# Patient Record
Sex: Male | Born: 1935
Health system: Southern US, Community
[De-identification: ages and names within clinical notes are randomized; demographics above are authoritative.]

## PROBLEM LIST (undated history)

## (undated) DIAGNOSIS — Z9289 Personal history of other medical treatment: Secondary | ICD-10-CM

## (undated) DIAGNOSIS — H3563 Retinal hemorrhage, bilateral: Secondary | ICD-10-CM

## (undated) DIAGNOSIS — I5032 Chronic diastolic (congestive) heart failure: Secondary | ICD-10-CM

## (undated) DIAGNOSIS — C61 Malignant neoplasm of prostate: Secondary | ICD-10-CM

## (undated) DIAGNOSIS — H269 Unspecified cataract: Secondary | ICD-10-CM

## (undated) DIAGNOSIS — N183 Chronic kidney disease, stage 3 unspecified: Secondary | ICD-10-CM

## (undated) DIAGNOSIS — N3040 Irradiation cystitis without hematuria: Secondary | ICD-10-CM

## (undated) DIAGNOSIS — I4821 Permanent atrial fibrillation: Secondary | ICD-10-CM

## (undated) DIAGNOSIS — M1990 Unspecified osteoarthritis, unspecified site: Secondary | ICD-10-CM

## (undated) DIAGNOSIS — I10 Essential (primary) hypertension: Secondary | ICD-10-CM

## (undated) DIAGNOSIS — I82409 Acute embolism and thrombosis of unspecified deep veins of unspecified lower extremity: Secondary | ICD-10-CM

## (undated) DIAGNOSIS — R58 Hemorrhage, not elsewhere classified: Secondary | ICD-10-CM

## (undated) DIAGNOSIS — J189 Pneumonia, unspecified organism: Secondary | ICD-10-CM

## (undated) DIAGNOSIS — D649 Anemia, unspecified: Secondary | ICD-10-CM

## (undated) HISTORY — DX: Chronic diastolic (congestive) heart failure: I50.32

## (undated) HISTORY — PX: TONSILLECTOMY: SUR1361

## (undated) HISTORY — PX: INGUINAL HERNIA REPAIR: SUR1180

## (undated) HISTORY — DX: Permanent atrial fibrillation: I48.21

## (undated) HISTORY — DX: Retinal hemorrhage, bilateral: H35.63

## (undated) HISTORY — PX: PROSTATECTOMY: SHX69

## (undated) HISTORY — DX: Unspecified osteoarthritis, unspecified site: M19.90

## (undated) HISTORY — PX: FRACTURE SURGERY: SHX138

## (undated) HISTORY — DX: Unspecified cataract: H26.9

## (undated) HISTORY — DX: Essential (primary) hypertension: I10

## (undated) HISTORY — DX: Irradiation cystitis without hematuria: N30.40

---

## 2012-06-18 HISTORY — PX: TIBIA FRACTURE SURGERY: SHX806

## 2013-09-29 DIAGNOSIS — D61818 Other pancytopenia: Secondary | ICD-10-CM | POA: Diagnosis not present

## 2013-09-29 DIAGNOSIS — Z79899 Other long term (current) drug therapy: Secondary | ICD-10-CM | POA: Diagnosis not present

## 2013-09-29 DIAGNOSIS — Z5181 Encounter for therapeutic drug level monitoring: Secondary | ICD-10-CM | POA: Diagnosis not present

## 2013-10-06 DIAGNOSIS — E559 Vitamin D deficiency, unspecified: Secondary | ICD-10-CM | POA: Diagnosis not present

## 2013-10-06 DIAGNOSIS — I10 Essential (primary) hypertension: Secondary | ICD-10-CM | POA: Diagnosis not present

## 2013-10-06 DIAGNOSIS — E7800 Pure hypercholesterolemia, unspecified: Secondary | ICD-10-CM | POA: Diagnosis not present

## 2013-10-06 DIAGNOSIS — R5383 Other fatigue: Secondary | ICD-10-CM | POA: Diagnosis not present

## 2013-10-06 DIAGNOSIS — R110 Nausea: Secondary | ICD-10-CM | POA: Diagnosis not present

## 2013-10-06 DIAGNOSIS — E538 Deficiency of other specified B group vitamins: Secondary | ICD-10-CM | POA: Diagnosis not present

## 2013-10-06 DIAGNOSIS — R5381 Other malaise: Secondary | ICD-10-CM | POA: Diagnosis not present

## 2013-10-10 DIAGNOSIS — N179 Acute kidney failure, unspecified: Secondary | ICD-10-CM | POA: Diagnosis not present

## 2013-10-10 DIAGNOSIS — R7309 Other abnormal glucose: Secondary | ICD-10-CM | POA: Diagnosis not present

## 2013-10-18 DIAGNOSIS — N179 Acute kidney failure, unspecified: Secondary | ICD-10-CM | POA: Diagnosis not present

## 2013-10-28 DIAGNOSIS — E749 Disorder of carbohydrate metabolism, unspecified: Secondary | ICD-10-CM | POA: Diagnosis not present

## 2013-10-28 DIAGNOSIS — R7309 Other abnormal glucose: Secondary | ICD-10-CM | POA: Diagnosis not present

## 2013-10-28 DIAGNOSIS — I10 Essential (primary) hypertension: Secondary | ICD-10-CM | POA: Diagnosis not present

## 2013-10-28 DIAGNOSIS — N179 Acute kidney failure, unspecified: Secondary | ICD-10-CM | POA: Diagnosis not present

## 2013-11-15 DIAGNOSIS — C61 Malignant neoplasm of prostate: Secondary | ICD-10-CM | POA: Diagnosis not present

## 2013-11-30 DIAGNOSIS — Z23 Encounter for immunization: Secondary | ICD-10-CM | POA: Diagnosis not present

## 2013-11-30 DIAGNOSIS — N179 Acute kidney failure, unspecified: Secondary | ICD-10-CM | POA: Diagnosis not present

## 2013-11-30 DIAGNOSIS — IMO0001 Reserved for inherently not codable concepts without codable children: Secondary | ICD-10-CM | POA: Diagnosis not present

## 2014-02-03 DIAGNOSIS — I10 Essential (primary) hypertension: Secondary | ICD-10-CM | POA: Diagnosis not present

## 2014-02-03 DIAGNOSIS — E7800 Pure hypercholesterolemia, unspecified: Secondary | ICD-10-CM | POA: Diagnosis not present

## 2014-02-03 DIAGNOSIS — E749 Disorder of carbohydrate metabolism, unspecified: Secondary | ICD-10-CM | POA: Diagnosis not present

## 2014-02-03 DIAGNOSIS — R7309 Other abnormal glucose: Secondary | ICD-10-CM | POA: Diagnosis not present

## 2014-02-03 DIAGNOSIS — R7989 Other specified abnormal findings of blood chemistry: Secondary | ICD-10-CM | POA: Diagnosis not present

## 2014-03-15 DIAGNOSIS — C61 Malignant neoplasm of prostate: Secondary | ICD-10-CM | POA: Diagnosis not present

## 2014-03-20 DIAGNOSIS — I10 Essential (primary) hypertension: Secondary | ICD-10-CM | POA: Diagnosis not present

## 2014-04-07 DIAGNOSIS — I10 Essential (primary) hypertension: Secondary | ICD-10-CM | POA: Diagnosis not present

## 2014-04-10 DIAGNOSIS — N1330 Unspecified hydronephrosis: Secondary | ICD-10-CM | POA: Diagnosis not present

## 2014-04-28 DIAGNOSIS — I10 Essential (primary) hypertension: Secondary | ICD-10-CM | POA: Diagnosis not present

## 2014-05-31 DIAGNOSIS — R799 Abnormal finding of blood chemistry, unspecified: Secondary | ICD-10-CM | POA: Diagnosis not present

## 2014-05-31 DIAGNOSIS — E749 Disorder of carbohydrate metabolism, unspecified: Secondary | ICD-10-CM | POA: Diagnosis not present

## 2014-06-05 DIAGNOSIS — E780 Pure hypercholesterolemia: Secondary | ICD-10-CM | POA: Diagnosis not present

## 2014-06-06 DIAGNOSIS — Z23 Encounter for immunization: Secondary | ICD-10-CM | POA: Diagnosis not present

## 2014-06-12 DIAGNOSIS — I359 Nonrheumatic aortic valve disorder, unspecified: Secondary | ICD-10-CM | POA: Diagnosis not present

## 2014-07-06 DIAGNOSIS — H2513 Age-related nuclear cataract, bilateral: Secondary | ICD-10-CM | POA: Diagnosis not present

## 2014-07-20 DIAGNOSIS — C61 Malignant neoplasm of prostate: Secondary | ICD-10-CM | POA: Diagnosis not present

## 2014-10-09 DIAGNOSIS — D563 Thalassemia minor: Secondary | ICD-10-CM | POA: Diagnosis not present

## 2014-10-09 DIAGNOSIS — D61818 Other pancytopenia: Secondary | ICD-10-CM | POA: Diagnosis not present

## 2014-10-09 DIAGNOSIS — Z79899 Other long term (current) drug therapy: Secondary | ICD-10-CM | POA: Diagnosis not present

## 2014-12-04 DIAGNOSIS — E559 Vitamin D deficiency, unspecified: Secondary | ICD-10-CM | POA: Diagnosis not present

## 2014-12-04 DIAGNOSIS — E780 Pure hypercholesterolemia: Secondary | ICD-10-CM | POA: Diagnosis not present

## 2014-12-04 DIAGNOSIS — R7309 Other abnormal glucose: Secondary | ICD-10-CM | POA: Diagnosis not present

## 2014-12-04 DIAGNOSIS — N1330 Unspecified hydronephrosis: Secondary | ICD-10-CM | POA: Diagnosis not present

## 2014-12-05 DIAGNOSIS — N189 Chronic kidney disease, unspecified: Secondary | ICD-10-CM | POA: Diagnosis not present

## 2014-12-05 DIAGNOSIS — E780 Pure hypercholesterolemia: Secondary | ICD-10-CM | POA: Diagnosis not present

## 2014-12-05 DIAGNOSIS — C61 Malignant neoplasm of prostate: Secondary | ICD-10-CM | POA: Diagnosis not present

## 2014-12-22 DIAGNOSIS — C61 Malignant neoplasm of prostate: Secondary | ICD-10-CM | POA: Diagnosis not present

## 2015-01-01 DIAGNOSIS — H2513 Age-related nuclear cataract, bilateral: Secondary | ICD-10-CM | POA: Diagnosis not present

## 2015-02-12 DIAGNOSIS — I10 Essential (primary) hypertension: Secondary | ICD-10-CM | POA: Diagnosis not present

## 2015-05-06 DIAGNOSIS — Z23 Encounter for immunization: Secondary | ICD-10-CM | POA: Diagnosis not present

## 2015-07-19 DIAGNOSIS — C61 Malignant neoplasm of prostate: Secondary | ICD-10-CM | POA: Diagnosis not present

## 2015-09-25 ENCOUNTER — Encounter: Payer: Self-pay | Admitting: Internal Medicine

## 2015-09-25 ENCOUNTER — Ambulatory Visit (INDEPENDENT_AMBULATORY_CARE_PROVIDER_SITE_OTHER): Payer: Medicare Other | Admitting: Internal Medicine

## 2015-09-25 VITALS — BP 174/82 | HR 55 | Temp 97.7°F | Resp 16 | Ht 63.0 in | Wt 129.0 lb

## 2015-09-25 DIAGNOSIS — I10 Essential (primary) hypertension: Secondary | ICD-10-CM

## 2015-09-25 DIAGNOSIS — Z86718 Personal history of other venous thrombosis and embolism: Secondary | ICD-10-CM | POA: Diagnosis not present

## 2015-09-25 DIAGNOSIS — M4120 Other idiopathic scoliosis, site unspecified: Secondary | ICD-10-CM

## 2015-09-25 DIAGNOSIS — Z8546 Personal history of malignant neoplasm of prostate: Secondary | ICD-10-CM | POA: Diagnosis not present

## 2015-09-25 DIAGNOSIS — R011 Cardiac murmur, unspecified: Secondary | ICD-10-CM | POA: Insufficient documentation

## 2015-09-25 MED ORDER — LISINOPRIL 5 MG PO TABS: 5.0000 mg | ORAL_TABLET | Freq: Every day | ORAL | Status: DC

## 2015-09-25 MED ORDER — LUMBAR BACK BRACE/SUPPORT PAD MISC: Status: DC

## 2015-09-25 MED ORDER — AMLODIPINE BESYLATE 10 MG PO TABS: 10.0000 mg | ORAL_TABLET | Freq: Every day | ORAL | Status: DC

## 2015-09-25 NOTE — Patient Instructions (Addendum)
  It was nice to meet you.   A prescription was given for a back brace - you can take it to a medical supply store.  If you need a different type of prescription please let me know.   Medications reviewed and updated.  No changes recommended at this time.  Your prescription(s) have been submitted to your pharmacy. Please take as directed and contact our office if you believe you are having problem(s) with the medication(s).   Please schedule followup in 6 months

## 2015-09-25 NOTE — Progress Notes (Signed)
Subjective:    Patient ID: Johnny Morrow, male    DOB: [birth year], 82 y.o.   MRN: [id number]  HPI He is here to establish with a new pcp.    Scoliosis:  He has scoliosis in the mid-lower back.  It bothers him on occasion.  He wonders about if a back brace would help.  He only has occasional pain.  He typically works out regularly at [employer] and this does help, but still has occasional back pain and is looking for some relief. He understands he may not be able to use this regularly.  Hypertension: He is taking his medication daily. He is compliant with a low sodium diet.  He denies chest pain, palpitations, edema, shortness of breath and regular headaches. He is exercising regularly.  He does monitor his blood pressure at home and his blood pressure is typically well controlled. He had a long drive this morning and he believes that's why his blood pressure is elevated..     Medications and allergies reviewed with patient and updated if appropriate.  Patient Active Problem List   Diagnosis Date Noted  . Essential hypertension, benign 09/25/2015  . History of prostate cancer 09/25/2015  . Idiopathic scoliosis, thoracic 09/25/2015  . History of DVT of lower extremity, left 09/25/2015  . Undiagnosed cardiac murmurs 09/25/2015    No current outpatient prescriptions on file prior to visit.   No current facility-administered medications on file prior to visit.    Past Medical History  Diagnosis Date  . Cancer ([place])   . Hypertension     Past Surgical History  Procedure Laterality Date  . Hernia repair      Social History   Social History  . Marital Status: Married    Spouse Name: N/A  . Number of Children: N/A  . Years of Education: N/A   Social History Main Topics  . Smoking status: Former Research scientist (life sciences)  . Smokeless tobacco: Never Used  . Alcohol Use: Yes  . Drug Use: No  . Sexual Activity: Not Asked   Other Topics Concern  . None   Social History Narrative  . None     Family History  Problem Relation Age of Onset  . Cancer Father     Review of Systems  Constitutional: Negative for fever and chills.  HENT: Negative for hearing loss.   Eyes: Negative for visual disturbance.  Respiratory: Negative for cough, shortness of breath and wheezing.   Cardiovascular: Negative for chest pain, palpitations and leg swelling.  Gastrointestinal: Negative for nausea, abdominal pain, diarrhea, constipation and blood in stool.  Musculoskeletal: Positive for back pain (occasional).  Neurological: Negative for dizziness, light-headedness and headaches.  Psychiatric/Behavioral: Negative for dysphoric mood. The patient is not nervous/anxious.        Objective:   Filed Vitals:   09/25/15 0844  BP: 174/82  Pulse: 55  Temp: 97.7 F (36.5 C)  Resp: 16   Filed Weights   09/25/15 0844  Weight: 129 lb (58.514 kg)   Body mass index is 22.86 kg/(m^2).   Physical Exam Constitutional: He appears well-developed and well-nourished. No distress.  HENT:  Head: Normocephalic and atraumatic.  Right Ear: External ear normal.  Left Ear: External ear normal.  Mouth/Throat: Oropharynx is clear and moist.  Excessive wax left ear canal, TM unable to visualized, right ear canal and TM normal Eyes: Conjunctivae and EOM are normal.  Neck: Neck supple. No tracheal deviation present. No thyromegaly present.  No carotid bruit  Cardiovascular: Normal rate, regular rhythm, normal heart sounds and intact distal pulses.   3/6 systolic murmur and 2/6 diastolic murmur. Pulmonary/Chest: Effort normal and breath sounds normal. No respiratory distress. He has no wheezes. He has no rales.  Abdominal: Soft. Bowel sounds are normal. He exhibits no distension. There is no tenderness.  Genitourinary:  deferred  Musculoskeletal: He exhibits no edema. thoracic spine scoliosis Lymphadenopathy:    He has no cervical adenopathy.  Skin: Skin is warm and dry. He is not diaphoretic.   Psychiatric: He has a normal mood and affect. His behavior is normal.      Assessment & Plan:   See Problem List for Assessment and Plan of chronic medical problems.  Follow-up in 6 months, sooner if needed

## 2015-09-25 NOTE — Assessment & Plan Note (Signed)
We'll request his previous medical records-he states he has had an echocardiogram in the past

## 2015-09-25 NOTE — Assessment & Plan Note (Signed)
Thoracic scoliosis on exam He states his lower back pain is in the lumbar region Given his age and a back brace would be okay as long as he uses it periodically-he willnot use this on a regular basis because it may weaken his back muscles

## 2015-09-25 NOTE — Assessment & Plan Note (Signed)
Blood pressure typically controlled per patient We'll continue current medication-prescription sent to pharmacy Stressed that he needs to monitor his blood pressure and confirmed that it is well controlled

## 2015-09-25 NOTE — Progress Notes (Signed)
Pre visit review using our clinic review tool, if applicable. No additional management support is needed unless otherwise documented below in the visit note. 

## 2015-09-25 NOTE — Assessment & Plan Note (Signed)
Following with urology

## 2015-09-25 NOTE — Assessment & Plan Note (Signed)
Wears compression stockings daily

## 2015-10-11 DIAGNOSIS — I10 Essential (primary) hypertension: Secondary | ICD-10-CM | POA: Diagnosis not present

## 2015-10-11 DIAGNOSIS — Z7901 Long term (current) use of anticoagulants: Secondary | ICD-10-CM | POA: Diagnosis not present

## 2015-10-11 DIAGNOSIS — R799 Abnormal finding of blood chemistry, unspecified: Secondary | ICD-10-CM | POA: Diagnosis not present

## 2015-10-11 DIAGNOSIS — D72819 Decreased white blood cell count, unspecified: Secondary | ICD-10-CM | POA: Diagnosis not present

## 2015-10-11 DIAGNOSIS — N1330 Unspecified hydronephrosis: Secondary | ICD-10-CM | POA: Diagnosis not present

## 2015-10-11 DIAGNOSIS — Z8546 Personal history of malignant neoplasm of prostate: Secondary | ICD-10-CM | POA: Diagnosis not present

## 2015-10-11 DIAGNOSIS — D61818 Other pancytopenia: Secondary | ICD-10-CM | POA: Diagnosis not present

## 2015-10-22 ENCOUNTER — Telehealth: Payer: Self-pay | Admitting: Internal Medicine

## 2015-10-22 NOTE — Telephone Encounter (Signed)
Rec'd from [facility] forward 63 pages to Dr. Quay Burow

## 2015-12-05 DIAGNOSIS — C61 Malignant neoplasm of prostate: Secondary | ICD-10-CM | POA: Diagnosis not present

## 2015-12-12 ENCOUNTER — Ambulatory Visit (INDEPENDENT_AMBULATORY_CARE_PROVIDER_SITE_OTHER): Payer: Medicare Other | Admitting: Family Medicine

## 2015-12-12 ENCOUNTER — Encounter: Payer: Self-pay | Admitting: Family Medicine

## 2015-12-12 ENCOUNTER — Ambulatory Visit (INDEPENDENT_AMBULATORY_CARE_PROVIDER_SITE_OTHER)
Admission: RE | Admit: 2015-12-12 | Discharge: 2015-12-12 | Disposition: A | Discharge status: Home / Self Care | Payer: Medicare Other | Source: Ambulatory Visit | Attending: Family Medicine | Admitting: Family Medicine

## 2015-12-12 VITALS — BP 164/90 | HR 87 | Temp 97.8°F | Resp 12 | Wt 127.4 lb

## 2015-12-12 DIAGNOSIS — I10 Essential (primary) hypertension: Secondary | ICD-10-CM | POA: Diagnosis not present

## 2015-12-12 DIAGNOSIS — J309 Allergic rhinitis, unspecified: Secondary | ICD-10-CM

## 2015-12-12 DIAGNOSIS — R05 Cough: Secondary | ICD-10-CM | POA: Diagnosis not present

## 2015-12-12 DIAGNOSIS — I499 Cardiac arrhythmia, unspecified: Secondary | ICD-10-CM

## 2015-12-12 DIAGNOSIS — R059 Cough, unspecified: Secondary | ICD-10-CM

## 2015-12-12 LAB — BASIC METABOLIC PANEL
BUN: 37 mg/dL — ABNORMAL HIGH (ref 6–23)
CHLORIDE: 101 meq/L (ref 96–112)
CO2: 26 mEq/L (ref 19–32)
CREATININE: 2.18 mg/dL — AB (ref 0.40–1.50)
Calcium: 9.5 mg/dL (ref 8.4–10.5)
GFR: 36.45 mL/min — ABNORMAL LOW (ref >60.00)
Glucose, Bld: 132 mg/dL — ABNORMAL HIGH (ref 70–99)
POTASSIUM: 4 meq/L (ref 3.5–5.1)
SODIUM: 136 meq/L (ref 135–145)

## 2015-12-12 LAB — TSH: TSH: 2.53 u[IU]/mL (ref 0.35–4.50)

## 2015-12-12 MED ORDER — FLUTICASONE PROPIONATE 50 MCG/ACT NA SUSP
1.0000 | Freq: Two times a day (BID) | NASAL | Status: DC
Start: 2015-12-12 — End: 2016-03-25

## 2015-12-12 NOTE — Progress Notes (Addendum)
Subjective:    Patient ID: Johnny Morrow, male    DOB: Nov 01, 1919, 82 y.o.   MRN: [id number]  HPI  Mr. Johnny Morrow is a 82 y.o.male here today with his wife complaining of 3 weeks of cough, denies any other respiratory symptom. He has not had recent URI, fever, chills, or myalgias. + Former smoker, has not noted wheezing or dyspnea. Cough is described as non productive, intermittent, he has not identified exacerbating factors.  No Hx of recent travel. No sick contact. No known insect bite. + Hx of allergies,tried OTC antihistaminic for a few days and helped some. No Hx of GERD and has not noted heartburn. Has been on ACEI for about 2 years, Lisinopril.   He has tried honey and lemon tea. Symptom getting worse.   Hypertension: His BP is elevated today. Currently he is on Amlodipine 10 mg and Lisinopril 5 mg. He has not taken his medications today.   He has not noted unusual headache, visual changes, exertional chest pain, dyspnea,  focal weakness, or edema. Denies orthopnea, sleeps on a pillow; or PND.  Hx of "heart murmur", reporting cardiology evaluation 1-2 years ago. No Hx of irregular HR, which I noted today on exam.   Review of Systems  Constitutional: Negative for fever, chills, diaphoresis, activity change, appetite change and fatigue.  HENT: Positive for congestion and rhinorrhea. Negative for ear pain, mouth sores, nosebleeds, postnasal drip, sneezing, sore throat, trouble swallowing and voice change.   Eyes: Negative for discharge, redness, itching and visual disturbance.  Respiratory: Positive for cough. Negative for chest tightness, shortness of breath and wheezing.   Cardiovascular: Negative for chest pain, palpitations and leg swelling.  Gastrointestinal: Negative for nausea, vomiting and abdominal pain.       No changes in bowel habits.  Endocrine: Negative for cold intolerance and heat intolerance.  Genitourinary: Negative for hematuria and decreased urine  volume.       Ocassional nocturia, attributed to increased fluid intake lately.  Musculoskeletal: Negative for back pain, gait problem and neck pain.  Skin: Negative for rash.  Allergic/Immunologic: Positive for environmental allergies.  Neurological: Negative for syncope, speech difficulty, weakness, numbness and headaches.  Psychiatric/Behavioral: Negative for confusion. The patient is not nervous/anxious.      Current Outpatient Prescriptions on File Prior to Visit  Medication Sig Dispense Refill  . amLODipine (NORVASC) 10 MG tablet Take 1 tablet (10 mg total) by mouth daily. 90 tablet 3  . B Complex Vitamins (VITAMIN-B COMPLEX PO) Take by mouth.    . Bacillus Coagulans-Inulin (PROBIOTIC FORMULA) 1-250 BILLION-MG CAPS Take 250 mg by mouth daily.    . Cholecalciferol (VITAMIN D3) 5000 units TABS Take by mouth daily.    . Coenzyme Q10 (CO Q 10) 100 MG CAPS Take 100 mg by mouth daily.    Water engineer Bandages & Supports (LUMBAR BACK BRACE/SUPPORT PAD) MISC Use as directed for lumbar scoliosis 1 each 0  . lisinopril (PRINIVIL,ZESTRIL) 5 MG tablet Take 1 tablet (5 mg total) by mouth daily. 90 tablet 3  . Multiple Vitamin (MULTI VITAMIN PO) Take by mouth daily.    . Omega 3 1000 MG CAPS Take 2,000 mg by mouth daily.     No current facility-administered medications on file prior to visit.     Past Medical History  Diagnosis Date  . Cancer ([place])   . Hypertension     Social History   Social History  . Marital Status: Married    Spouse Name: N/A  .  Number of Children: N/A  . Years of Education: N/A   Social History Main Topics  . Smoking status: Former Research scientist (life sciences)  . Smokeless tobacco: Never Used  . Alcohol Use: Yes  . Drug Use: No  . Sexual Activity: Not Asked   Other Topics Concern  . None   Social History Narrative    Filed Vitals:   12/12/15 1108  BP: 164/90  Pulse: 87  Temp: 97.8 F (36.6 C)  Resp: 12   Body mass index is 22.57 kg/(m^2).  SpO2 Readings from Last 3  Encounters:  12/12/15 96%  09/25/15 97%        Objective:   Physical Exam  Constitutional: He is oriented to person, place, and time. He appears well-developed and well-nourished. He does not appear ill. No distress.  HENT:  Head: Normocephalic and atraumatic.  Left Ear: Tympanic membrane and ear canal normal.  Nose: Rhinorrhea and septal deviation present. No sinus tenderness.  Right ear: excess cerumen, cannot visualized TM. Hypertrophic turbinates, nasal voice.  Eyes: Conjunctivae and EOM are normal. Pupils are equal, round, and reactive to light.  Neck: Hepatojugular reflux present. No JVD present.  Cardiovascular: Normal rate.  An irregular rhythm present.  Occasional extrasystoles are present.  Murmur heard. SEM II-III/VI RUSB and apex. PT pulses present bilateral. Peri ankle pitting edema, trace, bilateral.  Pulmonary/Chest: Effort normal. No respiratory distress. He has no decreased breath sounds. He has no wheezes. He has no rales.  Abdominal: Soft. He exhibits no mass. There is no tenderness.  Musculoskeletal: He exhibits edema (peri ankle, trace). He exhibits no tenderness.  Lymphadenopathy:    He has no cervical adenopathy.  Neurological: He is alert and oriented to person, place, and time. He has normal strength. No cranial nerve deficit. Coordination normal.  Slow otherwise stable gait but some unbalance noted when he first gets up; no assistance.  Skin: Skin is warm. No rash noted. No erythema.  Psychiatric: He has a normal mood and affect. His speech is normal.  Well groomed and good eye contact.  Nursing note and vitals reviewed.     Assessment & Plan:    Favor was seen today for cough.  Diagnoses and all orders for this visit:  Cough  Possible causes discussed. For now no changes in Lisinopril but may need to be discontinued if cough persists. I will treat as allergic etiology initially. Because 3 weeks of cough CXR was ordered.  -     DG Chest 2  View; Future  Essential hypertension, benign  Re-checked 158/92. Reporting home readings 150/80. BP was higher during his last OV. Since he did not take his BP meds today no changes. Possible complications of elevated BP discussed. Clearly instructed about warning signs. F/U in 2 weeks.  -     Basic Metabolic Panel -     Electrocardiogram report -     EKG 12-Lead  Allergic rhinitis, unspecified allergic rhinitis type  Plain Allegra 180 mg daily and Flonase nasal spray recommended. This could be causing and/or aggravating problem.  -     fluticasone (FLONASE) 50 MCG/ACT nasal spray; Place 1 spray into both nostrils 2 (two) times daily.  Irregular heart rate  Reporting no prior Hx. EKG done today, abnormal.Reviewed with Ms and Mr Pascall. Sine he is asymptomatic otherwise I do not think he needs further test today, except for lab work. Reporting Echo done about a year ago. EKG: SR, occasional PAC (1), voltage criteria for LVH with straining pattern, ?  LAE.  RBBB with LAD (possible LAHB). No other EKG available for comparison. Instructed about warning signs, Ms and Mr Steelman voice understanding. F/U in 2 weeks with PCP.  -     Electrocardiogram report -     TSH -     EKG 12-Lead   Lab Results  Component Value Date   CREATININE 2.18* 12/12/2015   BUN 37* 12/12/2015   NA 136 12/12/2015   K 4.0 12/12/2015   CL 101 12/12/2015   CO2 26 12/12/2015   Lab Results  Component Value Date   TSH 2.53 12/12/2015     -Patient advised to return or notify a doctor immediately if symptoms worsen or persist or new concerns arise.    Addendum: I reviewed some records after OV: EKG in 01/2003 NSR, RBBB,LVH,and ? LAE. 01/2003 Echo: normal LVEF, no LVH,mild AR and TR, trivial MR and PR. 09/2012 CMP: GLU 172,Cr 1.2, and AST 50; rest otherwise normal.    Abbye Lao G. Martinique, MD  [facility]. [place] office.

## 2015-12-12 NOTE — Patient Instructions (Addendum)
A few things to remember from today's visit:   1. Essential hypertension, benign  Poorly controlled.  2. Cough Possible causes: cold, allergies, acid reflux, COPD, heart and lung disease, and medications like Lisinopril.   3. Allergic rhinitis, unspecified allergic rhinitis type I will treat as allergies. Allegra 180 mg daily (plain) and Flonase.   Irregular heart Rate: EKG done today is abnormal but base on history today I do not think it is something acute going on, still you need to go to ER if any chest pain, shortness or breath, dizziness, confusion, or palpitations.  Follow with you pcp in 2 weeks.      If you sign-up for My chart, you can communicate easier with [place] in case you have any question or concern.

## 2015-12-18 ENCOUNTER — Other Ambulatory Visit (INDEPENDENT_AMBULATORY_CARE_PROVIDER_SITE_OTHER): Payer: Medicare Other

## 2015-12-18 DIAGNOSIS — E119 Type 2 diabetes mellitus without complications: Secondary | ICD-10-CM

## 2015-12-18 LAB — HEMOGLOBIN A1C: HEMOGLOBIN A1C: 6.2 % (ref 4.6–6.5)

## 2015-12-19 ENCOUNTER — Encounter: Payer: Self-pay | Admitting: Internal Medicine

## 2015-12-19 DIAGNOSIS — R7303 Prediabetes: Secondary | ICD-10-CM | POA: Insufficient documentation

## 2015-12-20 ENCOUNTER — Encounter: Payer: Self-pay | Admitting: Emergency Medicine

## 2015-12-24 ENCOUNTER — Other Ambulatory Visit: Payer: Self-pay | Admitting: Family Medicine

## 2015-12-24 ENCOUNTER — Encounter: Payer: Self-pay | Admitting: Family Medicine

## 2015-12-24 ENCOUNTER — Ambulatory Visit (INDEPENDENT_AMBULATORY_CARE_PROVIDER_SITE_OTHER): Payer: Medicare Other | Admitting: Family Medicine

## 2015-12-24 ENCOUNTER — Telehealth: Payer: Self-pay | Admitting: Internal Medicine

## 2015-12-24 VITALS — BP 130/80 | HR 86 | Temp 98.1°F | Resp 12 | Ht 63.0 in | Wt 125.5 lb

## 2015-12-24 DIAGNOSIS — N289 Disorder of kidney and ureter, unspecified: Secondary | ICD-10-CM | POA: Diagnosis not present

## 2015-12-24 DIAGNOSIS — J309 Allergic rhinitis, unspecified: Secondary | ICD-10-CM | POA: Diagnosis not present

## 2015-12-24 DIAGNOSIS — I351 Nonrheumatic aortic (valve) insufficiency: Secondary | ICD-10-CM

## 2015-12-24 LAB — BASIC METABOLIC PANEL
BUN: 46 mg/dL — ABNORMAL HIGH (ref 6–23)
CHLORIDE: 105 meq/L (ref 96–112)
CO2: 25 meq/L (ref 19–32)
Calcium: 9.4 mg/dL (ref 8.4–10.5)
Creatinine, Ser: 2.29 mg/dL — ABNORMAL HIGH (ref 0.40–1.50)
GFR: 34.43 mL/min — ABNORMAL LOW (ref >60.00)
GLUCOSE: 122 mg/dL — AB (ref 70–99)
Potassium: 3.9 mEq/L (ref 3.5–5.1)
SODIUM: 138 meq/L (ref 135–145)

## 2015-12-24 LAB — MICROALBUMIN / CREATININE URINE RATIO
CREATININE, U: 155.6 mg/dL
MICROALB UR: 167.3 mg/dL — AB (ref 0.0–1.9)
MICROALB/CREAT RATIO: 107.5 mg/g — AB (ref 0.0–30.0)

## 2015-12-24 NOTE — Patient Instructions (Signed)
A few things to remember from today's visit:   1. Abnormal renal function Lab today. Low salt diet. To preserve kidney function and/or slow progression of disease:  Low salt diet and adequate hydration. Avoiding medicines known as "nonsteroidal anti-inflammatory drugs," or NSAIDs. These medicines include ibuprofen (sample brand names: Advil, Motrin) and naproxen (sample brand name: Aleve).  Adequate blood pressure control.    - Basic Metabolic Panel - Microalbumin/Creatinine Ratio, Urine  2. Allergic rhinitis, unspecified allergic rhinitis type Continue Allegra and Flonase   3. Aortic valve regurgitation Echo to be arrange. No changes in blood pressure medication.  - Echocardiogram; Future      If you sign-up for My chart, you can communicate easier with [place] in case you have any question or concern.

## 2015-12-24 NOTE — Telephone Encounter (Signed)
Pt was holding and got disconnected not sure if he was to make an appointment for Dr. Martinique or lab appt.   Pt would like a call back.

## 2015-12-24 NOTE — Telephone Encounter (Signed)
Patient should expect call back momentarily from scheduling to schedule appointment

## 2015-12-24 NOTE — Progress Notes (Signed)
Pre visit review using our clinic review tool, if applicable. No additional management support is needed unless otherwise documented below in the visit note. 

## 2015-12-24 NOTE — Progress Notes (Signed)
Subjective:    Patient ID: Johnny Morrow, male    DOB: Nov 27, 1919, 82 y.o.   MRN: [id number]  HPI   Mr. Johnny Morrow is a 82 y.o.male here today with his wife to follow on his last office visit, he would like to go through the lab results and follow on cough. He was seen on April 26/2017 concerned about persistent cough, we discussed possible etiologies and treated as allergy related. Currently he is on Allegra 180 mg daily and Flonase nasal spray, he is reporting improvement of cough, about 60%.  Also during his last office visit I found irregular heart rate, not aware of prior Hx ,EKG and labs done. RBBB,voltage criteria for LVH with straining pattern, and PAC's. BMP abnormal.  His blood pressure is better today, currently On amlodipine 10 mg daily. He is not aware of hx of chronic kidney disease. He denies any foam in the urine, gross hematuria, or decreased urine output.  Denies severe/frequent headache, visual changes, chest pain, dyspnea, palpitation, claudication, focal weakness, or edema.  Today he is reporting that he has followed with cardiologists, last in 2014.  CXR done 12/12/15  1. Severe dextroscoliosis of the thoracolumbar spine. 2. Mild bibasilar curvilinear lung opacities, favor scarring or Atelectasis.   Lab Results  Component Value Date   CREATININE 2.18* 12/12/2015   BUN 37* 12/12/2015   NA 136 12/12/2015   K 4.0 12/12/2015   CL 101 12/12/2015   CO2 26 12/12/2015     Review of Systems  Constitutional: Negative for fever, chills, activity change, appetite change and fatigue.  HENT: Negative for congestion, ear pain, mouth sores, postnasal drip, sneezing, sore throat, trouble swallowing and voice change.   Eyes: Positive for discharge (epiphora). Negative for redness, itching and visual disturbance.  Respiratory: Positive for cough (improved). Negative for chest tightness, shortness of breath and wheezing.   Cardiovascular: Negative for chest pain,  palpitations and leg swelling.  Gastrointestinal: Negative for nausea, vomiting and abdominal pain.       No changes in bowel habits.   Genitourinary: Negative for dysuria, hematuria and decreased urine volume.  Allergic/Immunologic: Positive for environmental allergies.  Neurological: Negative for dizziness, weakness, numbness and headaches.  Psychiatric/Behavioral: Negative for confusion and sleep disturbance. The patient is not nervous/anxious.      Current Outpatient Prescriptions on File Prior to Visit  Medication Sig Dispense Refill  . amLODipine (NORVASC) 10 MG tablet Take 1 tablet (10 mg total) by mouth daily. 90 tablet 3  . B Complex Vitamins (VITAMIN-B COMPLEX PO) Take by mouth.    . Bacillus Coagulans-Inulin (PROBIOTIC FORMULA) 1-250 BILLION-MG CAPS Take 250 mg by mouth daily.    . Cholecalciferol (VITAMIN D3) 5000 units TABS Take by mouth daily.    . Coenzyme Q10 (CO Q 10) 100 MG CAPS Take 100 mg by mouth daily.    Water engineer Bandages & Supports (LUMBAR BACK BRACE/SUPPORT PAD) MISC Use as directed for lumbar scoliosis 1 each 0  . fluticasone (FLONASE) 50 MCG/ACT nasal spray Place 1 spray into both nostrils 2 (two) times daily. 16 g 3  . lisinopril (PRINIVIL,ZESTRIL) 5 MG tablet Take 1 tablet (5 mg total) by mouth daily. 90 tablet 3  . Multiple Vitamin (MULTI VITAMIN PO) Take by mouth daily.    . Omega 3 1000 MG CAPS Take 2,000 mg by mouth daily.     No current facility-administered medications on file prior to visit.     Past Medical History  Diagnosis Date  .  Cancer ([place])   . Hypertension     Social History   Social History  . Marital Status: Married    Spouse Name: N/A  . Number of Children: N/A  . Years of Education: N/A   Social History Main Topics  . Smoking status: Former Research scientist (life sciences)  . Smokeless tobacco: Never Used  . Alcohol Use: Yes  . Drug Use: No  . Sexual Activity: Not Asked   Other Topics Concern  . None   Social History Narrative    Filed  Vitals:   12/24/15 0817  BP: 130/80  Pulse: 86  Temp: 98.1 F (36.7 C)  Resp: 12   Body mass index is 22.24 kg/(m^2).      Objective:   Physical Exam  Constitutional: He is oriented to person, place, and time. He appears well-developed and well-nourished. No distress.  HENT:  Head: Atraumatic.  Nose: Rhinorrhea and septal deviation present. Right sinus exhibits no maxillary sinus tenderness and no frontal sinus tenderness. Left sinus exhibits no maxillary sinus tenderness and no frontal sinus tenderness.  Mouth/Throat: Oropharynx is clear and moist and mucous membranes are normal.  Eyes: Conjunctivae are normal.  Cardiovascular: Normal rate.  An irregularly irregular rhythm present.  Extrasystoles (X 1) are present.  Murmur heard. SEM II-VI louder on RUSB but also heard on LUSB and apex.  Pulmonary/Chest: Effort normal and breath sounds normal. He has no wheezes. He has no rales.  Abdominal: Soft. He exhibits no mass. There is no tenderness.  Musculoskeletal: He exhibits no edema.  Lymphadenopathy:    He has no cervical adenopathy.  Neurological: He is alert and oriented to person, place, and time. He has normal strength. Coordination normal.  Stable gait with no assistance.  Skin: Skin is warm. No erythema.  Psychiatric: He has a normal mood and affect. His speech is normal.  Well groomed and good eye contact.  Nursing note and vitals reviewed.      Assessment & Plan:   Johnny Morrow was seen today for follow-up.  Diagnoses and all orders for this visit:  Abnormal renal function  ? CKD III. Continue lisinopril. Good blood pressure control, avoid diabetes (history of prediabetes). Low salt diet and avoid nonsteroidal anti-inflammatory medications. Further recommendations would be given depending on results of today's labs. Follow-up in 3 months.  -     Basic Metabolic Panel -     Microalbumin/Creatinine Ratio, Urine   Allergic rhinitis, unspecified allergic rhinitis  type  Cough has improved, most likely related with allergies. Continue Allegra and Flonase. Nasal saline drops at night might also help. Follow-up as needed.   Aortic valve regurgitation Asymptomatic. Echo will be arranged to follow on valvular abnormalities (AR,MR). Further recommendations will be given according to results.  -     Echocardiogram; Future      -Patient advised to return or notify a doctor immediately if symptoms worsen or persist or new concerns arise.     Latalia Etzler G. Martinique, MD  [facility]. [place] office.

## 2015-12-26 ENCOUNTER — Other Ambulatory Visit: Payer: Self-pay | Admitting: Family Medicine

## 2015-12-26 DIAGNOSIS — N289 Disorder of kidney and ureter, unspecified: Secondary | ICD-10-CM

## 2016-01-11 ENCOUNTER — Encounter: Payer: Self-pay | Admitting: Internal Medicine

## 2016-01-11 ENCOUNTER — Other Ambulatory Visit: Payer: Self-pay

## 2016-01-11 ENCOUNTER — Ambulatory Visit (HOSPITAL_COMMUNITY): Payer: Medicare Other | Attending: Family Medicine

## 2016-01-11 DIAGNOSIS — I371 Nonrheumatic pulmonary valve insufficiency: Secondary | ICD-10-CM | POA: Diagnosis not present

## 2016-01-11 DIAGNOSIS — I071 Rheumatic tricuspid insufficiency: Secondary | ICD-10-CM | POA: Diagnosis not present

## 2016-01-11 DIAGNOSIS — I119 Hypertensive heart disease without heart failure: Secondary | ICD-10-CM | POA: Diagnosis not present

## 2016-01-11 DIAGNOSIS — I352 Nonrheumatic aortic (valve) stenosis with insufficiency: Secondary | ICD-10-CM | POA: Diagnosis not present

## 2016-01-11 DIAGNOSIS — I340 Nonrheumatic mitral (valve) insufficiency: Secondary | ICD-10-CM | POA: Diagnosis not present

## 2016-01-11 DIAGNOSIS — I351 Nonrheumatic aortic (valve) insufficiency: Secondary | ICD-10-CM | POA: Insufficient documentation

## 2016-01-11 DIAGNOSIS — R012 Other cardiac sounds: Secondary | ICD-10-CM | POA: Diagnosis present

## 2016-01-11 DIAGNOSIS — Z87891 Personal history of nicotine dependence: Secondary | ICD-10-CM | POA: Insufficient documentation

## 2016-01-11 DIAGNOSIS — I350 Nonrheumatic aortic (valve) stenosis: Secondary | ICD-10-CM | POA: Insufficient documentation

## 2016-01-11 DIAGNOSIS — M810 Age-related osteoporosis without current pathological fracture: Secondary | ICD-10-CM | POA: Insufficient documentation

## 2016-01-21 ENCOUNTER — Other Ambulatory Visit (INDEPENDENT_AMBULATORY_CARE_PROVIDER_SITE_OTHER): Payer: Medicare Other

## 2016-01-21 ENCOUNTER — Telehealth: Payer: Self-pay

## 2016-01-21 DIAGNOSIS — N289 Disorder of kidney and ureter, unspecified: Secondary | ICD-10-CM | POA: Diagnosis not present

## 2016-01-21 LAB — CBC WITH DIFFERENTIAL/PLATELET
BASOS ABS: 0 10*3/uL (ref 0.0–0.1)
Basophils Relative: 0 % (ref 0.0–3.0)
Eosinophils Absolute: 0.2 10*3/uL (ref 0.0–0.7)
Eosinophils Relative: 5.1 % — ABNORMAL HIGH (ref 0.0–5.0)
HCT: 36.4 % — ABNORMAL LOW (ref 39.0–52.0)
Hemoglobin: 12 g/dL — ABNORMAL LOW (ref 13.0–17.0)
LYMPHS ABS: 1.6 10*3/uL (ref 0.7–4.0)
Lymphocytes Relative: 35.6 % (ref 12.0–46.0)
MCHC: 33 g/dL (ref 30.0–36.0)
MCV: 85.8 fl (ref 78.0–100.0)
MONO ABS: 0.6 10*3/uL (ref 0.1–1.0)
Monocytes Relative: 14 % — ABNORMAL HIGH (ref 3.0–12.0)
NEUTROS PCT: 45.3 % (ref 43.0–77.0)
Neutro Abs: 2.1 10*3/uL (ref 1.4–7.7)
Platelets: 151 10*3/uL (ref 150.0–400.0)
RBC: 4.24 Mil/uL (ref 4.22–5.81)
RDW: 15.6 % — ABNORMAL HIGH (ref 11.5–15.5)
WBC: 4.6 10*3/uL (ref 4.0–10.5)

## 2016-01-21 NOTE — Telephone Encounter (Signed)
Spoke to patient. Gave results. Patient verbalized understanding. 

## 2016-01-21 NOTE — Telephone Encounter (Signed)
-----   Message from Betty G Martinique, MD sent at 01/21/2016  1:25 PM EDT ----- CBC back, no significant abnormalities. Slight anemia, no medication is recommended at this time.  Renal function still pending. Keep f/u appt as planned, 3 months from last OV, before if needed. Thanks!

## 2016-03-25 ENCOUNTER — Encounter: Payer: Self-pay | Admitting: Family Medicine

## 2016-03-25 ENCOUNTER — Ambulatory Visit (INDEPENDENT_AMBULATORY_CARE_PROVIDER_SITE_OTHER): Payer: Medicare Other | Admitting: Family Medicine

## 2016-03-25 VITALS — BP 160/80 | HR 77 | Resp 12 | Ht 63.0 in | Wt 125.4 lb

## 2016-03-25 DIAGNOSIS — N183 Chronic kidney disease, stage 3 unspecified: Secondary | ICD-10-CM

## 2016-03-25 DIAGNOSIS — R05 Cough: Secondary | ICD-10-CM

## 2016-03-25 DIAGNOSIS — I10 Essential (primary) hypertension: Secondary | ICD-10-CM | POA: Diagnosis not present

## 2016-03-25 DIAGNOSIS — R059 Cough, unspecified: Secondary | ICD-10-CM

## 2016-03-25 LAB — BASIC METABOLIC PANEL
BUN: 33 mg/dL — AB (ref 6–23)
CHLORIDE: 107 meq/L (ref 96–112)
CO2: 24 mEq/L (ref 19–32)
Calcium: 9 mg/dL (ref 8.4–10.5)
Creatinine, Ser: 2.25 mg/dL — ABNORMAL HIGH (ref 0.40–1.50)
GFR: 35.12 mL/min — AB (ref >60.00)
Glucose, Bld: 124 mg/dL — ABNORMAL HIGH (ref 70–99)
POTASSIUM: 3.6 meq/L (ref 3.5–5.1)
SODIUM: 139 meq/L (ref 135–145)

## 2016-03-25 LAB — MICROALBUMIN / CREATININE URINE RATIO
CREATININE, U: 98.2 mg/dL
MICROALB/CREAT RATIO: 209.9 mg/g — AB (ref 0.0–30.0)
Microalb, Ur: 206.1 mg/dL — ABNORMAL HIGH (ref 0.0–1.9)

## 2016-03-25 LAB — VITAMIN D 25 HYDROXY (VIT D DEFICIENCY, FRACTURES): VITD: 61.78 ng/mL (ref 30.00–100.00)

## 2016-03-25 NOTE — Patient Instructions (Addendum)
A few things to remember from today's visit:   CKD (chronic kidney disease), stage III - Plan: Basic Metabolic Panel, VITAMIN D 25 Hydroxy (Vit-D Deficiency, Fractures), Microalbumin/Creatinine Ratio, Urine  Essential hypertension, benign  Because reporting normal blood pressure at home, no changes today, goal is less than 150/90, ideally < 140/90. Low salt diet.  If you change your mind about repeating chest X ray or starting inhalers for lung please let me know.  Also remember asking your daughter to sign for my chart as you told me you will do.  To preserve kidney function and/or slow progression of disease:  Low salt diet and adequate hydration. Avoiding medicines known as "nonsteroidal anti-inflammatory drugs," or NSAIDs. These medicines include ibuprofen (sample brand names: Advil, Motrin) and naproxen (sample brand name: Aleve).  Adequate blood pressure control. Low phosphorus diet.   No changes today.     Medicare covers a annual preventive visit, which is strongly recommended , it is once per year and involves a series of questions to identify risk factors; so we can try to prevent possible complications. This does not need to be done by a doctor.  We have a nurse Investment banker, corporate) here that is highly qualified to do it, it can be arrange same date you have a follow up appointment with me or labs scheduled, and it 100% covered by Medicare. So before you leave today I would like for you to arrange visit with Ms Wynetta Fines for Medicare wellness visit.  Please be sure medication list is accurate. If a new problem present, please set up appointment sooner than planned today.

## 2016-03-25 NOTE — Progress Notes (Signed)
HPI:   Johnny Morrow is a 82 y.o. male, who is here today to follow on HTN and some other chronic problems.    I first met Johnny Morrow on 12/12/15, when he established care with me. At that time he was c/o non productive cough and post nasla drainage. CXR 12/12/15:1. Severe dextroscoliosis of the thoracolumbar spine.  2. Mild bibasilar curvilinear lung opacities, favor scarring or atelectasis.  Former smoker. Still coughing some. He did not cough "at all" while in [place] x 3 days. "Little" wheeze yesterday, no Hx of asthma. No dyspnea or chest pain.  He used Flonase nasal spray and helped some. A few days ago he noted some mild wheezing. Denies dyspnea or chest pain.     Hypertension:  Currently he is on Amlodipine 10 mg daily. . Exercising and following a healthy/low salt diet. Home BP's: 130-140/80   He has not noted unusual headache, visual changes, exertional chest pain,dyspnea, focal weakness, or edema. Tolerating medications well with no major side effects reported.   Lab Results  Component Value Date   CREATININE 2.29 (H) 12/24/2015   BUN 46 (H) 12/24/2015   NA 138 12/24/2015   K 3.9 12/24/2015   CL 105 12/24/2015   CO2 25 12/24/2015   CKD III. He has not noted gross hematuria or foam in urine. No changes in urinary frequency. + Nocturia, stable for years, he used to be on Flomax, not interested in resuming.   Occasionally he takes Aleve. Lisinopril discontinued in 12/2015 because cough.    Review of Systems  Constitutional: Negative for activity change, appetite change, chills, fatigue and fever.  HENT: Positive for postnasal drip and rhinorrhea. Negative for ear pain, mouth sores, sneezing, sore throat, trouble swallowing and voice change.   Eyes: Negative for discharge, redness, itching and visual disturbance.  Respiratory: Positive for cough (improved). Negative for chest tightness, shortness of breath and wheezing.   Cardiovascular:  Negative for chest pain, palpitations and leg swelling.  Gastrointestinal: Negative for abdominal pain, nausea and vomiting.       No changes in bowel habits.   Genitourinary: Negative for decreased urine volume, difficulty urinating and hematuria.       Nocturia up to 3 times, stable.  Allergic/Immunologic: Positive for environmental allergies.  Neurological: Negative for dizziness, weakness, numbness and headaches.  Psychiatric/Behavioral: Negative for confusion and sleep disturbance. The patient is not nervous/anxious.       Current Outpatient Prescriptions on File Prior to Visit  Medication Sig Dispense Refill  . amLODipine (NORVASC) 10 MG tablet Take 1 tablet (10 mg total) by mouth daily. 90 tablet 3  . B Complex Vitamins (VITAMIN-B COMPLEX PO) Take by mouth.    . Bacillus Coagulans-Inulin (PROBIOTIC FORMULA) 1-250 BILLION-MG CAPS Take 250 mg by mouth daily.    . Cholecalciferol (VITAMIN D3) 5000 units TABS Take by mouth daily.    . Coenzyme Q10 (CO Q 10) 100 MG CAPS Take 100 mg by mouth daily.    Water engineer Bandages & Supports (LUMBAR BACK BRACE/SUPPORT PAD) MISC Use as directed for lumbar scoliosis 1 each 0  . Multiple Vitamin (MULTI VITAMIN PO) Take by mouth daily.    . Omega 3 1000 MG CAPS Take 2,000 mg by mouth daily.     No current facility-administered medications on file prior to visit.      Past Medical History:  Diagnosis Date  . Cancer ([place])   . Hypertension    Allergies  Allergen Reactions  .  Penicillins Rash    Social History   Social History  . Marital status: Married    Spouse name: N/A  . Number of children: N/A  . Years of education: N/A   Social History Main Topics  . Smoking status: Former Research scientist (life sciences)  . Smokeless tobacco: Never Used  . Alcohol use Yes  . Drug use: No  . Sexual activity: Not Asked   Other Topics Concern  . None   Social History Narrative  . None    Vitals:   03/25/16 0759  BP: (!) 160/80  Pulse: 77  Resp: 12   Body  mass index is 22.21 kg/m.    O2 sat at RA 96%   Physical Exam  Constitutional: He is oriented to person, place, and time. He appears well-developed. No distress.  HENT:  Head: Atraumatic.  Nose: Rhinorrhea present.  Mouth/Throat: Oropharynx is clear and moist and mucous membranes are normal.  Eyes: Conjunctivae and EOM are normal. Pupils are equal, round, and reactive to light.  Neck: No thyromegaly present.  Cardiovascular: Normal rate and regular rhythm.   Murmur (SEM II/VI RUSB) heard. Pulses:      Dorsalis pedis pulses are 2+ on the right side, and 2+ on the left side.  Respiratory: Effort normal and breath sounds normal. No respiratory distress.  GI: Soft. He exhibits no mass. There is no hepatomegaly. There is no tenderness.  Musculoskeletal: He exhibits no edema.  Lymphadenopathy:    He has no cervical adenopathy.  Neurological: He is alert and oriented to person, place, and time. He has normal strength.  Stable gait, no assistance.  Skin: Skin is warm. No erythema.  Psychiatric: He has a normal mood and affect.  Well groomed, good eye contact.      ASSESSMENT AND PLAN:     Massey was seen today for follow-up.  Diagnoses and all orders for this visit:  CKD (chronic kidney disease), stage III  Low salt diet. Avoid NSAID's. Good BP controlled, goal < 140/90.   -     Basic Metabolic Panel -     VITAMIN D 25 Hydroxy (Vit-D Deficiency, Fractures) -     Microalbumin/Creatinine Ratio, Urine  Essential hypertension, benign  Elevated today. Because reporting home BP's < 140/90, no changes in current management. Continue monitoring BP. DASH diet recommended. Eye exam recommended annually. F/U in 6 months, before if needed.  Cough I thinks it is related with allergies.  RAD,? Asthma or COPD, refused trying inhaler treatment with Albuterol or Advair. He is not interested in a repeating CXR.      -Johnny. Vester Morrow was advised to return sooner than planned  today if new concerns arise.       Kristan Brummitt G. Martinique, MD  [facility]. [place] office.

## 2016-03-25 NOTE — Progress Notes (Signed)
Pre visit review using our clinic review tool, if applicable. No additional management support is needed unless otherwise documented below in the visit note. 

## 2016-03-26 ENCOUNTER — Ambulatory Visit: Payer: Medicare Other | Admitting: Internal Medicine

## 2016-03-30 ENCOUNTER — Encounter: Payer: Self-pay | Admitting: Family Medicine

## 2016-03-31 ENCOUNTER — Ambulatory Visit: Payer: Medicare Other | Admitting: Internal Medicine

## 2016-04-14 DIAGNOSIS — H2513 Age-related nuclear cataract, bilateral: Secondary | ICD-10-CM | POA: Diagnosis not present

## 2016-06-10 DIAGNOSIS — Z23 Encounter for immunization: Secondary | ICD-10-CM | POA: Diagnosis not present

## 2016-07-29 LAB — HEPATIC FUNCTION PANEL
ALK PHOS: 68 U/L (ref 25–125)
ALT: 8 U/L — AB (ref 10–40)
AST: 18 U/L (ref 14–40)
Bilirubin, Total: 0.4 mg/dL

## 2016-07-29 LAB — LIPID PANEL
CHOLESTEROL: 211 mg/dL — AB (ref 0–200)
HDL: 99 mg/dL — AB (ref 35–70)
LDL Cholesterol: 97 mg/dL
TRIGLYCERIDES: 76 mg/dL (ref 40–160)

## 2016-07-29 LAB — BASIC METABOLIC PANEL
BUN: 37 mg/dL — AB (ref 4–21)
Creatinine: 2.6 mg/dL — AB (ref 0.6–1.3)
Glucose: 106 mg/dL
POTASSIUM: 4.2 mmol/L (ref 3.4–5.3)
Sodium: 141 mmol/L (ref 137–147)

## 2016-07-29 LAB — CBC AND DIFFERENTIAL
HCT: 33 % — AB (ref 41–53)
HEMOGLOBIN: 11.6 g/dL — AB (ref 13.5–17.5)
PLATELETS: 194 10*3/uL (ref 150–399)
WBC: 4.7 10*3/mL

## 2016-08-20 ENCOUNTER — Ambulatory Visit (INDEPENDENT_AMBULATORY_CARE_PROVIDER_SITE_OTHER): Payer: Medicare Other | Admitting: Family Medicine

## 2016-08-20 ENCOUNTER — Telehealth: Payer: Self-pay | Admitting: Internal Medicine

## 2016-08-20 ENCOUNTER — Encounter: Payer: Self-pay | Admitting: Family Medicine

## 2016-08-20 VITALS — BP 140/82 | HR 73 | Resp 12 | Ht 63.0 in | Wt 134.5 lb

## 2016-08-20 DIAGNOSIS — N183 Chronic kidney disease, stage 3 unspecified: Secondary | ICD-10-CM

## 2016-08-20 DIAGNOSIS — E7800 Pure hypercholesterolemia, unspecified: Secondary | ICD-10-CM | POA: Diagnosis not present

## 2016-08-20 DIAGNOSIS — E785 Hyperlipidemia, unspecified: Secondary | ICD-10-CM | POA: Insufficient documentation

## 2016-08-20 DIAGNOSIS — R0609 Other forms of dyspnea: Secondary | ICD-10-CM | POA: Diagnosis not present

## 2016-08-20 DIAGNOSIS — N184 Chronic kidney disease, stage 4 (severe): Secondary | ICD-10-CM | POA: Insufficient documentation

## 2016-08-20 DIAGNOSIS — I10 Essential (primary) hypertension: Secondary | ICD-10-CM | POA: Diagnosis not present

## 2016-08-20 LAB — COMPREHENSIVE METABOLIC PANEL
ALT: 9 U/L (ref 0–53)
AST: 19 U/L (ref 0–37)
Albumin: 3.7 g/dL (ref 3.5–5.2)
Alkaline Phosphatase: 61 U/L (ref 39–117)
BUN: 42 mg/dL — AB (ref 6–23)
CHLORIDE: 108 meq/L (ref 96–112)
CO2: 23 mEq/L (ref 19–32)
Calcium: 8.8 mg/dL (ref 8.4–10.5)
Creatinine, Ser: 2.45 mg/dL — ABNORMAL HIGH (ref 0.40–1.50)
GFR: 31.81 mL/min — ABNORMAL LOW (ref >60.00)
GLUCOSE: 111 mg/dL — AB (ref 70–99)
POTASSIUM: 3.9 meq/L (ref 3.5–5.1)
SODIUM: 139 meq/L (ref 135–145)
Total Bilirubin: 0.5 mg/dL (ref 0.2–1.2)
Total Protein: 6.3 g/dL (ref 6.0–8.3)

## 2016-08-20 LAB — VITAMIN D 25 HYDROXY (VIT D DEFICIENCY, FRACTURES): VITD: 38.08 ng/mL (ref 30.00–100.00)

## 2016-08-20 LAB — BRAIN NATRIURETIC PEPTIDE: PRO B NATRI PEPTIDE: 373 pg/mL — AB (ref 0.0–100.0)

## 2016-08-20 LAB — MICROALBUMIN / CREATININE URINE RATIO
CREATININE, U: 122.8 mg/dL
MICROALB/CREAT RATIO: 262.8 mg/g — AB (ref 0.0–30.0)
Microalb, Ur: 322.7 mg/dL — ABNORMAL HIGH (ref 0.0–1.9)

## 2016-08-20 MED ORDER — ALBUTEROL SULFATE HFA 108 (90 BASE) MCG/ACT IN AERS: 2.0000 | INHALATION_SPRAY | Freq: Four times a day (QID) | RESPIRATORY_TRACT | 0 refills | Status: DC | PRN

## 2016-08-20 NOTE — Telephone Encounter (Signed)
Per Dr. Doug Sou office note from 8/17, patient has already established care with her.

## 2016-08-20 NOTE — Telephone Encounter (Signed)
Pt has been sch

## 2016-08-20 NOTE — Telephone Encounter (Signed)
Pt has been seeing dr Martinique for last 3 visit. Pt is dr burns pt. Can I change banner to dr Martinique

## 2016-08-20 NOTE — Progress Notes (Signed)
Pre visit review using our clinic review tool, if applicable. No additional management support is needed unless otherwise documented below in the visit note. 

## 2016-08-20 NOTE — Progress Notes (Signed)
HPI:   JohnnyJohnny Morrow is a 83 y.o. male, who is here today to follow on some of his chronic medical problems.   Hypertension: Currently he is on Amlodipine 10 mg daily.  Reporting no side effects from medication. He denies any frequent/severe headache, visual changes, chest pain,palpitation, abdominal pain, nausea, vomiting, or edema.  Lab Results  Component Value Date   CREATININE 2.25 (H) 03/25/2016   BUN 33 (H) 03/25/2016   NA 139 03/25/2016   K 3.6 03/25/2016   CL 107 03/25/2016   CO2 24 03/25/2016   + Exertional dyspnea, which he tells me, happens around winter when he walks outdoors but resolves once weather is warmer. He denies orthopnea or PND. He has not noted wheezing. + Mild productive cough with clear sputum. She denies chills, fever, fatigue, recent travel, or sick contact. Hx of allergies and asthma (Dx in the 1990's).   CKD III, He denies foam in the urine or gross hematuria. States that he used to follow with nephrologists years ago. He is on Vit D3 5000 U daily. Labs done through a private lab as part of insurance done 07/28/16, brought results and e GFR 23, Cr 2.6. CBC mild anemia.  Lab Results  Component Value Date   CHOL 211 (A) 07/29/2016   HDL 99 (A) 07/29/2016   LDLCALC 97 07/29/2016   TRIG 76 07/29/2016    Lab Results  Component Value Date   WBC 4.7 07/29/2016   HGB 11.6 (A) 07/29/2016   HCT 33 (A) 07/29/2016   MCV 85.8 01/21/2016   PLT 194 07/29/2016    Status post prostatectomy and hormonal suppression in 1993 and 1995 respectively. He follows with urologist twice per year. Nocturia once or twice per night, stable for years.    Review of Systems  Constitutional: Negative for activity change, appetite change, fatigue, fever and unexpected weight change.  HENT: Positive for postnasal drip. Negative for nosebleeds, sore throat and trouble swallowing.   Eyes: Negative for pain, redness and visual disturbance.  Respiratory:  Positive for cough and shortness of breath. Negative for chest tightness, wheezing and stridor.   Cardiovascular: Negative for chest pain and palpitations.  Gastrointestinal: Negative for abdominal pain, nausea and vomiting.       No changes in bowel habits.  Genitourinary: Negative for decreased urine volume, dysuria and hematuria.  Musculoskeletal: Negative for arthralgias and myalgias.  Skin: Negative for rash.  Allergic/Immunologic: Positive for environmental allergies.  Neurological: Negative for dizziness, weakness, numbness and headaches.  Psychiatric/Behavioral: Negative for confusion. The patient is not nervous/anxious.       Current Outpatient Prescriptions on File Prior to Visit  Medication Sig Dispense Refill  . amLODipine (NORVASC) 10 MG tablet Take 1 tablet (10 mg total) by mouth daily. 90 tablet 3  . B Complex Vitamins (VITAMIN-B COMPLEX PO) Take by mouth.    . Bacillus Coagulans-Inulin (PROBIOTIC FORMULA) 1-250 BILLION-MG CAPS Take 250 mg by mouth daily.    . Cholecalciferol (VITAMIN D3) 5000 units TABS Take by mouth daily.    . Coenzyme Q10 (CO Q 10) 100 MG CAPS Take 100 mg by mouth daily.    Water engineer Bandages & Supports (LUMBAR BACK BRACE/SUPPORT PAD) MISC Use as directed for lumbar scoliosis 1 each 0  . Multiple Vitamin (MULTI VITAMIN PO) Take by mouth daily.    . Omega 3 1000 MG CAPS Take 2,000 mg by mouth daily.     No current facility-administered medications on file prior  to visit.      Past Medical History:  Diagnosis Date  . Cancer ([place])   . Hypertension    Allergies  Allergen Reactions  . Penicillins Rash    Social History   Social History  . Marital status: Married    Spouse name: N/A  . Number of children: N/A  . Years of education: N/A   Social History Main Topics  . Smoking status: Former Research scientist (life sciences)  . Smokeless tobacco: Never Used  . Alcohol use Yes  . Drug use: No  . Sexual activity: Not Asked   Other Topics Concern  . None    Social History Narrative  . None    Vitals:   08/20/16 1113  BP: 140/82  Pulse: 73  Resp: 12   O2 sat 95% at RA.  Body mass index is 23.83 kg/m.    Physical Exam  Nursing note and vitals reviewed. Constitutional: He is oriented to person, place, and time. He appears well-developed and well-nourished. No distress.  HENT:  Head: Atraumatic.  Mouth/Throat: Oropharynx is clear and moist and mucous membranes are normal.  Eyes: Conjunctivae and EOM are normal.  Neck: No JVD present. No thyroid mass and no thyromegaly present.  Cardiovascular: Normal rate and regular rhythm.   Murmur (SEM II/VI RUSB) heard. Pulses:      Dorsalis pedis pulses are 2+ on the right side, and 2+ on the left side.  Respiratory: Effort normal and breath sounds normal. No respiratory distress.  GI: Soft. He exhibits no mass. There is no hepatomegaly. There is no tenderness.  Musculoskeletal: He exhibits edema (trace pitting edema LE, R>L.). He exhibits no tenderness.  Lymphadenopathy:    He has no cervical adenopathy.  Neurological: He is alert and oriented to person, place, and time. He has normal strength. Coordination normal.  Slow but stable gait with no assistance  Skin: Skin is warm. No erythema.  Psychiatric: He has a normal mood and affect. Cognition and memory are normal.  Well groomed, good eye contact.      ASSESSMENT AND PLAN:     Blandon was seen today for follow-up.  Diagnoses and all orders for this visit:  Exertional dyspnea  It does not seem to be a new symptom, recurrent and associated to cold weather. We dicussed some possible etiologies: cardiac,pulmonary, allergies,diconditioning among some. Echo 12/2015 LVEF [id number], grade I diastolic dysfunction. CXR 11/2015 mild bibasilar lung opacities that favor scarring or atelectasis.  He agrees with trying Albuterol inh as needed. Further recommendations will be given according to lab results. Clearly instructed about warning  signs.   -     B Nat Peptide -     albuterol (PROVENTIL HFA;VENTOLIN HFA) 108 (90 Base) MCG/ACT inhaler; Inhale 2 puffs into the lungs every 6 (six) hours as needed for wheezing or shortness of breath.  CKD (chronic kidney disease), stage III  E GFR on recent labs worse, his baseline has been 34-35.  He does not want to go to nephrologists. We will repeat lab but if e GFR < 30 I will strongly recommend nephrology referral.  -     B Nat Peptide -     Comprehensive metabolic panel -     VITAMIN D 25 Hydroxy (Vit-D Deficiency, Fractures) -     Microalbumin / creatinine urine ratio  Essential hypertension, benign  Otherwise adequately controlled. No changes in current management. DASH-low salt diet recommended. Eye exam periodically, last one 03/2016. F/U in 5-6 months, before if needed.  -  Comprehensive metabolic panel   Pure hypercholesterolemia  Low fat diet to continue for now. F/U in 12 months.     -Mr. [facility]son [facility] was advised to return sooner than planned today if new concerns arise.       Cassy Sprowl G. Martinique, MD  [facility]. [place] office.

## 2016-08-20 NOTE — Telephone Encounter (Signed)
Yes, please change.  Thank you.

## 2016-08-20 NOTE — Patient Instructions (Signed)
A few things to remember from today's visit:   Essential hypertension, benign - Plan: Comprehensive metabolic panel  CKD (chronic kidney disease), stage III - Plan: B Nat Peptide, Comprehensive metabolic panel, VITAMIN D 25 Hydroxy (Vit-D Deficiency, Fractures), Microalbumin / creatinine urine ratio  Exertional dyspnea - Plan: B Nat Peptide  To preserve kidney function and/or slow progression of disease:  Low salt diet and adequate hydration. Avoiding medicines known as "nonsteroidal anti-inflammatory drugs," or NSAIDs. These medicines include ibuprofen (sample brand names: Advil, Motrin) and naproxen (sample brand name: Aleve).  Adequate blood pressure control. Low phosphorus diet.   Please be sure medication list is accurate. If a new problem present, please set up appointment sooner than planned today.

## 2016-08-25 ENCOUNTER — Other Ambulatory Visit: Payer: Self-pay

## 2016-08-25 ENCOUNTER — Ambulatory Visit: Payer: Medicare Other | Admitting: Family Medicine

## 2016-08-25 DIAGNOSIS — N289 Disorder of kidney and ureter, unspecified: Secondary | ICD-10-CM

## 2016-09-08 ENCOUNTER — Telehealth: Payer: Self-pay | Admitting: Family Medicine

## 2016-09-08 DIAGNOSIS — R739 Hyperglycemia, unspecified: Secondary | ICD-10-CM

## 2016-09-08 NOTE — Telephone Encounter (Signed)
° ° ° °  Pt call to say he would like to have a A1C test. Would like a call back

## 2016-09-08 NOTE — Telephone Encounter (Signed)
Okay to order A1C?

## 2016-09-08 NOTE — Telephone Encounter (Signed)
It is Ok to do HgA1C. It was done in 12/2015 and was 6.2. Hyperglycemia can be used as Dx.  Thanks, BJ

## 2016-09-09 NOTE — Telephone Encounter (Signed)
Lab appt is scheduled 

## 2016-09-09 NOTE — Telephone Encounter (Signed)
Lab order placed. Left voicemail letting patient know lab order has been placed, and that he just needs to call back and schedule a lab appt.

## 2016-09-10 DIAGNOSIS — C61 Malignant neoplasm of prostate: Secondary | ICD-10-CM | POA: Diagnosis not present

## 2016-09-11 DIAGNOSIS — H2513 Age-related nuclear cataract, bilateral: Secondary | ICD-10-CM | POA: Diagnosis not present

## 2016-09-11 DIAGNOSIS — H3563 Retinal hemorrhage, bilateral: Secondary | ICD-10-CM | POA: Diagnosis not present

## 2016-09-11 DIAGNOSIS — H40011 Open angle with borderline findings, low risk, right eye: Secondary | ICD-10-CM | POA: Diagnosis not present

## 2016-09-11 DIAGNOSIS — H40012 Open angle with borderline findings, low risk, left eye: Secondary | ICD-10-CM | POA: Diagnosis not present

## 2016-09-12 ENCOUNTER — Encounter: Payer: Self-pay | Admitting: Family Medicine

## 2016-09-12 ENCOUNTER — Other Ambulatory Visit (INDEPENDENT_AMBULATORY_CARE_PROVIDER_SITE_OTHER): Payer: Medicare Other

## 2016-09-12 DIAGNOSIS — R739 Hyperglycemia, unspecified: Secondary | ICD-10-CM

## 2016-09-12 LAB — HEMOGLOBIN A1C: Hgb A1c MFr Bld: 5.8 % (ref 4.6–6.5)

## 2016-09-17 DIAGNOSIS — H3563 Retinal hemorrhage, bilateral: Secondary | ICD-10-CM | POA: Diagnosis not present

## 2016-09-17 DIAGNOSIS — H2513 Age-related nuclear cataract, bilateral: Secondary | ICD-10-CM | POA: Diagnosis not present

## 2016-09-19 ENCOUNTER — Telehealth: Payer: Self-pay | Admitting: Family Medicine

## 2016-09-19 NOTE — Telephone Encounter (Signed)
Called and spoke with patient. He wanted to know the results of his A1C. Advised patient that A1C was normal, he said that his eye doctor was concerned because there was blood in his retina. He will let them know about the results. Nothing further needed at the moment, did advise patient to call back if he needed anything else.

## 2016-09-19 NOTE — Telephone Encounter (Signed)
Pt would like a call back about lab results ..  °

## 2016-09-22 ENCOUNTER — Other Ambulatory Visit: Payer: Self-pay | Admitting: Internal Medicine

## 2016-09-22 DIAGNOSIS — N184 Chronic kidney disease, stage 4 (severe): Secondary | ICD-10-CM | POA: Diagnosis not present

## 2016-09-22 DIAGNOSIS — I129 Hypertensive chronic kidney disease with stage 1 through stage 4 chronic kidney disease, or unspecified chronic kidney disease: Secondary | ICD-10-CM | POA: Diagnosis not present

## 2016-09-23 ENCOUNTER — Telehealth: Payer: Self-pay | Admitting: Family Medicine

## 2016-09-23 MED ORDER — AMLODIPINE BESYLATE 10 MG PO TABS: 10.0000 mg | ORAL_TABLET | Freq: Every day | ORAL | 1 refills | Status: DC

## 2016-09-23 NOTE — Telephone Encounter (Signed)
Pt need new Rx for amlodipine  Pharm:  [facility]  Pt is aware of 3 business days for refills state that he is out.

## 2016-09-23 NOTE — Telephone Encounter (Signed)
Rx sent 

## 2016-09-29 ENCOUNTER — Encounter: Payer: Self-pay | Admitting: Family Medicine

## 2016-09-29 ENCOUNTER — Ambulatory Visit (INDEPENDENT_AMBULATORY_CARE_PROVIDER_SITE_OTHER): Payer: Medicare Other | Admitting: Family Medicine

## 2016-09-29 VITALS — BP 150/80 | HR 76 | Resp 12 | Ht 63.0 in | Wt 132.2 lb

## 2016-09-29 DIAGNOSIS — I519 Heart disease, unspecified: Secondary | ICD-10-CM

## 2016-09-29 DIAGNOSIS — R0989 Other specified symptoms and signs involving the circulatory and respiratory systems: Secondary | ICD-10-CM | POA: Diagnosis not present

## 2016-09-29 DIAGNOSIS — I10 Essential (primary) hypertension: Secondary | ICD-10-CM | POA: Diagnosis not present

## 2016-09-29 MED ORDER — HYDRALAZINE HCL 10 MG PO TABS: 10.0000 mg | ORAL_TABLET | Freq: Three times a day (TID) | ORAL | 1 refills | Status: DC

## 2016-09-29 NOTE — Patient Instructions (Signed)
A few things to remember from today's visit:   Essential hypertension, benign - Plan: Ambulatory referral to Cardiology, DG Chest 2 View  Mild diastolic dysfunction - Plan: Ambulatory referral to Cardiology, DG Chest 2 View  Today Hydralazine added to Amlodipine.  Monitor blood pressure, keep appointment with nephrologists.So I will see you in 2-3 months.    Please be sure medication list is accurate. If a new problem present, please set up appointment sooner than planned today.

## 2016-09-29 NOTE — Progress Notes (Signed)
Mr. Johnny Morrow is a 83 y.o.male, who is here today to follow on HTN.  ACUTE VISIT: Chief Complaint  Patient presents with  . blood pressure running high   Since 09/23/16 BP 160-180/80-90's. Currently he is on Amlodipine 10 mg daily. He is taking medications as instructed, no side effects reported.  He has not noted unusual headache, visual changes, exertional chest pain, dyspnea,or focal weakness. Mild LE edema R>L, stable otherwise.   Hx of CKD III, she saw nephrologists a few weeks ago, he states that his BP was elevated (170/?) and according to pt, he was instructed to continue monitoring BP bid. Next follow-up appointment 10/21/2016.  Lab Results  Component Value Date   CREATININE 2.45 (H) 08/20/2016   BUN 42 (H) 08/20/2016   NA 139 08/20/2016   K 3.9 08/20/2016   CL 108 08/20/2016   CO2 23 08/20/2016   Lisinopril was discontinued a few months ago because Cr 2.5. He was on BB in the past, discontinue years ago, he is not sure about side effect. Hx of bradycardia and RBBB.  Echo 01/11/16 moderate concentric hypertrophy. Systolic function was vigorous. The estimated ejection fraction was in the range of 65% to 70%.Doppler parameters are consistent with abnormal left ventricular relaxation (grade 1 diastolic dysfunction). Doppler parameters are consistent with elevated ventricular end-diastolic filling pressure.  Sleeping on a pillow, denies orthopnea or PND.  According to patient, recently he had his eye exam, bilateral retinal hemorrhage was seen bilateral (09/17/16) , attributed to HTN.  Lab Results  Component Value Date   HGBA1C 5.8 09/12/2016     Review of Systems  Constitutional: Negative for appetite change, fatigue, fever and unexpected weight change.  HENT: Negative for nosebleeds, sore throat and trouble swallowing.   Eyes: Negative for redness and visual disturbance.  Respiratory: Negative for apnea, cough, shortness of breath and wheezing.     Cardiovascular: Positive for leg swelling. Negative for chest pain and palpitations.  Gastrointestinal: Negative for abdominal pain, nausea and vomiting.  Genitourinary: Negative for decreased urine volume and hematuria.  Musculoskeletal: Positive for back pain (chronic). Negative for myalgias.  Skin: Negative for rash.  Neurological: Negative for dizziness, seizures, weakness, numbness and headaches.  Psychiatric/Behavioral: Negative for confusion. The patient is nervous/anxious.      Current Outpatient Prescriptions on File Prior to Visit  Medication Sig Dispense Refill  . albuterol (PROVENTIL HFA;VENTOLIN HFA) 108 (90 Base) MCG/ACT inhaler Inhale 2 puffs into the lungs every 6 (six) hours as needed for wheezing or shortness of breath. 1 Inhaler 0  . amLODipine (NORVASC) 10 MG tablet Take 1 tablet (10 mg total) by mouth daily. 90 tablet 1  . B Complex Vitamins (VITAMIN-B COMPLEX PO) Take by mouth.    . Bacillus Coagulans-Inulin (PROBIOTIC FORMULA) 1-250 BILLION-MG CAPS Take 250 mg by mouth daily.    . Cholecalciferol (VITAMIN D3) 5000 units TABS Take by mouth daily.    . Coenzyme Q10 (CO Q 10) 100 MG CAPS Take 100 mg by mouth daily.    Water engineer Bandages & Supports (LUMBAR BACK BRACE/SUPPORT PAD) MISC Use as directed for lumbar scoliosis 1 each 0  . Multiple Vitamin (MULTI VITAMIN PO) Take by mouth daily.    . Omega 3 1000 MG CAPS Take 2,000 mg by mouth daily.     No current facility-administered medications on file prior to visit.      Past Medical History:  Diagnosis Date  . Arthritis   . Cancer ([place])  Prostate cancer, follows with hematologists q 6 months. S/p radical prostatectomy and proton therapy in 2001.  . Cataract   . CKD (chronic kidney disease)    Follows with nephrologists.  . Hypertension   . Radiation cystitis   . Retinal hemorrhage of both eyes    follows with ophthalmologists.    Allergies  Allergen Reactions  . Penicillins Rash    Social History    Social History  . Marital status: Married    Spouse name: N/A  . Number of children: N/A  . Years of education: N/A   Social History Main Topics  . Smoking status: Former Research scientist (life sciences)  . Smokeless tobacco: Never Used  . Alcohol use Yes  . Drug use: No  . Sexual activity: Not on file   Other Topics Concern  . Not on file   Social History Narrative  . No narrative on file    Vitals:   09/29/16 1600  BP: (!) 150/80  Pulse: 76  Resp: 12  O2 sat 94% at RA. Body mass index is 23.43 kg/m.  Wt Readings from Last 3 Encounters:  09/29/16 132 lb 4 oz (60 kg)  08/20/16 134 lb 8 oz (61 kg)  03/25/16 125 lb 6 oz (56.9 kg)     Physical Exam  Nursing note and vitals reviewed. Constitutional: He is oriented to person, place, and time. He appears well-developed and well-nourished. No distress.  HENT:  Head: Atraumatic.  Mouth/Throat: Oropharynx is clear and moist and mucous membranes are normal. He has dentures.  Eyes: Conjunctivae and EOM are normal.  Neck: JVD (mild at 30 degrees) present.  Cardiovascular: Normal rate and regular rhythm.   Murmur (SEM II/VI RUSB) heard. DP pulses present bilateral.  Respiratory: Effort normal. No respiratory distress. He has rales (? right base rales.).  GI: Soft. He exhibits no mass. There is no hepatomegaly. There is no tenderness.  Musculoskeletal: He exhibits edema (1+ pitting edema RLE, trtace pitting edema LLE.). He exhibits no tenderness.  Neurological: He is alert and oriented to person, place, and time. He has normal strength. Coordination normal.  Stable gait with no assistance  Skin: Skin is warm. No erythema.  Psychiatric: His mood appears anxious. Cognition and memory are normal.  Well groomed, good eye contact.    ASSESSMENT AND PLAN:   Jett was seen today for blood pressure running high.  Diagnoses and all orders for this visit:  Essential hypertension, benign  Not well controlled. Hydralazine 10 mg tid added today. No  changes in Amlodipine. Possible complications of elevated BP discussed. Low salt diet. Instructed about warning signs.  He is following with nephrologists on 10/21/16, so I will see him back in 2-3 months.   -     hydrALAZINE (APRESOLINE) 10 MG tablet; Take 1 tablet (10 mg total) by mouth 3 (three) times daily. -     Ambulatory referral to Cardiology  Mild diastolic dysfunction  He is not having symptoms. BNP 08/20/16 373. Today on examination mild JVD. Recommend weighing daily. He agrees with cardiology consultation.  -     Ambulatory referral to Cardiology -     DG Chest 2 View; Future  Chest rales  Fine rales left base. In the past he has c/o cough and mild dyspnea, both he is reporting as resolved today. CXR ordered today, further recommendations will be given accordingly.   -     DG Chest 2 View; Future     -Mr. Manases Chesky was advised to return  sooner than planned today if new concerns arise.     Betty G. Martinique, MD  [facility]. [place] office.

## 2016-09-29 NOTE — Progress Notes (Signed)
Pre visit review using our clinic review tool, if applicable. No additional management support is needed unless otherwise documented below in the visit note. 

## 2016-09-30 ENCOUNTER — Ambulatory Visit (INDEPENDENT_AMBULATORY_CARE_PROVIDER_SITE_OTHER)
Admission: RE | Admit: 2016-09-30 | Discharge: 2016-09-30 | Disposition: A | Discharge status: Home / Self Care | Payer: Medicare Other | Source: Ambulatory Visit | Attending: Family Medicine | Admitting: Family Medicine

## 2016-09-30 DIAGNOSIS — J449 Chronic obstructive pulmonary disease, unspecified: Secondary | ICD-10-CM | POA: Diagnosis not present

## 2016-09-30 DIAGNOSIS — R0989 Other specified symptoms and signs involving the circulatory and respiratory systems: Secondary | ICD-10-CM | POA: Diagnosis not present

## 2016-09-30 DIAGNOSIS — I519 Heart disease, unspecified: Secondary | ICD-10-CM | POA: Diagnosis not present

## 2016-10-01 ENCOUNTER — Other Ambulatory Visit: Payer: Self-pay | Admitting: Nephrology

## 2016-10-01 ENCOUNTER — Telehealth: Payer: Self-pay | Admitting: Family Medicine

## 2016-10-01 DIAGNOSIS — I129 Hypertensive chronic kidney disease with stage 1 through stage 4 chronic kidney disease, or unspecified chronic kidney disease: Secondary | ICD-10-CM

## 2016-10-01 DIAGNOSIS — N184 Chronic kidney disease, stage 4 (severe): Principal | ICD-10-CM

## 2016-10-01 NOTE — Telephone Encounter (Signed)
Pt called to get the results from his xray and state that someone called and asked him to return the call.

## 2016-10-02 NOTE — Telephone Encounter (Signed)
Informed patient of results and patient verbalized understanding.  

## 2016-10-03 ENCOUNTER — Ambulatory Visit
Admission: RE | Admit: 2016-10-03 | Discharge: 2016-10-03 | Disposition: A | Discharge status: Home / Self Care | Payer: Medicare Other | Source: Ambulatory Visit | Attending: Nephrology | Admitting: Nephrology

## 2016-10-03 DIAGNOSIS — N184 Chronic kidney disease, stage 4 (severe): Principal | ICD-10-CM

## 2016-10-03 DIAGNOSIS — I129 Hypertensive chronic kidney disease with stage 1 through stage 4 chronic kidney disease, or unspecified chronic kidney disease: Secondary | ICD-10-CM

## 2016-10-08 ENCOUNTER — Encounter: Payer: Self-pay | Admitting: Internal Medicine

## 2016-10-17 ENCOUNTER — Encounter: Payer: Self-pay | Admitting: Internal Medicine

## 2016-10-17 ENCOUNTER — Ambulatory Visit (INDEPENDENT_AMBULATORY_CARE_PROVIDER_SITE_OTHER): Payer: Medicare Other | Admitting: Internal Medicine

## 2016-10-17 VITALS — BP 170/80 | HR 58 | Ht 63.0 in | Wt 130.1 lb

## 2016-10-17 DIAGNOSIS — I10 Essential (primary) hypertension: Secondary | ICD-10-CM | POA: Diagnosis not present

## 2016-10-17 NOTE — Progress Notes (Addendum)
Cardiology Office Note   Date:  10/17/2016   ID:  Johnny Morrow, DOB 04/20/1920, MRN [id number]  PCP:  Johnny Martinique, MD  Cardiologist:   Johnny Carnes, MD   Pt referred for eval of HTN      History of Present Illness: Johnny Morrow is a 83 y.o. male with a history of HTN, mild diastolic dysfuncton   Moved to [place] not that long ago   Seen in IM clinic a couple wks ago  Rales heard on exam   CXR showed COPD  Thorracic aortic atherosclerois.  Curvature of the spine    Pt denies SOB  No Cp  NO dizziness  Says his BP is very labile  High up t o200 Being seen by Johnny Morrow  Told to keep BP log  Has f/U appt in March   Current Meds  Medication Sig  . albuterol (PROVENTIL HFA;VENTOLIN HFA) 108 (90 Base) MCG/ACT inhaler Inhale 2 puffs into the lungs every 6 (six) hours as needed for wheezing or shortness of breath.  Marland Kitchen amLODipine (NORVASC) 10 MG tablet Take 1 tablet (10 mg total) by mouth daily.  . B Complex Vitamins (VITAMIN-B COMPLEX PO) Take by mouth.  . Bacillus Coagulans-Inulin (PROBIOTIC FORMULA) 1-250 BILLION-MG CAPS Take 250 mg by mouth daily.  . Cholecalciferol (VITAMIN D3) 5000 units TABS Take by mouth daily.  . Coenzyme Q10 (CO Q 10) 100 MG CAPS Take 100 mg by mouth daily.  Water engineer Bandages & Supports (LUMBAR BACK BRACE/SUPPORT PAD) MISC Use as directed for lumbar scoliosis  . hydrALAZINE (APRESOLINE) 10 MG tablet Take 1 tablet (10 mg total) by mouth 3 (three) times daily.  . Multiple Vitamin (MULTI VITAMIN PO) Take by mouth daily.  . Omega 3 1000 MG CAPS Take 2,000 mg by mouth daily.     Allergies:   Iodinated diagnostic agents and Penicillins   Past Medical History:  Diagnosis Date  . Arthritis   . Cancer [facility])    Prostate cancer, follows with hematologists q 6 months. S/p radical prostatectomy and proton therapy in 2001.  . Cataract   . CKD (chronic kidney disease)    Follows with nephrologists.  . Hypertension   . Radiation cystitis   . Retinal hemorrhage of  both eyes    follows with ophthalmologists.    Past Surgical History:  Procedure Laterality Date  . HERNIA REPAIR       Social History:  The patient  reports that he has quit smoking. His smoking use included Cigarettes. He has never used smokeless tobacco. He reports that he drinks alcohol. He reports that he does not use drugs.   Family History:  The patient's family history includes Cancer in his father.    ROS:  Please see the history of present illness. All other systems are reviewed and  Negative to the above problem except as noted.    PHYSICAL EXAM: VS:  BP (!) 170/80 Comment: LEFT ARM  Pulse (!) 58   Ht 5\' 3"  (1.6 m)   Wt 130 lb 1.9 oz (59 kg)   BMI 23.05 kg/m   GEN: Well nourished, well developed, in no acute distress  HEENT: normal  Neck: no JVD,Soft murmur neck  NO masses Cardiac: RRR; Gr II/VI systolic murmur LSB  Gr I/VI diastolic murmur  No rubs, or gallops  Triv edema  Respiratory:  clear to auscultation bilaterally, normal work of breathing GI: soft, nontender, nondistended, + BS  No hepatomegaly  MS: no deformity Moving all  extremities   Skin: warm and dry, no rash Neuro:  Strength and sensation are intact Psych: euthymic mood, full affect   EKG:  EKG is ordered today.  SB 58   RBBB  LVH     Lipid Panel    Component Value Date/Time   CHOL 211 (A) 07/29/2016   TRIG 76 07/29/2016   HDL 99 (A) 07/29/2016   LDLCALC 97 07/29/2016      Wt Readings from Last 3 Encounters:  10/17/16 130 lb 1.9 oz (59 kg)  09/29/16 132 lb 4 oz (60 kg)  08/20/16 134 lb 8 oz (61 kg)      ASSESSMENT AND PLAN:  1 HTN PT with signif hypertension  BP labile  I do not plan on changing meds today  He has f/u in renal clinc soon with Johnny Morrow  2  AV dz  Mild AS/AI  I do not think this will become a signif problem  3  Diastolic dysfunction  Pt asymptomatic  Volume status OK  Will be available as needed but not set an appt now.  Again, he has f/u soon in renal clinic  for BP  Current medicines are reviewed at length with the patient today.  The patient does not have concerns regarding medicines.  Signed, Johnny Carnes, MD  10/17/2016 12:08 PM    [facility] [address], [place], [place], [place]  [address] Phone: [phone]; Fax: [phone]

## 2016-10-17 NOTE — Patient Instructions (Signed)
Your physician recommends that you continue on your current medications as directed. Please refer to the Current Medication list given to you today. Your physician recommends that you schedule a follow-up appointment as needed with Dr. Harrington Challenger.

## 2016-10-27 DIAGNOSIS — N184 Chronic kidney disease, stage 4 (severe): Secondary | ICD-10-CM | POA: Diagnosis not present

## 2016-10-27 DIAGNOSIS — I129 Hypertensive chronic kidney disease with stage 1 through stage 4 chronic kidney disease, or unspecified chronic kidney disease: Secondary | ICD-10-CM | POA: Diagnosis not present

## 2016-11-15 DIAGNOSIS — N2889 Other specified disorders of kidney and ureter: Secondary | ICD-10-CM | POA: Diagnosis not present

## 2016-11-15 DIAGNOSIS — R609 Edema, unspecified: Secondary | ICD-10-CM | POA: Diagnosis not present

## 2016-11-15 DIAGNOSIS — I129 Hypertensive chronic kidney disease with stage 1 through stage 4 chronic kidney disease, or unspecified chronic kidney disease: Secondary | ICD-10-CM | POA: Diagnosis not present

## 2016-11-15 DIAGNOSIS — N179 Acute kidney failure, unspecified: Secondary | ICD-10-CM | POA: Diagnosis not present

## 2016-11-15 DIAGNOSIS — R61 Generalized hyperhidrosis: Secondary | ICD-10-CM | POA: Diagnosis not present

## 2016-11-15 DIAGNOSIS — R7989 Other specified abnormal findings of blood chemistry: Secondary | ICD-10-CM | POA: Diagnosis not present

## 2016-11-15 DIAGNOSIS — I471 Supraventricular tachycardia: Secondary | ICD-10-CM | POA: Diagnosis not present

## 2016-11-15 DIAGNOSIS — I429 Cardiomyopathy, unspecified: Secondary | ICD-10-CM | POA: Diagnosis not present

## 2016-11-15 DIAGNOSIS — R000 Tachycardia, unspecified: Secondary | ICD-10-CM | POA: Diagnosis not present

## 2016-11-15 DIAGNOSIS — R918 Other nonspecific abnormal finding of lung field: Secondary | ICD-10-CM | POA: Diagnosis not present

## 2016-11-15 DIAGNOSIS — N183 Chronic kidney disease, stage 3 (moderate): Secondary | ICD-10-CM | POA: Diagnosis not present

## 2016-11-15 DIAGNOSIS — R809 Proteinuria, unspecified: Secondary | ICD-10-CM | POA: Diagnosis not present

## 2016-11-15 DIAGNOSIS — I4510 Unspecified right bundle-branch block: Secondary | ICD-10-CM | POA: Diagnosis not present

## 2016-11-15 DIAGNOSIS — N139 Obstructive and reflux uropathy, unspecified: Secondary | ICD-10-CM | POA: Diagnosis not present

## 2016-11-15 DIAGNOSIS — J189 Pneumonia, unspecified organism: Secondary | ICD-10-CM | POA: Diagnosis not present

## 2016-11-15 DIAGNOSIS — I5020 Unspecified systolic (congestive) heart failure: Secondary | ICD-10-CM | POA: Diagnosis present

## 2016-11-15 DIAGNOSIS — E872 Acidosis: Secondary | ICD-10-CM | POA: Diagnosis not present

## 2016-11-15 DIAGNOSIS — Z86718 Personal history of other venous thrombosis and embolism: Secondary | ICD-10-CM | POA: Diagnosis not present

## 2016-11-15 DIAGNOSIS — R110 Nausea: Secondary | ICD-10-CM | POA: Diagnosis not present

## 2016-11-15 DIAGNOSIS — Z8546 Personal history of malignant neoplasm of prostate: Secondary | ICD-10-CM | POA: Diagnosis not present

## 2016-11-15 DIAGNOSIS — R0600 Dyspnea, unspecified: Secondary | ICD-10-CM | POA: Diagnosis not present

## 2016-11-15 DIAGNOSIS — I248 Other forms of acute ischemic heart disease: Secondary | ICD-10-CM | POA: Diagnosis not present

## 2016-11-15 DIAGNOSIS — I351 Nonrheumatic aortic (valve) insufficiency: Secondary | ICD-10-CM | POA: Diagnosis not present

## 2016-11-15 DIAGNOSIS — I5189 Other ill-defined heart diseases: Secondary | ICD-10-CM | POA: Diagnosis not present

## 2016-11-15 DIAGNOSIS — R0609 Other forms of dyspnea: Secondary | ICD-10-CM | POA: Diagnosis not present

## 2016-11-15 DIAGNOSIS — I130 Hypertensive heart and chronic kidney disease with heart failure and stage 1 through stage 4 chronic kidney disease, or unspecified chronic kidney disease: Secondary | ICD-10-CM | POA: Diagnosis not present

## 2016-11-15 DIAGNOSIS — J159 Unspecified bacterial pneumonia: Secondary | ICD-10-CM | POA: Diagnosis not present

## 2016-11-15 DIAGNOSIS — N1330 Unspecified hydronephrosis: Secondary | ICD-10-CM | POA: Diagnosis not present

## 2016-11-15 DIAGNOSIS — N1339 Other hydronephrosis: Secondary | ICD-10-CM | POA: Diagnosis not present

## 2016-11-15 DIAGNOSIS — I480 Paroxysmal atrial fibrillation: Secondary | ICD-10-CM | POA: Diagnosis not present

## 2016-11-15 DIAGNOSIS — R911 Solitary pulmonary nodule: Secondary | ICD-10-CM | POA: Diagnosis not present

## 2016-11-15 DIAGNOSIS — E871 Hypo-osmolality and hyponatremia: Secondary | ICD-10-CM | POA: Diagnosis not present

## 2016-11-15 DIAGNOSIS — I352 Nonrheumatic aortic (valve) stenosis with insufficiency: Secondary | ICD-10-CM | POA: Diagnosis present

## 2016-11-15 DIAGNOSIS — D61818 Other pancytopenia: Secondary | ICD-10-CM | POA: Diagnosis not present

## 2016-11-15 DIAGNOSIS — I493 Ventricular premature depolarization: Secondary | ICD-10-CM | POA: Diagnosis not present

## 2016-11-15 DIAGNOSIS — Z880 Allergy status to penicillin: Secondary | ICD-10-CM | POA: Diagnosis not present

## 2016-11-15 DIAGNOSIS — N189 Chronic kidney disease, unspecified: Secondary | ICD-10-CM | POA: Diagnosis not present

## 2016-11-15 DIAGNOSIS — R808 Other proteinuria: Secondary | ICD-10-CM | POA: Diagnosis not present

## 2016-11-15 DIAGNOSIS — R0602 Shortness of breath: Secondary | ICD-10-CM | POA: Diagnosis not present

## 2016-11-15 DIAGNOSIS — D649 Anemia, unspecified: Secondary | ICD-10-CM | POA: Diagnosis not present

## 2016-11-15 DIAGNOSIS — M4185 Other forms of scoliosis, thoracolumbar region: Secondary | ICD-10-CM | POA: Diagnosis not present

## 2016-11-15 DIAGNOSIS — I10 Essential (primary) hypertension: Secondary | ICD-10-CM | POA: Diagnosis not present

## 2016-11-15 DIAGNOSIS — I517 Cardiomegaly: Secondary | ICD-10-CM | POA: Diagnosis not present

## 2016-11-15 DIAGNOSIS — D631 Anemia in chronic kidney disease: Secondary | ICD-10-CM | POA: Diagnosis present

## 2016-11-15 DIAGNOSIS — I350 Nonrheumatic aortic (valve) stenosis: Secondary | ICD-10-CM | POA: Diagnosis present

## 2016-11-15 DIAGNOSIS — I4891 Unspecified atrial fibrillation: Secondary | ICD-10-CM | POA: Diagnosis present

## 2016-11-15 DIAGNOSIS — I4892 Unspecified atrial flutter: Secondary | ICD-10-CM | POA: Diagnosis not present

## 2016-11-24 ENCOUNTER — Ambulatory Visit: Payer: Medicare Other | Admitting: Family Medicine

## 2016-11-26 DIAGNOSIS — N189 Chronic kidney disease, unspecified: Secondary | ICD-10-CM | POA: Diagnosis not present

## 2016-11-26 DIAGNOSIS — J189 Pneumonia, unspecified organism: Secondary | ICD-10-CM | POA: Diagnosis not present

## 2016-11-26 DIAGNOSIS — Z8546 Personal history of malignant neoplasm of prostate: Secondary | ICD-10-CM | POA: Diagnosis not present

## 2016-11-26 DIAGNOSIS — I350 Nonrheumatic aortic (valve) stenosis: Secondary | ICD-10-CM | POA: Diagnosis not present

## 2016-11-26 DIAGNOSIS — I480 Paroxysmal atrial fibrillation: Secondary | ICD-10-CM | POA: Diagnosis not present

## 2016-11-26 DIAGNOSIS — Z923 Personal history of irradiation: Secondary | ICD-10-CM | POA: Diagnosis not present

## 2016-11-26 DIAGNOSIS — I1310 Hypertensive heart and chronic kidney disease without heart failure, with stage 1 through stage 4 chronic kidney disease, or unspecified chronic kidney disease: Secondary | ICD-10-CM | POA: Diagnosis not present

## 2016-11-26 DIAGNOSIS — Z86718 Personal history of other venous thrombosis and embolism: Secondary | ICD-10-CM | POA: Diagnosis not present

## 2016-11-26 DIAGNOSIS — Z87891 Personal history of nicotine dependence: Secondary | ICD-10-CM | POA: Diagnosis not present

## 2016-11-26 NOTE — Progress Notes (Signed)
HPI:   [name] is a 83 y.o. male, who is here today with his wife to follow on recent hospitalization.   He was admitted to [facility] on 11/15/16 and discharged on 11/25/16. Discharge Dx: CAP,AKF on CKD,one episode of transient arrhythmia during hospitalization (flutter vs SVT).   He presented to the ER c/o tachycardia,diaphoresis,and lightheadedness.   CXR 11/15/16:  1.There is no radiographic evidence of acute cardiopulmonary abnormality. 2.Severe S-shaped scoliotic curvature of the thoracolumbar spine with osteopenia. Chest CT showed focal area of consolidation right medial lower lobe.He was treated with Avelox x 5 days.  Abdominal CT: Scattered pelvis opacities in the bilateral lower lobes likely reflect additional areas of infection/inflammation. There was also mild left hydroureteronephrosis without any evidence of obstructing stone   Echo:LVEF 50-55% with moderate aortic stenosis, mild to moderate aortic regurgitation, and mild to moderate mitral regurgitation. BNP mildly elevated, 379; it was 373 in 08/2016.  Cardiology evaluation during hospitalization. He is wearing event monitor,he has not had palpitations,chest pain,or dyspnea. He is not sure about next appt with cardiologists.  He is weighing daily and wt has been stable. He denies orthopnea,sleeping on a pillow. Denies PND.  AKF on CKD III was considered to be related to urinary obstruction.  H/H 11/25/16 10.0/28.5,stable during hospitalization. Cr at the time of discharged 3.35, e GFR 21, BUN 61.   Renal U/S 11/16/16: 1.The left kidney is largely obscured by bowel gas anterior and lateral to the kidney. The visualized portions of the left kidney suggest hydronephrosis with proximal hydroureter, better demonstrated on the contemporaneous CT. 2.Possible small nonobstructing calculus in the lower pole of the right kidney. No right-sided hydronephrosis. 3.Increased  echogenicity of the right kidney, suggestive of medical renal disease.  Abdominal CT w/o contrast on 11/15/16: Lack of IV contrast limits evaluation of the vasculature.  Tortuous appearance of the aorta with ectasia of the ascending thoracic aorta measuring up to 4.0 cm in diameter. Cannot exclude pseudo-aneurysm or dissection. 2.Focal area of consolidation in the right medial lower lobe concerning for pneumonia. Scattered ground glass opacities in the bilateral lower lobes likely reflect additional areas of infection/inflammation. 3. 5 mm LLL pulmonary nodule. 4.Mild left hydroureteronephrosis without definite evidence of obstructing stone, although evaluation of the distal ureters is limited due to streak artifact from numerous surgical clips in the pelvis.    He was discharged on Flomax, Furosemide 40 mg daily as needed (he has not taken), and Hydralazine was increased to 75 mg tid.Norvasc 10 mg unchanged.   He has tolerated new medications well,denies significant side effects except for increased urine frequency and nocturia (from 2-3 to 5 times).  He had urine cath during hospitalization, he refused to go home with cath in placed despite urologists recommendations. He is still has contract burning sensation.He denies gross hematuria or abdominal pain, discomfort is progressively getting better. He has appt with urologists on 12/10/16.   Overall he is feeling better, he is still fatigue.He is using a cane,denies falls at home. He denies worsening LE edema.  He denies fever,cough,dyspnea,wheezing,chest pain,or changes in bowel habits. He has good appetite,following low salt diet.   He also mentions left knee pain,which he attributes to proonged rest. + Stiffness, deneis calf/leg pain,erythema. Hx of OA involving knee L>R,s/p left knee surgery. Alleviated with rest and worse with prolonged walking.   No other concerns today.   Review of Systems  Constitutional: Positive for  fatigue. Negative for appetite change and  fever.  HENT: Negative for mouth sores, nosebleeds, sore throat and trouble swallowing.   Eyes: Negative for redness and visual disturbance.  Respiratory: Negative for cough, shortness of breath and wheezing.   Cardiovascular: Positive for leg swelling. Negative for chest pain and palpitations.  Gastrointestinal: Negative for abdominal pain, nausea and vomiting.       No changes in bowel habits.  Genitourinary: Positive for frequency. Negative for decreased urine volume, flank pain and hematuria.  Musculoskeletal: Positive for arthralgias (Hx of knee OA,L>R) and gait problem. Negative for myalgias.  Skin: Negative for rash.  Neurological: Negative for dizziness, syncope, weakness and headaches.  Psychiatric/Behavioral: Negative for confusion. The patient is not nervous/anxious.        Current Outpatient Prescriptions on File Prior to Visit  Medication Sig Dispense Refill  . albuterol (PROVENTIL HFA;VENTOLIN HFA) 108 (90 Base) MCG/ACT inhaler Inhale 2 puffs into the lungs every 6 (six) hours as needed for wheezing or shortness of breath. 1 Inhaler 0  . amLODipine (NORVASC) 10 MG tablet Take 1 tablet (10 mg total) by mouth daily. 90 tablet 1  . B Complex Vitamins (VITAMIN-B COMPLEX PO) Take by mouth.    . Bacillus Coagulans-Inulin (PROBIOTIC FORMULA) 1-250 BILLION-MG CAPS Take 250 mg by mouth daily.    . Cholecalciferol (VITAMIN D3) 5000 units TABS Take by mouth daily.    . Coenzyme Q10 (CO Q 10) 100 MG CAPS Take 100 mg by mouth daily.    Water engineer Bandages & Supports (LUMBAR BACK BRACE/SUPPORT PAD) MISC Use as directed for lumbar scoliosis 1 each 0  . hydrALAZINE (APRESOLINE) 10 MG tablet Take 1 tablet (10 mg total) by mouth 3 (three) times daily. 90 tablet 1  . Multiple Vitamin (MULTI VITAMIN PO) Take by mouth daily.    . Omega 3 1000 MG CAPS Take 2,000 mg by mouth daily.     No current facility-administered medications on file prior to visit.        Past Medical History:  Diagnosis Date  . Arthritis   . Cancer [facility])    Prostate cancer, follows with hematologists q 6 months. S/p radical prostatectomy and proton therapy in 2001.  . Cataract   . CKD (chronic kidney disease)    Follows with nephrologists.  . Hypertension   . Radiation cystitis   . Retinal hemorrhage of both eyes    follows with ophthalmologists.   Allergies  Allergen Reactions  . Iodinated Diagnostic Agents Rash    Other Reaction: Allergy  . Penicillins Rash    Social History   Social History  . Marital status: Married    Spouse name: N/A  . Number of children: N/A  . Years of education: N/A   Social History Main Topics  . Smoking status: Former Smoker    Types: Cigarettes  . Smokeless tobacco: Never Used  . Alcohol use Yes  . Drug use: No  . Sexual activity: Not Asked   Other Topics Concern  . None   Social History Narrative  . None    Vitals:   11/27/16 1419  BP: 122/70  Pulse: 62  Resp: 12  O2 sat at RA 94% Body mass index is 22.01 kg/m.   Physical Exam  Nursing note and vitals reviewed. Constitutional: He is oriented to person, place, and time. He appears well-developed and well-nourished. No distress.  HENT:  Head: Atraumatic.  Mouth/Throat: Oropharynx is clear and moist and mucous membranes are normal. He has dentures.  Eyes: Conjunctivae and EOM are  normal.  Neck: No JVD present.  Cardiovascular: Normal rate and regular rhythm.   Murmur (SEM II-/VI LUSB) heard. DP pulses present bilateral.  Respiratory: Effort normal and breath sounds normal. No respiratory distress.  GI: Soft. He exhibits no mass. There is no hepatomegaly. There is no tenderness.  Musculoskeletal: He exhibits edema (trace pitting edema LLE, 1+ RLE). He exhibits no tenderness.  Lymphadenopathy:    He has no cervical adenopathy.  Neurological: He is alert and oriented to person, place, and time. He has normal strength. Coordination normal.  Slow but  stable gait assisted with a cane.  Skin: Skin is warm. No erythema.  Psychiatric: He has a normal mood and affect. Cognition and memory are normal.  Well groomed, good eye contact.      ASSESSMENT AND PLAN:   Tupac was seen today for hospitalization follow-up.  Diagnoses and all orders for this visit:  Lab Results  Component Value Date   CREATININE 3.92 (H) 11/27/2016   BUN 76 (H) 11/27/2016   NA 134 (L) 11/27/2016   K 4.9 11/27/2016   CL 102 11/27/2016   CO2 23 11/27/2016    Acute renal failure superimposed on stage 3 chronic kidney disease, unspecified acute renal failure type [facility])  Further recommendations will be given according to lab results. Low salt diet. He knows to avoid NSAID's. Adequate BP controlled.  Keep appt with nephrologists and urologists.  -     Basic metabolic panel  Essential hypertension, benign  Adequately controlled. No changes in current management. DASH-low diet recommended. F/U in 2 months, before if needed.  -     Basic metabolic panel  Lobar pneumonia ([place])  Completed abx treatment, resolved.   Pulmonary nodule less than 6 cm determined by computed tomography of lung  We will discussed plan for f/u once his other medical problems are more stable. Former smoker.  Anemia, unspecified type  Chronic disease anemia. I am not re-checking CBC today given the fact it was just done 11/25/16 and stable during hospitalization.   Unstable gait  Fall prevention discussed,recommend using cane all the time.  Cardiac arrhythmia, unspecified cardiac arrhythmia type  Reporting no symptoms. Currently he is wearing cardiac monitor. Instructed about warning signs. Waiting for cardiology appt.      Tanganika Barradas G. Martinique, MD  [facility]. [place] office.

## 2016-11-27 ENCOUNTER — Ambulatory Visit (INDEPENDENT_AMBULATORY_CARE_PROVIDER_SITE_OTHER): Payer: Medicare Other | Admitting: Family Medicine

## 2016-11-27 ENCOUNTER — Encounter: Payer: Self-pay | Admitting: Family Medicine

## 2016-11-27 VITALS — BP 122/70 | HR 62 | Resp 12 | Ht 63.0 in | Wt 124.2 lb

## 2016-11-27 DIAGNOSIS — D649 Anemia, unspecified: Secondary | ICD-10-CM

## 2016-11-27 DIAGNOSIS — N183 Chronic kidney disease, stage 3 (moderate): Secondary | ICD-10-CM

## 2016-11-27 DIAGNOSIS — IMO0001 Reserved for inherently not codable concepts without codable children: Secondary | ICD-10-CM | POA: Insufficient documentation

## 2016-11-27 DIAGNOSIS — I10 Essential (primary) hypertension: Secondary | ICD-10-CM

## 2016-11-27 DIAGNOSIS — R911 Solitary pulmonary nodule: Secondary | ICD-10-CM

## 2016-11-27 DIAGNOSIS — J181 Lobar pneumonia, unspecified organism: Secondary | ICD-10-CM | POA: Diagnosis not present

## 2016-11-27 DIAGNOSIS — R2681 Unsteadiness on feet: Secondary | ICD-10-CM | POA: Diagnosis not present

## 2016-11-27 DIAGNOSIS — N179 Acute kidney failure, unspecified: Secondary | ICD-10-CM | POA: Diagnosis not present

## 2016-11-27 DIAGNOSIS — I499 Cardiac arrhythmia, unspecified: Secondary | ICD-10-CM | POA: Diagnosis not present

## 2016-11-27 LAB — BASIC METABOLIC PANEL
BUN: 76 mg/dL — AB (ref 6–23)
CHLORIDE: 102 meq/L (ref 96–112)
CO2: 23 meq/L (ref 19–32)
Calcium: 8.9 mg/dL (ref 8.4–10.5)
Creatinine, Ser: 3.92 mg/dL — ABNORMAL HIGH (ref 0.40–1.50)
GFR: 18.48 mL/min — ABNORMAL LOW (ref >60.00)
GLUCOSE: 120 mg/dL — AB (ref 70–99)
POTASSIUM: 4.9 meq/L (ref 3.5–5.1)
Sodium: 134 mEq/L — ABNORMAL LOW (ref 135–145)

## 2016-11-27 NOTE — Patient Instructions (Addendum)
  A few things to remember from today's visit:   Essential hypertension, benign - Plan: Basic metabolic panel  Acute renal failure superimposed on stage 3 chronic kidney disease, unspecified acute renal failure type ([place])   Please be sure medication list is accurate. If a new problem present, please set up appointment sooner than planned today.           WE NOW OFFER   [facility] FAST TRACK!!!  SAME DAY Appointments for ACUTE CARE  Such as: Sprains, Injuries, cuts, abrasions, rashes, muscle pain, joint pain, back pain Colds, flu, sore throats, headache, allergies, cough, fever  Ear pain, sinus and eye infections Abdominal pain, nausea, vomiting, diarrhea, upset stomach Animal/insect bites  3 Easy Ways to Schedule: Walk-In Scheduling Call in scheduling Mychart Sign-up: [email or web address]

## 2016-11-27 NOTE — Progress Notes (Signed)
Pre visit review using our clinic review tool, if applicable. No additional management support is needed unless otherwise documented below in the visit note. 

## 2016-11-28 ENCOUNTER — Other Ambulatory Visit: Payer: Self-pay

## 2016-11-28 DIAGNOSIS — N289 Disorder of kidney and ureter, unspecified: Secondary | ICD-10-CM

## 2016-12-02 ENCOUNTER — Telehealth: Payer: Self-pay | Admitting: Family Medicine

## 2016-12-02 DIAGNOSIS — N183 Chronic kidney disease, stage 3 unspecified: Secondary | ICD-10-CM

## 2016-12-02 NOTE — Telephone Encounter (Signed)
He is currently following with nephrologists, does he need another referral to be able to change provider? If he does referral to renal clinic can be placed, Dx CKD III-IV and please specify "upon pt request" Thanks, BJ

## 2016-12-02 NOTE — Telephone Encounter (Signed)
°  Pt daughter call to say pt would like to go to   [facility]  Phone number [phone] Option [facility]  Fax number [phone]

## 2016-12-02 NOTE — Telephone Encounter (Signed)
Referral placed.

## 2016-12-02 NOTE — Telephone Encounter (Signed)
Okay for referral?

## 2016-12-04 ENCOUNTER — Other Ambulatory Visit: Payer: Medicare Other

## 2016-12-04 DIAGNOSIS — D61818 Other pancytopenia: Secondary | ICD-10-CM | POA: Diagnosis not present

## 2016-12-04 DIAGNOSIS — C61 Malignant neoplasm of prostate: Secondary | ICD-10-CM | POA: Diagnosis not present

## 2016-12-05 ENCOUNTER — Other Ambulatory Visit: Payer: Medicare Other

## 2016-12-05 DIAGNOSIS — N1339 Other hydronephrosis: Secondary | ICD-10-CM | POA: Diagnosis not present

## 2016-12-05 DIAGNOSIS — J9601 Acute respiratory failure with hypoxia: Secondary | ICD-10-CM | POA: Diagnosis not present

## 2016-12-05 DIAGNOSIS — Z923 Personal history of irradiation: Secondary | ICD-10-CM | POA: Diagnosis not present

## 2016-12-05 DIAGNOSIS — N184 Chronic kidney disease, stage 4 (severe): Secondary | ICD-10-CM | POA: Diagnosis not present

## 2016-12-05 DIAGNOSIS — I351 Nonrheumatic aortic (valve) insufficiency: Secondary | ICD-10-CM | POA: Diagnosis not present

## 2016-12-05 DIAGNOSIS — D649 Anemia, unspecified: Secondary | ICD-10-CM | POA: Diagnosis present

## 2016-12-05 DIAGNOSIS — E871 Hypo-osmolality and hyponatremia: Secondary | ICD-10-CM | POA: Diagnosis not present

## 2016-12-05 DIAGNOSIS — R633 Feeding difficulties: Secondary | ICD-10-CM | POA: Diagnosis not present

## 2016-12-05 DIAGNOSIS — Z430 Encounter for attention to tracheostomy: Secondary | ICD-10-CM | POA: Diagnosis not present

## 2016-12-05 DIAGNOSIS — Z9079 Acquired absence of other genital organ(s): Secondary | ICD-10-CM | POA: Diagnosis not present

## 2016-12-05 DIAGNOSIS — I469 Cardiac arrest, cause unspecified: Secondary | ICD-10-CM | POA: Diagnosis not present

## 2016-12-05 DIAGNOSIS — T17908A Unspecified foreign body in respiratory tract, part unspecified causing other injury, initial encounter: Secondary | ICD-10-CM | POA: Diagnosis not present

## 2016-12-05 DIAGNOSIS — I5020 Unspecified systolic (congestive) heart failure: Secondary | ICD-10-CM | POA: Diagnosis present

## 2016-12-05 DIAGNOSIS — I160 Hypertensive urgency: Secondary | ICD-10-CM | POA: Diagnosis not present

## 2016-12-05 DIAGNOSIS — I272 Pulmonary hypertension, unspecified: Secondary | ICD-10-CM | POA: Diagnosis present

## 2016-12-05 DIAGNOSIS — R000 Tachycardia, unspecified: Secondary | ICD-10-CM | POA: Diagnosis not present

## 2016-12-05 DIAGNOSIS — I2129 ST elevation (STEMI) myocardial infarction involving other sites: Secondary | ICD-10-CM | POA: Diagnosis not present

## 2016-12-05 DIAGNOSIS — N320 Bladder-neck obstruction: Secondary | ICD-10-CM | POA: Diagnosis present

## 2016-12-05 DIAGNOSIS — N2889 Other specified disorders of kidney and ureter: Secondary | ICD-10-CM | POA: Diagnosis not present

## 2016-12-05 DIAGNOSIS — K661 Hemoperitoneum: Secondary | ICD-10-CM | POA: Diagnosis not present

## 2016-12-05 DIAGNOSIS — R918 Other nonspecific abnormal finding of lung field: Secondary | ICD-10-CM | POA: Diagnosis not present

## 2016-12-05 DIAGNOSIS — Z960 Presence of urogenital implants: Secondary | ICD-10-CM | POA: Diagnosis not present

## 2016-12-05 DIAGNOSIS — I129 Hypertensive chronic kidney disease with stage 1 through stage 4 chronic kidney disease, or unspecified chronic kidney disease: Secondary | ICD-10-CM | POA: Diagnosis not present

## 2016-12-05 DIAGNOSIS — R1084 Generalized abdominal pain: Secondary | ICD-10-CM | POA: Diagnosis not present

## 2016-12-05 DIAGNOSIS — I491 Atrial premature depolarization: Secondary | ICD-10-CM | POA: Diagnosis not present

## 2016-12-05 DIAGNOSIS — Z7901 Long term (current) use of anticoagulants: Secondary | ICD-10-CM | POA: Diagnosis not present

## 2016-12-05 DIAGNOSIS — N1330 Unspecified hydronephrosis: Secondary | ICD-10-CM | POA: Diagnosis not present

## 2016-12-05 DIAGNOSIS — J690 Pneumonitis due to inhalation of food and vomit: Secondary | ICD-10-CM | POA: Diagnosis not present

## 2016-12-05 DIAGNOSIS — Z86718 Personal history of other venous thrombosis and embolism: Secondary | ICD-10-CM | POA: Diagnosis not present

## 2016-12-05 DIAGNOSIS — R9431 Abnormal electrocardiogram [ECG] [EKG]: Secondary | ICD-10-CM | POA: Diagnosis not present

## 2016-12-05 DIAGNOSIS — C61 Malignant neoplasm of prostate: Secondary | ICD-10-CM | POA: Diagnosis not present

## 2016-12-05 DIAGNOSIS — R609 Edema, unspecified: Secondary | ICD-10-CM | POA: Diagnosis not present

## 2016-12-05 DIAGNOSIS — I313 Pericardial effusion (noninflammatory): Secondary | ICD-10-CM | POA: Diagnosis not present

## 2016-12-05 DIAGNOSIS — R05 Cough: Secondary | ICD-10-CM | POA: Diagnosis not present

## 2016-12-05 DIAGNOSIS — J90 Pleural effusion, not elsewhere classified: Secondary | ICD-10-CM | POA: Diagnosis not present

## 2016-12-05 DIAGNOSIS — N138 Other obstructive and reflux uropathy: Secondary | ICD-10-CM | POA: Diagnosis not present

## 2016-12-05 DIAGNOSIS — Z9981 Dependence on supplemental oxygen: Secondary | ICD-10-CM | POA: Diagnosis not present

## 2016-12-05 DIAGNOSIS — Z8546 Personal history of malignant neoplasm of prostate: Secondary | ICD-10-CM | POA: Diagnosis not present

## 2016-12-05 DIAGNOSIS — N183 Chronic kidney disease, stage 3 (moderate): Secondary | ICD-10-CM | POA: Diagnosis not present

## 2016-12-05 DIAGNOSIS — I132 Hypertensive heart and chronic kidney disease with heart failure and with stage 5 chronic kidney disease, or end stage renal disease: Secondary | ICD-10-CM | POA: Diagnosis not present

## 2016-12-05 DIAGNOSIS — I493 Ventricular premature depolarization: Secondary | ICD-10-CM | POA: Diagnosis not present

## 2016-12-05 DIAGNOSIS — R1310 Dysphagia, unspecified: Secondary | ICD-10-CM | POA: Diagnosis not present

## 2016-12-05 DIAGNOSIS — I484 Atypical atrial flutter: Secondary | ICD-10-CM | POA: Diagnosis not present

## 2016-12-05 DIAGNOSIS — T17200A Unspecified foreign body in pharynx causing asphyxiation, initial encounter: Secondary | ICD-10-CM | POA: Diagnosis not present

## 2016-12-05 DIAGNOSIS — N179 Acute kidney failure, unspecified: Secondary | ICD-10-CM | POA: Diagnosis present

## 2016-12-05 DIAGNOSIS — I4510 Unspecified right bundle-branch block: Secondary | ICD-10-CM | POA: Diagnosis not present

## 2016-12-05 DIAGNOSIS — T45515A Adverse effect of anticoagulants, initial encounter: Secondary | ICD-10-CM | POA: Diagnosis not present

## 2016-12-05 DIAGNOSIS — I4891 Unspecified atrial fibrillation: Secondary | ICD-10-CM | POA: Diagnosis not present

## 2016-12-05 DIAGNOSIS — N189 Chronic kidney disease, unspecified: Secondary | ICD-10-CM | POA: Diagnosis not present

## 2016-12-05 DIAGNOSIS — M898X1 Other specified disorders of bone, shoulder: Secondary | ICD-10-CM | POA: Diagnosis not present

## 2016-12-05 DIAGNOSIS — I130 Hypertensive heart and chronic kidney disease with heart failure and stage 1 through stage 4 chronic kidney disease, or unspecified chronic kidney disease: Secondary | ICD-10-CM | POA: Diagnosis present

## 2016-12-05 DIAGNOSIS — E8770 Fluid overload, unspecified: Secondary | ICD-10-CM | POA: Diagnosis not present

## 2016-12-05 DIAGNOSIS — N261 Atrophy of kidney (terminal): Secondary | ICD-10-CM | POA: Diagnosis present

## 2016-12-05 DIAGNOSIS — I495 Sick sinus syndrome: Secondary | ICD-10-CM | POA: Diagnosis present

## 2016-12-05 DIAGNOSIS — D62 Acute posthemorrhagic anemia: Secondary | ICD-10-CM | POA: Diagnosis not present

## 2016-12-05 DIAGNOSIS — R809 Proteinuria, unspecified: Secondary | ICD-10-CM | POA: Diagnosis not present

## 2016-12-05 DIAGNOSIS — I480 Paroxysmal atrial fibrillation: Secondary | ICD-10-CM | POA: Diagnosis not present

## 2016-12-05 DIAGNOSIS — E8809 Other disorders of plasma-protein metabolism, not elsewhere classified: Secondary | ICD-10-CM | POA: Diagnosis not present

## 2016-12-05 DIAGNOSIS — I998 Other disorder of circulatory system: Secondary | ICD-10-CM | POA: Diagnosis not present

## 2016-12-05 DIAGNOSIS — N139 Obstructive and reflux uropathy, unspecified: Secondary | ICD-10-CM | POA: Diagnosis not present

## 2016-12-05 DIAGNOSIS — I4892 Unspecified atrial flutter: Secondary | ICD-10-CM | POA: Diagnosis not present

## 2016-12-05 DIAGNOSIS — T17900A Unspecified foreign body in respiratory tract, part unspecified causing asphyxiation, initial encounter: Secondary | ICD-10-CM | POA: Diagnosis not present

## 2016-12-05 DIAGNOSIS — E872 Acidosis: Secondary | ICD-10-CM | POA: Diagnosis not present

## 2016-12-05 DIAGNOSIS — N185 Chronic kidney disease, stage 5: Secondary | ICD-10-CM | POA: Diagnosis not present

## 2016-12-05 DIAGNOSIS — I5030 Unspecified diastolic (congestive) heart failure: Secondary | ICD-10-CM | POA: Diagnosis not present

## 2016-12-05 DIAGNOSIS — I10 Essential (primary) hypertension: Secondary | ICD-10-CM | POA: Diagnosis not present

## 2016-12-05 DIAGNOSIS — J810 Acute pulmonary edema: Secondary | ICD-10-CM | POA: Diagnosis not present

## 2016-12-05 DIAGNOSIS — T17420A Food in trachea causing asphyxiation, initial encounter: Secondary | ICD-10-CM | POA: Diagnosis not present

## 2016-12-05 DIAGNOSIS — Z8674 Personal history of sudden cardiac arrest: Secondary | ICD-10-CM | POA: Diagnosis not present

## 2016-12-05 DIAGNOSIS — I517 Cardiomegaly: Secondary | ICD-10-CM | POA: Diagnosis not present

## 2016-12-05 DIAGNOSIS — I350 Nonrheumatic aortic (valve) stenosis: Secondary | ICD-10-CM | POA: Diagnosis not present

## 2016-12-05 DIAGNOSIS — R001 Bradycardia, unspecified: Secondary | ICD-10-CM | POA: Diagnosis not present

## 2016-12-05 DIAGNOSIS — N399 Disorder of urinary system, unspecified: Secondary | ICD-10-CM | POA: Diagnosis not present

## 2016-12-05 DIAGNOSIS — G4730 Sleep apnea, unspecified: Secondary | ICD-10-CM | POA: Diagnosis present

## 2016-12-06 DIAGNOSIS — I4892 Unspecified atrial flutter: Secondary | ICD-10-CM | POA: Diagnosis not present

## 2016-12-08 DIAGNOSIS — R000 Tachycardia, unspecified: Secondary | ICD-10-CM | POA: Diagnosis not present

## 2016-12-15 ENCOUNTER — Telehealth: Payer: Self-pay | Admitting: Family Medicine

## 2016-12-15 NOTE — Telephone Encounter (Signed)
°  Johnny Morrow from [facility] call to say   Pt being discharged from hospital today and they request to add nursing to what he already has   [phone]

## 2016-12-15 NOTE — Telephone Encounter (Signed)
Called and spoke with [name]. Patient will be going to a different agency because he needs speech therapy and they do not offer it. Nothing further needed at this time.

## 2016-12-15 NOTE — Telephone Encounter (Signed)
It is Ok to add nursing service as requested by [facility]. Thanks, BJ

## 2016-12-16 DIAGNOSIS — J189 Pneumonia, unspecified organism: Secondary | ICD-10-CM

## 2016-12-16 HISTORY — DX: Pneumonia, unspecified organism: J18.9

## 2016-12-19 NOTE — Progress Notes (Signed)
HPI:   [name] is a 83 y.o. male, who is here today to follow on recent hospitalization.His wife is not here today, she stayed at home, he used the retire community transportation today. Hospitalized at [facility] from 12/05/16 to 12/19/16.  He was following with his nephrologists noted irregular tachycardia, he was sent to the ER. He was found to have postrenal acute kidney injury and EKG showed atrial flutter.  Records review: He was admitted on 12/05/2016 with diagnosis of paroxysmal atrial flutter, discharged on 12/19/2016 with the following diagnosis: Paroxysmal atrial flutter PEA arrest  Acute blood loss anemia with hemoperitoneum Volume overload Non-oliguric acute kidney injury, CKD V.  He was placed on heparin drip, complicated with hemoperitoneum, and acute anemia (12/10/16). KUB showed dilated loops of bowel. Abdominal CT showed large volume of hemoperitoneum within the abdomen and pelvis. Abdominal CTA didn't show obvious bleeding vessel. H/H 4.6, 3 units of RBC's transfused. H/H 10.0/29.3 on 12/19/16. Labetalol and Coreg both caused significant bradycardia.He has not tolerated BB in the past.   Hospitalization was also complicated by episode of PEA on 12/06/16, he required intubation and transferred to the MICU unit.   Episode of hypoxemia, O2 sats 86% with ambulation., CXR showed evidence of volume overload. He was given oral Lasix  80 mg, which helped with dyspnea Bronchoscopy showed erythematosus, friable airway of the right side with no food particles.  He was extubated on 12/07/2016. He was discharged on Lasix is 40 mg daily, he has not taken it as instructed.  During hospitalization rapid progression of kidney disease, attributed to contrast nephropathy. During hospitalization nephrology consultation was done, he was instructed to continue following with his nephrologist as outpatient.  Cr 3.96 at the time of his discharged (12/19/16),  e GFR 18.  Hypertension: He is currently on hydralazine 50 mg 3 times a day and amlodipine 10 mg daily.  He has home health arrangements: PT, speech therapy, and skilled nurse services are pending. He had PT evaluation on 12/20/16. He is using a walker as needed.  Overall he is feeling better, no new symptoms reported. Appetite is "good", eating is "excellent."   Reporting 9 Lb loss since hospital discharge, which he attributes to Lasix treatment for fluid retention. He is following low salt diet as instructed. He denies orthopnea or PND. + Exertional dyspnea, also reported as improving. Still fatigue.  He denies chest pain, palpitations,diaphoresis, or dizziness.  Echo 12/06/16: LVEF 55-60%, mild LVH,moderate pulmonary HT. Denies fever,chills, abdominal pain, nausea, vomiting, changes in bowel habits, blood in stool or melena.Last bowel movement today.   He decided that he will not take more Furosemide (skipped it yesterday and today),he has urinary frequency,improving some but still urinating every 2 hours. He is also having urinary leakage,noted after urine cath was removed,it is progressively getting better. He had same problem after first hospitalization after urine cath. He has appt with urologists in a few weeks.  He denies abdominal pain, dysuria,gross hematuria,foam in urine,or decreased urine ouout.  He has appt with cardiologists 01/01/17, nephrologists 12/24/16.   Review of Systems  Constitutional: Positive for activity change and fatigue. Negative for appetite change, chills, diaphoresis and fever.  HENT: Negative for mouth sores, nosebleeds, sore throat and trouble swallowing.   Eyes: Negative for redness and visual disturbance.  Respiratory: Positive for shortness of breath. Negative for cough and wheezing.   Cardiovascular: Negative for chest pain, palpitations and leg swelling.  Gastrointestinal: Negative for abdominal  pain, blood in stool, nausea and vomiting.    Genitourinary: Positive for frequency. Negative for decreased urine volume, dysuria and hematuria.  Musculoskeletal: Positive for gait problem. Negative for back pain and myalgias.  Skin: Negative for pallor and rash.  Neurological: Negative for dizziness, syncope, weakness and headaches.  Hematological: Does not bruise/bleed easily.  Psychiatric/Behavioral: Negative for confusion and sleep disturbance. The patient is not nervous/anxious.       Current Outpatient Prescriptions on File Prior to Visit  Medication Sig Dispense Refill  . albuterol (PROVENTIL HFA;VENTOLIN HFA) 108 (90 Base) MCG/ACT inhaler Inhale 2 puffs into the lungs every 6 (six) hours as needed for wheezing or shortness of breath. 1 Inhaler 0  . amLODipine (NORVASC) 10 MG tablet Take 1 tablet (10 mg total) by mouth daily. 90 tablet 1  . B Complex Vitamins (VITAMIN-B COMPLEX PO) Take by mouth.    . Bacillus Coagulans-Inulin (PROBIOTIC FORMULA) 1-250 BILLION-MG CAPS Take 250 mg by mouth daily.    . Cholecalciferol (VITAMIN D3) 5000 units TABS Take by mouth daily.    . Coenzyme Q10 (CO Q 10) 100 MG CAPS Take 100 mg by mouth daily.    Water engineer Bandages & Supports (LUMBAR BACK BRACE/SUPPORT PAD) MISC Use as directed for lumbar scoliosis 1 each 0  . Multiple Vitamin (MULTI VITAMIN PO) Take by mouth daily.    . Omega 3 1000 MG CAPS Take 2,000 mg by mouth daily.     No current facility-administered medications on file prior to visit.      Past Medical History:  Diagnosis Date  . Arthritis   . Cancer [facility])    Prostate cancer, follows with hematologists q 6 months. S/p radical prostatectomy and proton therapy in 2001.  . Cataract   . CKD (chronic kidney disease)    Follows with nephrologists.  . Hypertension   . Radiation cystitis   . Retinal hemorrhage of both eyes    follows with ophthalmologists.   Allergies  Allergen Reactions  . Iodinated Diagnostic Agents Rash    Other Reaction: Allergy  . Penicillins Rash     Social History   Social History  . Marital status: Married    Spouse name: N/A  . Number of children: N/A  . Years of education: N/A   Social History Main Topics  . Smoking status: Former Smoker    Types: Cigarettes  . Smokeless tobacco: Never Used  . Alcohol use Yes  . Drug use: No  . Sexual activity: Not Asked   Other Topics Concern  . None   Social History Narrative  . None    Vitals:   12/22/16 1341 12/22/16 1433  BP: (!) 142/82   Pulse: 63 (!) 56  Resp: 16 16  O2 after walking 89% at RA at rest 97% Body mass index is 22.19 kg/m.   Physical Exam  Nursing note and vitals reviewed. Constitutional: He is oriented to person, place, and time. He appears well-developed and well-nourished. No distress.  HENT:  Head: Atraumatic.  Mouth/Throat: Oropharynx is clear and moist and mucous membranes are normal. He has dentures.  Eyes: Conjunctivae and EOM are normal.  Neck: JVD present.  Cardiovascular: An irregular rhythm present. Frequent extrasystoles (x 8 in 1 min) are present. Bradycardia present.   Murmur (SEM II-IIII-/VI LUSB,RUS,and apex) heard. DP and PT pulses present bilateral.  Respiratory: Effort normal and breath sounds normal. No respiratory distress.  GI: Soft. He exhibits no mass. There is no hepatomegaly. There is no tenderness.  Musculoskeletal: He exhibits edema (1+ pitting edema  LE, bilateral). He exhibits no tenderness.  Lymphadenopathy:    He has no cervical adenopathy.  Neurological: He is alert and oriented to person, place, and time. He has normal strength.  Stable gait assisted with a cane.  Skin: Skin is warm. No erythema.  Psychiatric: He has a normal mood and affect. Cognition and memory are normal.  Well groomed, good eye contact.    ASSESSMENT AND PLAN:   Johnny Morrow was seen today for hospitalization follow-up.  Diagnoses and all orders for this visit:  Dyspnea, unspecified type  Exertional dyspnea, which he reports as  improving. Still O2 sat today initially 89% when he walked from front to the room, at rest O2 sat was normal. Recommend rest and pulse O2 while doing PT, he may need supplemental O2 as needed. Clearly instructed about warning signs. Further recommendations will be given according to lab results.   -     Basic metabolic panel -     CBC with Differential/Platelet -     B Nat Peptide  Paroxysmal atrial flutter (HCC)  Rate controlled, mildly bradycardic. He did not tolerate BB's and developed complication from anticoagulation. Keep appt with cardiologists. Instructed about warning signs.   Essential hypertension, benign  Adequately controlled. No changes in current management. Low salt diet. F/U in 6-8 weeks.  -     hydrALAZINE (APRESOLINE) 50 MG tablet; Take 1 tablet (50 mg total) by mouth 3 (three) times daily.  Chronic kidney disease (CKD), stage IV (severe) (HCC)  AKI on CKD due to contrast nephropathy among other possible causes. He will keep appt with nephrologists. Low salt and low phosphorus diet recommended. He know he should not take NSAID's. Adequate hydration.  -     Basic metabolic panel  Acute blood loss anemia  S/P hemoperitoneum. Abdominal pain resolved. Further recommendations will be given according to lab results.    -     CBC with Differential/Platelet -     ferrous sulfate 325 (65 FE) MG tablet; Take 1 tablet (325 mg total) by mouth daily with breakfast.     Betty G. Martinique, MD  [facility]. [place] office.

## 2016-12-20 DIAGNOSIS — N183 Chronic kidney disease, stage 3 (moderate): Secondary | ICD-10-CM | POA: Diagnosis not present

## 2016-12-20 DIAGNOSIS — I129 Hypertensive chronic kidney disease with stage 1 through stage 4 chronic kidney disease, or unspecified chronic kidney disease: Secondary | ICD-10-CM | POA: Diagnosis not present

## 2016-12-20 DIAGNOSIS — I484 Atypical atrial flutter: Secondary | ICD-10-CM | POA: Diagnosis not present

## 2016-12-20 DIAGNOSIS — Z8546 Personal history of malignant neoplasm of prostate: Secondary | ICD-10-CM | POA: Diagnosis not present

## 2016-12-22 ENCOUNTER — Telehealth: Payer: Self-pay | Admitting: Family Medicine

## 2016-12-22 ENCOUNTER — Encounter: Payer: Self-pay | Admitting: Family Medicine

## 2016-12-22 ENCOUNTER — Ambulatory Visit (INDEPENDENT_AMBULATORY_CARE_PROVIDER_SITE_OTHER): Payer: Medicare Other | Admitting: Family Medicine

## 2016-12-22 VITALS — BP 142/82 | HR 56 | Resp 16 | Ht 63.0 in | Wt 125.2 lb

## 2016-12-22 DIAGNOSIS — I4892 Unspecified atrial flutter: Secondary | ICD-10-CM

## 2016-12-22 DIAGNOSIS — N184 Chronic kidney disease, stage 4 (severe): Secondary | ICD-10-CM | POA: Diagnosis not present

## 2016-12-22 DIAGNOSIS — D62 Acute posthemorrhagic anemia: Secondary | ICD-10-CM

## 2016-12-22 DIAGNOSIS — I10 Essential (primary) hypertension: Secondary | ICD-10-CM

## 2016-12-22 DIAGNOSIS — R0600 Dyspnea, unspecified: Secondary | ICD-10-CM | POA: Diagnosis not present

## 2016-12-22 DIAGNOSIS — I480 Paroxysmal atrial fibrillation: Secondary | ICD-10-CM | POA: Insufficient documentation

## 2016-12-22 MED ORDER — HYDRALAZINE HCL 50 MG PO TABS: 50.0000 mg | ORAL_TABLET | Freq: Three times a day (TID) | ORAL | 1 refills | Status: DC

## 2016-12-22 MED ORDER — FERROUS SULFATE 325 (65 FE) MG PO TABS: 325.0000 mg | ORAL_TABLET | Freq: Every day | ORAL | 2 refills | Status: AC

## 2016-12-22 NOTE — Progress Notes (Signed)
Pre visit review using our clinic review tool, if applicable. No additional management support is needed unless otherwise documented below in the visit note. 

## 2016-12-22 NOTE — Telephone Encounter (Signed)
Johnny Morrow is calling requesting verbal order for pt to have PT once a wk for 1 wk and then twice a wk for 4 wks. Ok to leave message on cell

## 2016-12-22 NOTE — Telephone Encounter (Signed)
It is Ok to authorize PT request. Thanks, BJ

## 2016-12-22 NOTE — Patient Instructions (Addendum)
A few things to remember from today's visit:   Dyspnea, unspecified type - Plan: Basic metabolic panel, CBC with Differential/Platelet, B Nat Peptide  Atrial flutter, unspecified type ([place])  Essential hypertension, benign - Plan: hydrALAZINE (APRESOLINE) 50 MG tablet  Chronic kidney disease (CKD), stage IV (severe) (HCC) - Plan: Basic metabolic panel  Acute blood loss anemia - Plan: CBC with Differential/Platelet, ferrous sulfate 325 (65 FE) MG tablet   Protonix to stop and Furosemide can continue as needed. Continue daily weigh.  Pending PT. Check O2 while doing PT.  Keep appt with kidney and heart doctor.   Please be sure medication list is accurate. If a new problem present, please set up appointment sooner than planned today.

## 2016-12-23 ENCOUNTER — Telehealth: Payer: Self-pay | Admitting: Family Medicine

## 2016-12-23 DIAGNOSIS — I129 Hypertensive chronic kidney disease with stage 1 through stage 4 chronic kidney disease, or unspecified chronic kidney disease: Secondary | ICD-10-CM | POA: Diagnosis not present

## 2016-12-23 DIAGNOSIS — I484 Atypical atrial flutter: Secondary | ICD-10-CM | POA: Diagnosis not present

## 2016-12-23 DIAGNOSIS — Z8546 Personal history of malignant neoplasm of prostate: Secondary | ICD-10-CM | POA: Diagnosis not present

## 2016-12-23 DIAGNOSIS — N183 Chronic kidney disease, stage 3 (moderate): Secondary | ICD-10-CM | POA: Diagnosis not present

## 2016-12-23 LAB — BASIC METABOLIC PANEL
BUN: 67 mg/dL — ABNORMAL HIGH (ref 6–23)
CALCIUM: 9.3 mg/dL (ref 8.4–10.5)
CO2: 24 mEq/L (ref 19–32)
Chloride: 102 mEq/L (ref 96–112)
Creatinine, Ser: 3.84 mg/dL — ABNORMAL HIGH (ref 0.40–1.50)
GFR: 18.92 mL/min — AB (ref >60.00)
GLUCOSE: 141 mg/dL — AB (ref 70–99)
POTASSIUM: 4 meq/L (ref 3.5–5.1)
SODIUM: 139 meq/L (ref 135–145)

## 2016-12-23 LAB — CBC WITH DIFFERENTIAL/PLATELET
BASOS ABS: 0.1 10*3/uL (ref 0.0–0.1)
Basophils Relative: 1.1 % (ref 0.0–3.0)
EOS PCT: 4.1 % (ref 0.0–5.0)
Eosinophils Absolute: 0.3 10*3/uL (ref 0.0–0.7)
HEMATOCRIT: 33.7 % — AB (ref 39.0–52.0)
HEMOGLOBIN: 11.4 g/dL — AB (ref 13.0–17.0)
LYMPHS PCT: 13.1 % (ref 12.0–46.0)
Lymphs Abs: 0.9 10*3/uL (ref 0.7–4.0)
MCHC: 34 g/dL (ref 30.0–36.0)
MCV: 87.9 fl (ref 78.0–100.0)
MONOS PCT: 10.7 % (ref 3.0–12.0)
Monocytes Absolute: 0.7 10*3/uL (ref 0.1–1.0)
Neutro Abs: 4.9 10*3/uL (ref 1.4–7.7)
Neutrophils Relative %: 71 % (ref 43.0–77.0)
Platelets: 295 10*3/uL (ref 150.0–400.0)
RBC: 3.83 Mil/uL — AB (ref 4.22–5.81)
RDW: 14 % (ref 11.5–15.5)
WBC: 6.9 10*3/uL (ref 4.0–10.5)

## 2016-12-23 LAB — BRAIN NATRIURETIC PEPTIDE: Pro B Natriuretic peptide (BNP): 999 pg/mL — ABNORMAL HIGH (ref 0.0–100.0)

## 2016-12-23 NOTE — Telephone Encounter (Signed)
Dr Trinna Post at [facility] called to advised pt had called their office to express  his concern over his 9 lb weight loss in the past week. Pt discharged from [facility] last week and had hospital fu Monday with Dr Martinique.  Dr Karl Bales states pt had a urine culture that results did not come back until yesterday. Results showed Urine culture infection E coli.  Sensitive to everything except Cipro fluoxitine  They have tried to contact pt, but there is no answer.

## 2016-12-23 NOTE — Telephone Encounter (Signed)
Pt would like to have lab results sent to his address on file.

## 2016-12-23 NOTE — Telephone Encounter (Signed)
Left voicemail for Dan Europe giving verbal for PT.

## 2016-12-23 NOTE — Telephone Encounter (Signed)
Copy placed in the mail for patient.

## 2016-12-23 NOTE — Telephone Encounter (Signed)
FYI

## 2016-12-24 DIAGNOSIS — K661 Hemoperitoneum: Secondary | ICD-10-CM | POA: Diagnosis not present

## 2016-12-24 DIAGNOSIS — N189 Chronic kidney disease, unspecified: Secondary | ICD-10-CM | POA: Diagnosis not present

## 2016-12-24 DIAGNOSIS — Z8546 Personal history of malignant neoplasm of prostate: Secondary | ICD-10-CM | POA: Diagnosis not present

## 2016-12-24 DIAGNOSIS — N1330 Unspecified hydronephrosis: Secondary | ICD-10-CM | POA: Diagnosis not present

## 2016-12-24 DIAGNOSIS — I4892 Unspecified atrial flutter: Secondary | ICD-10-CM | POA: Diagnosis not present

## 2016-12-24 DIAGNOSIS — I129 Hypertensive chronic kidney disease with stage 1 through stage 4 chronic kidney disease, or unspecified chronic kidney disease: Secondary | ICD-10-CM | POA: Diagnosis not present

## 2016-12-24 DIAGNOSIS — N183 Chronic kidney disease, stage 3 (moderate): Secondary | ICD-10-CM | POA: Diagnosis not present

## 2016-12-24 DIAGNOSIS — I484 Atypical atrial flutter: Secondary | ICD-10-CM | POA: Diagnosis not present

## 2016-12-25 DIAGNOSIS — N183 Chronic kidney disease, stage 3 (moderate): Secondary | ICD-10-CM | POA: Diagnosis not present

## 2016-12-25 DIAGNOSIS — I484 Atypical atrial flutter: Secondary | ICD-10-CM | POA: Diagnosis not present

## 2016-12-25 DIAGNOSIS — I129 Hypertensive chronic kidney disease with stage 1 through stage 4 chronic kidney disease, or unspecified chronic kidney disease: Secondary | ICD-10-CM | POA: Diagnosis not present

## 2016-12-25 DIAGNOSIS — Z8546 Personal history of malignant neoplasm of prostate: Secondary | ICD-10-CM | POA: Diagnosis not present

## 2016-12-29 DIAGNOSIS — Z8546 Personal history of malignant neoplasm of prostate: Secondary | ICD-10-CM | POA: Diagnosis not present

## 2016-12-29 DIAGNOSIS — N183 Chronic kidney disease, stage 3 (moderate): Secondary | ICD-10-CM | POA: Diagnosis not present

## 2016-12-29 DIAGNOSIS — I129 Hypertensive chronic kidney disease with stage 1 through stage 4 chronic kidney disease, or unspecified chronic kidney disease: Secondary | ICD-10-CM | POA: Diagnosis not present

## 2016-12-29 DIAGNOSIS — I484 Atypical atrial flutter: Secondary | ICD-10-CM | POA: Diagnosis not present

## 2016-12-30 DIAGNOSIS — C61 Malignant neoplasm of prostate: Secondary | ICD-10-CM | POA: Diagnosis not present

## 2016-12-30 DIAGNOSIS — K219 Gastro-esophageal reflux disease without esophagitis: Secondary | ICD-10-CM | POA: Diagnosis not present

## 2016-12-30 DIAGNOSIS — I252 Old myocardial infarction: Secondary | ICD-10-CM | POA: Diagnosis not present

## 2016-12-30 DIAGNOSIS — I10 Essential (primary) hypertension: Secondary | ICD-10-CM | POA: Diagnosis not present

## 2016-12-30 DIAGNOSIS — I2510 Atherosclerotic heart disease of native coronary artery without angina pectoris: Secondary | ICD-10-CM | POA: Diagnosis not present

## 2016-12-30 DIAGNOSIS — R7303 Prediabetes: Secondary | ICD-10-CM | POA: Diagnosis not present

## 2016-12-30 DIAGNOSIS — N184 Chronic kidney disease, stage 4 (severe): Secondary | ICD-10-CM | POA: Diagnosis not present

## 2016-12-30 DIAGNOSIS — Z1389 Encounter for screening for other disorder: Secondary | ICD-10-CM | POA: Diagnosis not present

## 2017-01-01 DIAGNOSIS — I4892 Unspecified atrial flutter: Secondary | ICD-10-CM | POA: Diagnosis not present

## 2017-01-01 DIAGNOSIS — I480 Paroxysmal atrial fibrillation: Secondary | ICD-10-CM | POA: Diagnosis not present

## 2017-01-01 DIAGNOSIS — I129 Hypertensive chronic kidney disease with stage 1 through stage 4 chronic kidney disease, or unspecified chronic kidney disease: Secondary | ICD-10-CM | POA: Diagnosis not present

## 2017-01-01 DIAGNOSIS — Z9181 History of falling: Secondary | ICD-10-CM | POA: Diagnosis not present

## 2017-01-01 DIAGNOSIS — I38 Endocarditis, valve unspecified: Secondary | ICD-10-CM | POA: Diagnosis not present

## 2017-01-01 DIAGNOSIS — R05 Cough: Secondary | ICD-10-CM | POA: Diagnosis not present

## 2017-01-01 DIAGNOSIS — Z79899 Other long term (current) drug therapy: Secondary | ICD-10-CM | POA: Diagnosis not present

## 2017-01-01 DIAGNOSIS — N184 Chronic kidney disease, stage 4 (severe): Secondary | ICD-10-CM | POA: Diagnosis not present

## 2017-01-02 DIAGNOSIS — I129 Hypertensive chronic kidney disease with stage 1 through stage 4 chronic kidney disease, or unspecified chronic kidney disease: Secondary | ICD-10-CM | POA: Diagnosis not present

## 2017-01-02 DIAGNOSIS — N183 Chronic kidney disease, stage 3 (moderate): Secondary | ICD-10-CM | POA: Diagnosis not present

## 2017-01-02 DIAGNOSIS — I484 Atypical atrial flutter: Secondary | ICD-10-CM | POA: Diagnosis not present

## 2017-01-02 DIAGNOSIS — Z8546 Personal history of malignant neoplasm of prostate: Secondary | ICD-10-CM | POA: Diagnosis not present

## 2017-01-06 DIAGNOSIS — N183 Chronic kidney disease, stage 3 (moderate): Secondary | ICD-10-CM | POA: Diagnosis not present

## 2017-01-06 DIAGNOSIS — I484 Atypical atrial flutter: Secondary | ICD-10-CM | POA: Diagnosis not present

## 2017-01-06 DIAGNOSIS — I129 Hypertensive chronic kidney disease with stage 1 through stage 4 chronic kidney disease, or unspecified chronic kidney disease: Secondary | ICD-10-CM | POA: Diagnosis not present

## 2017-01-06 DIAGNOSIS — Z8546 Personal history of malignant neoplasm of prostate: Secondary | ICD-10-CM | POA: Diagnosis not present

## 2017-01-07 DIAGNOSIS — N189 Chronic kidney disease, unspecified: Secondary | ICD-10-CM | POA: Diagnosis not present

## 2017-01-07 DIAGNOSIS — N1330 Unspecified hydronephrosis: Secondary | ICD-10-CM | POA: Diagnosis not present

## 2017-01-07 DIAGNOSIS — K661 Hemoperitoneum: Secondary | ICD-10-CM | POA: Diagnosis not present

## 2017-01-07 DIAGNOSIS — I4891 Unspecified atrial fibrillation: Secondary | ICD-10-CM | POA: Diagnosis not present

## 2017-01-09 DIAGNOSIS — I484 Atypical atrial flutter: Secondary | ICD-10-CM | POA: Diagnosis not present

## 2017-01-09 DIAGNOSIS — Z8546 Personal history of malignant neoplasm of prostate: Secondary | ICD-10-CM | POA: Diagnosis not present

## 2017-01-09 DIAGNOSIS — N183 Chronic kidney disease, stage 3 (moderate): Secondary | ICD-10-CM | POA: Diagnosis not present

## 2017-01-09 DIAGNOSIS — I129 Hypertensive chronic kidney disease with stage 1 through stage 4 chronic kidney disease, or unspecified chronic kidney disease: Secondary | ICD-10-CM | POA: Diagnosis not present

## 2017-01-13 DIAGNOSIS — I129 Hypertensive chronic kidney disease with stage 1 through stage 4 chronic kidney disease, or unspecified chronic kidney disease: Secondary | ICD-10-CM | POA: Diagnosis not present

## 2017-01-13 DIAGNOSIS — N183 Chronic kidney disease, stage 3 (moderate): Secondary | ICD-10-CM | POA: Diagnosis not present

## 2017-01-13 DIAGNOSIS — Z8546 Personal history of malignant neoplasm of prostate: Secondary | ICD-10-CM | POA: Diagnosis not present

## 2017-01-13 DIAGNOSIS — I484 Atypical atrial flutter: Secondary | ICD-10-CM | POA: Diagnosis not present

## 2017-01-14 DIAGNOSIS — I484 Atypical atrial flutter: Secondary | ICD-10-CM | POA: Diagnosis not present

## 2017-01-14 DIAGNOSIS — N183 Chronic kidney disease, stage 3 (moderate): Secondary | ICD-10-CM | POA: Diagnosis not present

## 2017-01-14 DIAGNOSIS — I129 Hypertensive chronic kidney disease with stage 1 through stage 4 chronic kidney disease, or unspecified chronic kidney disease: Secondary | ICD-10-CM | POA: Diagnosis not present

## 2017-01-14 DIAGNOSIS — Z8546 Personal history of malignant neoplasm of prostate: Secondary | ICD-10-CM | POA: Diagnosis not present

## 2017-01-16 ENCOUNTER — Telehealth: Payer: Self-pay

## 2017-01-16 NOTE — Telephone Encounter (Signed)
Spoke with Pramod and advised. Nothing further needed at this time.

## 2017-01-16 NOTE — Telephone Encounter (Signed)
Pramod @ [facility] is calling to request an order for PT for I visit/1 week as pt did not want to be seen by PT today so they need to come back next week.  Dr. Martinique - Please advise. Thanks!

## 2017-01-16 NOTE — Telephone Encounter (Signed)
It is Ok to arrange for PT as requested. Thanks, BJ

## 2017-01-19 ENCOUNTER — Telehealth: Payer: Self-pay | Admitting: Family Medicine

## 2017-01-19 ENCOUNTER — Ambulatory Visit: Payer: Medicare Other | Admitting: Family Medicine

## 2017-01-19 DIAGNOSIS — Z8546 Personal history of malignant neoplasm of prostate: Secondary | ICD-10-CM | POA: Diagnosis not present

## 2017-01-19 DIAGNOSIS — I129 Hypertensive chronic kidney disease with stage 1 through stage 4 chronic kidney disease, or unspecified chronic kidney disease: Secondary | ICD-10-CM | POA: Diagnosis not present

## 2017-01-19 DIAGNOSIS — I484 Atypical atrial flutter: Secondary | ICD-10-CM | POA: Diagnosis not present

## 2017-01-19 DIAGNOSIS — N183 Chronic kidney disease, stage 3 (moderate): Secondary | ICD-10-CM | POA: Diagnosis not present

## 2017-01-19 NOTE — Telephone Encounter (Signed)
Verbal given 

## 2017-01-19 NOTE — Telephone Encounter (Signed)
Verbal okay? 

## 2017-01-19 NOTE — Telephone Encounter (Signed)
° °  Pramod with [facility] call to ask for verbal orders for PT 2 TIMES A WEEK FOR 4 WEEKS     [phone]

## 2017-01-19 NOTE — Telephone Encounter (Signed)
It is ok to authorize request for PT.  Thanks, BJ

## 2017-01-26 DIAGNOSIS — I484 Atypical atrial flutter: Secondary | ICD-10-CM | POA: Diagnosis not present

## 2017-01-26 DIAGNOSIS — I129 Hypertensive chronic kidney disease with stage 1 through stage 4 chronic kidney disease, or unspecified chronic kidney disease: Secondary | ICD-10-CM | POA: Diagnosis not present

## 2017-01-26 DIAGNOSIS — N183 Chronic kidney disease, stage 3 (moderate): Secondary | ICD-10-CM | POA: Diagnosis not present

## 2017-01-26 DIAGNOSIS — Z8546 Personal history of malignant neoplasm of prostate: Secondary | ICD-10-CM | POA: Diagnosis not present

## 2017-01-29 ENCOUNTER — Telehealth: Payer: Self-pay | Admitting: Family Medicine

## 2017-01-29 DIAGNOSIS — N183 Chronic kidney disease, stage 3 (moderate): Secondary | ICD-10-CM | POA: Diagnosis not present

## 2017-01-29 DIAGNOSIS — I484 Atypical atrial flutter: Secondary | ICD-10-CM | POA: Diagnosis not present

## 2017-01-29 DIAGNOSIS — I129 Hypertensive chronic kidney disease with stage 1 through stage 4 chronic kidney disease, or unspecified chronic kidney disease: Secondary | ICD-10-CM | POA: Diagnosis not present

## 2017-01-29 DIAGNOSIS — Z8546 Personal history of malignant neoplasm of prostate: Secondary | ICD-10-CM | POA: Diagnosis not present

## 2017-01-29 NOTE — Telephone Encounter (Signed)
Physical therapist, Tillie Rung, is calling Johnny Morrow to get a PRN nursing visit.  Patient is having more fatigue that usual.  O2 was 97% now at 94%.  After exercise it goes to 90% which is a change from earlier in the week.  Mild edema in legs and patient has not been taking the lasix as it's PRN.  Wants to be able to utilize PRN visits to monitor patient.

## 2017-01-30 NOTE — Telephone Encounter (Signed)
Verbal okay? 

## 2017-01-30 NOTE — Telephone Encounter (Signed)
It is Ok to give verbal authorization.  Thanks, Johnny Morrow

## 2017-01-30 NOTE — Telephone Encounter (Signed)
Left verbal on Johnny Morrow's voicemail.

## 2017-02-03 DIAGNOSIS — N183 Chronic kidney disease, stage 3 (moderate): Secondary | ICD-10-CM | POA: Diagnosis not present

## 2017-02-03 DIAGNOSIS — I129 Hypertensive chronic kidney disease with stage 1 through stage 4 chronic kidney disease, or unspecified chronic kidney disease: Secondary | ICD-10-CM | POA: Diagnosis not present

## 2017-02-03 DIAGNOSIS — Z8546 Personal history of malignant neoplasm of prostate: Secondary | ICD-10-CM | POA: Diagnosis not present

## 2017-02-03 DIAGNOSIS — I484 Atypical atrial flutter: Secondary | ICD-10-CM | POA: Diagnosis not present

## 2017-02-04 DIAGNOSIS — N1339 Other hydronephrosis: Secondary | ICD-10-CM | POA: Diagnosis not present

## 2017-02-04 DIAGNOSIS — N184 Chronic kidney disease, stage 4 (severe): Secondary | ICD-10-CM | POA: Diagnosis not present

## 2017-02-05 DIAGNOSIS — N183 Chronic kidney disease, stage 3 (moderate): Secondary | ICD-10-CM | POA: Diagnosis not present

## 2017-02-05 DIAGNOSIS — I129 Hypertensive chronic kidney disease with stage 1 through stage 4 chronic kidney disease, or unspecified chronic kidney disease: Secondary | ICD-10-CM | POA: Diagnosis not present

## 2017-02-05 DIAGNOSIS — I484 Atypical atrial flutter: Secondary | ICD-10-CM | POA: Diagnosis not present

## 2017-02-05 DIAGNOSIS — Z8546 Personal history of malignant neoplasm of prostate: Secondary | ICD-10-CM | POA: Diagnosis not present

## 2017-02-12 DIAGNOSIS — I350 Nonrheumatic aortic (valve) stenosis: Secondary | ICD-10-CM | POA: Diagnosis not present

## 2017-02-12 DIAGNOSIS — H40012 Open angle with borderline findings, low risk, left eye: Secondary | ICD-10-CM | POA: Diagnosis not present

## 2017-02-12 DIAGNOSIS — N1330 Unspecified hydronephrosis: Secondary | ICD-10-CM | POA: Diagnosis not present

## 2017-02-12 DIAGNOSIS — H2513 Age-related nuclear cataract, bilateral: Secondary | ICD-10-CM | POA: Diagnosis not present

## 2017-02-12 DIAGNOSIS — I10 Essential (primary) hypertension: Secondary | ICD-10-CM | POA: Diagnosis not present

## 2017-02-12 DIAGNOSIS — H40011 Open angle with borderline findings, low risk, right eye: Secondary | ICD-10-CM | POA: Diagnosis not present

## 2017-02-12 DIAGNOSIS — R0609 Other forms of dyspnea: Secondary | ICD-10-CM | POA: Diagnosis not present

## 2017-02-12 DIAGNOSIS — I4892 Unspecified atrial flutter: Secondary | ICD-10-CM | POA: Diagnosis not present

## 2017-02-13 DIAGNOSIS — I484 Atypical atrial flutter: Secondary | ICD-10-CM | POA: Diagnosis not present

## 2017-02-13 DIAGNOSIS — I10 Essential (primary) hypertension: Secondary | ICD-10-CM | POA: Diagnosis not present

## 2017-02-13 DIAGNOSIS — N184 Chronic kidney disease, stage 4 (severe): Secondary | ICD-10-CM | POA: Diagnosis not present

## 2017-02-13 DIAGNOSIS — N183 Chronic kidney disease, stage 3 (moderate): Secondary | ICD-10-CM | POA: Diagnosis not present

## 2017-02-13 DIAGNOSIS — D649 Anemia, unspecified: Secondary | ICD-10-CM | POA: Diagnosis not present

## 2017-02-13 DIAGNOSIS — K219 Gastro-esophageal reflux disease without esophagitis: Secondary | ICD-10-CM | POA: Diagnosis not present

## 2017-02-13 DIAGNOSIS — Z8546 Personal history of malignant neoplasm of prostate: Secondary | ICD-10-CM | POA: Diagnosis not present

## 2017-02-13 DIAGNOSIS — I129 Hypertensive chronic kidney disease with stage 1 through stage 4 chronic kidney disease, or unspecified chronic kidney disease: Secondary | ICD-10-CM | POA: Diagnosis not present

## 2017-02-13 DIAGNOSIS — I252 Old myocardial infarction: Secondary | ICD-10-CM | POA: Diagnosis not present

## 2017-02-13 DIAGNOSIS — C61 Malignant neoplasm of prostate: Secondary | ICD-10-CM | POA: Diagnosis not present

## 2017-02-13 DIAGNOSIS — I2510 Atherosclerotic heart disease of native coronary artery without angina pectoris: Secondary | ICD-10-CM | POA: Diagnosis not present

## 2017-02-16 DIAGNOSIS — Z8546 Personal history of malignant neoplasm of prostate: Secondary | ICD-10-CM | POA: Diagnosis not present

## 2017-02-16 DIAGNOSIS — I129 Hypertensive chronic kidney disease with stage 1 through stage 4 chronic kidney disease, or unspecified chronic kidney disease: Secondary | ICD-10-CM | POA: Diagnosis not present

## 2017-02-16 DIAGNOSIS — N183 Chronic kidney disease, stage 3 (moderate): Secondary | ICD-10-CM | POA: Diagnosis not present

## 2017-02-16 DIAGNOSIS — I484 Atypical atrial flutter: Secondary | ICD-10-CM | POA: Diagnosis not present

## 2017-02-17 DIAGNOSIS — I129 Hypertensive chronic kidney disease with stage 1 through stage 4 chronic kidney disease, or unspecified chronic kidney disease: Secondary | ICD-10-CM | POA: Diagnosis not present

## 2017-02-17 DIAGNOSIS — I484 Atypical atrial flutter: Secondary | ICD-10-CM | POA: Diagnosis not present

## 2017-02-17 DIAGNOSIS — Z8546 Personal history of malignant neoplasm of prostate: Secondary | ICD-10-CM | POA: Diagnosis not present

## 2017-02-17 DIAGNOSIS — N183 Chronic kidney disease, stage 3 (moderate): Secondary | ICD-10-CM | POA: Diagnosis not present

## 2017-02-19 DIAGNOSIS — N1339 Other hydronephrosis: Secondary | ICD-10-CM | POA: Diagnosis not present

## 2017-02-19 DIAGNOSIS — N200 Calculus of kidney: Secondary | ICD-10-CM | POA: Diagnosis not present

## 2017-02-19 DIAGNOSIS — N281 Cyst of kidney, acquired: Secondary | ICD-10-CM | POA: Diagnosis not present

## 2017-02-19 DIAGNOSIS — N184 Chronic kidney disease, stage 4 (severe): Secondary | ICD-10-CM | POA: Diagnosis not present

## 2017-02-21 DIAGNOSIS — R531 Weakness: Secondary | ICD-10-CM | POA: Diagnosis not present

## 2017-02-21 DIAGNOSIS — I2510 Atherosclerotic heart disease of native coronary artery without angina pectoris: Secondary | ICD-10-CM | POA: Diagnosis not present

## 2017-02-22 DIAGNOSIS — I2510 Atherosclerotic heart disease of native coronary artery without angina pectoris: Secondary | ICD-10-CM | POA: Diagnosis not present

## 2017-02-22 DIAGNOSIS — R531 Weakness: Secondary | ICD-10-CM | POA: Diagnosis not present

## 2017-02-23 DIAGNOSIS — I2510 Atherosclerotic heart disease of native coronary artery without angina pectoris: Secondary | ICD-10-CM | POA: Diagnosis not present

## 2017-02-23 DIAGNOSIS — R531 Weakness: Secondary | ICD-10-CM | POA: Diagnosis not present

## 2017-02-24 DIAGNOSIS — R531 Weakness: Secondary | ICD-10-CM | POA: Diagnosis not present

## 2017-02-24 DIAGNOSIS — I2510 Atherosclerotic heart disease of native coronary artery without angina pectoris: Secondary | ICD-10-CM | POA: Diagnosis not present

## 2017-02-26 DIAGNOSIS — D696 Thrombocytopenia, unspecified: Secondary | ICD-10-CM | POA: Diagnosis not present

## 2017-02-26 DIAGNOSIS — D649 Anemia, unspecified: Secondary | ICD-10-CM | POA: Diagnosis not present

## 2017-02-27 DIAGNOSIS — I2510 Atherosclerotic heart disease of native coronary artery without angina pectoris: Secondary | ICD-10-CM | POA: Diagnosis not present

## 2017-02-27 DIAGNOSIS — R531 Weakness: Secondary | ICD-10-CM | POA: Diagnosis not present

## 2017-03-03 DIAGNOSIS — I2510 Atherosclerotic heart disease of native coronary artery without angina pectoris: Secondary | ICD-10-CM | POA: Diagnosis not present

## 2017-03-03 DIAGNOSIS — R531 Weakness: Secondary | ICD-10-CM | POA: Diagnosis not present

## 2017-03-05 DIAGNOSIS — R531 Weakness: Secondary | ICD-10-CM | POA: Diagnosis not present

## 2017-03-05 DIAGNOSIS — I2510 Atherosclerotic heart disease of native coronary artery without angina pectoris: Secondary | ICD-10-CM | POA: Diagnosis not present

## 2017-03-06 DIAGNOSIS — I2510 Atherosclerotic heart disease of native coronary artery without angina pectoris: Secondary | ICD-10-CM | POA: Diagnosis not present

## 2017-03-06 DIAGNOSIS — R531 Weakness: Secondary | ICD-10-CM | POA: Diagnosis not present

## 2017-03-09 ENCOUNTER — Other Ambulatory Visit: Payer: Self-pay

## 2017-03-09 DIAGNOSIS — I2510 Atherosclerotic heart disease of native coronary artery without angina pectoris: Secondary | ICD-10-CM | POA: Diagnosis not present

## 2017-03-09 DIAGNOSIS — R531 Weakness: Secondary | ICD-10-CM | POA: Diagnosis not present

## 2017-03-09 MED ORDER — AMLODIPINE BESYLATE 10 MG PO TABS: 10.0000 mg | ORAL_TABLET | Freq: Every day | ORAL | 1 refills | Status: DC

## 2017-03-10 DIAGNOSIS — R531 Weakness: Secondary | ICD-10-CM | POA: Diagnosis not present

## 2017-03-10 DIAGNOSIS — I2510 Atherosclerotic heart disease of native coronary artery without angina pectoris: Secondary | ICD-10-CM | POA: Diagnosis not present

## 2017-03-11 DIAGNOSIS — C61 Malignant neoplasm of prostate: Secondary | ICD-10-CM | POA: Diagnosis not present

## 2017-03-12 DIAGNOSIS — I2510 Atherosclerotic heart disease of native coronary artery without angina pectoris: Secondary | ICD-10-CM | POA: Diagnosis not present

## 2017-03-12 DIAGNOSIS — R531 Weakness: Secondary | ICD-10-CM | POA: Diagnosis not present

## 2017-03-13 DIAGNOSIS — R531 Weakness: Secondary | ICD-10-CM | POA: Diagnosis not present

## 2017-03-13 DIAGNOSIS — I2510 Atherosclerotic heart disease of native coronary artery without angina pectoris: Secondary | ICD-10-CM | POA: Diagnosis not present

## 2017-03-16 DIAGNOSIS — H25813 Combined forms of age-related cataract, bilateral: Secondary | ICD-10-CM | POA: Diagnosis not present

## 2017-03-16 DIAGNOSIS — H35033 Hypertensive retinopathy, bilateral: Secondary | ICD-10-CM | POA: Diagnosis not present

## 2017-03-16 DIAGNOSIS — H401122 Primary open-angle glaucoma, left eye, moderate stage: Secondary | ICD-10-CM | POA: Diagnosis not present

## 2017-03-16 DIAGNOSIS — H401111 Primary open-angle glaucoma, right eye, mild stage: Secondary | ICD-10-CM | POA: Diagnosis not present

## 2017-03-17 DIAGNOSIS — I10 Essential (primary) hypertension: Secondary | ICD-10-CM | POA: Diagnosis not present

## 2017-03-17 DIAGNOSIS — I2510 Atherosclerotic heart disease of native coronary artery without angina pectoris: Secondary | ICD-10-CM | POA: Diagnosis not present

## 2017-03-17 DIAGNOSIS — D696 Thrombocytopenia, unspecified: Secondary | ICD-10-CM | POA: Diagnosis not present

## 2017-03-17 DIAGNOSIS — D649 Anemia, unspecified: Secondary | ICD-10-CM | POA: Diagnosis not present

## 2017-03-17 DIAGNOSIS — R531 Weakness: Secondary | ICD-10-CM | POA: Diagnosis not present

## 2017-03-18 DIAGNOSIS — D61818 Other pancytopenia: Secondary | ICD-10-CM | POA: Diagnosis not present

## 2017-03-18 DIAGNOSIS — N184 Chronic kidney disease, stage 4 (severe): Secondary | ICD-10-CM | POA: Diagnosis not present

## 2017-03-19 DIAGNOSIS — R531 Weakness: Secondary | ICD-10-CM | POA: Diagnosis not present

## 2017-03-19 DIAGNOSIS — I2510 Atherosclerotic heart disease of native coronary artery without angina pectoris: Secondary | ICD-10-CM | POA: Diagnosis not present

## 2017-03-23 DIAGNOSIS — J811 Chronic pulmonary edema: Secondary | ICD-10-CM | POA: Diagnosis not present

## 2017-03-23 DIAGNOSIS — I272 Pulmonary hypertension, unspecified: Secondary | ICD-10-CM | POA: Diagnosis not present

## 2017-03-23 DIAGNOSIS — R0602 Shortness of breath: Secondary | ICD-10-CM | POA: Diagnosis not present

## 2017-03-23 DIAGNOSIS — I4892 Unspecified atrial flutter: Secondary | ICD-10-CM | POA: Diagnosis not present

## 2017-03-23 DIAGNOSIS — I82432 Acute embolism and thrombosis of left popliteal vein: Secondary | ICD-10-CM | POA: Diagnosis not present

## 2017-03-23 DIAGNOSIS — Z87891 Personal history of nicotine dependence: Secondary | ICD-10-CM | POA: Diagnosis not present

## 2017-03-23 DIAGNOSIS — I132 Hypertensive heart and chronic kidney disease with heart failure and with stage 5 chronic kidney disease, or end stage renal disease: Secondary | ICD-10-CM | POA: Diagnosis not present

## 2017-03-23 DIAGNOSIS — R0600 Dyspnea, unspecified: Secondary | ICD-10-CM | POA: Diagnosis not present

## 2017-03-23 DIAGNOSIS — N185 Chronic kidney disease, stage 5: Secondary | ICD-10-CM | POA: Diagnosis not present

## 2017-03-23 DIAGNOSIS — I5033 Acute on chronic diastolic (congestive) heart failure: Secondary | ICD-10-CM | POA: Diagnosis not present

## 2017-03-24 DIAGNOSIS — I132 Hypertensive heart and chronic kidney disease with heart failure and with stage 5 chronic kidney disease, or end stage renal disease: Secondary | ICD-10-CM | POA: Diagnosis present

## 2017-03-24 DIAGNOSIS — I340 Nonrheumatic mitral (valve) insufficiency: Secondary | ICD-10-CM | POA: Diagnosis not present

## 2017-03-24 DIAGNOSIS — R0602 Shortness of breath: Secondary | ICD-10-CM | POA: Diagnosis not present

## 2017-03-24 DIAGNOSIS — I371 Nonrheumatic pulmonary valve insufficiency: Secondary | ICD-10-CM | POA: Diagnosis not present

## 2017-03-24 DIAGNOSIS — I700 Atherosclerosis of aorta: Secondary | ICD-10-CM | POA: Diagnosis present

## 2017-03-24 DIAGNOSIS — I4892 Unspecified atrial flutter: Secondary | ICD-10-CM | POA: Diagnosis present

## 2017-03-24 DIAGNOSIS — Z9079 Acquired absence of other genital organ(s): Secondary | ICD-10-CM | POA: Diagnosis not present

## 2017-03-24 DIAGNOSIS — I350 Nonrheumatic aortic (valve) stenosis: Secondary | ICD-10-CM | POA: Diagnosis present

## 2017-03-24 DIAGNOSIS — I361 Nonrheumatic tricuspid (valve) insufficiency: Secondary | ICD-10-CM | POA: Diagnosis not present

## 2017-03-24 DIAGNOSIS — I10 Essential (primary) hypertension: Secondary | ICD-10-CM | POA: Diagnosis not present

## 2017-03-24 DIAGNOSIS — Z87891 Personal history of nicotine dependence: Secondary | ICD-10-CM | POA: Diagnosis not present

## 2017-03-24 DIAGNOSIS — I82432 Acute embolism and thrombosis of left popliteal vein: Secondary | ICD-10-CM | POA: Diagnosis present

## 2017-03-24 DIAGNOSIS — Z8546 Personal history of malignant neoplasm of prostate: Secondary | ICD-10-CM | POA: Diagnosis not present

## 2017-03-24 DIAGNOSIS — I160 Hypertensive urgency: Secondary | ICD-10-CM | POA: Diagnosis present

## 2017-03-24 DIAGNOSIS — I5033 Acute on chronic diastolic (congestive) heart failure: Secondary | ICD-10-CM | POA: Diagnosis not present

## 2017-03-24 DIAGNOSIS — I480 Paroxysmal atrial fibrillation: Secondary | ICD-10-CM | POA: Diagnosis not present

## 2017-03-24 DIAGNOSIS — I351 Nonrheumatic aortic (valve) insufficiency: Secondary | ICD-10-CM | POA: Diagnosis not present

## 2017-03-24 DIAGNOSIS — I517 Cardiomegaly: Secondary | ICD-10-CM | POA: Diagnosis not present

## 2017-03-24 DIAGNOSIS — N185 Chronic kidney disease, stage 5: Secondary | ICD-10-CM | POA: Diagnosis present

## 2017-03-24 DIAGNOSIS — I272 Pulmonary hypertension, unspecified: Secondary | ICD-10-CM | POA: Diagnosis present

## 2017-03-24 DIAGNOSIS — Z86718 Personal history of other venous thrombosis and embolism: Secondary | ICD-10-CM | POA: Diagnosis not present

## 2017-03-30 DIAGNOSIS — R531 Weakness: Secondary | ICD-10-CM | POA: Diagnosis not present

## 2017-03-30 DIAGNOSIS — I2510 Atherosclerotic heart disease of native coronary artery without angina pectoris: Secondary | ICD-10-CM | POA: Diagnosis not present

## 2017-03-31 DIAGNOSIS — I2510 Atherosclerotic heart disease of native coronary artery without angina pectoris: Secondary | ICD-10-CM | POA: Diagnosis not present

## 2017-03-31 DIAGNOSIS — R531 Weakness: Secondary | ICD-10-CM | POA: Diagnosis not present

## 2017-04-01 DIAGNOSIS — H401122 Primary open-angle glaucoma, left eye, moderate stage: Secondary | ICD-10-CM | POA: Diagnosis not present

## 2017-04-01 DIAGNOSIS — H401111 Primary open-angle glaucoma, right eye, mild stage: Secondary | ICD-10-CM | POA: Diagnosis not present

## 2017-04-02 DIAGNOSIS — R531 Weakness: Secondary | ICD-10-CM | POA: Diagnosis not present

## 2017-04-02 DIAGNOSIS — I2510 Atherosclerotic heart disease of native coronary artery without angina pectoris: Secondary | ICD-10-CM | POA: Diagnosis not present

## 2017-04-03 DIAGNOSIS — I5030 Unspecified diastolic (congestive) heart failure: Secondary | ICD-10-CM | POA: Diagnosis not present

## 2017-04-03 DIAGNOSIS — I10 Essential (primary) hypertension: Secondary | ICD-10-CM | POA: Diagnosis not present

## 2017-04-03 DIAGNOSIS — N184 Chronic kidney disease, stage 4 (severe): Secondary | ICD-10-CM | POA: Diagnosis not present

## 2017-04-06 DIAGNOSIS — I2510 Atherosclerotic heart disease of native coronary artery without angina pectoris: Secondary | ICD-10-CM | POA: Diagnosis not present

## 2017-04-06 DIAGNOSIS — R531 Weakness: Secondary | ICD-10-CM | POA: Diagnosis not present

## 2017-04-07 DIAGNOSIS — D649 Anemia, unspecified: Secondary | ICD-10-CM | POA: Diagnosis not present

## 2017-04-07 DIAGNOSIS — D696 Thrombocytopenia, unspecified: Secondary | ICD-10-CM | POA: Diagnosis not present

## 2017-04-09 DIAGNOSIS — I2510 Atherosclerotic heart disease of native coronary artery without angina pectoris: Secondary | ICD-10-CM | POA: Diagnosis not present

## 2017-04-09 DIAGNOSIS — R531 Weakness: Secondary | ICD-10-CM | POA: Diagnosis not present

## 2017-04-13 DIAGNOSIS — I2510 Atherosclerotic heart disease of native coronary artery without angina pectoris: Secondary | ICD-10-CM | POA: Diagnosis not present

## 2017-04-13 DIAGNOSIS — R531 Weakness: Secondary | ICD-10-CM | POA: Diagnosis not present

## 2017-04-14 DIAGNOSIS — I5033 Acute on chronic diastolic (congestive) heart failure: Secondary | ICD-10-CM | POA: Diagnosis not present

## 2017-04-14 DIAGNOSIS — I10 Essential (primary) hypertension: Secondary | ICD-10-CM | POA: Diagnosis not present

## 2017-04-16 DIAGNOSIS — R531 Weakness: Secondary | ICD-10-CM | POA: Diagnosis not present

## 2017-04-16 DIAGNOSIS — I2510 Atherosclerotic heart disease of native coronary artery without angina pectoris: Secondary | ICD-10-CM | POA: Diagnosis not present

## 2017-04-21 DIAGNOSIS — N400 Enlarged prostate without lower urinary tract symptoms: Secondary | ICD-10-CM | POA: Diagnosis not present

## 2017-04-21 DIAGNOSIS — I10 Essential (primary) hypertension: Secondary | ICD-10-CM | POA: Diagnosis not present

## 2017-04-21 DIAGNOSIS — D649 Anemia, unspecified: Secondary | ICD-10-CM | POA: Diagnosis not present

## 2017-04-21 DIAGNOSIS — I5030 Unspecified diastolic (congestive) heart failure: Secondary | ICD-10-CM | POA: Diagnosis not present

## 2017-04-21 DIAGNOSIS — R531 Weakness: Secondary | ICD-10-CM | POA: Diagnosis not present

## 2017-04-21 DIAGNOSIS — I252 Old myocardial infarction: Secondary | ICD-10-CM | POA: Diagnosis not present

## 2017-04-21 DIAGNOSIS — N184 Chronic kidney disease, stage 4 (severe): Secondary | ICD-10-CM | POA: Diagnosis not present

## 2017-04-21 DIAGNOSIS — I2510 Atherosclerotic heart disease of native coronary artery without angina pectoris: Secondary | ICD-10-CM | POA: Diagnosis not present

## 2017-04-21 DIAGNOSIS — C61 Malignant neoplasm of prostate: Secondary | ICD-10-CM | POA: Diagnosis not present

## 2017-04-22 DIAGNOSIS — Z87891 Personal history of nicotine dependence: Secondary | ICD-10-CM | POA: Diagnosis not present

## 2017-04-22 DIAGNOSIS — I2510 Atherosclerotic heart disease of native coronary artery without angina pectoris: Secondary | ICD-10-CM | POA: Diagnosis not present

## 2017-04-22 DIAGNOSIS — I5032 Chronic diastolic (congestive) heart failure: Secondary | ICD-10-CM | POA: Diagnosis not present

## 2017-04-22 DIAGNOSIS — N184 Chronic kidney disease, stage 4 (severe): Secondary | ICD-10-CM | POA: Diagnosis not present

## 2017-04-22 DIAGNOSIS — Z8546 Personal history of malignant neoplasm of prostate: Secondary | ICD-10-CM | POA: Diagnosis not present

## 2017-04-22 DIAGNOSIS — N400 Enlarged prostate without lower urinary tract symptoms: Secondary | ICD-10-CM | POA: Diagnosis not present

## 2017-04-22 DIAGNOSIS — E785 Hyperlipidemia, unspecified: Secondary | ICD-10-CM | POA: Diagnosis not present

## 2017-04-22 DIAGNOSIS — I252 Old myocardial infarction: Secondary | ICD-10-CM | POA: Diagnosis not present

## 2017-04-22 DIAGNOSIS — I4892 Unspecified atrial flutter: Secondary | ICD-10-CM | POA: Diagnosis not present

## 2017-04-22 DIAGNOSIS — R911 Solitary pulmonary nodule: Secondary | ICD-10-CM | POA: Diagnosis not present

## 2017-04-22 DIAGNOSIS — I130 Hypertensive heart and chronic kidney disease with heart failure and stage 1 through stage 4 chronic kidney disease, or unspecified chronic kidney disease: Secondary | ICD-10-CM | POA: Diagnosis not present

## 2017-04-22 DIAGNOSIS — D631 Anemia in chronic kidney disease: Secondary | ICD-10-CM | POA: Diagnosis not present

## 2017-04-23 DIAGNOSIS — N184 Chronic kidney disease, stage 4 (severe): Secondary | ICD-10-CM | POA: Diagnosis not present

## 2017-04-23 DIAGNOSIS — I5030 Unspecified diastolic (congestive) heart failure: Secondary | ICD-10-CM | POA: Diagnosis not present

## 2017-04-29 DIAGNOSIS — H401122 Primary open-angle glaucoma, left eye, moderate stage: Secondary | ICD-10-CM | POA: Diagnosis not present

## 2017-04-29 DIAGNOSIS — H401111 Primary open-angle glaucoma, right eye, mild stage: Secondary | ICD-10-CM | POA: Diagnosis not present

## 2017-04-30 DIAGNOSIS — I130 Hypertensive heart and chronic kidney disease with heart failure and stage 1 through stage 4 chronic kidney disease, or unspecified chronic kidney disease: Secondary | ICD-10-CM | POA: Diagnosis not present

## 2017-04-30 DIAGNOSIS — I5032 Chronic diastolic (congestive) heart failure: Secondary | ICD-10-CM | POA: Diagnosis not present

## 2017-05-05 DIAGNOSIS — I5032 Chronic diastolic (congestive) heart failure: Secondary | ICD-10-CM | POA: Diagnosis not present

## 2017-05-05 DIAGNOSIS — I130 Hypertensive heart and chronic kidney disease with heart failure and stage 1 through stage 4 chronic kidney disease, or unspecified chronic kidney disease: Secondary | ICD-10-CM | POA: Diagnosis not present

## 2017-05-07 ENCOUNTER — Encounter: Payer: Self-pay | Admitting: Family Medicine

## 2017-05-07 DIAGNOSIS — I130 Hypertensive heart and chronic kidney disease with heart failure and stage 1 through stage 4 chronic kidney disease, or unspecified chronic kidney disease: Secondary | ICD-10-CM | POA: Diagnosis not present

## 2017-05-07 DIAGNOSIS — I5032 Chronic diastolic (congestive) heart failure: Secondary | ICD-10-CM | POA: Diagnosis not present

## 2017-05-11 DIAGNOSIS — I130 Hypertensive heart and chronic kidney disease with heart failure and stage 1 through stage 4 chronic kidney disease, or unspecified chronic kidney disease: Secondary | ICD-10-CM | POA: Diagnosis not present

## 2017-05-11 DIAGNOSIS — I5032 Chronic diastolic (congestive) heart failure: Secondary | ICD-10-CM | POA: Diagnosis not present

## 2017-05-14 DIAGNOSIS — D649 Anemia, unspecified: Secondary | ICD-10-CM | POA: Diagnosis not present

## 2017-05-14 DIAGNOSIS — I10 Essential (primary) hypertension: Secondary | ICD-10-CM | POA: Diagnosis not present

## 2017-05-14 DIAGNOSIS — I4892 Unspecified atrial flutter: Secondary | ICD-10-CM | POA: Diagnosis not present

## 2017-05-14 DIAGNOSIS — R601 Generalized edema: Secondary | ICD-10-CM | POA: Diagnosis not present

## 2017-05-14 DIAGNOSIS — N184 Chronic kidney disease, stage 4 (severe): Secondary | ICD-10-CM | POA: Diagnosis not present

## 2017-05-15 DIAGNOSIS — I130 Hypertensive heart and chronic kidney disease with heart failure and stage 1 through stage 4 chronic kidney disease, or unspecified chronic kidney disease: Secondary | ICD-10-CM | POA: Diagnosis not present

## 2017-05-15 DIAGNOSIS — I5032 Chronic diastolic (congestive) heart failure: Secondary | ICD-10-CM | POA: Diagnosis not present

## 2017-05-19 DIAGNOSIS — I130 Hypertensive heart and chronic kidney disease with heart failure and stage 1 through stage 4 chronic kidney disease, or unspecified chronic kidney disease: Secondary | ICD-10-CM | POA: Diagnosis not present

## 2017-05-19 DIAGNOSIS — I5032 Chronic diastolic (congestive) heart failure: Secondary | ICD-10-CM | POA: Diagnosis not present

## 2017-05-21 DIAGNOSIS — I130 Hypertensive heart and chronic kidney disease with heart failure and stage 1 through stage 4 chronic kidney disease, or unspecified chronic kidney disease: Secondary | ICD-10-CM | POA: Diagnosis not present

## 2017-05-21 DIAGNOSIS — I5032 Chronic diastolic (congestive) heart failure: Secondary | ICD-10-CM | POA: Diagnosis not present

## 2017-05-26 DIAGNOSIS — R7303 Prediabetes: Secondary | ICD-10-CM | POA: Diagnosis not present

## 2017-05-26 DIAGNOSIS — Z8546 Personal history of malignant neoplasm of prostate: Secondary | ICD-10-CM | POA: Diagnosis not present

## 2017-05-26 DIAGNOSIS — N400 Enlarged prostate without lower urinary tract symptoms: Secondary | ICD-10-CM | POA: Diagnosis not present

## 2017-05-26 DIAGNOSIS — N184 Chronic kidney disease, stage 4 (severe): Secondary | ICD-10-CM | POA: Diagnosis not present

## 2017-05-26 DIAGNOSIS — D649 Anemia, unspecified: Secondary | ICD-10-CM | POA: Diagnosis not present

## 2017-05-26 DIAGNOSIS — Z23 Encounter for immunization: Secondary | ICD-10-CM | POA: Diagnosis not present

## 2017-05-26 DIAGNOSIS — I5030 Unspecified diastolic (congestive) heart failure: Secondary | ICD-10-CM | POA: Diagnosis not present

## 2017-05-26 DIAGNOSIS — I10 Essential (primary) hypertension: Secondary | ICD-10-CM | POA: Diagnosis not present

## 2017-05-26 DIAGNOSIS — K219 Gastro-esophageal reflux disease without esophagitis: Secondary | ICD-10-CM | POA: Diagnosis not present

## 2017-06-01 DIAGNOSIS — H401111 Primary open-angle glaucoma, right eye, mild stage: Secondary | ICD-10-CM | POA: Diagnosis not present

## 2017-06-01 DIAGNOSIS — H401122 Primary open-angle glaucoma, left eye, moderate stage: Secondary | ICD-10-CM | POA: Diagnosis not present

## 2017-06-01 DIAGNOSIS — H25813 Combined forms of age-related cataract, bilateral: Secondary | ICD-10-CM | POA: Diagnosis not present

## 2017-06-07 ENCOUNTER — Encounter (HOSPITAL_COMMUNITY): Payer: Self-pay | Admitting: Emergency Medicine

## 2017-06-07 ENCOUNTER — Inpatient Hospital Stay (HOSPITAL_COMMUNITY): Payer: Medicare Other | Admitting: Anesthesiology

## 2017-06-07 ENCOUNTER — Emergency Department (HOSPITAL_COMMUNITY): Payer: Medicare Other

## 2017-06-07 ENCOUNTER — Encounter (HOSPITAL_COMMUNITY)
Admission: EM | Disposition: A | Discharge status: Skilled Nursing Facility | Payer: Self-pay | Source: Home / Self Care | Attending: Family Medicine

## 2017-06-07 ENCOUNTER — Inpatient Hospital Stay (HOSPITAL_COMMUNITY)
Admission: EM | Admit: 2017-06-07 | Discharge: 2017-06-12 | DRG: 480 | Disposition: A | Discharge status: Skilled Nursing Facility | Payer: Medicare Other | Attending: Family Medicine | Admitting: Family Medicine

## 2017-06-07 ENCOUNTER — Inpatient Hospital Stay (HOSPITAL_COMMUNITY): Payer: Medicare Other

## 2017-06-07 DIAGNOSIS — N184 Chronic kidney disease, stage 4 (severe): Secondary | ICD-10-CM | POA: Diagnosis not present

## 2017-06-07 DIAGNOSIS — I351 Nonrheumatic aortic (valve) insufficiency: Secondary | ICD-10-CM | POA: Diagnosis present

## 2017-06-07 DIAGNOSIS — Z8546 Personal history of malignant neoplasm of prostate: Secondary | ICD-10-CM | POA: Diagnosis not present

## 2017-06-07 DIAGNOSIS — Z79899 Other long term (current) drug therapy: Secondary | ICD-10-CM

## 2017-06-07 DIAGNOSIS — Z8674 Personal history of sudden cardiac arrest: Secondary | ICD-10-CM

## 2017-06-07 DIAGNOSIS — Z91041 Radiographic dye allergy status: Secondary | ICD-10-CM | POA: Diagnosis not present

## 2017-06-07 DIAGNOSIS — Z888 Allergy status to other drugs, medicaments and biological substances status: Secondary | ICD-10-CM

## 2017-06-07 DIAGNOSIS — M1990 Unspecified osteoarthritis, unspecified site: Secondary | ICD-10-CM | POA: Diagnosis present

## 2017-06-07 DIAGNOSIS — I5030 Unspecified diastolic (congestive) heart failure: Secondary | ICD-10-CM | POA: Diagnosis not present

## 2017-06-07 DIAGNOSIS — I480 Paroxysmal atrial fibrillation: Secondary | ICD-10-CM | POA: Diagnosis present

## 2017-06-07 DIAGNOSIS — S72141A Displaced intertrochanteric fracture of right femur, initial encounter for closed fracture: Principal | ICD-10-CM | POA: Diagnosis present

## 2017-06-07 DIAGNOSIS — W1830XA Fall on same level, unspecified, initial encounter: Secondary | ICD-10-CM | POA: Diagnosis present

## 2017-06-07 DIAGNOSIS — D62 Acute posthemorrhagic anemia: Secondary | ICD-10-CM | POA: Diagnosis not present

## 2017-06-07 DIAGNOSIS — Z4789 Encounter for other orthopedic aftercare: Secondary | ICD-10-CM | POA: Diagnosis not present

## 2017-06-07 DIAGNOSIS — I10 Essential (primary) hypertension: Secondary | ICD-10-CM | POA: Diagnosis not present

## 2017-06-07 DIAGNOSIS — I350 Nonrheumatic aortic (valve) stenosis: Secondary | ICD-10-CM | POA: Diagnosis not present

## 2017-06-07 DIAGNOSIS — I4892 Unspecified atrial flutter: Secondary | ICD-10-CM | POA: Diagnosis not present

## 2017-06-07 DIAGNOSIS — N132 Hydronephrosis with renal and ureteral calculous obstruction: Secondary | ICD-10-CM | POA: Diagnosis present

## 2017-06-07 DIAGNOSIS — I132 Hypertensive heart and chronic kidney disease with heart failure and with stage 5 chronic kidney disease, or end stage renal disease: Secondary | ICD-10-CM | POA: Diagnosis not present

## 2017-06-07 DIAGNOSIS — I4891 Unspecified atrial fibrillation: Secondary | ICD-10-CM | POA: Diagnosis present

## 2017-06-07 DIAGNOSIS — K5900 Constipation, unspecified: Secondary | ICD-10-CM | POA: Diagnosis present

## 2017-06-07 DIAGNOSIS — S79919A Unspecified injury of unspecified hip, initial encounter: Secondary | ICD-10-CM | POA: Diagnosis not present

## 2017-06-07 DIAGNOSIS — S199XXA Unspecified injury of neck, initial encounter: Secondary | ICD-10-CM | POA: Diagnosis not present

## 2017-06-07 DIAGNOSIS — I5033 Acute on chronic diastolic (congestive) heart failure: Secondary | ICD-10-CM | POA: Diagnosis present

## 2017-06-07 DIAGNOSIS — S72001A Fracture of unspecified part of neck of right femur, initial encounter for closed fracture: Secondary | ICD-10-CM

## 2017-06-07 DIAGNOSIS — S92153A Displaced avulsion fracture (chip fracture) of unspecified talus, initial encounter for closed fracture: Secondary | ICD-10-CM | POA: Diagnosis not present

## 2017-06-07 DIAGNOSIS — Z923 Personal history of irradiation: Secondary | ICD-10-CM

## 2017-06-07 DIAGNOSIS — E871 Hypo-osmolality and hyponatremia: Secondary | ICD-10-CM | POA: Diagnosis present

## 2017-06-07 DIAGNOSIS — Z86718 Personal history of other venous thrombosis and embolism: Secondary | ICD-10-CM | POA: Diagnosis not present

## 2017-06-07 DIAGNOSIS — D649 Anemia, unspecified: Secondary | ICD-10-CM | POA: Diagnosis present

## 2017-06-07 DIAGNOSIS — Y92009 Unspecified place in unspecified non-institutional (private) residence as the place of occurrence of the external cause: Secondary | ICD-10-CM

## 2017-06-07 DIAGNOSIS — M62551 Muscle wasting and atrophy, not elsewhere classified, right thigh: Secondary | ICD-10-CM | POA: Diagnosis not present

## 2017-06-07 DIAGNOSIS — I82532 Chronic embolism and thrombosis of left popliteal vein: Secondary | ICD-10-CM | POA: Diagnosis present

## 2017-06-07 DIAGNOSIS — R2689 Other abnormalities of gait and mobility: Secondary | ICD-10-CM | POA: Diagnosis not present

## 2017-06-07 DIAGNOSIS — I130 Hypertensive heart and chronic kidney disease with heart failure and stage 1 through stage 4 chronic kidney disease, or unspecified chronic kidney disease: Secondary | ICD-10-CM | POA: Diagnosis present

## 2017-06-07 DIAGNOSIS — Z419 Encounter for procedure for purposes other than remedying health state, unspecified: Secondary | ICD-10-CM

## 2017-06-07 DIAGNOSIS — Z880 Allergy status to penicillin: Secondary | ICD-10-CM

## 2017-06-07 DIAGNOSIS — Z87891 Personal history of nicotine dependence: Secondary | ICD-10-CM | POA: Diagnosis not present

## 2017-06-07 DIAGNOSIS — Y9389 Activity, other specified: Secondary | ICD-10-CM

## 2017-06-07 DIAGNOSIS — R278 Other lack of coordination: Secondary | ICD-10-CM | POA: Diagnosis not present

## 2017-06-07 DIAGNOSIS — M25551 Pain in right hip: Secondary | ICD-10-CM | POA: Diagnosis not present

## 2017-06-07 DIAGNOSIS — D631 Anemia in chronic kidney disease: Secondary | ICD-10-CM | POA: Diagnosis present

## 2017-06-07 DIAGNOSIS — M84451S Pathological fracture, right femur, sequela: Secondary | ICD-10-CM | POA: Diagnosis not present

## 2017-06-07 DIAGNOSIS — Z9079 Acquired absence of other genital organ(s): Secondary | ICD-10-CM | POA: Diagnosis not present

## 2017-06-07 DIAGNOSIS — Z9181 History of falling: Secondary | ICD-10-CM | POA: Diagnosis not present

## 2017-06-07 DIAGNOSIS — N185 Chronic kidney disease, stage 5: Secondary | ICD-10-CM | POA: Diagnosis not present

## 2017-06-07 DIAGNOSIS — G8911 Acute pain due to trauma: Secondary | ICD-10-CM | POA: Diagnosis not present

## 2017-06-07 DIAGNOSIS — W19XXXA Unspecified fall, initial encounter: Secondary | ICD-10-CM

## 2017-06-07 DIAGNOSIS — N179 Acute kidney failure, unspecified: Secondary | ICD-10-CM | POA: Diagnosis present

## 2017-06-07 DIAGNOSIS — S0990XA Unspecified injury of head, initial encounter: Secondary | ICD-10-CM | POA: Diagnosis not present

## 2017-06-07 DIAGNOSIS — R918 Other nonspecific abnormal finding of lung field: Secondary | ICD-10-CM | POA: Diagnosis not present

## 2017-06-07 HISTORY — DX: Anemia, unspecified: D64.9

## 2017-06-07 HISTORY — PX: INTRAMEDULLARY (IM) NAIL INTERTROCHANTERIC: SHX5875

## 2017-06-07 HISTORY — DX: Malignant neoplasm of prostate: C61

## 2017-06-07 HISTORY — DX: Pneumonia, unspecified organism: J18.9

## 2017-06-07 HISTORY — DX: Personal history of other medical treatment: Z92.89

## 2017-06-07 HISTORY — DX: Acute embolism and thrombosis of unspecified deep veins of unspecified lower extremity: I82.409

## 2017-06-07 LAB — URINALYSIS, ROUTINE W REFLEX MICROSCOPIC
Bilirubin Urine: NEGATIVE
Glucose, UA: NEGATIVE mg/dL
Hgb urine dipstick: NEGATIVE
Ketones, ur: NEGATIVE mg/dL
Leukocytes, UA: NEGATIVE
Nitrite: NEGATIVE
Protein, ur: 100 mg/dL — AB
RBC / HPF: NONE SEEN RBC/hpf (ref 0–5)
Specific Gravity, Urine: 1.013 (ref 1.005–1.030)
Squamous Epithelial / HPF: NONE SEEN
pH: 5 (ref 5.0–8.0)

## 2017-06-07 LAB — CBC WITH DIFFERENTIAL/PLATELET
Basophils Absolute: 0 10*3/uL (ref 0.0–0.1)
Basophils Relative: 0 %
Eosinophils Absolute: 0.1 10*3/uL (ref 0.0–0.7)
Eosinophils Relative: 2 %
HCT: 25.6 % — ABNORMAL LOW (ref 39.0–52.0)
Hemoglobin: 9 g/dL — ABNORMAL LOW (ref 13.0–17.0)
Lymphocytes Relative: 16 %
Lymphs Abs: 1.1 10*3/uL (ref 0.7–4.0)
MCH: 28.2 pg (ref 26.0–34.0)
MCHC: 35.2 g/dL (ref 30.0–36.0)
MCV: 80.3 fL (ref 78.0–100.0)
Monocytes Absolute: 0.6 10*3/uL (ref 0.1–1.0)
Monocytes Relative: 8 %
Neutro Abs: 5.4 10*3/uL (ref 1.7–7.7)
Neutrophils Relative %: 74 %
Platelets: 148 10*3/uL — ABNORMAL LOW (ref 150–400)
RBC: 3.19 MIL/uL — ABNORMAL LOW (ref 4.22–5.81)
RDW: 13.7 % (ref 11.5–15.5)
WBC: 7.2 10*3/uL (ref 4.0–10.5)

## 2017-06-07 LAB — COMPREHENSIVE METABOLIC PANEL
ALT: 18 U/L (ref 17–63)
AST: 30 U/L (ref 15–41)
Albumin: 3.3 g/dL — ABNORMAL LOW (ref 3.5–5.0)
Alkaline Phosphatase: 62 U/L (ref 38–126)
Anion gap: 10 (ref 5–15)
BUN: 76 mg/dL — ABNORMAL HIGH (ref 6–20)
CO2: 17 mmol/L — ABNORMAL LOW (ref 22–32)
Calcium: 8.8 mg/dL — ABNORMAL LOW (ref 8.9–10.3)
Chloride: 107 mmol/L (ref 101–111)
Creatinine, Ser: 3.73 mg/dL — ABNORMAL HIGH (ref 0.61–1.24)
GFR calc Af Amer: 15 mL/min — ABNORMAL LOW (ref >60)
GFR calc non Af Amer: 13 mL/min — ABNORMAL LOW (ref >60)
Glucose, Bld: 133 mg/dL — ABNORMAL HIGH (ref 65–99)
Potassium: 4.5 mmol/L (ref 3.5–5.1)
Sodium: 134 mmol/L — ABNORMAL LOW (ref 135–145)
Total Bilirubin: 0.9 mg/dL (ref 0.3–1.2)
Total Protein: 6.6 g/dL (ref 6.5–8.1)

## 2017-06-07 LAB — BRAIN NATRIURETIC PEPTIDE: B Natriuretic Peptide: 409.3 pg/mL — ABNORMAL HIGH (ref 0.0–100.0)

## 2017-06-07 LAB — ABO/RH: ABO/RH(D): AB POS

## 2017-06-07 LAB — PREPARE RBC (CROSSMATCH)

## 2017-06-07 SURGERY — FIXATION, FRACTURE, INTERTROCHANTERIC, WITH INTRAMEDULLARY ROD
Anesthesia: General | Site: Leg Upper | Laterality: Right

## 2017-06-07 MED ORDER — DOCUSATE SODIUM 100 MG PO CAPS: 100.0000 mg | ORAL_CAPSULE | Freq: Two times a day (BID) | ORAL | Status: DC

## 2017-06-07 MED ORDER — FUROSEMIDE 40 MG PO TABS
40.0000 mg | ORAL_TABLET | Freq: Every day | ORAL | Status: DC
  Administered 2017-06-08 – 2017-06-10 (×3): 40 mg via ORAL
  Filled 2017-06-07 (×3): qty 1

## 2017-06-07 MED ORDER — ONDANSETRON HCL 4 MG/2ML IJ SOLN: 4.0000 mg | Freq: Four times a day (QID) | INTRAMUSCULAR | Status: DC | PRN

## 2017-06-07 MED ORDER — LACTATED RINGERS IV SOLN
INTRAVENOUS | Status: DC
  Administered 2017-06-07: 1 mL via INTRAVENOUS

## 2017-06-07 MED ORDER — MORPHINE SULFATE (PF) 2 MG/ML IV SOLN: 0.5000 mg | INTRAVENOUS | Status: DC | PRN

## 2017-06-07 MED ORDER — PROPOFOL 500 MG/50ML IV EMUL
INTRAVENOUS | Status: DC | PRN
  Administered 2017-06-07: 50 ug/kg/min via INTRAVENOUS

## 2017-06-07 MED ORDER — PROPOFOL 10 MG/ML IV BOLUS
INTRAVENOUS | Status: DC | PRN
  Administered 2017-06-07: 100 mg via INTRAVENOUS

## 2017-06-07 MED ORDER — FUROSEMIDE 10 MG/ML IJ SOLN: 80.0000 mg | Freq: Two times a day (BID) | INTRAMUSCULAR | Status: DC

## 2017-06-07 MED ORDER — PROPOFOL 10 MG/ML IV BOLUS
INTRAVENOUS | Status: AC
Start: 2017-06-07 — End: 2017-06-07
  Filled 2017-06-07: qty 20

## 2017-06-07 MED ORDER — ACETAMINOPHEN 650 MG RE SUPP
650.0000 mg | Freq: Four times a day (QID) | RECTAL | Status: DC | PRN
Start: 2017-06-07 — End: 2017-06-12

## 2017-06-07 MED ORDER — CEFAZOLIN SODIUM-DEXTROSE 2-3 GM-%(50ML) IV SOLR
INTRAVENOUS | Status: DC | PRN
  Administered 2017-06-07: 2 g via INTRAVENOUS

## 2017-06-07 MED ORDER — FENTANYL CITRATE (PF) 250 MCG/5ML IJ SOLN
INTRAMUSCULAR | Status: AC
  Filled 2017-06-07: qty 5

## 2017-06-07 MED ORDER — OXYCODONE HCL 5 MG PO TABS
5.0000 mg | ORAL_TABLET | Freq: Four times a day (QID) | ORAL | Status: DC | PRN
  Administered 2017-06-08: 5 mg via ORAL
  Filled 2017-06-07 (×2): qty 1

## 2017-06-07 MED ORDER — FENTANYL CITRATE (PF) 100 MCG/2ML IJ SOLN: 25.0000 ug | INTRAMUSCULAR | Status: DC | PRN

## 2017-06-07 MED ORDER — MENTHOL 3 MG MT LOZG: 1.0000 | LOZENGE | OROMUCOSAL | Status: DC | PRN

## 2017-06-07 MED ORDER — POTASSIUM CHLORIDE CRYS ER 20 MEQ PO TBCR
40.0000 meq | EXTENDED_RELEASE_TABLET | Freq: Two times a day (BID) | ORAL | Status: DC
  Administered 2017-06-07: 40 meq via ORAL
  Filled 2017-06-07 (×2): qty 2

## 2017-06-07 MED ORDER — AMLODIPINE BESYLATE 10 MG PO TABS
10.0000 mg | ORAL_TABLET | Freq: Every day | ORAL | Status: DC
  Administered 2017-06-07 – 2017-06-12 (×6): 10 mg via ORAL
  Filled 2017-06-07 (×6): qty 1

## 2017-06-07 MED ORDER — POLYETHYLENE GLYCOL 3350 17 G PO PACK
17.0000 g | PACK | Freq: Every day | ORAL | Status: DC | PRN
  Filled 2017-06-07: qty 1

## 2017-06-07 MED ORDER — MORPHINE SULFATE (PF) 4 MG/ML IV SOLN
2.0000 mg | Freq: Once | INTRAVENOUS | Status: AC
  Administered 2017-06-07: 2 mg via INTRAVENOUS
  Filled 2017-06-07: qty 1

## 2017-06-07 MED ORDER — 0.9 % SODIUM CHLORIDE (POUR BTL) OPTIME
TOPICAL | Status: DC | PRN
  Administered 2017-06-07: 1000 mL

## 2017-06-07 MED ORDER — HYDROCODONE-ACETAMINOPHEN 5-325 MG PO TABS
1.0000 | ORAL_TABLET | Freq: Four times a day (QID) | ORAL | Status: DC | PRN
  Administered 2017-06-07 – 2017-06-12 (×12): 2 via ORAL
  Filled 2017-06-07 (×12): qty 2

## 2017-06-07 MED ORDER — SENNA 8.6 MG PO TABS
1.0000 | ORAL_TABLET | Freq: Two times a day (BID) | ORAL | Status: DC
  Administered 2017-06-07 – 2017-06-12 (×9): 8.6 mg via ORAL
  Filled 2017-06-07 (×9): qty 1

## 2017-06-07 MED ORDER — ONDANSETRON HCL 4 MG/2ML IJ SOLN: 4.0000 mg | Freq: Once | INTRAMUSCULAR | Status: DC | PRN

## 2017-06-07 MED ORDER — ACETAMINOPHEN 325 MG PO TABS
650.0000 mg | ORAL_TABLET | Freq: Four times a day (QID) | ORAL | Status: DC | PRN
  Filled 2017-06-07 (×2): qty 2

## 2017-06-07 MED ORDER — ONDANSETRON HCL 4 MG/2ML IJ SOLN
INTRAMUSCULAR | Status: DC | PRN
  Administered 2017-06-07: 4 mg via INTRAVENOUS

## 2017-06-07 MED ORDER — DOXAZOSIN MESYLATE 1 MG PO TABS
1.0000 mg | ORAL_TABLET | Freq: Every evening | ORAL | Status: DC
  Administered 2017-06-07 – 2017-06-11 (×5): 1 mg via ORAL
  Filled 2017-06-07 (×5): qty 1

## 2017-06-07 MED ORDER — MORPHINE SULFATE (PF) 4 MG/ML IV SOLN
0.5000 mg | INTRAVENOUS | Status: DC | PRN
  Administered 2017-06-07 – 2017-06-09 (×2): 0.52 mg via INTRAVENOUS
  Filled 2017-06-07 (×2): qty 1

## 2017-06-07 MED ORDER — ENOXAPARIN SODIUM 30 MG/0.3ML ~~LOC~~ SOLN
30.0000 mg | SUBCUTANEOUS | Status: DC
  Administered 2017-06-08 – 2017-06-12 (×5): 30 mg via SUBCUTANEOUS
  Filled 2017-06-07 (×5): qty 0.3

## 2017-06-07 MED ORDER — BISACODYL 10 MG RE SUPP: 10.0000 mg | Freq: Every day | RECTAL | Status: DC | PRN

## 2017-06-07 MED ORDER — ISOSORBIDE MONONITRATE ER 60 MG PO TB24
60.0000 mg | ORAL_TABLET | Freq: Every day | ORAL | Status: DC
  Administered 2017-06-07 – 2017-06-12 (×6): 60 mg via ORAL
  Filled 2017-06-07 (×6): qty 1

## 2017-06-07 MED ORDER — PHENYLEPHRINE HCL 10 MG/ML IJ SOLN
INTRAVENOUS | Status: DC | PRN
  Administered 2017-06-07: 50 ug/min via INTRAVENOUS

## 2017-06-07 MED ORDER — FERROUS SULFATE 325 (65 FE) MG PO TABS
325.0000 mg | ORAL_TABLET | Freq: Every day | ORAL | Status: DC
  Administered 2017-06-08 – 2017-06-12 (×5): 325 mg via ORAL
  Filled 2017-06-07 (×5): qty 1

## 2017-06-07 MED ORDER — LIDOCAINE HCL (CARDIAC) 20 MG/ML IV SOLN
INTRAVENOUS | Status: DC | PRN
  Administered 2017-06-07: 60 mg via INTRAVENOUS

## 2017-06-07 MED ORDER — FENTANYL CITRATE (PF) 100 MCG/2ML IJ SOLN
50.0000 ug | Freq: Once | INTRAMUSCULAR | Status: AC
  Administered 2017-06-07: 50 ug via INTRAVENOUS
  Filled 2017-06-07: qty 2

## 2017-06-07 MED ORDER — LACTATED RINGERS IV SOLN
INTRAVENOUS | Status: DC | PRN
  Administered 2017-06-07: 16:00:00 via INTRAVENOUS

## 2017-06-07 MED ORDER — FUROSEMIDE 10 MG/ML IJ SOLN
80.0000 mg | Freq: Once | INTRAMUSCULAR | Status: AC
  Administered 2017-06-07: 80 mg via INTRAVENOUS
  Filled 2017-06-07: qty 8

## 2017-06-07 MED ORDER — PHENOL 1.4 % MT LIQD: 1.0000 | OROMUCOSAL | Status: DC | PRN

## 2017-06-07 MED ORDER — FENTANYL CITRATE (PF) 100 MCG/2ML IJ SOLN
INTRAMUSCULAR | Status: DC | PRN
  Administered 2017-06-07 (×4): 25 ug via INTRAVENOUS

## 2017-06-07 MED ORDER — ONDANSETRON HCL 4 MG PO TABS: 4.0000 mg | ORAL_TABLET | Freq: Four times a day (QID) | ORAL | Status: DC | PRN

## 2017-06-07 MED ORDER — HYDRALAZINE HCL 50 MG PO TABS
50.0000 mg | ORAL_TABLET | Freq: Three times a day (TID) | ORAL | Status: DC
  Administered 2017-06-09 – 2017-06-12 (×10): 50 mg via ORAL
  Filled 2017-06-07 (×12): qty 1

## 2017-06-07 MED ORDER — DOCUSATE SODIUM 100 MG PO CAPS
100.0000 mg | ORAL_CAPSULE | Freq: Two times a day (BID) | ORAL | Status: DC
  Administered 2017-06-07 – 2017-06-10 (×5): 100 mg via ORAL
  Filled 2017-06-07 (×6): qty 1

## 2017-06-07 SURGICAL SUPPLY — 38 items
BIT DRILL CANN LG 4.3MM (BIT) ×1 IMPLANT
BLADE SURG 15 STRL LF DISP TIS (BLADE) ×1 IMPLANT
BLADE SURG 15 STRL SS (BLADE) ×2
CANISTER SUCT 3000ML PPV (MISCELLANEOUS) ×3 IMPLANT
CHLORAPREP W/TINT 26ML (MISCELLANEOUS) ×3 IMPLANT
COVER MAYO STAND STRL (DRAPES) ×3 IMPLANT
COVER PERINEAL POST (MISCELLANEOUS) ×3 IMPLANT
COVER SURGICAL LIGHT HANDLE (MISCELLANEOUS) ×3 IMPLANT
DRAPE STERI IOBAN 125X83 (DRAPES) ×3 IMPLANT
DRAPE U-SHAPE 47X51 STRL (DRAPES) ×3 IMPLANT
DRILL BIT CANN LG 4.3MM (BIT) ×3
DRSG MEPILEX BORDER 4X4 (GAUZE/BANDAGES/DRESSINGS) ×3 IMPLANT
DRSG MEPILEX BORDER 4X8 (GAUZE/BANDAGES/DRESSINGS) ×3 IMPLANT
ELECT REM PT RETURN 9FT ADLT (ELECTROSURGICAL) ×3
ELECTRODE REM PT RTRN 9FT ADLT (ELECTROSURGICAL) ×1 IMPLANT
GLOVE BIO SURGEON STRL SZ7 (GLOVE) ×3 IMPLANT
GLOVE BIO SURGEON STRL SZ8 (GLOVE) ×3 IMPLANT
GLOVE BIOGEL PI IND STRL 8 (GLOVE) ×1 IMPLANT
GLOVE BIOGEL PI INDICATOR 8 (GLOVE) ×2
GOWN STRL REUS W/ TWL LRG LVL3 (GOWN DISPOSABLE) ×1 IMPLANT
GOWN STRL REUS W/ TWL XL LVL3 (GOWN DISPOSABLE) ×2 IMPLANT
GOWN STRL REUS W/TWL LRG LVL3 (GOWN DISPOSABLE) ×2
GOWN STRL REUS W/TWL XL LVL3 (GOWN DISPOSABLE) ×4
GUIDEPIN 3.2X17.5 THRD DISP (PIN) ×3 IMPLANT
KIT BASIN OR (CUSTOM PROCEDURE TRAY) ×3 IMPLANT
KIT ROOM TURNOVER OR (KITS) ×3 IMPLANT
LINER BOOT UNIVERSAL DISP (MISCELLANEOUS) ×3 IMPLANT
NAIL HIP FRACT 130D 11X180 (Screw) ×3 IMPLANT
NS IRRIG 1000ML POUR BTL (IV SOLUTION) ×3 IMPLANT
PACK GENERAL/GYN (CUSTOM PROCEDURE TRAY) ×3 IMPLANT
PAD ARMBOARD 7.5X6 YLW CONV (MISCELLANEOUS) ×6 IMPLANT
SCREW BONE CORTICAL 5.0X36 (Screw) ×3 IMPLANT
SCREW LAG 10.5MMX105MM HFN (Screw) ×3 IMPLANT
SPONGE LAP 18X18 X RAY DECT (DISPOSABLE) ×3 IMPLANT
STAPLER VISISTAT 35W (STAPLE) ×6 IMPLANT
SUT MNCRL AB 3-0 PS2 18 (SUTURE) ×3 IMPLANT
SUT VIC AB 0 CT1 27 (SUTURE) ×2
SUT VIC AB 0 CT1 27XBRD ANBCTR (SUTURE) ×1 IMPLANT

## 2017-06-07 NOTE — Anesthesia Preprocedure Evaluation (Addendum)
Anesthesia Evaluation  Patient identified by MRN, date of birth, ID band Patient awake    Reviewed: Allergy & Precautions, NPO status , Patient's Chart, lab work & pertinent test results  Airway Mallampati: II  TM Distance: >3 FB Neck ROM: Full    Dental  (+) Edentulous Upper, Edentulous Lower   Pulmonary former smoker,    Pulmonary exam normal breath sounds clear to auscultation       Cardiovascular hypertension, Pt. on medications +CHF  Normal cardiovascular exam+ dysrhythmias Atrial Fibrillation + Valvular Problems/Murmurs AS and AI  Rhythm:Regular Rate:Normal + Systolic murmurs TTE 1638 - Moderate concentric LVH. Ejection fraction was in the range of 65% to 70%. Grade 1 diastolic dysfunction. Mild AS, AI. Mean gradient (S): 15 mm Hg. Valve area (VTI): 1.66 cm^2. Valve area (Vmax): 1.4 cm^2.   Valve area (Vmean): 1.37 cm^2. Mild MR, TR, and PR. Left atrium was mildly dilated. PASP was at the upper limits of normal. PA peak pressure: 35 mm Hg (S).   Neuro/Psych negative neurological ROS  negative psych ROS   GI/Hepatic negative GI ROS, Neg liver ROS,   Endo/Other  negative endocrine ROS  Renal/GU CRFRenal disease   Prostate cancer    Musculoskeletal  (+) Arthritis , Scoliosis   Abdominal   Peds  Hematology  (+) anemia ,   Anesthesia Other Findings   Reproductive/Obstetrics                            Anesthesia Physical Anesthesia Plan  ASA: III  Anesthesia Plan: General   Post-op Pain Management:    Induction: Intravenous  PONV Risk Score and Plan: 3 and Ondansetron, Propofol infusion and Treatment may vary due to age or medical condition  Airway Management Planned: LMA  Additional Equipment: None  Intra-op Plan:   Post-operative Plan: Extubation in OR  Informed Consent: I have reviewed the patients History and Physical, chart, labs and discussed the procedure including the  risks, benefits and alternatives for the proposed anesthesia with the patient or authorized representative who has indicated his/her understanding and acceptance.   Dental advisory given  Plan Discussed with: CRNA  Anesthesia Plan Comments: (Plan for TIVA)        Anesthesia Quick Evaluation

## 2017-06-07 NOTE — Consult Note (Signed)
Reason for Consult:  Right hip pain Referring Physician: Dr. Letitia Neri Mesta is an 83 y.o. male.  HPI: 83 y/o male with PMH of prostate cancer c/o pain in the right hip since a fall this morning. He last ate yesterday evening at dinnertime.  He c/o aching pain in the right hip that is worse with motion and better with rest.  No h/o previous hip injury or surgery.  He takes no blood thinners.  Past Medical History:  Diagnosis Date  . Arthritis   . Cancer [facility])    Prostate cancer, follows with hematologists q 6 months. S/p radical prostatectomy and proton therapy in 2001.  . Cataract   . CKD (chronic kidney disease)    Follows with nephrologists.  . Hypertension   . Radiation cystitis   . Retinal hemorrhage of both eyes    follows with ophthalmologists.    Past Surgical History:  Procedure Laterality Date  . HERNIA REPAIR    prostatectomy  Family History  Problem Relation Age of Onset  . Cancer Father   . Heart attack Neg Hx     Social History:  reports that he has quit smoking. His smoking use included Cigarettes. He has never used smokeless tobacco. He reports that he drinks alcohol. He reports that he does not use drugs.  Allergies:  Allergies  Allergen Reactions  . Iodinated Diagnostic Agents Rash    Other Reaction: Allergy  . Penicillins Rash    Medications: I have reviewed the patient's current medications.  Results for orders placed or performed during the hospital encounter of 06/07/17 (from the past 48 hour(s))  Comprehensive metabolic panel     Status: Abnormal   Collection Time: 06/07/17 10:22 AM  Result Value Ref Range   Sodium 134 (L) 135 - 145 mmol/L   Potassium 4.5 3.5 - 5.1 mmol/L   Chloride 107 101 - 111 mmol/L   CO2 17 (L) 22 - 32 mmol/L   Glucose, Bld 133 (H) 65 - 99 mg/dL   BUN 76 (H) 6 - 20 mg/dL   Creatinine, Ser 3.73 (H) 0.61 - 1.24 mg/dL   Calcium 8.8 (L) 8.9 - 10.3 mg/dL   Total Protein 6.6 6.5 - 8.1 g/dL   Albumin 3.3 (L) 3.5 - 5.0  g/dL   AST 30 15 - 41 U/L   ALT 18 17 - 63 U/L   Alkaline Phosphatase 62 38 - 126 U/L   Total Bilirubin 0.9 0.3 - 1.2 mg/dL   GFR calc non Af Amer 13 (L) >60 mL/min   GFR calc Af Amer 15 (L) >60 mL/min    Comment: (NOTE) The eGFR has been calculated using the CKD EPI equation. This calculation has not been validated in all clinical situations. eGFR's persistently <60 mL/min signify possible Chronic Kidney Disease.    Anion gap 10 5 - 15  CBC with Differential     Status: Abnormal   Collection Time: 06/07/17 10:22 AM  Result Value Ref Range   WBC 7.2 4.0 - 10.5 K/uL   RBC 3.19 (L) 4.22 - 5.81 MIL/uL   Hemoglobin 9.0 (L) 13.0 - 17.0 g/dL   HCT 25.6 (L) 39.0 - 52.0 %   MCV 80.3 78.0 - 100.0 fL   MCH 28.2 26.0 - 34.0 pg   MCHC 35.2 30.0 - 36.0 g/dL   RDW 13.7 11.5 - 15.5 %   Platelets 148 (L) 150 - 400 K/uL   Neutrophils Relative % 74 %   Neutro Abs 5.4  1.7 - 7.7 K/uL   Lymphocytes Relative 16 %   Lymphs Abs 1.1 0.7 - 4.0 K/uL   Monocytes Relative 8 %   Monocytes Absolute 0.6 0.1 - 1.0 K/uL   Eosinophils Relative 2 %   Eosinophils Absolute 0.1 0.0 - 0.7 K/uL   Basophils Relative 0 %   Basophils Absolute 0.0 0.0 - 0.1 K/uL    Dg Chest 1 View  Result Date: 06/07/2017 CLINICAL DATA:  Acute right hip fracture. Pre-op respiratory exam. Prostate carcinoma. EXAM: CHEST 1 VIEW COMPARISON:  09/30/2016 FINDINGS: Heart size is stable.  Severe thoracolumbar dextroscoliosis. Diffuse nodular pulmonary interstitial prominence is new since previous study. This is nonspecific, and may be due to pulmonary edema, viral pneumonitis, or possibly metastatic disease. No evidence of pneumothorax or pleural effusion. IMPRESSION: New diffuse nodular pulmonary interstitial prominence, which is nonspecific. Differential considerations include pulmonary edema, viral pneumonitis, or possibly metastatic disease. Recommend clinical correlation, and consider continued chest radiographic followup versus chest CT.  Electronically Signed   By: Earle Gell M.D.   On: 06/07/2017 11:49   Ct Head Wo Contrast  Result Date: 06/07/2017 CLINICAL DATA:  Recent fall, trauma, high risk for injury EXAM: CT HEAD WITHOUT CONTRAST CT CERVICAL SPINE WITHOUT CONTRAST TECHNIQUE: Multidetector CT imaging of the head and cervical spine was performed following the standard protocol without intravenous contrast. Multiplanar CT image reconstructions of the cervical spine were also generated. COMPARISON:  None available FINDINGS: CT HEAD FINDINGS Brain: Brain atrophy evident with advanced chronic white matter microvascular ischemic changes throughout both cerebral hemispheres. No acute intracranial hemorrhage, mass lesion, definite new infarction, midline shift, herniation, hydrocephalus, or extra-axial fluid collection. No focal mass effect or edema. Cisterns are patent. Cerebellar atrophy as well. Vascular: Intracranial atherosclerosis noted.  No hyperdense vessel. Skull: No depressed skull fracture.  Left mastoid effusion noted. Sinuses/Orbits: Chronic scattered sinus mucosal thickening. Orbits are unremarkable and symmetric. Other: None. CT CERVICAL SPINE FINDINGS Alignment: Normal alignment. No subluxation or dislocation. Facets are aligned. Multilevel facet arthropathy noted. Skull base and vertebrae: Bones are osteopenic. No acute osseous finding or fracture. Soft tissues and spinal canal: No prevertebral fluid or swelling. No visible canal hematoma. Carotid atherosclerosis evident. Disc levels: Multilevel degenerative spondylosis noted with disc space narrowing, sclerosis and endplate osteophytes spanning C2-C7 at all levels. Facet arthropathy most pronounced at C4-5 on the left. Upper chest: No acute finding.  Apical scarring. Other: None. IMPRESSION: Brain atrophy and chronic white matter microvascular changes. No acute intracranial abnormality by noncontrast CT Nonspecific left mastoid effusion Minor chronic sinus disease Cervical  degenerative spondylosis and osteopenia without acute osseous finding, fracture or malalignment by CT. Electronically Signed   By: Jerilynn Mages.  Shick M.D.   On: 06/07/2017 11:35   Ct Cervical Spine Wo Contrast  Result Date: 06/07/2017 CLINICAL DATA:  Recent fall, trauma, high risk for injury EXAM: CT HEAD WITHOUT CONTRAST CT CERVICAL SPINE WITHOUT CONTRAST TECHNIQUE: Multidetector CT imaging of the head and cervical spine was performed following the standard protocol without intravenous contrast. Multiplanar CT image reconstructions of the cervical spine were also generated. COMPARISON:  None available FINDINGS: CT HEAD FINDINGS Brain: Brain atrophy evident with advanced chronic white matter microvascular ischemic changes throughout both cerebral hemispheres. No acute intracranial hemorrhage, mass lesion, definite new infarction, midline shift, herniation, hydrocephalus, or extra-axial fluid collection. No focal mass effect or edema. Cisterns are patent. Cerebellar atrophy as well. Vascular: Intracranial atherosclerosis noted.  No hyperdense vessel. Skull: No depressed skull fracture.  Left mastoid effusion  noted. Sinuses/Orbits: Chronic scattered sinus mucosal thickening. Orbits are unremarkable and symmetric. Other: None. CT CERVICAL SPINE FINDINGS Alignment: Normal alignment. No subluxation or dislocation. Facets are aligned. Multilevel facet arthropathy noted. Skull base and vertebrae: Bones are osteopenic. No acute osseous finding or fracture. Soft tissues and spinal canal: No prevertebral fluid or swelling. No visible canal hematoma. Carotid atherosclerosis evident. Disc levels: Multilevel degenerative spondylosis noted with disc space narrowing, sclerosis and endplate osteophytes spanning C2-C7 at all levels. Facet arthropathy most pronounced at C4-5 on the left. Upper chest: No acute finding.  Apical scarring. Other: None. IMPRESSION: Brain atrophy and chronic white matter microvascular changes. No acute  intracranial abnormality by noncontrast CT Nonspecific left mastoid effusion Minor chronic sinus disease Cervical degenerative spondylosis and osteopenia without acute osseous finding, fracture or malalignment by CT. Electronically Signed   By: Jerilynn Mages.  Shick M.D.   On: 06/08/2017 11:35   Dg Hip Unilat  With Pelvis 2-3 Views Right  Result Date: 06/08/17 CLINICAL DATA:  Followup getting out of bed this morning. Right hip pain. Initial encounter. EXAM: DG HIP (WITH OR WITHOUT PELVIS) 2-3V RIGHT COMPARISON:  None. FINDINGS: Comminuted intertrochanteric fracture of the right hip is seen. No evidence of dislocation. No pelvic fracture identified. Generalized osteopenia noted. Bilateral pelvic surgical clips are seen from previous lymphadenectomy. Peripheral vascular calcification noted in right thigh. IMPRESSION: Comminuted intertrochanteric right hip fracture. Electronically Signed   By: Earle Gell M.D.   On: 2017/06/08 11:43    ROS:  No recent f/c/n/v/wt loss PE:  Blood pressure (!) 151/77, pulse 65, temperature (!) 97.4 F (36.3 C), temperature source Oral, resp. rate 16, height 5' 4"  (1.626 m), weight 56.2 kg (124 lb), SpO2 97 %. wn wd male in nad.  A and O x 4.  Mood and affect normal.  EOMI.  resp unlabored.  R LE painful with IR and ER.  No lymphadenopathy.  5/5 strength in PF and DF of the ankle and toes.  Sens to LT intact at the forefoot.  Pulses 1+ and DP and PT.  Assessment/Plan: R hip intertroch fracture - to OR for IM nailing when cleared by medicine.  NPO for now.  The risks and benefits of the alternative treatment options have been discussed in detail.  The patient wishes to proceed with surgery and specifically understands risks of bleeding, infection, nerve damage, blood clots, need for additional surgery, amputation and death.   Lucas Winograd Jun 08, 2017, 1:19 PM

## 2017-06-07 NOTE — ED Provider Notes (Signed)
[facility] EMERGENCY DEPARTMENT Provider Note   CSN: [id number] Arrival date & time: 06/07/17  1005     History   Chief Complaint Chief Complaint  Patient presents with  . Fall  . Hip Injury    HPI Johnny Morrow is a 83 y.o. male with history of prostate cancer, CKD who presents with right hip pain after fall.  Patient reports he was putting on his shirt when he fell to the ground.  He reports it happened so fast he is not sure what happened.  He reports he may have lost his balance.  He did not feel dizzy or lightheaded prior to fall.  He ended up on the floor.  He was not able to get up and.  EMS was called.  He denies hitting his head or losing consciousness.  He denies any headache, neck pain or back pain.  He only has pain in his right hip.  He denies any chest pain, shortness of breath, nausea, vomiting, abdominal pain, urinary symptoms.  No medications prior to arrival.  HPI  Past Medical History:  Diagnosis Date  . Anemia   . Arthritis   . Atrial fibrillation ([place])   . Cancer [facility])    Prostate cancer, follows with hematologists q 6 months. S/p radical prostatectomy and proton therapy in 2001.  . Cataract   . CHF (congestive heart failure) ([place])   . CKD (chronic kidney disease)    Follows with nephrologists.  . DVT (deep venous thrombosis) ([place])   . Hypertension   . Radiation cystitis   . Retinal hemorrhage of both eyes    follows with ophthalmologists.    Patient Active Problem List   Diagnosis Date Noted  . Closed intertrochanteric fracture of hip, right, initial encounter ([place]) 06/07/2017  . Acute on chronic diastolic CHF (congestive heart failure) ([place]) 06/07/2017  . AF (paroxysmal atrial fibrillation) ([place]) 12/22/2016  . Pulmonary nodule less than 6 cm determined by computed tomography of lung 11/27/2016  . Normocytic anemia 11/27/2016  . Chronic kidney disease (CKD), stage IV (severe) ([place]) 08/20/2016  . Hyperlipidemia 08/20/2016  .  Moderate tricuspid regurgitation 01/11/2016  . Mild aortic stenosis 01/11/2016  . Mild aortic regurgitation 01/11/2016  . Osteoporosis 01/11/2016  . Prediabetes 12/19/2015  . Essential hypertension, benign 09/25/2015  . History of prostate cancer 09/25/2015  . Idiopathic scoliosis, thoracic 09/25/2015  . History of DVT of lower extremity, left 09/25/2015    Past Surgical History:  Procedure Laterality Date  . HERNIA REPAIR         Home Medications    Prior to Admission medications   Medication Sig Start Date End Date Taking? Authorizing Provider  albuterol (PROVENTIL HFA;VENTOLIN HFA) 108 (90 Base) MCG/ACT inhaler Inhale 2 puffs into the lungs every 6 (six) hours as needed for wheezing or shortness of breath. 08/20/16  Yes [place], Betty G, MD  amLODipine (NORVASC) 10 MG tablet Take 1 tablet (10 mg total) by mouth daily. 03/09/17  Yes [place], Betty G, MD  B Complex Vitamins (VITAMIN-B COMPLEX PO) Take 1 tablet by mouth daily.    Yes [provider]  Bacillus Coagulans-Inulin (PROBIOTIC FORMULA) 1-250 BILLION-MG CAPS Take 250 mg by mouth daily.   Yes [provider]  Cholecalciferol (VITAMIN D3) 5000 units TABS Take 5,000 Units by mouth daily.    Yes [provider]  Coenzyme Q10 (CO Q 10) 100 MG CAPS Take 100 mg by mouth daily.   Yes [provider]  doxazosin (  CARDURA) 1 MG tablet Take 1 mg by mouth every evening.   Yes [provider]  Elastic Bandages & Supports (LUMBAR BACK BRACE/SUPPORT PAD) MISC Use as directed for lumbar scoliosis 09/25/15  Yes Burns, Claudina Lick, MD  ferrous sulfate 325 (65 FE) MG tablet Take 1 tablet (325 mg total) by mouth daily with breakfast. 12/22/16  Yes [place], Betty G, MD  furosemide (LASIX) 40 MG tablet Take 40 mg by mouth daily.   Yes [provider]  hydrALAZINE (APRESOLINE) 50 MG tablet Take 1 tablet (50 mg total) by mouth 3 (three) times daily. 12/22/16  Yes [place], Betty G, MD  isosorbide mononitrate  (IMDUR) 60 MG 24 hr tablet Take 60 mg by mouth daily.   Yes [provider]  Multiple Vitamin (MULTI VITAMIN PO) Take 1 tablet by mouth daily.    Yes [provider]  Omega 3 1000 MG CAPS Take 2,000 mg by mouth daily.   Yes [provider]    Family History Family History  Problem Relation Age of Onset  . COPD Brother   . Heart attack Brother   . Cancer Father   . Heart attack Father   . Diabetes Mother     Social History Social History  Substance Use Topics  . Smoking status: Former Smoker    Types: Cigarettes  . Smokeless tobacco: Never Used  . Alcohol use Yes     Allergies   Iodinated diagnostic agents and Penicillins   Review of Systems Review of Systems  Constitutional: Negative for chills and fever.  HENT: Negative for facial swelling and sore throat.   Respiratory: Negative for shortness of breath.   Cardiovascular: Negative for chest pain.  Gastrointestinal: Negative for abdominal pain, nausea and vomiting.  Genitourinary: Negative for dysuria.  Musculoskeletal: Positive for arthralgias (R hip). Negative for back pain.  Skin: Negative for rash and wound.  Neurological: Negative for headaches.  Psychiatric/Behavioral: The patient is not nervous/anxious.      Physical Exam Updated Vital Signs BP (!) 146/70   Pulse 69   Temp (!) 97.4 F (36.3 C) (Oral)   Resp 12   Ht 5\' 4"  (1.626 m)   Wt 56.2 kg (124 lb)   SpO2 97%   BMI 21.28 kg/m   Physical Exam  Constitutional: He appears well-developed and well-nourished. No distress.  HENT:  Head: Normocephalic and atraumatic.  Mouth/Throat: Oropharynx is clear and moist. No oropharyngeal exudate.  Eyes: Pupils are equal, round, and reactive to light. Conjunctivae are normal. Right eye exhibits no discharge. Left eye exhibits no discharge. No scleral icterus.  Neck: Normal range of motion. Neck supple. No thyromegaly present.  Cardiovascular: Normal rate, regular rhythm, normal heart  sounds and intact distal pulses.  Exam reveals no gallop and no friction rub.   No murmur heard. Pulmonary/Chest: Effort normal and breath sounds normal. No stridor. No respiratory distress. He has no wheezes. He has no rales.  Abdominal: Soft. Bowel sounds are normal. He exhibits no distension. There is no tenderness. There is no rebound and no guarding.  Musculoskeletal: He exhibits no edema.  R lateral hip tenderness over the greater trochanter; right leg shortening No other tenderness on palpation including cervical, thoracic, lumbar spine  Lymphadenopathy:    He has no cervical adenopathy.  Neurological: He is alert. Coordination normal.  Skin: Skin is warm and dry. No rash noted. He is not diaphoretic. No pallor.  Psychiatric: He has a normal mood and affect.  Nursing note and  vitals reviewed.    ED Treatments / Results  Labs (all labs ordered are listed, but only abnormal results are displayed) Labs Reviewed  COMPREHENSIVE METABOLIC PANEL - Abnormal; Notable for the following:       Result Value   Sodium 134 (*)    CO2 17 (*)    Glucose, Bld 133 (*)    BUN 76 (*)    Creatinine, Ser 3.73 (*)    Calcium 8.8 (*)    Albumin 3.3 (*)    GFR calc non Af Amer 13 (*)    GFR calc Af Amer 15 (*)    All other components within normal limits  CBC WITH DIFFERENTIAL/PLATELET - Abnormal; Notable for the following:    RBC 3.19 (*)    Hemoglobin 9.0 (*)    HCT 25.6 (*)    Platelets 148 (*)    All other components within normal limits  URINALYSIS, ROUTINE W REFLEX MICROSCOPIC - Abnormal; Notable for the following:    Protein, ur 100 (*)    Bacteria, UA RARE (*)    All other components within normal limits  BRAIN NATRIURETIC PEPTIDE    EKG  EKG Interpretation None       Radiology Dg Chest 1 View  Result Date: 06/07/2017 CLINICAL DATA:  Acute right hip fracture. Pre-op respiratory exam. Prostate carcinoma. EXAM: CHEST 1 VIEW COMPARISON:  09/30/2016 FINDINGS: Heart size is  stable.  Severe thoracolumbar dextroscoliosis. Diffuse nodular pulmonary interstitial prominence is new since previous study. This is nonspecific, and may be due to pulmonary edema, viral pneumonitis, or possibly metastatic disease. No evidence of pneumothorax or pleural effusion. IMPRESSION: New diffuse nodular pulmonary interstitial prominence, which is nonspecific. Differential considerations include pulmonary edema, viral pneumonitis, or possibly metastatic disease. Recommend clinical correlation, and consider continued chest radiographic followup versus chest CT. Electronically Signed   By: Earle Gell M.D.   On: 06/07/2017 11:49   Ct Head Wo Contrast  Result Date: 06/07/2017 CLINICAL DATA:  Recent fall, trauma, high risk for injury EXAM: CT HEAD WITHOUT CONTRAST CT CERVICAL SPINE WITHOUT CONTRAST TECHNIQUE: Multidetector CT imaging of the head and cervical spine was performed following the standard protocol without intravenous contrast. Multiplanar CT image reconstructions of the cervical spine were also generated. COMPARISON:  None available FINDINGS: CT HEAD FINDINGS Brain: Brain atrophy evident with advanced chronic white matter microvascular ischemic changes throughout both cerebral hemispheres. No acute intracranial hemorrhage, mass lesion, definite new infarction, midline shift, herniation, hydrocephalus, or extra-axial fluid collection. No focal mass effect or edema. Cisterns are patent. Cerebellar atrophy as well. Vascular: Intracranial atherosclerosis noted.  No hyperdense vessel. Skull: No depressed skull fracture.  Left mastoid effusion noted. Sinuses/Orbits: Chronic scattered sinus mucosal thickening. Orbits are unremarkable and symmetric. Other: None. CT CERVICAL SPINE FINDINGS Alignment: Normal alignment. No subluxation or dislocation. Facets are aligned. Multilevel facet arthropathy noted. Skull base and vertebrae: Bones are osteopenic. No acute osseous finding or fracture. Soft tissues and  spinal canal: No prevertebral fluid or swelling. No visible canal hematoma. Carotid atherosclerosis evident. Disc levels: Multilevel degenerative spondylosis noted with disc space narrowing, sclerosis and endplate osteophytes spanning C2-C7 at all levels. Facet arthropathy most pronounced at C4-5 on the left. Upper chest: No acute finding.  Apical scarring. Other: None. IMPRESSION: Brain atrophy and chronic white matter microvascular changes. No acute intracranial abnormality by noncontrast CT Nonspecific left mastoid effusion Minor chronic sinus disease Cervical degenerative spondylosis and osteopenia without acute osseous finding, fracture or malalignment by CT. Electronically Signed  By: Eugenie Filler M.D.   On: 06/07/2017 11:35   Ct Cervical Spine Wo Contrast  Result Date: 06/07/2017 CLINICAL DATA:  Recent fall, trauma, high risk for injury EXAM: CT HEAD WITHOUT CONTRAST CT CERVICAL SPINE WITHOUT CONTRAST TECHNIQUE: Multidetector CT imaging of the head and cervical spine was performed following the standard protocol without intravenous contrast. Multiplanar CT image reconstructions of the cervical spine were also generated. COMPARISON:  None available FINDINGS: CT HEAD FINDINGS Brain: Brain atrophy evident with advanced chronic white matter microvascular ischemic changes throughout both cerebral hemispheres. No acute intracranial hemorrhage, mass lesion, definite new infarction, midline shift, herniation, hydrocephalus, or extra-axial fluid collection. No focal mass effect or edema. Cisterns are patent. Cerebellar atrophy as well. Vascular: Intracranial atherosclerosis noted.  No hyperdense vessel. Skull: No depressed skull fracture.  Left mastoid effusion noted. Sinuses/Orbits: Chronic scattered sinus mucosal thickening. Orbits are unremarkable and symmetric. Other: None. CT CERVICAL SPINE FINDINGS Alignment: Normal alignment. No subluxation or dislocation. Facets are aligned. Multilevel facet arthropathy  noted. Skull base and vertebrae: Bones are osteopenic. No acute osseous finding or fracture. Soft tissues and spinal canal: No prevertebral fluid or swelling. No visible canal hematoma. Carotid atherosclerosis evident. Disc levels: Multilevel degenerative spondylosis noted with disc space narrowing, sclerosis and endplate osteophytes spanning C2-C7 at all levels. Facet arthropathy most pronounced at C4-5 on the left. Upper chest: No acute finding.  Apical scarring. Other: None. IMPRESSION: Brain atrophy and chronic white matter microvascular changes. No acute intracranial abnormality by noncontrast CT Nonspecific left mastoid effusion Minor chronic sinus disease Cervical degenerative spondylosis and osteopenia without acute osseous finding, fracture or malalignment by CT. Electronically Signed   By: Jerilynn Mages.  Shick M.D.   On: 06/07/2017 11:35   Dg Hip Unilat  With Pelvis 2-3 Views Right  Result Date: 06/07/2017 CLINICAL DATA:  Followup getting out of bed this morning. Right hip pain. Initial encounter. EXAM: DG HIP (WITH OR WITHOUT PELVIS) 2-3V RIGHT COMPARISON:  None. FINDINGS: Comminuted intertrochanteric fracture of the right hip is seen. No evidence of dislocation. No pelvic fracture identified. Generalized osteopenia noted. Bilateral pelvic surgical clips are seen from previous lymphadenectomy. Peripheral vascular calcification noted in right thigh. IMPRESSION: Comminuted intertrochanteric right hip fracture. Electronically Signed   By: Earle Gell M.D.   On: 06/07/2017 11:43    Procedures Procedures (including critical care time)  Medications Ordered in ED Medications  fentaNYL (SUBLIMAZE) injection 50 mcg (50 mcg Intravenous Given 06/07/17 1142)  morphine 4 MG/ML injection 2 mg (2 mg Intravenous Given 06/07/17 1306)  furosemide (LASIX) injection 80 mg (80 mg Intravenous Given 06/07/17 1446)     Initial Impression / Assessment and Plan / ED Course  I have reviewed the triage vital signs and the  nursing notes.  Pertinent labs & imaging results that were available during my care of the patient were reviewed by me and considered in my medical decision making (see chart for details).     Patient with right intertrochanteric hip fracture.  CBC shows hemoglobin 9, CMP shows consistent BUN and creatinine, 76 and 3.73 respectively.  UA is negative for infection.  Chest x-ray shows new, diffuse nodular pulmonary, which is nonspecific; concerning for pulmonary edema versus viral pneumonitis versus metastatic disease.  I made the patient aware of this as well as admitting team who wishes to work this up prior to surgery.  Orthopedic Dr. Doran Durand will follow case and operate according to workup.  BNP is pending.  CT head and C-spine negative after fall.  Patient also evaluated by Dr. Tomi Bamberger who guided the patient's management and agrees with plan.  Final Clinical Impressions(s) / ED Diagnoses   Final diagnoses:  Closed fracture of right hip, initial encounter [facility])    New Prescriptions New Prescriptions   No medications on file     Caryl Ada 06/07/17 1517    Dorie Rank, MD 06/08/17 1120

## 2017-06-07 NOTE — ED Triage Notes (Signed)
Received pt from home via EMS with c/o fell out of bed about 2 hours PTA. Pt presents with deformity to right hip that is shortened and rotated out.

## 2017-06-07 NOTE — Anesthesia Procedure Notes (Signed)
Procedure Name: LMA Insertion Date/Time: 06/07/2017 4:36 PM Performed by: Suzy Bouchard Pre-anesthesia Checklist: Patient being monitored, Timeout performed, Suction available, Emergency Drugs available and Patient identified Patient Re-evaluated:Patient Re-evaluated prior to induction Oxygen Delivery Method: Circle system utilized Preoxygenation: Pre-oxygenation with 100% oxygen Induction Type: IV induction Ventilation: Mask ventilation without difficulty LMA: LMA inserted LMA Size: 4.0 Number of attempts: 1 Placement Confirmation: breath sounds checked- equal and bilateral and positive ETCO2 Tube secured with: Tape Dental Injury: Teeth and Oropharynx as per pre-operative assessment

## 2017-06-07 NOTE — Anesthesia Postprocedure Evaluation (Signed)
Anesthesia Post Note  Patient: Johnny Morrow  Procedure(s) Performed: INTRAMEDULLARY (IM) NAIL INTERTROCHANTRIC (Right Leg Upper)     Patient location during evaluation: PACU Anesthesia Type: General Level of consciousness: awake and alert Pain management: pain level controlled Vital Signs Assessment: post-procedure vital signs reviewed and stable Respiratory status: spontaneous breathing, nonlabored ventilation, respiratory function stable and patient connected to nasal cannula oxygen Cardiovascular status: blood pressure returned to baseline and stable Postop Assessment: no apparent nausea or vomiting Anesthetic complications: no    Last Vitals:  Vitals:   06/07/17 1825 06/07/17 1833  BP: (!) 133/59 138/74  Pulse: 61 (!) 57  Resp: 17 15  Temp: (!) 36.3 C   SpO2: 97% 95%    Last Pain:  Vitals:   06/07/17 1835  TempSrc:   PainSc: 0-No pain                 Audry Pili

## 2017-06-07 NOTE — Discharge Instructions (Addendum)
Add Renal Diet     [name], MD [place]  Please read the following information regarding your care after surgery.  Narcotic pain medicine (ex. oxycodone, Percocet, Vicodin) will cause constipation.  To prevent this problem, take the following medicines while you are taking any pain medicine. X docusate sodium (Colace) 100 mg twice a day X senna (Senokot) 2 tablets twice a day  X To help prevent blood clots, take a baby aspirin (81 mg) twice a day for two weeks after surgery.  You should also get up every hour while you are awake to move around.    Weight Bearing X Bear weight when you are able on your operated leg or foot. ? Bear weight only on your operated foot in the post-op shoe. ? Do not bear any weight on the operated leg or foot.  Cast / Splint / Dressing ? Keep your splint, cast or dressing clean and dry.  Dont put anything (coat hanger, pencil, etc) down inside of it.  If it gets damp, use a hair dryer on the cool setting to dry it.  If it gets soaked, call the office to schedule an appointment for a cast change. X Remove your dressing 3 days after surgery and cover the incisions with dry dressings.    After your dressing, cast or splint is removed; you may shower, but do not soak or scrub the wound.  Allow the water to run over it, and then gently pat it dry.  Swelling It is normal for you to have swelling where you had surgery.  To reduce swelling and pain, keep your toes above your nose for at least 3 days after surgery.  It may be necessary to keep your foot or leg elevated for several weeks.  If it hurts, it should be elevated.  Follow Up Call my office at [phone] when you are discharged from the hospital or surgery center to schedule an appointment to be seen two weeks after surgery.  Call my office at [phone] if you develop a fever >101.5 F, nausea, vomiting, bleeding from the surgical site or severe pain.

## 2017-06-07 NOTE — Consult Note (Signed)
Cardiology Consultation:   Patient ID: Johnny Morrow; [id number]; 1920-01-18   Admit date: 06/07/2017 Date of Consult: 06/07/2017  Primary Care Provider: Martinique, Betty G, MD Primary Cardiologist: [facility]    Patient Profile:   Johnny Morrow is a 83 y.o. male with a hx of atrial flutter, moderate AS, HFpEF and HTN, followed by [facility] as well as h/o prostate CA and CKD, who is being seen today for pre-operative evaluation for hip surgery, at the request of Dr. Loleta Books, Internal Medicine.  History of Present Illness:   As outlined above, pt is followed by [facility] Cardiology. Per Care Everywhere, he has h/o atrial flutter. He was admitted to [facility] in April of this year for atrial flutter w/ RVR. He was started on labetalol which led to significant bradycardia. Carvedilol was also tried. This also caused significant bradycardia. He was also initiated on heparin and developed a significant RP bleed. During this admission he developed a significant aspiration event which led to a PEA arrest. He was successfully resuscitated and require multiple blood transfusions that admission.  Additional cardiac history includes mild-moderate AS and HFpEF. Most recent echo 03/24/17 showed normal LVEF at 55%, tri leaflet AV with a mean gradient of 21 mmHg. There is no documented h/o CAD.   Pt presented to the [facility] with complaint of mechanical fall with subsequent right hip pain. Imaging confirmed right hip intertrochanteric fracture. Recommendations are for surgery for IM nailing, if surgically cleared.   he has had hospitalizations for heart failure over the summer; his respiratory status at present is at baseline. He has had no edema. He has had no chest pain.  His fall today was related to getting tangled up and is not sure. There was no loss of consciousness of which he is aware.   Past Medical History:  Diagnosis Date  . Arthritis   . Cancer [facility])    Prostate cancer, follows with hematologists q  6 months. S/p radical prostatectomy and proton therapy in 2001.  . Cataract   . CKD (chronic kidney disease)    Follows with nephrologists.  . Hypertension   . Radiation cystitis   . Retinal hemorrhage of both eyes    follows with ophthalmologists.    Past Surgical History:  Procedure Laterality Date  . HERNIA REPAIR       Home Medications:  Prior to Admission medications   Medication Sig Start Date End Date Taking? Authorizing Provider  albuterol (PROVENTIL HFA;VENTOLIN HFA) 108 (90 Base) MCG/ACT inhaler Inhale 2 puffs into the lungs every 6 (six) hours as needed for wheezing or shortness of breath. 08/20/16  Yes Martinique, Betty G, MD  amLODipine (NORVASC) 10 MG tablet Take 1 tablet (10 mg total) by mouth daily. 03/09/17  Yes Martinique, Betty G, MD  B Complex Vitamins (VITAMIN-B COMPLEX PO) Take 1 tablet by mouth daily.    Yes [provider]  Bacillus Coagulans-Inulin (PROBIOTIC FORMULA) 1-250 BILLION-MG CAPS Take 250 mg by mouth daily.   Yes [provider]  Cholecalciferol (VITAMIN D3) 5000 units TABS Take 5,000 Units by mouth daily.    Yes [provider]  Coenzyme Q10 (CO Q 10) 100 MG CAPS Take 100 mg by mouth daily.   Yes [provider]  doxazosin (CARDURA) 1 MG tablet Take 1 mg by mouth every evening.   Yes [provider]  Elastic Bandages & Supports (LUMBAR BACK BRACE/SUPPORT PAD) MISC Use as directed for lumbar scoliosis 09/25/15  Yes Burns, Claudina Lick, MD  ferrous sulfate 325 (65 FE) MG tablet Take 1 tablet (325 mg total) by mouth daily with breakfast. 12/22/16  Yes Martinique, Betty G, MD  furosemide (LASIX) 40 MG tablet Take 40 mg by mouth daily.   Yes [provider]  hydrALAZINE (APRESOLINE) 50 MG tablet Take 1 tablet (50 mg total) by mouth 3 (three) times daily. 12/22/16  Yes Martinique, Betty G, MD  isosorbide mononitrate (IMDUR) 60 MG 24 hr tablet Take 60 mg by mouth daily.   Yes [provider]  Multiple Vitamin (MULTI VITAMIN  PO) Take 1 tablet by mouth daily.    Yes [provider]  Omega 3 1000 MG CAPS Take 2,000 mg by mouth daily.   Yes [provider]    Inpatient Medications: Scheduled Meds:  Continuous Infusions:  PRN Meds:   Allergies:    Allergies  Allergen Reactions  . Iodinated Diagnostic Agents Rash    Other Reaction: Allergy  . Penicillins Rash    Social History:   Social History   Social History  . Marital status: Married    Spouse name: N/A  . Number of children: N/A  . Years of education: N/A   Occupational History  . Not on file.   Social History Main Topics  . Smoking status: Former Smoker    Types: Cigarettes  . Smokeless tobacco: Never Used  . Alcohol use Yes  . Drug use: No  . Sexual activity: Not on file   Other Topics Concern  . Not on file   Social History Narrative  . No narrative on file    Family History:    Family History  Problem Relation Age of Onset  . Cancer Father   . Heart attack Neg Hx      ROS:  Please see the history of present illness.  Review of Systems  Musculoskeletal: Arthritis: .skpe.    All other ROS reviewed and negative.     Physical Exam/Data:   Vitals:   06/07/17 1135 06/07/17 1200 06/07/17 1230 06/07/17 1300  BP:  137/70 (!) 151/77 (!) 141/66  Pulse: 66 60 65 76  Resp:    18  Temp:      TempSrc:      SpO2: 96% 95% 97% 98%  Weight:      Height:       No intake or output data in the 24 hours ending 06/07/17 1329 Filed Weights   06/07/17 1008  Weight: 124 lb (56.2 kg)   Body mass index is 21.28 kg/m.  Alert and oriented in no acute distress HENT- normal Eyes- EOMI, without scleral icterus Skin- warm and dry; without rashes LN-neg Neck- supple without thyromegaly, JVP-flat, carotids delayed with transimitted murmur Back-without CVAT or kyphosis Lungs-clear to auscultation CV-Regular rate and rhythm, nl S1 and single S2 3/6 systolic mur Abd-soft with active bowel sounds; no midline  pulsation or hepatomegaly Pulses-intact femoral and distal MKS-ext rotation  Neuro- Ax O, CN3-12 intact, grossly normal motor and sensory function Affect engaging  Point sensory and motor function   EKG:  The EKG was personally reviewed and demonstrates:  12 lead pending  Telemetry:  Telemetry was personally reviewed and demonstrates:  Sinus  Relevant CV Studies: 2D Echo 03/2017 LV Ejection Fraction (%) 55 %  Right Ventricle Systolic Pressure (mmHg) 71 mmHg  Left Atrium Diameter (cm) 4.4 cm  LV End Diastolic Diameter (cm) 4.1 cm  LV End Systolic Diameter (cm) 3.0 cm  LV Septum Wall Thickness (cm) 1.5 cm  LV Posterior Wall Thickness (cm) 1.5 cm  Tricuspid Valve Regurgitation Grade moderate   Tricuspid Valve Regurgitation Max Velocity (m/s) 4.0 m/s  Mitral Valve Regurgitation Grade mild   Mitral Valve Stenosis Grade none   Aortic Valve Regurgitation Grade mild   Aortic Valve Stenosis Grade moderate   Aortic Valve Stenosis Mean Gradient (mmHg) 21 mmHg  Aortic Valve Max Velocity (m/s) 3.2 m/s    Laboratory Data:  Chemistry Recent Labs Lab 06/07/17 1022  NA 134*  K 4.5  CL 107  CO2 17*  GLUCOSE 133*  BUN 76*  CREATININE 3.73*  CALCIUM 8.8*  GFRNONAA 13*  GFRAA 15*  ANIONGAP 10     Recent Labs Lab 06/07/17 1022  PROT 6.6  ALBUMIN 3.3*  AST 30  ALT 18  ALKPHOS 62  BILITOT 0.9   Hematology Recent Labs Lab 06/07/17 1022  WBC 7.2  RBC 3.19*  HGB 9.0*  HCT 25.6*  MCV 80.3  MCH 28.2  MCHC 35.2  RDW 13.7  PLT 148*   Cardiac EnzymesNo results for input(s): TROPONINI in the last 168 hours. No results for input(s): TROPIPOC in the last 168 hours.  BNPNo results for input(s): BNP, PROBNP in the last 168 hours.  DDimer No results for input(s): DDIMER in the last 168 hours.  Radiology/Studies:  Dg Chest 1 View  Result Date: 06/07/2017 CLINICAL DATA:  Acute right hip fracture. Pre-op respiratory exam. Prostate carcinoma. EXAM: CHEST 1 VIEW COMPARISON:   09/30/2016 FINDINGS: Heart size is stable.  Severe thoracolumbar dextroscoliosis. Diffuse nodular pulmonary interstitial prominence is new since previous study. This is nonspecific, and may be due to pulmonary edema, viral pneumonitis, or possibly metastatic disease. No evidence of pneumothorax or pleural effusion. IMPRESSION: New diffuse nodular pulmonary interstitial prominence, which is nonspecific. Differential considerations include pulmonary edema, viral pneumonitis, or possibly metastatic disease. Recommend clinical correlation, and consider continued chest radiographic followup versus chest CT. Electronically Signed   By: Earle Gell M.D.   On: 06/07/2017 11:49   Ct Head Wo Contrast  Result Date: 06/07/2017 CLINICAL DATA:  Recent fall, trauma, high risk for injury EXAM: CT HEAD WITHOUT CONTRAST CT CERVICAL SPINE WITHOUT CONTRAST TECHNIQUE: Multidetector CT imaging of the head and cervical spine was performed following the standard protocol without intravenous contrast. Multiplanar CT image reconstructions of the cervical spine were also generated. COMPARISON:  None available FINDINGS: CT HEAD FINDINGS Brain: Brain atrophy evident with advanced chronic white matter microvascular ischemic changes throughout both cerebral hemispheres. No acute intracranial hemorrhage, mass lesion, definite new infarction, midline shift, herniation, hydrocephalus, or extra-axial fluid collection. No focal mass effect or edema. Cisterns are patent. Cerebellar atrophy as well. Vascular: Intracranial atherosclerosis noted.  No hyperdense vessel. Skull: No depressed skull fracture.  Left mastoid effusion noted. Sinuses/Orbits: Chronic scattered sinus mucosal thickening. Orbits are unremarkable and symmetric. Other: None. CT CERVICAL SPINE FINDINGS Alignment: Normal alignment. No subluxation or dislocation. Facets are aligned. Multilevel facet arthropathy noted. Skull base and vertebrae: Bones are osteopenic. No acute osseous  finding or fracture. Soft tissues and spinal canal: No prevertebral fluid or swelling. No visible canal hematoma. Carotid atherosclerosis evident. Disc levels: Multilevel degenerative spondylosis noted with disc space narrowing, sclerosis and endplate osteophytes spanning C2-C7 at all levels. Facet arthropathy most pronounced at C4-5 on the left. Upper chest: No acute finding.  Apical scarring. Other: None. IMPRESSION: Brain atrophy and chronic white matter microvascular changes. No acute intracranial abnormality by noncontrast CT Nonspecific left mastoid effusion Minor chronic sinus disease Cervical degenerative  spondylosis and osteopenia without acute osseous finding, fracture or malalignment by CT. Electronically Signed   By: Jerilynn Mages.  Shick M.D.   On: 06/07/2017 11:35   Ct Cervical Spine Wo Contrast  Result Date: 06/07/2017 CLINICAL DATA:  Recent fall, trauma, high risk for injury EXAM: CT HEAD WITHOUT CONTRAST CT CERVICAL SPINE WITHOUT CONTRAST TECHNIQUE: Multidetector CT imaging of the head and cervical spine was performed following the standard protocol without intravenous contrast. Multiplanar CT image reconstructions of the cervical spine were also generated. COMPARISON:  None available FINDINGS: CT HEAD FINDINGS Brain: Brain atrophy evident with advanced chronic white matter microvascular ischemic changes throughout both cerebral hemispheres. No acute intracranial hemorrhage, mass lesion, definite new infarction, midline shift, herniation, hydrocephalus, or extra-axial fluid collection. No focal mass effect or edema. Cisterns are patent. Cerebellar atrophy as well. Vascular: Intracranial atherosclerosis noted.  No hyperdense vessel. Skull: No depressed skull fracture.  Left mastoid effusion noted. Sinuses/Orbits: Chronic scattered sinus mucosal thickening. Orbits are unremarkable and symmetric. Other: None. CT CERVICAL SPINE FINDINGS Alignment: Normal alignment. No subluxation or dislocation. Facets are  aligned. Multilevel facet arthropathy noted. Skull base and vertebrae: Bones are osteopenic. No acute osseous finding or fracture. Soft tissues and spinal canal: No prevertebral fluid or swelling. No visible canal hematoma. Carotid atherosclerosis evident. Disc levels: Multilevel degenerative spondylosis noted with disc space narrowing, sclerosis and endplate osteophytes spanning C2-C7 at all levels. Facet arthropathy most pronounced at C4-5 on the left. Upper chest: No acute finding.  Apical scarring. Other: None. IMPRESSION: Brain atrophy and chronic white matter microvascular changes. No acute intracranial abnormality by noncontrast CT Nonspecific left mastoid effusion Minor chronic sinus disease Cervical degenerative spondylosis and osteopenia without acute osseous finding, fracture or malalignment by CT. Electronically Signed   By: Jerilynn Mages.  Shick M.D.   On: 06/07/2017 11:35   Dg Hip Unilat  With Pelvis 2-3 Views Right  Result Date: 06/07/2017 CLINICAL DATA:  Followup getting out of bed this morning. Right hip pain. Initial encounter. EXAM: DG HIP (WITH OR WITHOUT PELVIS) 2-3V RIGHT COMPARISON:  None. FINDINGS: Comminuted intertrochanteric fracture of the right hip is seen. No evidence of dislocation. No pelvic fracture identified. Generalized osteopenia noted. Bilateral pelvic surgical clips are seen from previous lymphadenectomy. Peripheral vascular calcification noted in right thigh. IMPRESSION: Comminuted intertrochanteric right hip fracture. Electronically Signed   By: Earle Gell M.D.   On: 06/07/2017 11:43    Assessment and Plan:   Hip fracture  HFpEF  Chronic compensated  AS mild/mod  Renal Insuff Gd 4  Anemia  The patient is relatively well compensated given his chronic cardiacand renal conditions. I don't think there is any way that we can get him better than he is intense, would recommend to proceed with surgery   we will be available to followAfterwards.  I have advised him and his  wife that while his surgical risks are certainly increased given his multiple morbidities, but there was not likely going to improve with more time.  Postoperative attention towards hemoglobin fluid status and renal function will be important  We will be available as needed      For questions or updates, please contact [place] Please consult [email or web address] for contact info under Cardiology/STEMI.   Signed, Lyda Jester, PA-C  06/07/2017 1:29 PM  Note as above has been addended to include thoughts, history and physical examination findings

## 2017-06-07 NOTE — Transfer of Care (Signed)
Immediate Anesthesia Transfer of Care Note  Patient: Johnny Morrow  Procedure(s) Performed: INTRAMEDULLARY (IM) NAIL INTERTROCHANTRIC (Right Leg Upper)  Patient Location: PACU  Anesthesia Type:General  Level of Consciousness: sedated  Airway & Oxygen Therapy: Patient Spontanous Breathing and Patient connected to nasal cannula oxygen  Post-op Assessment: Report given to RN and Post -op Vital signs reviewed and stable  Post vital signs: Reviewed and stable  Last Vitals:  Vitals:   06/07/17 1415 06/07/17 1445  BP:  (!) 146/70  Pulse: 68 69  Resp: (!) 22 12  Temp:    SpO2: 97% 97%    Last Pain:  Vitals:   06/07/17 1345  TempSrc:   PainSc: 1          Complications: No apparent anesthesia complications

## 2017-06-07 NOTE — H&P (Signed)
History and Physical  Patient Name: Johnny Morrow     KDX:[id number]    DOB: [birth year]    DOA: 06/07/2017 Referring provider: Dr. Doran Durand PCP: Martinique, Betty G, MD   Patient coming from: [name]     Chief Complaint: Hip pain and fall  HPI: Johnny Morrow is a 83 y.o. male with a past medical history significant for CKD IV baseline Cr 3.8-4.4, history of prostate cancer s/p resection, HTN, HFpEF, moderate AS, pAF not on AC due to recent large retroperitoneal bleed who presents with hip pain after a fall.  The patient was in his normal state of health until this morning, he was changing, lost his balance, fell and had immediate right hip pain and could not walk.  There was no preceding dizziness, weakness, lightheadedness or vertigo, no chest discomfort, palpitations, or dyspnea, nor are there any of those symptoms now.  ED course: -Afebrile, heart rate 60s, respiraitons and pulse ox normal, BP 150/65 -Na 134, K 4.5, Cr 3.73, Bicarb 17 -WHBC 7.3K, Hgb 9 and normocytic -CT head and cspine negative -Plain radiograph of the pelvis showed a comminuted right hip fracture.  The case was discussed with Dr. Doran Durand from Orthopedics who agreed to see the patient, and TRH were asked to admit for medical management -CXR subsequently showed bilateral opacities    The patient was recently admitted to [facility] for CHF flare.  During that hospitalization, he was diuresed to a dry weight of 108 lbs.  Since then, twice he has required outpatient increase in his Lasix for weights in the 120s, which is what he thinks his weight is back up to today (although he states this is because he is eating more to gain weight).     Review of Systems:  Review of Systems  Constitutional: Negative for chills and fever.  Respiratory: Negative for cough, sputum production and shortness of breath.   Cardiovascular: Negative for chest pain, palpitations, orthopnea and leg swelling.  Musculoskeletal: Positive for falls and joint  pain.  All other systems reviewed and are negative.         Past Medical History:  Diagnosis Date  . Anemia   . Arthritis   . Atrial fibrillation ([place])   . Cancer [facility])    Prostate cancer, follows with hematologists q 6 months. S/p radical prostatectomy and proton therapy in 2001.  . Cataract   . CHF (congestive heart failure) ([place])   . CKD (chronic kidney disease)    Follows with nephrologists.  . DVT (deep venous thrombosis) ([place])   . Hypertension   . Radiation cystitis   . Retinal hemorrhage of both eyes    follows with ophthalmologists.    Past Surgical History:  Procedure Laterality Date  . HERNIA REPAIR      Social History: Patient lives at [place] with his wife.  He walks with a cane only at baseline, often not even that, wtihin the home.  Patient is a retired Investment banker, operational.  He is a former smoker.   From [place].    Allergies  Allergen Reactions  . Iodinated Diagnostic Agents Rash    Other Reaction: Allergy  . Penicillins Rash    Family history: family history includes COPD in his brother; Cancer in his father; Diabetes in his mother; Heart attack in his brother and father.  Prior to Admission medications   Medication Sig Start Date End Date Taking? Authorizing Provider  albuterol (PROVENTIL HFA;VENTOLIN HFA) 108 (90 Base) MCG/ACT inhaler Inhale 2 puffs into  the lungs every 6 (six) hours as needed for wheezing or shortness of breath. 08/20/16  Yes Martinique, Betty G, MD  amLODipine (NORVASC) 10 MG tablet Take 1 tablet (10 mg total) by mouth daily. 03/09/17  Yes Martinique, Betty G, MD  B Complex Vitamins (VITAMIN-B COMPLEX PO) Take 1 tablet by mouth daily.    Yes [provider]  Bacillus Coagulans-Inulin (PROBIOTIC FORMULA) 1-250 BILLION-MG CAPS Take 250 mg by mouth daily.   Yes [provider]  Cholecalciferol (VITAMIN D3) 5000 units TABS Take 5,000 Units by mouth daily.    Yes [provider]  Coenzyme Q10  (CO Q 10) 100 MG CAPS Take 100 mg by mouth daily.   Yes [provider]  doxazosin (CARDURA) 1 MG tablet Take 1 mg by mouth every evening.   Yes [provider]  Elastic Bandages & Supports (LUMBAR BACK BRACE/SUPPORT PAD) MISC Use as directed for lumbar scoliosis 09/25/15  Yes Burns, Claudina Lick, MD  ferrous sulfate 325 (65 FE) MG tablet Take 1 tablet (325 mg total) by mouth daily with breakfast. 12/22/16  Yes Martinique, Betty G, MD  furosemide (LASIX) 40 MG tablet Take 40 mg by mouth daily.   Yes [provider]  hydrALAZINE (APRESOLINE) 50 MG tablet Take 1 tablet (50 mg total) by mouth 3 (three) times daily. 12/22/16  Yes Martinique, Betty G, MD  isosorbide mononitrate (IMDUR) 60 MG 24 hr tablet Take 60 mg by mouth daily.   Yes [provider]  Multiple Vitamin (MULTI VITAMIN PO) Take 1 tablet by mouth daily.    Yes [provider]  Omega 3 1000 MG CAPS Take 2,000 mg by mouth daily.   Yes [provider]       Physical Exam: BP (!) 141/66   Pulse 76   Temp (!) 97.4 F (36.3 C) (Oral)   Resp 18   Ht 5\' 4"  (1.626 m)   Wt 56.2 kg (124 lb)   SpO2 98%   BMI 21.28 kg/m  General appearance: Well-developed, thin elderly adult male, alert and in no acute distress.   Eyes: Anicteric, conjunctiva pink, lids and lashes normal. PERRL.    ENT: No nasal deformity, discharge, epistaxis.  Hearing normal. OP moist without lesions.  Dentition with dentures.    Lymph: No cervical, supraclavicular or axillary lymphadenopathy. Skin: Warm and dry.  No jaundice.  No suspicious rashes or lesions. Cardiac: RRR, nl S1-S2, no murmurs appreciated.  Capillary refill is brisk.  JVP normal.  No LE edema.  Radial and DP pulses 2+ and symmetric.  No carotid bruits. Respiratory: Normal respiratory rate and rhythm.  CTAB without rales or wheezes. GI: Abdomen soft without rigidity.  No TTP. No ascites, distension, no hepatosplenomegaly.  MSK:  No effusions.  No clubbing or  cyanosis. Neuro:  Cranial nerves normal.  Sensorium intact and responding to questions, attention normal.  Speech is fluent.   Psych: The patient is oriented to time, place and person. Behavior appropriate.  Affect normal.  Recall, recent and remote, as well as general fund of knowledge seem within normal limits. No evidence of aural or visual hallucinations or delusions.     Labs on Admission:  I have personally reviewed following labs and imaging studies: CBC:  Recent Labs Lab 06/07/17 1022  WBC 7.2  NEUTROABS 5.4  HGB 9.0*  HCT 25.6*  MCV 80.3  PLT 008*   Basic Metabolic Panel:  Recent Labs Lab 06/07/17 1022  NA 134*  K 4.5  CL 107  CO2 17*  GLUCOSE 133*  BUN 76*  CREATININE 3.73*  CALCIUM 8.8*   GFR: Estimated Creatinine Clearance: 9.6 mL/min (A) (by C-G formula based on SCr of 3.73 mg/dL (H)).  Liver Function Tests:  Recent Labs Lab 06/07/17 1022  AST 30  ALT 18  ALKPHOS 62  BILITOT 0.9  PROT 6.6  ALBUMIN 3.3*   No results for input(s): LIPASE, AMYLASE in the last 168 hours. No results for input(s): AMMONIA in the last 168 hours. Coagulation Profile: No results for input(s): INR, PROTIME in the last 168 hours. Cardiac Enzymes: No results for input(s): CKTOTAL, CKMB, CKMBINDEX, TROPONINI in the last 168 hours. BNP (last 3 results)  Recent Labs  08/20/16 1221 12/22/16 1453  PROBNP 373.0* 999.0*   HbA1C: No results for input(s): HGBA1C in the last 72 hours. CBG: No results for input(s): GLUCAP in the last 168 hours. Lipid Profile: No results for input(s): CHOL, HDL, LDLCALC, TRIG, CHOLHDL, LDLDIRECT in the last 72 hours. Thyroid Function Tests: No results for input(s): TSH, T4TOTAL, FREET4, T3FREE, THYROIDAB in the last 72 hours. Anemia Panel: No results for input(s): VITAMINB12, FOLATE, FERRITIN, TIBC, IRON, RETICCTPCT in the last 72 hours.    Radiological Exams on Admission: Personally reviewed CXR shows edema: Dg Chest 1  View  Result Date: 06/07/2017 CLINICAL DATA:  Acute right hip fracture. Pre-op respiratory exam. Prostate carcinoma. EXAM: CHEST 1 VIEW COMPARISON:  09/30/2016 FINDINGS: Heart size is stable.  Severe thoracolumbar dextroscoliosis. Diffuse nodular pulmonary interstitial prominence is new since previous study. This is nonspecific, and may be due to pulmonary edema, viral pneumonitis, or possibly metastatic disease. No evidence of pneumothorax or pleural effusion. IMPRESSION: New diffuse nodular pulmonary interstitial prominence, which is nonspecific. Differential considerations include pulmonary edema, viral pneumonitis, or possibly metastatic disease. Recommend clinical correlation, and consider continued chest radiographic followup versus chest CT. Electronically Signed   By: Earle Gell M.D.   On: 06/07/2017 11:49   Ct Head Wo Contrast  Result Date: 06/07/2017 CLINICAL DATA:  Recent fall, trauma, high risk for injury EXAM: CT HEAD WITHOUT CONTRAST CT CERVICAL SPINE WITHOUT CONTRAST TECHNIQUE: Multidetector CT imaging of the head and cervical spine was performed following the standard protocol without intravenous contrast. Multiplanar CT image reconstructions of the cervical spine were also generated. COMPARISON:  None available FINDINGS: CT HEAD FINDINGS Brain: Brain atrophy evident with advanced chronic white matter microvascular ischemic changes throughout both cerebral hemispheres. No acute intracranial hemorrhage, mass lesion, definite new infarction, midline shift, herniation, hydrocephalus, or extra-axial fluid collection. No focal mass effect or edema. Cisterns are patent. Cerebellar atrophy as well. Vascular: Intracranial atherosclerosis noted.  No hyperdense vessel. Skull: No depressed skull fracture.  Left mastoid effusion noted. Sinuses/Orbits: Chronic scattered sinus mucosal thickening. Orbits are unremarkable and symmetric. Other: None. CT CERVICAL SPINE FINDINGS Alignment: Normal alignment. No  subluxation or dislocation. Facets are aligned. Multilevel facet arthropathy noted. Skull base and vertebrae: Bones are osteopenic. No acute osseous finding or fracture. Soft tissues and spinal canal: No prevertebral fluid or swelling. No visible canal hematoma. Carotid atherosclerosis evident. Disc levels: Multilevel degenerative spondylosis noted with disc space narrowing, sclerosis and endplate osteophytes spanning C2-C7 at all levels. Facet arthropathy most pronounced at C4-5 on the left. Upper chest: No acute finding.  Apical scarring. Other: None. IMPRESSION: Brain atrophy and chronic white matter microvascular changes. No acute intracranial abnormality by noncontrast CT Nonspecific left mastoid effusion Minor chronic sinus disease Cervical degenerative spondylosis and osteopenia without acute osseous finding,  fracture or malalignment by CT. Electronically Signed   By: Jerilynn Mages.  Shick M.D.   On: 06/07/2017 11:35   Ct Cervical Spine Wo Contrast  Result Date: 06/07/2017 CLINICAL DATA:  Recent fall, trauma, high risk for injury EXAM: CT HEAD WITHOUT CONTRAST CT CERVICAL SPINE WITHOUT CONTRAST TECHNIQUE: Multidetector CT imaging of the head and cervical spine was performed following the standard protocol without intravenous contrast. Multiplanar CT image reconstructions of the cervical spine were also generated. COMPARISON:  None available FINDINGS: CT HEAD FINDINGS Brain: Brain atrophy evident with advanced chronic white matter microvascular ischemic changes throughout both cerebral hemispheres. No acute intracranial hemorrhage, mass lesion, definite new infarction, midline shift, herniation, hydrocephalus, or extra-axial fluid collection. No focal mass effect or edema. Cisterns are patent. Cerebellar atrophy as well. Vascular: Intracranial atherosclerosis noted.  No hyperdense vessel. Skull: No depressed skull fracture.  Left mastoid effusion noted. Sinuses/Orbits: Chronic scattered sinus mucosal thickening.  Orbits are unremarkable and symmetric. Other: None. CT CERVICAL SPINE FINDINGS Alignment: Normal alignment. No subluxation or dislocation. Facets are aligned. Multilevel facet arthropathy noted. Skull base and vertebrae: Bones are osteopenic. No acute osseous finding or fracture. Soft tissues and spinal canal: No prevertebral fluid or swelling. No visible canal hematoma. Carotid atherosclerosis evident. Disc levels: Multilevel degenerative spondylosis noted with disc space narrowing, sclerosis and endplate osteophytes spanning C2-C7 at all levels. Facet arthropathy most pronounced at C4-5 on the left. Upper chest: No acute finding.  Apical scarring. Other: None. IMPRESSION: Brain atrophy and chronic white matter microvascular changes. No acute intracranial abnormality by noncontrast CT Nonspecific left mastoid effusion Minor chronic sinus disease Cervical degenerative spondylosis and osteopenia without acute osseous finding, fracture or malalignment by CT. Electronically Signed   By: Jerilynn Mages.  Shick M.D.   On: 06/07/2017 11:35   Dg Hip Unilat  With Pelvis 2-3 Views Right  Result Date: 06/07/2017 CLINICAL DATA:  Followup getting out of bed this morning. Right hip pain. Initial encounter. EXAM: DG HIP (WITH OR WITHOUT PELVIS) 2-3V RIGHT COMPARISON:  None. FINDINGS: Comminuted intertrochanteric fracture of the right hip is seen. No evidence of dislocation. No pelvic fracture identified. Generalized osteopenia noted. Bilateral pelvic surgical clips are seen from previous lymphadenectomy. Peripheral vascular calcification noted in right thigh. IMPRESSION: Comminuted intertrochanteric right hip fracture. Electronically Signed   By: Earle Gell M.D.   On: 06/07/2017 11:43        Assessment and Plan: 1. Hip fracture: The patient will be seen by Dr. Doran Durand, to evaluate for operative fixation of the RIGHT hip.   -Admit to med-surg bed -Hydrocodone-acetaminophen or morphine as tolerated for pain -Bed rest, apply  ice, document sedation and vitals per Hip fracture protocol -Nutrition consulted -Hold following meds:    Overall, the patient is at elevated risk for the planned surgery.  Patient has a RCRI score of 3 (active CHF, chronic CHF, Cr >2).   Gupta score 3.71% may actually underestimate his risk.  -Recommend diuresis and Cardiology consultation for optimization prior to surgery   Post-operative medical care: Per AAOS 2014 guidelines on hip fractures in the elderly: -Recommend osteoporosis screening after discharge if not done previously -Recommend vitamin D 800 IUand calcium 1200 mg supplementation after discharge     Principal Problem:   Closed intertrochanteric fracture of hip, right, initial encounter ([place]) Active Problems:   Essential hypertension, benign   History of DVT of lower extremity, left   Chronic kidney disease (CKD), stage IV (severe) ([facility])   Normocytic anemia   AF (paroxysmal atrial  fibrillation) ([place])   Acute on chronic diastolic CHF (congestive heart failure) ([place])  2. Acute on chronic diastolic CHF: EF 37% in August.  Moderate AS, progressed from 2017 but not critical per [facility].  He is functionally at his baseline. -Furosemide 80 mg IV once now, then resume home fursoemid -Daily weights -Strict I/Os -Daily BMP  3. CKD IV-V: Baseline Cr 4-4.4 in last two months.  Followed by Andree Elk.    4. Anemia, normocytic: From renal disease -Transfusion threshold 8 mg/dL.  5. Hyponatremia: Hypervolemic.  Mild.  6. Paroxysmal atrial fibrillation, flutter: CHADS2Vasc 5, not on AC given large recent RP bleed.  7. Aortic stenosis: Moderate.  8. Hypertension: Hypertensive on admission -Continue amlodipine, hydralazine -Continue Imdur -Continue doxazosin -Continue aspirin  9. Chronic popliteal vein thrombosis: Not on AC given recent RP bleed       DVT prophylaxis: SCDs  Diet: NPO Code Status: FULL CODE  Family Communication: Wife  at bedside, overnight plan discussed, CODE STATUS confirmed  Disposition Plan: Anticipate evaluation by Orthopedics and surgical fixation after Cardiology clearance, then PT evaluation and discharge to SNF in 2-3 days. Admission status: INPATIENT for hip fracture, medical surgical bed     Medical decision making and consults: Patient seen at 1:44 PM on 06/07/2017.  The patient was discussed with Dr. Caryl Comes and Dr. Doran Durand. What exists of the patient's chart was reviewed in depth and summarized above.  Clinical condition: stable.      Edwin Dada [facility] Pager [phone]

## 2017-06-07 NOTE — Progress Notes (Signed)
Patient arrived to [phone] after and IM nailing of Right hip.  Patient is alert but drowsy and states he has pain in right leg.  IV is in tact and infusing.  VSS.  No family present on arrival but waiting.  Will continue to monitor.

## 2017-06-07 NOTE — Op Note (Signed)
06/07/2017  5:53 PM  PATIENT:  Johnny Morrow  83 y.o. male  PRE-OPERATIVE DIAGNOSIS:  right intertrochanteric hip fracture  POST-OPERATIVE DIAGNOSIS:  right intertrochanteric hip fracture  Procedure(s):  Open treatment of right hip intertrochanteric fracture with intramedullary nailing  SURGEON:  [name], MD  ASSISTANT: n/a  ANESTHESIA:   General  EBL:  50 cc  TOURNIQUET:  n/a  COMPLICATIONS:  None apparent  DISPOSITION:  Extubated, awake and stable to recovery.  INDICATION FOR PROCEDURE: The patient is a 83 year old male with past medical history significant for heart failure and chronic kidney disease.  He fell this morning injuring his right hip.  He has an intertrochanteric fracture that is displaced.  He presents now for operative treatment of this displaced and unstable injury.  The risks and benefits of the alternative treatment options have been discussed in detail.  The patient wishes to proceed with surgery and specifically understands risks of bleeding, infection, nerve damage, blood clots, need for additional surgery, amputation and death.  PROCEDURE IN DETAIL:After pre operative consent was obtained, and the correct operative site was identified, the patient was brought to the operating room and placed supine on the OR table.  Anesthesia was administered.  Pre-operative antibiotics were administered.  A surgical timeout was taken.  The right lower extremity was immobilized in a traction boot.  The left lower extremity was positioned in a well leg holder.  A reduction maneuver was performed with abduction external rotation and traction followed by internal rotation and abduction.  AP and lateral radiographs showed appropriate reduction of the fracture.  The right lower externally was then prepped and draped in standard sterile fashion.  A longitudinal incision was made at the tip of the greater trochanter.  Dissection was carried down through the subtendinous tissues to the  gluteal fascia where it was incised.  Blunt dissection was then carried down to the tip of the greater trochanter.  A guidepin was inserted and advanced into the medullary canal under AP and lateral fluoroscopic guidance.  At the starter reamer was then advanced over the guidepin and open the medullary canal appropriately.  A 11 mm Biomet affixus nail was inserted into the medullary canal.  The targeting guide was then used to insert a guide pin into the femoral head and the inferior half of the neck centered on the lateral view.  After appropriate position was confirmed the guidepin was overdrilled and a 105 mm guide pin was inserted.  The compressor was used to compress the fracture site.  AP and lateral radiographs showed acceptable alignment at the fracture site.  The guide was then used to insert a distal interlock screw in percutaneous fashion.  AP and lateral radiographs of the right hip showed appropriate position and length of all hardware in appropriate reduction of the fracture.  Wounds were irrigated copiously.  Fascia was closed with Vicryl.  Subcu tendinous tissues were approximated with Monocryl.  Skin incisions were closed with nylon.  Sterile dressings were applied, and the patient was awakened from anesthesia.  Patient was transported to the recovery room in stable condition.  FOLLOW UP PLAN: Weightbearing as tolerated on the right lower extremity.  Physical therapy and occupational therapy consults.  Start Lovenox for DVT prophylaxis tomorrow at the renally adjusted dose.

## 2017-06-07 NOTE — Progress Notes (Signed)
Attempt to call report.

## 2017-06-07 NOTE — Progress Notes (Signed)
Orthopedic Tech Progress Note Patient Details:  Johnny Morrow 1920-01-19 [id number]  Patient ID: Johnny Morrow, male   DOB: 02-01-1920, 83 y.o.   MRN: [id number] Pt cant have ohf due to age restrictions   Karolee Stamps 06/07/2017, 7:18 PM

## 2017-06-08 ENCOUNTER — Encounter (HOSPITAL_COMMUNITY): Payer: Self-pay | Admitting: General Practice

## 2017-06-08 DIAGNOSIS — D649 Anemia, unspecified: Secondary | ICD-10-CM

## 2017-06-08 DIAGNOSIS — I350 Nonrheumatic aortic (valve) stenosis: Secondary | ICD-10-CM

## 2017-06-08 LAB — BASIC METABOLIC PANEL
ANION GAP: 9 (ref 5–15)
Anion gap: 10 (ref 5–15)
BUN: 81 mg/dL — AB (ref 6–20)
BUN: 85 mg/dL — ABNORMAL HIGH (ref 6–20)
CALCIUM: 8.3 mg/dL — AB (ref 8.9–10.3)
CALCIUM: 8.2 mg/dL — AB (ref 8.9–10.3)
CO2: 21 mmol/L — ABNORMAL LOW (ref 22–32)
CO2: 18 mmol/L — ABNORMAL LOW (ref 22–32)
Chloride: 106 mmol/L (ref 101–111)
Chloride: 106 mmol/L (ref 101–111)
Creatinine, Ser: 4.26 mg/dL — ABNORMAL HIGH (ref 0.61–1.24)
Creatinine, Ser: 4.85 mg/dL — ABNORMAL HIGH (ref 0.61–1.24)
GFR calc Af Amer: 12 mL/min — ABNORMAL LOW (ref >60)
GFR calc Af Amer: 11 mL/min — ABNORMAL LOW (ref >60)
GFR, EST NON AFRICAN AMERICAN: 11 mL/min — AB (ref >60)
GFR, EST NON AFRICAN AMERICAN: 9 mL/min — AB (ref >60)
GLUCOSE: 114 mg/dL — AB (ref 65–99)
Glucose, Bld: 132 mg/dL — ABNORMAL HIGH (ref 65–99)
POTASSIUM: 5.5 mmol/L — AB (ref 3.5–5.1)
POTASSIUM: 5.7 mmol/L — AB (ref 3.5–5.1)
SODIUM: 133 mmol/L — AB (ref 135–145)
Sodium: 137 mmol/L (ref 135–145)

## 2017-06-08 LAB — CBC
HCT: 18.6 % — ABNORMAL LOW (ref 39.0–52.0)
Hemoglobin: 6.6 g/dL — CL (ref 13.0–17.0)
MCH: 28.6 pg (ref 26.0–34.0)
MCHC: 35.5 g/dL (ref 30.0–36.0)
MCV: 80.5 fL (ref 78.0–100.0)
PLATELETS: 122 10*3/uL — AB (ref 150–400)
RBC: 2.31 MIL/uL — AB (ref 4.22–5.81)
RDW: 14 % (ref 11.5–15.5)
WBC: 5.3 10*3/uL (ref 4.0–10.5)

## 2017-06-08 LAB — PREPARE RBC (CROSSMATCH)

## 2017-06-08 LAB — HEMOGLOBIN AND HEMATOCRIT, BLOOD
HEMATOCRIT: 25.2 % — AB (ref 39.0–52.0)
HEMOGLOBIN: 8.8 g/dL — AB (ref 13.0–17.0)

## 2017-06-08 MED ORDER — SODIUM CHLORIDE 0.9 % IV SOLN: Freq: Once | INTRAVENOUS | Status: DC

## 2017-06-08 MED ORDER — SODIUM CHLORIDE 0.45 % IV SOLN
INTRAVENOUS | Status: DC
  Administered 2017-06-08: 11:00:00 via INTRAVENOUS

## 2017-06-08 MED ORDER — METHOCARBAMOL 1000 MG/10ML IJ SOLN
500.0000 mg | Freq: Three times a day (TID) | INTRAMUSCULAR | Status: DC | PRN
  Administered 2017-06-08 – 2017-06-10 (×4): 500 mg via INTRAVENOUS
  Filled 2017-06-08 (×5): qty 5

## 2017-06-08 MED ORDER — SODIUM CHLORIDE 0.9 % IV SOLN
INTRAVENOUS | Status: DC
  Administered 2017-06-09: via INTRAVENOUS

## 2017-06-08 MED ORDER — ASPIRIN EC 81 MG PO TBEC: 81.0000 mg | DELAYED_RELEASE_TABLET | Freq: Two times a day (BID) | ORAL | 0 refills | Status: AC

## 2017-06-08 MED ORDER — ENOXAPARIN SODIUM 30 MG/0.3ML ~~LOC~~ SOLN: 30.0000 mg | SUBCUTANEOUS | 0 refills | Status: DC

## 2017-06-08 MED ORDER — SODIUM CHLORIDE 0.9 % IV SOLN
1.0000 g | Freq: Once | INTRAVENOUS | Status: AC
  Administered 2017-06-09: 1 g via INTRAVENOUS
  Filled 2017-06-08: qty 10

## 2017-06-08 NOTE — Progress Notes (Signed)
PROGRESS NOTE    Johnny Morrow  EPP:[id number] DOB: 1920-08-06 DOA: 06/07/2017 PCP: [place], Betty G, MD    Brief Narrative:  Johnny Morrow is a 83 y.o. male with a past medical history significant for CKD IV baseline Cr 3.8-4.4, history of prostate cancer s/p resection, HTN, HFpEF, moderate AS, pAF not on AC due to recent large retroperitoneal bleed who presents with hip pain after a fall.  The patient was in his normal state of health until the morning of admission on 10/21, he was changing, lost his balance, fell and had immediate right hip pain and could not walk.  There was no preceding dizziness, weakness, lightheadedness or vertigo, no chest discomfort, palpitations, or dyspnea.   Assessment & Plan:   Principal Problem:   Closed intertrochanteric fracture of hip, right, initial encounter [facility]) Active Problems:   Essential hypertension, benign   History of DVT of lower extremity, left   Chronic kidney disease (CKD), stage IV (severe) (HCC)   Normocytic anemia   AF (paroxysmal atrial fibrillation) (HCC)   Acute on chronic diastolic CHF (congestive heart failure) (HCC)  Hip Fracture: POD #1. Dr Doran Durand took pt to OR on 10/21 for right hip intertrochanteric fracture with intramedullary nailing.  - Ortho - PT/OT - anticipate CIR admission  Acute on chronic diastolic CHF: mild at time of admission. Given single dose of lasix 80mg  IV then put back on home dose. +900cc since admission - doubt accurate measurements.  - Continue Lasix  -Daily weights -Strict I/Os -Daily BMP  CKD IV-V: Cr 4.36. Baseline Cr 4-4.4. Followed by [facility].  - BMP in am  Acute on chronic anemia: secondary for acute surgical blood loss and CKD IV. Given 1 unit PRBC overnight. W - 1 PRBC (total of 2) - CBC in pm and am - Consider Epo but will defer to outpt renal  PAF: CHA2DS2-VASc = 5. Not an anticoagulation candidate due to bleeding issues in past. curretnly rate controlled. -  tele  HTN: - continue  norvasc, imdur, hydralazine  Chronic DVT: popliteal veing thrombosis. Not an anticoagulation candidate.    DVT prophylaxis: lovenox Code Status: full Family Communication: wife Disposition Plan: pending improvement in overall condition and likely SNF/rehab admission   Consultants:   ortho  Procedures:  INTRAMEDULLARY (IM) NAIL INTERTROCHANTRIC (Right) 10/21  Antimicrobials:   Perioperative Ancef   Subjective: Feeling better. Pain well controlled. Robaxin improving muscle tension. Denies cough, cp, sob, palp, fever, ha, focal neuro deficits   Objective: Vitals:   06/08/17 0433 06/08/17 0645 06/08/17 0702 06/08/17 0904  BP: (!) 104/56 (!) 102/53 (!) 102/54 (!) 111/58  Pulse: 77 74 74 79  Resp: 16 16 16    Temp: 98.6 F (37 C) 98 F (36.7 C) 98 F (36.7 C)   TempSrc: Oral Oral    SpO2: 97% 98% 98%   Weight:      Height:        Intake/Output Summary (Last 24 hours) at 06/08/17 0924 Last data filed at 06/08/17 0839  Gross per 24 hour  Intake           1307.5 ml  Output              375 ml  Net            932.5 ml   Filed Weights   06/07/17 1008  Weight: 56.2 kg (124 lb)    Examination:  General exam: Appears calm and comfortable  Respiratory system: Clear to auscultation. Respiratory effort normal.  Cardiovascular system: S1 & S2 heard, RRR. No JVD, murmurs, rubs, gallops or clicks. No pedal edema. Gastrointestinal system: Abdomen is nondistended, soft and nontender. No organomegaly or masses felt. Normal bowel sounds heard. Central nervous system: Alert and oriented. No focal neurological deficits. Extremities: UE symmetrical. R hip Symmetric 5 x 5 power. Skin: No rashes, lesions or ulcers Psychiatry: Judgement and insight appear normal. Mood & affect appropriate.     Data Reviewed: I have personally reviewed following labs and imaging studies  CBC:  Recent Labs Lab 06/07/17 1022 06/08/17 0413  WBC 7.2 5.3  NEUTROABS 5.4  --   HGB 9.0* 6.6*    HCT 25.6* 18.6*  MCV 80.3 80.5  PLT 148* 725*   Basic Metabolic Panel:  Recent Labs Lab 06/07/17 1022 06/08/17 0413  NA 134* 137  K 4.5 5.5*  CL 107 106  CO2 17* 21*  GLUCOSE 133* 132*  BUN 76* 81*  CREATININE 3.73* 4.26*  CALCIUM 8.8* 8.3*   GFR: Estimated Creatinine Clearance: 8.4 mL/min (A) (by C-G formula based on SCr of 4.26 mg/dL (H)). Liver Function Tests:  Recent Labs Lab 06/07/17 1022  AST 30  ALT 18  ALKPHOS 62  BILITOT 0.9  PROT 6.6  ALBUMIN 3.3*   No results for input(s): LIPASE, AMYLASE in the last 168 hours. No results for input(s): AMMONIA in the last 168 hours. Coagulation Profile: No results for input(s): INR, PROTIME in the last 168 hours. Cardiac Enzymes: No results for input(s): CKTOTAL, CKMB, CKMBINDEX, TROPONINI in the last 168 hours. BNP (last 3 results)  Recent Labs  08/20/16 1221 12/22/16 1453  PROBNP 373.0* 999.0*   HbA1C: No results for input(s): HGBA1C in the last 72 hours. CBG: No results for input(s): GLUCAP in the last 168 hours. Lipid Profile: No results for input(s): CHOL, HDL, LDLCALC, TRIG, CHOLHDL, LDLDIRECT in the last 72 hours. Thyroid Function Tests: No results for input(s): TSH, T4TOTAL, FREET4, T3FREE, THYROIDAB in the last 72 hours. Anemia Panel: No results for input(s): VITAMINB12, FOLATE, FERRITIN, TIBC, IRON, RETICCTPCT in the last 72 hours. Sepsis Labs: No results for input(s): PROCALCITON, LATICACIDVEN in the last 168 hours.  No results found for this or any previous visit (from the past 240 hour(s)).       Radiology Studies: Dg Chest 1 View  Result Date: 06/07/2017 CLINICAL DATA:  Acute right hip fracture. Pre-op respiratory exam. Prostate carcinoma. EXAM: CHEST 1 VIEW COMPARISON:  09/30/2016 FINDINGS: Heart size is stable.  Severe thoracolumbar dextroscoliosis. Diffuse nodular pulmonary interstitial prominence is new since previous study. This is nonspecific, and may be due to pulmonary edema,  viral pneumonitis, or possibly metastatic disease. No evidence of pneumothorax or pleural effusion. IMPRESSION: New diffuse nodular pulmonary interstitial prominence, which is nonspecific. Differential considerations include pulmonary edema, viral pneumonitis, or possibly metastatic disease. Recommend clinical correlation, and consider continued chest radiographic followup versus chest CT. Electronically Signed   By: Earle Gell M.D.   On: 06/07/2017 11:49   Ct Head Wo Contrast  Result Date: 06/07/2017 CLINICAL DATA:  Recent fall, trauma, high risk for injury EXAM: CT HEAD WITHOUT CONTRAST CT CERVICAL SPINE WITHOUT CONTRAST TECHNIQUE: Multidetector CT imaging of the head and cervical spine was performed following the standard protocol without intravenous contrast. Multiplanar CT image reconstructions of the cervical spine were also generated. COMPARISON:  None available FINDINGS: CT HEAD FINDINGS Brain: Brain atrophy evident with advanced chronic white matter microvascular ischemic changes throughout both cerebral hemispheres. No acute intracranial hemorrhage, mass lesion, definite  new infarction, midline shift, herniation, hydrocephalus, or extra-axial fluid collection. No focal mass effect or edema. Cisterns are patent. Cerebellar atrophy as well. Vascular: Intracranial atherosclerosis noted.  No hyperdense vessel. Skull: No depressed skull fracture.  Left mastoid effusion noted. Sinuses/Orbits: Chronic scattered sinus mucosal thickening. Orbits are unremarkable and symmetric. Other: None. CT CERVICAL SPINE FINDINGS Alignment: Normal alignment. No subluxation or dislocation. Facets are aligned. Multilevel facet arthropathy noted. Skull base and vertebrae: Bones are osteopenic. No acute osseous finding or fracture. Soft tissues and spinal canal: No prevertebral fluid or swelling. No visible canal hematoma. Carotid atherosclerosis evident. Disc levels: Multilevel degenerative spondylosis noted with disc space  narrowing, sclerosis and endplate osteophytes spanning C2-C7 at all levels. Facet arthropathy most pronounced at C4-5 on the left. Upper chest: No acute finding.  Apical scarring. Other: None. IMPRESSION: Brain atrophy and chronic white matter microvascular changes. No acute intracranial abnormality by noncontrast CT Nonspecific left mastoid effusion Minor chronic sinus disease Cervical degenerative spondylosis and osteopenia without acute osseous finding, fracture or malalignment by CT. Electronically Signed   By: Jerilynn Mages.  Shick M.D.   On: 06/07/2017 11:35   Ct Cervical Spine Wo Contrast  Result Date: 06/07/2017 CLINICAL DATA:  Recent fall, trauma, high risk for injury EXAM: CT HEAD WITHOUT CONTRAST CT CERVICAL SPINE WITHOUT CONTRAST TECHNIQUE: Multidetector CT imaging of the head and cervical spine was performed following the standard protocol without intravenous contrast. Multiplanar CT image reconstructions of the cervical spine were also generated. COMPARISON:  None available FINDINGS: CT HEAD FINDINGS Brain: Brain atrophy evident with advanced chronic white matter microvascular ischemic changes throughout both cerebral hemispheres. No acute intracranial hemorrhage, mass lesion, definite new infarction, midline shift, herniation, hydrocephalus, or extra-axial fluid collection. No focal mass effect or edema. Cisterns are patent. Cerebellar atrophy as well. Vascular: Intracranial atherosclerosis noted.  No hyperdense vessel. Skull: No depressed skull fracture.  Left mastoid effusion noted. Sinuses/Orbits: Chronic scattered sinus mucosal thickening. Orbits are unremarkable and symmetric. Other: None. CT CERVICAL SPINE FINDINGS Alignment: Normal alignment. No subluxation or dislocation. Facets are aligned. Multilevel facet arthropathy noted. Skull base and vertebrae: Bones are osteopenic. No acute osseous finding or fracture. Soft tissues and spinal canal: No prevertebral fluid or swelling. No visible canal  hematoma. Carotid atherosclerosis evident. Disc levels: Multilevel degenerative spondylosis noted with disc space narrowing, sclerosis and endplate osteophytes spanning C2-C7 at all levels. Facet arthropathy most pronounced at C4-5 on the left. Upper chest: No acute finding.  Apical scarring. Other: None. IMPRESSION: Brain atrophy and chronic white matter microvascular changes. No acute intracranial abnormality by noncontrast CT Nonspecific left mastoid effusion Minor chronic sinus disease Cervical degenerative spondylosis and osteopenia without acute osseous finding, fracture or malalignment by CT. Electronically Signed   By: Jerilynn Mages.  Shick M.D.   On: 06/07/2017 11:35   Dg C-arm 1-60 Min  Result Date: 06/07/2017 CLINICAL DATA:  Right hip fracture fixation EXAM: DG C-ARM 61-120 MIN; OPERATIVE RIGHT HIP WITH PELVIS COMPARISON:  None. FINDINGS: There are 4 C-arm fluoroscopic views provided from a right femoral nail fixation of a comminuted intertrochanteric fracture of the proximal femur with avulsed lesser trochanter. Less varus angulation is currently identified on these images. No evidence of hardware failure or immediate postoperative fracture. Improved alignment is demonstrated though there is still some cephalad displacement of the femoral shaft relative to the femoral neck. IMPRESSION: Status post nail fixation of intertrochanteric comminuted fracture the right femur. Electronically Signed   By: Ashley Royalty M.D.   On: 06/07/2017 23:13  Dg Hip Operative Unilat W Or W/o Pelvis Right  Result Date: 06/07/2017 CLINICAL DATA:  Right hip fracture fixation EXAM: DG C-ARM 61-120 MIN; OPERATIVE RIGHT HIP WITH PELVIS COMPARISON:  None. FINDINGS: There are 4 C-arm fluoroscopic views provided from a right femoral nail fixation of a comminuted intertrochanteric fracture of the proximal femur with avulsed lesser trochanter. Less varus angulation is currently identified on these images. No evidence of hardware failure or  immediate postoperative fracture. Improved alignment is demonstrated though there is still some cephalad displacement of the femoral shaft relative to the femoral neck. IMPRESSION: Status post nail fixation of intertrochanteric comminuted fracture the right femur. Electronically Signed   By: Ashley Royalty M.D.   On: 06/07/2017 23:13   Dg Hip Unilat  With Pelvis 2-3 Views Right  Result Date: 06/07/2017 CLINICAL DATA:  Followup getting out of bed this morning. Right hip pain. Initial encounter. EXAM: DG HIP (WITH OR WITHOUT PELVIS) 2-3V RIGHT COMPARISON:  None. FINDINGS: Comminuted intertrochanteric fracture of the right hip is seen. No evidence of dislocation. No pelvic fracture identified. Generalized osteopenia noted. Bilateral pelvic surgical clips are seen from previous lymphadenectomy. Peripheral vascular calcification noted in right thigh. IMPRESSION: Comminuted intertrochanteric right hip fracture. Electronically Signed   By: Earle Gell M.D.   On: 06/07/2017 11:43        Scheduled Meds: . amLODipine  10 mg Oral Daily  . docusate sodium  100 mg Oral BID  . doxazosin  1 mg Oral QPM  . enoxaparin (LOVENOX) injection  30 mg Subcutaneous Q24H  . ferrous sulfate  325 mg Oral Q breakfast  . furosemide  40 mg Oral Daily  . hydrALAZINE  50 mg Oral TID  . isosorbide mononitrate  60 mg Oral Daily  . potassium chloride  40 mEq Oral BID  . senna  1 tablet Oral BID   Continuous Infusions: . sodium chloride    . lactated ringers 1 mL (06/07/17 2039)  . methocarbamol (ROBAXIN)  IV       LOS: 1 day    Time spent: >20min   Waldemar Dickens, MD [facility]   If 7PM-7AM, please contact night-coverage [email or web address] Password [facility] 06/08/2017, 9:24 AM

## 2017-06-08 NOTE — Progress Notes (Signed)
Progress Note  Patient Name: Johnny Morrow Date of Encounter: 06/08/2017  Primary Cardiologist: Harrington Challenger  Subjective   No CP  No SOB at rest    Inpatient Medications    Scheduled Meds: . amLODipine  10 mg Oral Daily  . docusate sodium  100 mg Oral BID  . doxazosin  1 mg Oral QPM  . enoxaparin (LOVENOX) injection  30 mg Subcutaneous Q24H  . ferrous sulfate  325 mg Oral Q breakfast  . furosemide  40 mg Oral Daily  . hydrALAZINE  50 mg Oral TID  . isosorbide mononitrate  60 mg Oral Daily  . potassium chloride  40 mEq Oral BID  . senna  1 tablet Oral BID   Continuous Infusions: . sodium chloride    . lactated ringers 1 mL (06/07/17 2039)   PRN Meds: acetaminophen **OR** acetaminophen, bisacodyl, HYDROcodone-acetaminophen, menthol-cetylpyridinium **OR** phenol, morphine injection, ondansetron **OR** ondansetron (ZOFRAN) IV, oxyCODONE, polyethylene glycol   Vital Signs    Vitals:   06/07/17 2216 06/08/17 0433 06/08/17 0645 06/08/17 0702  BP: (!) 96/50 (!) 104/56 (!) 102/53 (!) 102/54  Pulse: 61 77 74 74  Resp: 15 16 16 16   Temp: 98.5 F (36.9 C) 98.6 F (37 C) 98 F (36.7 C) 98 F (36.7 C)  TempSrc: Oral Oral Oral   SpO2: 97% 97% 98% 98%  Weight:      Height:        Intake/Output Summary (Last 24 hours) at 06/08/17 0832 Last data filed at 06/08/17 0400  Gross per 24 hour  Intake           1067.5 ml  Output              375 ml  Net            692.5 ml   Filed Weights   06/07/17 1008  Weight: 124 lb (56.2 kg)    Telemetry    SR - Personally Reviewed  ECG    Physical Exam   GEN: No acute distress.   Neck: No JVD Cardiac: RRR,II/VI systolic murmur base to apex  No rubs, or gallops.  Respiratory: Clear to auscultation bilaterally. GI: Soft, nontender, non-distended  MS: No edema; No deformity. Neuro:  Nonfocal  Psych: Normal affect   Labs    Chemistry Recent Labs Lab 06/07/17 1022 06/08/17 0413  NA 134* 137  K 4.5 5.5*  CL 107 106  CO2 17*  21*  GLUCOSE 133* 132*  BUN 76* 81*  CREATININE 3.73* 4.26*  CALCIUM 8.8* 8.3*  PROT 6.6  --   ALBUMIN 3.3*  --   AST 30  --   ALT 18  --   ALKPHOS 62  --   BILITOT 0.9  --   GFRNONAA 13* 11*  GFRAA 15* 12*  ANIONGAP 10 10     Hematology Recent Labs Lab 06/07/17 1022 06/08/17 0413  WBC 7.2 5.3  RBC 3.19* 2.31*  HGB 9.0* 6.6*  HCT 25.6* 18.6*  MCV 80.3 80.5  MCH 28.2 28.6  MCHC 35.2 35.5  RDW 13.7 14.0  PLT 148* 122*    Cardiac EnzymesNo results for input(s): TROPONINI in the last 168 hours. No results for input(s): TROPIPOC in the last 168 hours.   BNP Recent Labs Lab 06/07/17 1022  BNP 409.3*     DDimer No results for input(s): DDIMER in the last 168 hours.   Radiology    Dg Chest 1 View  Result Date: 06/07/2017 CLINICAL DATA:  Acute right hip fracture. Pre-op respiratory exam. Prostate carcinoma. EXAM: CHEST 1 VIEW COMPARISON:  09/30/2016 FINDINGS: Heart size is stable.  Severe thoracolumbar dextroscoliosis. Diffuse nodular pulmonary interstitial prominence is new since previous study. This is nonspecific, and may be due to pulmonary edema, viral pneumonitis, or possibly metastatic disease. No evidence of pneumothorax or pleural effusion. IMPRESSION: New diffuse nodular pulmonary interstitial prominence, which is nonspecific. Differential considerations include pulmonary edema, viral pneumonitis, or possibly metastatic disease. Recommend clinical correlation, and consider continued chest radiographic followup versus chest CT. Electronically Signed   By: Earle Gell M.D.   On: 06/07/2017 11:49   Ct Head Wo Contrast  Result Date: 06/07/2017 CLINICAL DATA:  Recent fall, trauma, high risk for injury EXAM: CT HEAD WITHOUT CONTRAST CT CERVICAL SPINE WITHOUT CONTRAST TECHNIQUE: Multidetector CT imaging of the head and cervical spine was performed following the standard protocol without intravenous contrast. Multiplanar CT image reconstructions of the cervical spine  were also generated. COMPARISON:  None available FINDINGS: CT HEAD FINDINGS Brain: Brain atrophy evident with advanced chronic white matter microvascular ischemic changes throughout both cerebral hemispheres. No acute intracranial hemorrhage, mass lesion, definite new infarction, midline shift, herniation, hydrocephalus, or extra-axial fluid collection. No focal mass effect or edema. Cisterns are patent. Cerebellar atrophy as well. Vascular: Intracranial atherosclerosis noted.  No hyperdense vessel. Skull: No depressed skull fracture.  Left mastoid effusion noted. Sinuses/Orbits: Chronic scattered sinus mucosal thickening. Orbits are unremarkable and symmetric. Other: None. CT CERVICAL SPINE FINDINGS Alignment: Normal alignment. No subluxation or dislocation. Facets are aligned. Multilevel facet arthropathy noted. Skull base and vertebrae: Bones are osteopenic. No acute osseous finding or fracture. Soft tissues and spinal canal: No prevertebral fluid or swelling. No visible canal hematoma. Carotid atherosclerosis evident. Disc levels: Multilevel degenerative spondylosis noted with disc space narrowing, sclerosis and endplate osteophytes spanning C2-C7 at all levels. Facet arthropathy most pronounced at C4-5 on the left. Upper chest: No acute finding.  Apical scarring. Other: None. IMPRESSION: Brain atrophy and chronic white matter microvascular changes. No acute intracranial abnormality by noncontrast CT Nonspecific left mastoid effusion Minor chronic sinus disease Cervical degenerative spondylosis and osteopenia without acute osseous finding, fracture or malalignment by CT. Electronically Signed   By: Jerilynn Mages.  Shick M.D.   On: 06/07/2017 11:35   Ct Cervical Spine Wo Contrast  Result Date: 06/07/2017 CLINICAL DATA:  Recent fall, trauma, high risk for injury EXAM: CT HEAD WITHOUT CONTRAST CT CERVICAL SPINE WITHOUT CONTRAST TECHNIQUE: Multidetector CT imaging of the head and cervical spine was performed following the  standard protocol without intravenous contrast. Multiplanar CT image reconstructions of the cervical spine were also generated. COMPARISON:  None available FINDINGS: CT HEAD FINDINGS Brain: Brain atrophy evident with advanced chronic white matter microvascular ischemic changes throughout both cerebral hemispheres. No acute intracranial hemorrhage, mass lesion, definite new infarction, midline shift, herniation, hydrocephalus, or extra-axial fluid collection. No focal mass effect or edema. Cisterns are patent. Cerebellar atrophy as well. Vascular: Intracranial atherosclerosis noted.  No hyperdense vessel. Skull: No depressed skull fracture.  Left mastoid effusion noted. Sinuses/Orbits: Chronic scattered sinus mucosal thickening. Orbits are unremarkable and symmetric. Other: None. CT CERVICAL SPINE FINDINGS Alignment: Normal alignment. No subluxation or dislocation. Facets are aligned. Multilevel facet arthropathy noted. Skull base and vertebrae: Bones are osteopenic. No acute osseous finding or fracture. Soft tissues and spinal canal: No prevertebral fluid or swelling. No visible canal hematoma. Carotid atherosclerosis evident. Disc levels: Multilevel degenerative spondylosis noted with disc space narrowing, sclerosis and endplate osteophytes spanning C2-C7  at all levels. Facet arthropathy most pronounced at C4-5 on the left. Upper chest: No acute finding.  Apical scarring. Other: None. IMPRESSION: Brain atrophy and chronic white matter microvascular changes. No acute intracranial abnormality by noncontrast CT Nonspecific left mastoid effusion Minor chronic sinus disease Cervical degenerative spondylosis and osteopenia without acute osseous finding, fracture or malalignment by CT. Electronically Signed   By: Jerilynn Mages.  Shick M.D.   On: 06/07/2017 11:35   Dg C-arm 1-60 Min  Result Date: 06/07/2017 CLINICAL DATA:  Right hip fracture fixation EXAM: DG C-ARM 61-120 MIN; OPERATIVE RIGHT HIP WITH PELVIS COMPARISON:  None.  FINDINGS: There are 4 C-arm fluoroscopic views provided from a right femoral nail fixation of a comminuted intertrochanteric fracture of the proximal femur with avulsed lesser trochanter. Less varus angulation is currently identified on these images. No evidence of hardware failure or immediate postoperative fracture. Improved alignment is demonstrated though there is still some cephalad displacement of the femoral shaft relative to the femoral neck. IMPRESSION: Status post nail fixation of intertrochanteric comminuted fracture the right femur. Electronically Signed   By: Ashley Royalty M.D.   On: 06/07/2017 23:13   Dg Hip Operative Unilat W Or W/o Pelvis Right  Result Date: 06/07/2017 CLINICAL DATA:  Right hip fracture fixation EXAM: DG C-ARM 61-120 MIN; OPERATIVE RIGHT HIP WITH PELVIS COMPARISON:  None. FINDINGS: There are 4 C-arm fluoroscopic views provided from a right femoral nail fixation of a comminuted intertrochanteric fracture of the proximal femur with avulsed lesser trochanter. Less varus angulation is currently identified on these images. No evidence of hardware failure or immediate postoperative fracture. Improved alignment is demonstrated though there is still some cephalad displacement of the femoral shaft relative to the femoral neck. IMPRESSION: Status post nail fixation of intertrochanteric comminuted fracture the right femur. Electronically Signed   By: Ashley Royalty M.D.   On: 06/07/2017 23:13   Dg Hip Unilat  With Pelvis 2-3 Views Right  Result Date: 06/07/2017 CLINICAL DATA:  Followup getting out of bed this morning. Right hip pain. Initial encounter. EXAM: DG HIP (WITH OR WITHOUT PELVIS) 2-3V RIGHT COMPARISON:  None. FINDINGS: Comminuted intertrochanteric fracture of the right hip is seen. No evidence of dislocation. No pelvic fracture identified. Generalized osteopenia noted. Bilateral pelvic surgical clips are seen from previous lymphadenectomy. Peripheral vascular calcification noted  in right thigh. IMPRESSION: Comminuted intertrochanteric right hip fracture. Electronically Signed   By: Earle Gell M.D.   On: 06/07/2017 11:43    Cardiac Studies    Patient Profile    Assessment & Plan    1  Hx of Diastolic CHF  Pt's volume status is not bad.  I would keep on same regimen    2  AS  Mild/mod  Follow clinically  3  REnal insuff  Cr 4.2   FOllow    4  Anemia  Pt underwent tx today     For questions or updates, please contact [place] Please consult [email or web address] for contact info under Cardiology/STEMI.      Signed, Dorris Carnes, MD  06/08/2017, 8:32 AM

## 2017-06-08 NOTE — Progress Notes (Signed)
Subjective: 1 Day Post-Op Procedure(s) (LRB): INTRAMEDULLARY (IM) NAIL INTERTROCHANTRIC (Right)  Patient reports pain as mild to moderate.  Tolerating POs well.  Admits to flatus.  Denies fever, chill, N/V, CP, SOB.  Working with therapy.  Wife at bedside.  Objective:   VITALS:  Temp:  [97.1 F (36.2 C)-99 F (37.2 C)] 99 F (37.2 C) (10/22 1040) Pulse Rate:  [51-79] 79 (10/22 1040) Resp:  [12-23] 15 (10/22 1040) BP: (96-152)/(50-77) 109/56 (10/22 1040) SpO2:  [93 %-100 %] 97 % (10/22 1040)  General: WDWN patient in NAD. Psych:  Appropriate mood and affect. Neuro:  A&O x 3, Moving all extremities, sensation intact to light touch HEENT:  EOMs intact Chest:  Even non-labored respirations Skin:  Dressing C/D/I, no rashes or lesions Extremities: warm/dry, mild edema, no erythema or echymosis.  No lymphadenopathy. Pulses: Popliteus 2+ MSK:  ROM: Active HF to 45 degrees, MMT: patient is able to perform a quad set, (-) Homan's    LABS  Recent Labs  06/07/17 1022 06/08/17 0413  HGB 9.0* 6.6*  WBC 7.2 5.3  PLT 148* 122*    Recent Labs  06/07/17 1022 06/08/17 0413  NA 134* 137  K 4.5 5.5*  CL 107 106  CO2 17* 21*  BUN 76* 81*  CREATININE 3.73* 4.26*  GLUCOSE 133* 132*   No results for input(s): LABPT, INR in the last 72 hours.   Assessment/Plan: 1 Day Post-Op Procedure(s) (LRB): INTRAMEDULLARY (IM) NAIL INTERTROCHANTRIC (Right)  WBAT R LE Up with therapy Lovenox for DVT prophylaxis Plan for 2 week outpatient post-op visit with Dr. Doran Durand.  Mechele Claude, PA-C [facility] Office:  [phone]

## 2017-06-08 NOTE — Evaluation (Signed)
Occupational Therapy Evaluation Patient Details Name: Johnny Morrow MRN: [id number] DOB: 1920-06-12 Today's Date: 06/08/2017    History of Present Illness Tailor Westfall is a 83 y.o. male with a past medical history significant for CKD IV baseline Cr 3.8-4.4, history of prostate cancer s/p resection, HTN, HFpEF, moderate AS, pAF not on AC due to recent large retroperitoneal bleed who presents with hip pain after a fall due to IT hip fx now s/p IM nail.   Clinical Impression   This 83 y/o M presents with the above. Pt lives with spouse, at baseline is independent with ADLs and mod independent with functional mobility using SPC. Pt presents with pain, decreased activity tolerance, and overall weakness; currently requires ModA +2 for stand pivot transfers at RW level, MaxA (+2) for LB ADLs. Pt will benefit from continued acute OT services and recommend additional OT services in SNF setting after discharge to maximize Pt's safety and independence with ADLs and mobility.     Follow Up Recommendations  SNF;Supervision/Assistance - 24 hour    Equipment Recommendations  Other (comment) (defer to next venue )           Precautions / Restrictions Precautions Precautions: Fall Restrictions Weight Bearing Restrictions: Yes RLE Weight Bearing: Weight bearing as tolerated      Mobility Bed Mobility Overal bed mobility: Needs Assistance Bed Mobility: Supine to Sit     Supine to sit: Max assist;+2 for physical assistance     General bed mobility comments: lifting help for trunk, assist to scoot to EOB and to support R LE as coming upright  Transfers Overall transfer level: Needs assistance Equipment used: Rolling walker (2 wheeled) Transfers: Sit to/from Stand Sit to Stand: Mod assist;+2 physical assistance         General transfer comment: lifting assist and lowering help from bed/to chair with cues for hand placement and R LE management, assist with RW and increased time to take steps to  chair    Balance Overall balance assessment: Needs assistance   Sitting balance-Leahy Scale: Poor Sitting balance - Comments: min A at least as leaning to R Postural control: Right lateral lean Standing balance support: Bilateral upper extremity supported Standing balance-Leahy Scale: Poor Standing balance comment: UE support and +2 A for balance                           ADL either performed or assessed with clinical judgement   ADL Overall ADL's : Needs assistance/impaired Eating/Feeding: Set up;Sitting   Grooming: Set up;Sitting;Wash/dry face   Upper Body Bathing: Min guard;Sitting   Lower Body Bathing: Moderate assistance;+2 for physical assistance;+2 for safety/equipment;Sit to/from stand   Upper Body Dressing : Sitting;Minimal assistance   Lower Body Dressing: Maximal assistance;+2 for physical assistance;+2 for safety/equipment;Sit to/from stand   Toilet Transfer: Moderate assistance;+2 for safety/equipment;+2 for physical assistance;Stand-pivot;BSC;RW Toilet Transfer Details (indicate cue type and reason): simulated in bed to chair transfer  [place] and Hygiene: Maximal assistance;+2 for physical assistance;+2 for safety/equipment;Sit to/from stand       Functional mobility during ADLs: Moderate assistance;+2 for physical assistance;+2 for safety/equipment;Rolling walker                           Pertinent Vitals/Pain Pain Assessment: 0-10 Pain Score: 7  Pain Location: R hip at rest, more with movement Pain Descriptors / Indicators: Discomfort;Grimacing;Guarding;Operative site guarding Pain Intervention(s): Monitored during session;Limited activity within patient's  tolerance;Repositioned;Ice applied;Premedicated before session;Patient requesting pain meds-RN notified     Hand Dominance Right   Extremity/Trunk Assessment Upper Extremity Assessment Upper Extremity Assessment: Generalized weakness   Lower Extremity  Assessment Lower Extremity Assessment: Defer to PT evaluation RLE Deficits / Details: limited by pain, flexion about 40, strength limited quats about 2+/5   Cervical / Trunk Assessment Cervical / Trunk Assessment: Kyphotic   Communication Communication Communication: No difficulties   Cognition Arousal/Alertness: Awake/alert Behavior During Therapy: Flat affect Overall Cognitive Status: Within Functional Limits for tasks assessed                                     General Comments  Wife in room and confirms likely to rehab at [place] expects to be discharged to:: Private residence Living Arrangements: Spouse/significant other Available Help at Discharge: Family Type of Home: House Home Access: Level entry     Home Layout: One level     Bathroom Shower/Tub: Tub/shower unit         Home Equipment: Cane - single point;Grab bars - tub/shower          Prior Functioning/Environment Level of Independence: Independent with assistive device(s)        Comments: used cane, was still driving        OT Problem List: Decreased strength;Impaired balance (sitting and/or standing);Pain;Decreased activity tolerance;Decreased knowledge of use of DME or AE      OT Treatment/Interventions: Self-care/ADL training;DME and/or AE instruction;Therapeutic activities;Balance training;Therapeutic exercise;Energy conservation;Patient/family education    OT Goals(Current goals can be found in the care plan section) Acute Rehab OT Goals Patient Stated Goal: To go to rehab OT Goal Formulation: With patient Time For Goal Achievement: 06/22/17 Potential to Achieve Goals: Good  OT Frequency: Min 2X/week               Co-evaluation PT/OT/SLP Co-Evaluation/Treatment: Yes Reason for Co-Treatment: For patient/therapist safety;To address functional/ADL transfers PT goals addressed during session: Mobility/safety with  mobility;Balance;Strengthening/ROM OT goals addressed during session: ADL's and self-care      AM-PAC PT "6 Clicks" Daily Activity     Outcome Measure Help from another person eating meals?: None Help from another person taking care of personal grooming?: A Little Help from another person toileting, which includes using toliet, bedpan, or urinal?: A Lot Help from another person bathing (including washing, rinsing, drying)?: A Lot Help from another person to put on and taking off regular upper body clothing?: A Little Help from another person to put on and taking off regular lower body clothing?: A Lot 6 Click Score: 16   End of Session Equipment Utilized During Treatment: Rolling walker;Gait belt Nurse Communication: Mobility status  Activity Tolerance: Patient tolerated treatment well Patient left: in chair;with call bell/phone within reach;with chair alarm set;with family/visitor present  OT Visit Diagnosis: Unsteadiness on feet (R26.81);Muscle weakness (generalized) (M62.81);Pain Pain - Right/Left: Right Pain - part of body: Hip                Time: 7672-0947 OT Time Calculation (min): 35 min Charges:  OT General Charges $OT Visit: 1 Visit OT Evaluation $OT Eval Moderate Complexity: 1 Mod G-Codes:     Lou Cal, OT Pager [phone] 06/08/2017   Raymondo Band 06/08/2017, 2:28 PM

## 2017-06-08 NOTE — Progress Notes (Signed)
CRITICAL VALUE ALERT  Critical Value:  hgh 6.6 Date & Time Notied:  06/08/17 @ 4944 Provider Notified: Mechele Claude, PA Orders Received/Actions taken: I paged Ortho on call

## 2017-06-08 NOTE — Progress Notes (Signed)
Nutrition Brief Note  RD consulted per hip fracture protocol.  Wt Readings from Last 15 Encounters:  06/07/17 124 lb (56.2 kg)  12/22/16 125 lb 4 oz (56.8 kg)  11/27/16 124 lb 4 oz (56.4 kg)  10/17/16 130 lb 1.9 oz (59 kg)  09/29/16 132 lb 4 oz (60 kg)  08/20/16 134 lb 8 oz (61 kg)  03/25/16 125 lb 6 oz (56.9 kg)  12/24/15 125 lb 8 oz (56.9 kg)  12/12/15 127 lb 6.4 oz (57.8 kg)  09/25/15 129 lb (58.5 kg)   Johnny Morrow is a 83 y.o. male with a past medical history significant for CKD IV baseline Cr 3.8-4.4, history of prostate cancer s/p resection, HTN, HFpEF, moderate AS, pAF not on AC due to recent large retroperitoneal bleed who presents with hip pain after a fall.  10/21- s/p Procedure(s):  Open treatment of right hip intertrochanteric fracture with intramedullary nailing  Pt very lethargic at time of visit. Hx obtained from pt wife at bedside. She reports that pt with good appetite and denies any weight loss PTA. She states that pt is on a strict "no sodium" diet at home, per the instruction of pt's cardiologist at [facility] (Dr. Flonnie Overman). Pt has his food specially prepared for him by the chef at [employer]   Pt wife with multiple concerns over sodium content in menu offerings. RD spent the majority of the visit reviewing menu with pt wife and identifying pt preferences within diet restrictions.   RD did not perform exam on pt, due to wife's request (pt did not sleep well last night and requested he not be wakened).   Body mass index is 21.28 kg/m. Patient meets criteria for normal weight range based on current BMI.   Current diet order is regular, patient is consuming approximately 75% of meals at this time. Labs and medications reviewed.   No nutrition interventions warranted at this time. If nutrition issues arise, please consult RD.   Johnny Morrow A. Jimmye Norman, RD, LDN, CDE Pager: [phone] After hours Pager: [phone]

## 2017-06-08 NOTE — Consult Note (Addendum)
        [facility] CM Primary Care Navigator  06/08/2017  Johnny Morrow 01-19-1920 [id number]   Met with patientand wife Johnny Morrow) at the bedside to identify possible discharge needs. Patient dozing on and off during this visit. Wife reports that patient was changing clothes, lost his balance and fell which resulted tothis admission/ surgery.  Patient's wife endorses Dr. Wenda Low with [facility] at [facility] as theprimary care provider.   Patient is using [facility] [address] to obtain medications without difficulty.   Patient has been managing his medications at home straight out of the containers.  Patient's wife reports that patient was driving prior to admission/ surgery. [name] transportation or wife will be providingtransportationto hisdoctors'appointments after discharge.  According to patient's wife, she will be his primary caregiver to assist with basic needs after discharge.  Anticipated discharge is SNF (skilled nursing facility) per therapy recommendation. Plan for discharge is to return back to [place] per wife with skilled level of care.  Patient's wife voiced understanding to call primary care provider's officewhen patient returns backhome,for a post discharge follow-up within a week or sooner if needs arise.Patient letter (with PCP's contact number) was provided as areminder.  Discussed with patient's wiferegarding [facility] CM services available for health managementat home and states that patient has been managing well (HF) at home and has no further needs or concerns at this time.Wife reports that patient has not been on any diabetes medications. She also states that patient weighs daily and records, follows diet restriction and follows-up with provider when needed.  She voiced understanding to seekreferral from primary care provider to [facility] care management ifdeemed necessary and appropriatefor services in the future-  when patient returns back home.   For questions, please contact:  Dannielle Huh, BSN, RN- [facility] Primary Care Navigator  Telephone: [phone] [place]

## 2017-06-08 NOTE — Evaluation (Signed)
Physical Therapy Evaluation Patient Details Name: Johnny Morrow MRN: [id number] DOB: 09-08-1919 Today's Date: 06/08/2017   History of Present Illness  Johnny Morrow is a 83 y.o. male with a past medical history significant for CKD IV baseline Cr 3.8-4.4, history of prostate cancer s/p resection, HTN, HFpEF, moderate AS, pAF not on AC due to recent large retroperitoneal bleed who presents with hip pain after a fall due to IT hip fx now s/p IM nail.  Clinical Impression  Patient presents with decreased mobility due to pain, decreased strength, decreased balance, decreased activity tolerance and will benefit from skilled PT in the acute setting to allow return home following SNF level rehab stay.     Follow Up Recommendations SNF    Equipment Recommendations  Rolling walker with 5" wheels    Recommendations for Other Services       Precautions / Restrictions Precautions Precautions: Fall Restrictions Weight Bearing Restrictions: Yes RLE Weight Bearing: Weight bearing as tolerated      Mobility  Bed Mobility Overal bed mobility: Needs Assistance Bed Mobility: Supine to Sit     Supine to sit: Max assist;+2 for physical assistance     General bed mobility comments: lifting help for trunk, assist to scoot to EOB and to support R LE as coming upright  Transfers Overall transfer level: Needs assistance Equipment used: Rolling walker (2 wheeled) Transfers: Sit to/from Stand Sit to Stand: Mod assist;+2 physical assistance         General transfer comment: lifting assist and lowering help from bed/to chair with cues for hand placement and R LE management, assist with RW and increased time to take steps to chair  Ambulation/Gait                Stairs            Wheelchair Mobility    Modified Rankin (Stroke Patients Only)       Balance Overall balance assessment: Needs assistance   Sitting balance-Leahy Scale: Poor Sitting balance - Comments: min A at least  as leaning to R Postural control: Right lateral lean Standing balance support: Bilateral upper extremity supported Standing balance-Leahy Scale: Poor Standing balance comment: UE support and +2 A for balance                             Pertinent Vitals/Pain Pain Assessment: 0-10 Pain Score: 7  Pain Location: R hip at rest, more with movement Pain Descriptors / Indicators: Discomfort;Grimacing;Guarding;Operative site guarding Pain Intervention(s): Monitored during session;Limited activity within patient's tolerance;Repositioned;Ice applied;Premedicated before session    Home Living Family/patient expects to be discharged to:: Private residence Living Arrangements: Spouse/significant other Available Help at Discharge: Family Type of Home: House Home Access: Level entry     [place]: One [place]: [place] - single point;Grab bars - tub/shower      Prior Function Level of Independence: Independent with assistive device(s)         Comments: used cane, was still driving     Hand Dominance   Dominant Hand: Right    Extremity/Trunk Assessment   Upper Extremity Assessment Upper Extremity Assessment: Defer to OT evaluation    Lower Extremity Assessment Lower Extremity Assessment: RLE deficits/detail RLE Deficits / Details: limited by pain, flexion about 40, strength limited quats about 2+/5    Cervical / Trunk Assessment Cervical / Trunk Assessment: Kyphotic  Communication   Communication: No difficulties  Cognition Arousal/Alertness: Awake/alert Behavior During  Therapy: Flat affect Overall Cognitive Status: Within Functional Limits for tasks assessed                                        General Comments General comments (skin integrity, edema, etc.): Wife in room and confirms likely to rehab at [facility] facility    Exercises Total Joint Exercises Ankle Circles/Pumps: AROM;Both;5 reps;Supine Quad Sets:  AROM;AAROM;Both;5 reps;Supine Heel Slides: AROM;AAROM;Both;5 reps;Supine   Assessment/Plan    PT Assessment Patient needs continued PT services  PT Problem List Decreased strength;Decreased range of motion;Decreased mobility;Pain;Decreased balance;Decreased knowledge of use of DME;Decreased activity tolerance;Decreased safety awareness;Decreased knowledge of precautions       PT Treatment Interventions DME instruction;Gait training;Functional mobility training;Balance training;Patient/family education;Therapeutic activities;Therapeutic exercise    PT Goals (Current goals can be found in the Care Plan section)  Acute Rehab PT Goals Patient Stated Goal: To go to rehab PT Goal Formulation: With patient/family Time For Goal Achievement: 06/15/17 Potential to Achieve Goals: Good    Frequency Min 3X/week   Barriers to discharge        Co-evaluation PT/OT/SLP Co-Evaluation/Treatment: Yes Reason for Co-Treatment: For patient/therapist safety;To address functional/ADL transfers PT goals addressed during session: Mobility/safety with mobility;Balance;Strengthening/ROM         AM-PAC PT "6 Clicks" Daily Activity  Outcome Measure Difficulty turning over in bed (including adjusting bedclothes, sheets and blankets)?: Unable Difficulty moving from lying on back to sitting on the side of the bed? : Unable Difficulty sitting down on and standing up from a chair with arms (e.g., wheelchair, bedside commode, etc,.)?: Unable Help needed moving to and from a bed to chair (including a wheelchair)?: A Lot Help needed walking in hospital room?: A Lot Help needed climbing 3-5 steps with a railing? : Total 6 Click Score: 8    End of Session Equipment Utilized During Treatment: Gait belt Activity Tolerance: Patient limited by fatigue;Patient limited by pain Patient left: in chair;with call bell/phone within reach;with family/visitor present Nurse Communication: Mobility status;Patient requests  pain meds PT Visit Diagnosis: History of falling (Z91.81);Muscle weakness (generalized) (M62.81);Difficulty in walking, not elsewhere classified (R26.2);Pain Pain - Right/Left: Right Pain - part of body: Hip    Time: 1829-9371 PT Time Calculation (min) (ACUTE ONLY): 38 min   Charges:   PT Evaluation $PT Eval Moderate Complexity: 1 Mod     PT G CodesMagda Kiel, [place] [phone] 06/08/2017   Johnny Morrow 06/08/2017, 1:43 PM

## 2017-06-08 NOTE — Progress Notes (Signed)
Mechele Claude, PA called said  I need to call the hospitalist on call for the blood transfusion order.

## 2017-06-08 NOTE — Progress Notes (Signed)
Pt hgb 6.6 initiated 1st unit of PRBC verified by another nurse Lanice Shirts with consent will continue to monitor blood transfusion reaction.

## 2017-06-09 LAB — BASIC METABOLIC PANEL
ANION GAP: 9 (ref 5–15)
ANION GAP: 9 (ref 5–15)
BUN: 85 mg/dL — AB (ref 6–20)
BUN: 85 mg/dL — ABNORMAL HIGH (ref 6–20)
CALCIUM: 8.5 mg/dL — AB (ref 8.9–10.3)
CHLORIDE: 110 mmol/L (ref 101–111)
CO2: 19 mmol/L — ABNORMAL LOW (ref 22–32)
CO2: 16 mmol/L — ABNORMAL LOW (ref 22–32)
Calcium: 8.1 mg/dL — ABNORMAL LOW (ref 8.9–10.3)
Chloride: 108 mmol/L (ref 101–111)
Creatinine, Ser: 4.93 mg/dL — ABNORMAL HIGH (ref 0.61–1.24)
Creatinine, Ser: 5.03 mg/dL — ABNORMAL HIGH (ref 0.61–1.24)
GFR calc Af Amer: 10 mL/min — ABNORMAL LOW (ref >60)
GFR, EST AFRICAN AMERICAN: 10 mL/min — AB (ref >60)
GFR, EST NON AFRICAN AMERICAN: 9 mL/min — AB (ref >60)
GFR, EST NON AFRICAN AMERICAN: 9 mL/min — AB (ref >60)
GLUCOSE: 101 mg/dL — AB (ref 65–99)
Glucose, Bld: 141 mg/dL — ABNORMAL HIGH (ref 65–99)
POTASSIUM: 4.8 mmol/L (ref 3.5–5.1)
POTASSIUM: 4.6 mmol/L (ref 3.5–5.1)
SODIUM: 136 mmol/L (ref 135–145)
SODIUM: 135 mmol/L (ref 135–145)

## 2017-06-09 LAB — CBC
HEMATOCRIT: 24.6 % — AB (ref 39.0–52.0)
HEMATOCRIT: 23.1 % — AB (ref 39.0–52.0)
HEMOGLOBIN: 8.6 g/dL — AB (ref 13.0–17.0)
Hemoglobin: 7.9 g/dL — ABNORMAL LOW (ref 13.0–17.0)
MCH: 28.4 pg (ref 26.0–34.0)
MCH: 27.9 pg (ref 26.0–34.0)
MCHC: 35 g/dL (ref 30.0–36.0)
MCHC: 34.2 g/dL (ref 30.0–36.0)
MCV: 81.2 fL (ref 78.0–100.0)
MCV: 81.6 fL (ref 78.0–100.0)
Platelets: 95 10*3/uL — ABNORMAL LOW (ref 150–400)
Platelets: 95 10*3/uL — ABNORMAL LOW (ref 150–400)
RBC: 3.03 MIL/uL — ABNORMAL LOW (ref 4.22–5.81)
RBC: 2.83 MIL/uL — ABNORMAL LOW (ref 4.22–5.81)
RDW: 14.4 % (ref 11.5–15.5)
RDW: 14.1 % (ref 11.5–15.5)
WBC: 7.7 10*3/uL (ref 4.0–10.5)
WBC: 6.5 10*3/uL (ref 4.0–10.5)

## 2017-06-09 MED ORDER — TRAMADOL HCL 50 MG PO TABS: 50.0000 mg | ORAL_TABLET | Freq: Four times a day (QID) | ORAL | 0 refills | Status: DC | PRN

## 2017-06-09 MED ORDER — SODIUM CHLORIDE 0.45 % IV SOLN
INTRAVENOUS | Status: DC
  Administered 2017-06-09: 15:00:00 via INTRAVENOUS

## 2017-06-09 NOTE — Care Management Note (Signed)
Case Management Note  Patient Details  Name: Jartavious Mckimmy MRN: [id number] Date of Birth: [birth year]  Subjective/Objective:                    Action/Plan:   Expected Discharge Date:  06/09/17               Expected Discharge Plan:  Skilled Nursing Facility  In-House Referral:  Clinical Social Work  Discharge planning Services     Post Acute Care Choice:    Choice offered to:     DME Arranged:    DME Agency:     HH Arranged:    [place] Agency:     Status of Service:  In process, will continue to follow  If discussed at Long Length of Stay Meetings, dates discussed:    Additional Comments:  Marilu Favre, RN 06/09/2017, 10:38 AM

## 2017-06-09 NOTE — NC FL2 (Signed)
[place] LEVEL OF CARE SCREENING TOOL     IDENTIFICATION  Patient Name: Johnny Morrow Birthdate: 1919-08-31 Sex: male Admission Date (Current Location): 06/07/2017  [facility] and [place] Number:  Herbalist and Address:  [facility]. [facility], [place] [address] [place], [place] [address]      Provider Number: [id number]  Attending Physician Name and Address:  Waldemar Dickens, MD  Relative Name and Phone Number:       Current Level of Care: Hospital Recommended Level of Care: [place] Prior Approval Number:    Date Approved/Denied:   PASRR Number: [id number]  Discharge Plan: SNF    Current Diagnoses: Patient Active Problem List   Diagnosis Date Noted  . Closed intertrochanteric fracture of hip, right, initial encounter ([place]) 06/07/2017  . Acute on chronic diastolic CHF (congestive heart failure) ([place]) 06/07/2017  . AF (paroxysmal atrial fibrillation) ([place]) 12/22/2016  . Pulmonary nodule less than 6 cm determined by computed tomography of lung 11/27/2016  . Normocytic anemia 11/27/2016  . Chronic kidney disease (CKD), stage IV (severe) ([place]) 08/20/2016  . Hyperlipidemia 08/20/2016  . Moderate tricuspid regurgitation 01/11/2016  . Mild aortic stenosis 01/11/2016  . Mild aortic regurgitation 01/11/2016  . Osteoporosis 01/11/2016  . Prediabetes 12/19/2015  . Essential hypertension, benign 09/25/2015  . History of prostate cancer 09/25/2015  . Idiopathic scoliosis, thoracic 09/25/2015  . History of DVT of lower extremity, left 09/25/2015    Orientation RESPIRATION BLADDER Height & Weight     Self, Time, Situation, Place  O2 ( 1L) Continent Weight: 123 lb 0.3 oz (55.8 kg) Height:  5\' 4"  (162.6 cm)  BEHAVIORAL SYMPTOMS/MOOD NEUROLOGICAL BOWEL NUTRITION STATUS      Continent    AMBULATORY STATUS COMMUNICATION OF NEEDS Skin   Limited Assist Verbally Surgical wounds (closed incision, right leg 54/00/86; silicone  dressing)                       Personal Care Assistance Level of Assistance  Bathing, Dressing Bathing Assistance: Limited assistance   Dressing Assistance: Limited assistance     Functional Limitations Info             [place]  PT (By licensed PT), OT (By licensed OT)     PT Frequency: 5x/wk OT Frequency: 5x/wk            Contractures      Additional Factors Info  Code Status, Allergies Code Status Info: full Allergies Info: Iodinated Diagnostic Agents, Penicillins           Current Medications (06/09/2017):  This is the current hospital active medication list Current Facility-Administered Medications  Medication Dose Route Frequency Provider Last Rate Last Dose  . 0.9 %  sodium chloride infusion   Intravenous Once Schorr, Rhetta Mura, NP      . 0.9 %  sodium chloride infusion   Intravenous Continuous Fay Records, MD 75 mL/hr at 06/09/17 0009    . acetaminophen (TYLENOL) tablet 650 mg  650 mg Oral Q6H PRN [name], MD       Or  . acetaminophen (TYLENOL) suppository 650 mg  650 mg Rectal Q6H PRN [name], MD      . amLODipine (NORVASC) tablet 10 mg  10 mg Oral Daily Edwin Dada, MD   10 mg at 06/09/17 0941  . bisacodyl (DULCOLAX) suppository 10 mg  10 mg Rectal Daily PRN Danford, Suann Larry, MD      .  docusate sodium (COLACE) capsule 100 mg  100 mg Oral BID Edwin Dada, MD   100 mg at 06/09/17 0942  . doxazosin (CARDURA) tablet 1 mg  1 mg Oral QPM Danford, Suann Larry, MD   1 mg at 06/08/17 2100  . enoxaparin (LOVENOX) injection 30 mg  30 mg Subcutaneous Q24H [name], MD   30 mg at 06/09/17 0945  . ferrous sulfate tablet 325 mg  325 mg Oral Q breakfast Edwin Dada, MD   325 mg at 06/09/17 9629  . furosemide (LASIX) tablet 40 mg  40 mg Oral Daily Edwin Dada, MD   40 mg at 06/09/17 0942  . hydrALAZINE (APRESOLINE) tablet 50 mg  50 mg Oral TID Edwin Dada, MD   50  mg at 06/09/17 0941  . HYDROcodone-acetaminophen (NORCO/VICODIN) 5-325 MG per tablet 1-2 tablet  1-2 tablet Oral Q6H PRN Edwin Dada, MD   2 tablet at 06/09/17 0943  . isosorbide mononitrate (IMDUR) 24 hr tablet 60 mg  60 mg Oral Daily Edwin Dada, MD   60 mg at 06/09/17 0940  . lactated ringers infusion   Intravenous Continuous [name], MD 50 mL/hr at 06/07/17 2039 1 mL at 06/07/17 2039  . menthol-cetylpyridinium (CEPACOL) lozenge 3 mg  1 lozenge Oral PRN [name], MD       Or  . phenol (CHLORASEPTIC) mouth spray 1 spray  1 spray Mouth/Throat PRN [name], MD      . methocarbamol (ROBAXIN) 500 mg in dextrose 5 % 50 mL IVPB  500 mg Intravenous Q8H PRN Waldemar Dickens, MD   Stopped at 06/09/17 [phone]  . morphine 4 MG/ML injection 0.52 mg  0.52 mg Intravenous Q2H PRN Edwin Dada, MD   0.52 mg at 06/07/17 1933  . ondansetron (ZOFRAN) tablet 4 mg  4 mg Oral Q6H PRN [name], MD       Or  . ondansetron [facility]) injection 4 mg  4 mg Intravenous Q6H PRN [name], MD      . oxyCODONE (Oxy IR/ROXICODONE) immediate release tablet 5 mg  5 mg Oral Q6H PRN [name], MD   5 mg at 06/08/17 1820  . polyethylene glycol (MIRALAX / GLYCOLAX) packet 17 g  17 g Oral Daily PRN Danford, Suann Larry, MD      . senna (SENOKOT) tablet 8.6 mg  1 tablet Oral BID [name], MD   8.6 mg at 06/09/17 1324     Discharge Medications: Please see discharge summary for a list of discharge medications.  Relevant Imaging Results:  Relevant Lab Results:   Additional Information SS#: [id number]  Geralynn Ochs, LCSW

## 2017-06-09 NOTE — Clinical Social Work Note (Signed)
Clinical Social Work Assessment  Patient Details  Name: Johnny Morrow MRN: [id number] Date of Birth: 1919-11-29  Date of referral:  06/09/17               Reason for consult:  Facility Placement                Permission sought to share information with:  Facility Sport and exercise psychologist, Family Supports Permission granted to share information::  Yes, Verbal Permission Granted  Name::     Ecologist::  SNF  Relationship::  Wife  Contact Information:     Housing/Transportation Living arrangements for the past 2 months:  [place] of Information:  Patient, Spouse Patient Interpreter Needed:  None Criminal Activity/Legal Involvement Pertinent to Current Situation/Hospitalization:  No - Comment as needed Significant Relationships:  Adult Children, Spouse Lives with:  Self, Spouse Do you feel safe going back to the place where you live?  Yes Need for family participation in patient care:  No (Coment)  Care giving concerns:  Patient has been living at [facility], but will benefit from short term rehab prior to returning home at discharge.   Social Worker assessment / plan:  CSW met with patient and patient's wife at bedside to discuss recommendation for SNF. CSW provided facility list and discussed insurance authorization information and benefits, as well as the different between receiving rehab at [facility] versus admitting to another facility. CSW answered questions and will follow up with patient and patient's wife to determine discharge plan.  Employment status:  Retired Forensic scientist:  Medicare PT Recommendations:  [place] / Referral to community resources:     Patient/Family's Response to care:  Patient and patient's wife agreeable to SNF placement.  Patient/Family's Understanding of and Emotional Response to Diagnosis, Current Treatment, and Prognosis:  Patient was groggy during discussion and  asked CSW to talk to patient's wife. Patient's wife had questions about admitting to SNF at [facility] and what the costs would be as opposed to admitting to another facility. Patient's wife discussed how she would prefer to admit to a facility that would bill the patient's Medicare so that it would save them the money. Patient's wife appreciated information from [place] and assistance in coordinating discharge plan. Patient's wife will review information and discuss preferences with CSW tomorrow.  Emotional Assessment Appearance:  Appears stated age Attitude/Demeanor/Rapport:    Affect (typically observed):  Appropriate Orientation:  Oriented to Self, Oriented to Place, Oriented to  Time, Oriented to Situation Alcohol / Substance use:  Not Applicable Psych involvement (Current and /or in the community):  No (Comment)  Discharge Needs  Concerns to be addressed:  Care Coordination Readmission within the last 30 days:  No Current discharge risk:  Physical Impairment Barriers to Discharge:  Continued Medical Work up, [place], [place] 06/09/2017, 5:13 PM

## 2017-06-09 NOTE — Progress Notes (Signed)
Progress Note  Patient Name: Johnny Morrow Date of Encounter: 06/09/2017  Primary Cardiologist:  Subjective   No CP  No SOB at rest    Inpatient Medications    Scheduled Meds: . amLODipine  10 mg Oral Daily  . docusate sodium  100 mg Oral BID  . doxazosin  1 mg Oral QPM  . enoxaparin (LOVENOX) injection  30 mg Subcutaneous Q24H  . ferrous sulfate  325 mg Oral Q breakfast  . furosemide  40 mg Oral Daily  . hydrALAZINE  50 mg Oral TID  . isosorbide mononitrate  60 mg Oral Daily  . senna  1 tablet Oral BID   Continuous Infusions: . sodium chloride    . sodium chloride 75 mL/hr at 06/09/17 0009  . lactated ringers 1 mL (06/07/17 2039)  . methocarbamol (ROBAXIN)  IV Stopped (06/09/17 0516)   PRN Meds: acetaminophen **OR** acetaminophen, bisacodyl, HYDROcodone-acetaminophen, menthol-cetylpyridinium **OR** phenol, methocarbamol (ROBAXIN)  IV, morphine injection, ondansetron **OR** ondansetron (ZOFRAN) IV, oxyCODONE, polyethylene glycol   Vital Signs    Vitals:   06/08/17 1653 06/08/17 1940 06/09/17 0430 06/09/17 0937  BP: 117/66 (!) 142/79 138/76 130/70  Pulse: 69 75 66   Resp:  18 17   Temp: 98.1 F (36.7 C) 98.4 F (36.9 C) 98.3 F (36.8 C)   TempSrc: Oral Oral    SpO2: 98% 99% 92%   Weight:   123 lb 0.3 oz (55.8 kg)   Height:        Intake/Output Summary (Last 24 hours) at 06/09/17 1033 Last data filed at 06/09/17 0929  Gross per 24 hour  Intake          1400.42 ml  Output              925 ml  Net           475.42 ml   Filed Weights   06/07/17 1008 06/09/17 0430  Weight: 124 lb (56.2 kg) 123 lb 0.3 oz (55.8 kg)    Telemetry    SR   - Personally Reviewed  ECG      Physical Exam   GEN: No acute distress.  Pt sleepy Neck: No JVD Cardiac: RRR, no murmurs, rubs, or gallops.  Respiratory: Clear to auscultation bilaterally. GI: Soft, nontender, non-distended  MS: No edema; Neuro:  Nonfocal    Labs    Chemistry Recent Labs Lab 06/07/17 1022  06/08/17 0413 06/08/17 1550 06/09/17 0737  NA 134* 137 133* 136  K 4.5 5.5* 5.7* 4.8  CL 107 106 106 108  CO2 17* 21* 18* 19*  GLUCOSE 133* 132* 114* 101*  BUN 76* 81* 85* 85*  CREATININE 3.73* 4.26* 4.85* 4.93*  CALCIUM 8.8* 8.3* 8.2* 8.5*  PROT 6.6  --   --   --   ALBUMIN 3.3*  --   --   --   AST 30  --   --   --   ALT 18  --   --   --   ALKPHOS 62  --   --   --   BILITOT 0.9  --   --   --   GFRNONAA 13* 11* 9* 9*  GFRAA 15* 12* 11* 10*  ANIONGAP 10 10 9 9      Hematology Recent Labs Lab 06/07/17 1022 06/08/17 0413 06/08/17 2147 06/09/17 0737  WBC 7.2 5.3  --  PENDING  RBC 3.19* 2.31*  --  3.03*  HGB 9.0* 6.6* 8.8* 8.6*  HCT 25.6*  18.6* 25.2* 24.6*  MCV 80.3 80.5  --  81.2  MCH 28.2 28.6  --  28.4  MCHC 35.2 35.5  --  35.0  RDW 13.7 14.0  --  14.4  PLT 148* 122*  --  PENDING    Cardiac EnzymesNo results for input(s): TROPONINI in the last 168 hours. No results for input(s): TROPIPOC in the last 168 hours.   BNP Recent Labs Lab 06/07/17 1022  BNP 409.3*     DDimer No results for input(s): DDIMER in the last 168 hours.   Radiology    Dg Chest 1 View  Result Date: 06/07/2017 CLINICAL DATA:  Acute right hip fracture. Pre-op respiratory exam. Prostate carcinoma. EXAM: CHEST 1 VIEW COMPARISON:  09/30/2016 FINDINGS: Heart size is stable.  Severe thoracolumbar dextroscoliosis. Diffuse nodular pulmonary interstitial prominence is new since previous study. This is nonspecific, and may be due to pulmonary edema, viral pneumonitis, or possibly metastatic disease. No evidence of pneumothorax or pleural effusion. IMPRESSION: New diffuse nodular pulmonary interstitial prominence, which is nonspecific. Differential considerations include pulmonary edema, viral pneumonitis, or possibly metastatic disease. Recommend clinical correlation, and consider continued chest radiographic followup versus chest CT. Electronically Signed   By: Earle Gell M.D.   On: 06/07/2017 11:49    Ct Head Wo Contrast  Result Date: 06/07/2017 CLINICAL DATA:  Recent fall, trauma, high risk for injury EXAM: CT HEAD WITHOUT CONTRAST CT CERVICAL SPINE WITHOUT CONTRAST TECHNIQUE: Multidetector CT imaging of the head and cervical spine was performed following the standard protocol without intravenous contrast. Multiplanar CT image reconstructions of the cervical spine were also generated. COMPARISON:  None available FINDINGS: CT HEAD FINDINGS Brain: Brain atrophy evident with advanced chronic white matter microvascular ischemic changes throughout both cerebral hemispheres. No acute intracranial hemorrhage, mass lesion, definite new infarction, midline shift, herniation, hydrocephalus, or extra-axial fluid collection. No focal mass effect or edema. Cisterns are patent. Cerebellar atrophy as well. Vascular: Intracranial atherosclerosis noted.  No hyperdense vessel. Skull: No depressed skull fracture.  Left mastoid effusion noted. Sinuses/Orbits: Chronic scattered sinus mucosal thickening. Orbits are unremarkable and symmetric. Other: None. CT CERVICAL SPINE FINDINGS Alignment: Normal alignment. No subluxation or dislocation. Facets are aligned. Multilevel facet arthropathy noted. Skull base and vertebrae: Bones are osteopenic. No acute osseous finding or fracture. Soft tissues and spinal canal: No prevertebral fluid or swelling. No visible canal hematoma. Carotid atherosclerosis evident. Disc levels: Multilevel degenerative spondylosis noted with disc space narrowing, sclerosis and endplate osteophytes spanning C2-C7 at all levels. Facet arthropathy most pronounced at C4-5 on the left. Upper chest: No acute finding.  Apical scarring. Other: None. IMPRESSION: Brain atrophy and chronic white matter microvascular changes. No acute intracranial abnormality by noncontrast CT Nonspecific left mastoid effusion Minor chronic sinus disease Cervical degenerative spondylosis and osteopenia without acute osseous finding,  fracture or malalignment by CT. Electronically Signed   By: Jerilynn Mages.  Shick M.D.   On: 06/07/2017 11:35   Ct Cervical Spine Wo Contrast  Result Date: 06/07/2017 CLINICAL DATA:  Recent fall, trauma, high risk for injury EXAM: CT HEAD WITHOUT CONTRAST CT CERVICAL SPINE WITHOUT CONTRAST TECHNIQUE: Multidetector CT imaging of the head and cervical spine was performed following the standard protocol without intravenous contrast. Multiplanar CT image reconstructions of the cervical spine were also generated. COMPARISON:  None available FINDINGS: CT HEAD FINDINGS Brain: Brain atrophy evident with advanced chronic white matter microvascular ischemic changes throughout both cerebral hemispheres. No acute intracranial hemorrhage, mass lesion, definite new infarction, midline shift, herniation, hydrocephalus, or extra-axial  fluid collection. No focal mass effect or edema. Cisterns are patent. Cerebellar atrophy as well. Vascular: Intracranial atherosclerosis noted.  No hyperdense vessel. Skull: No depressed skull fracture.  Left mastoid effusion noted. Sinuses/Orbits: Chronic scattered sinus mucosal thickening. Orbits are unremarkable and symmetric. Other: None. CT CERVICAL SPINE FINDINGS Alignment: Normal alignment. No subluxation or dislocation. Facets are aligned. Multilevel facet arthropathy noted. Skull base and vertebrae: Bones are osteopenic. No acute osseous finding or fracture. Soft tissues and spinal canal: No prevertebral fluid or swelling. No visible canal hematoma. Carotid atherosclerosis evident. Disc levels: Multilevel degenerative spondylosis noted with disc space narrowing, sclerosis and endplate osteophytes spanning C2-C7 at all levels. Facet arthropathy most pronounced at C4-5 on the left. Upper chest: No acute finding.  Apical scarring. Other: None. IMPRESSION: Brain atrophy and chronic white matter microvascular changes. No acute intracranial abnormality by noncontrast CT Nonspecific left mastoid effusion  Minor chronic sinus disease Cervical degenerative spondylosis and osteopenia without acute osseous finding, fracture or malalignment by CT. Electronically Signed   By: Jerilynn Mages.  Shick M.D.   On: 06/07/2017 11:35   Dg C-arm 1-60 Min  Result Date: 06/07/2017 CLINICAL DATA:  Right hip fracture fixation EXAM: DG C-ARM 61-120 MIN; OPERATIVE RIGHT HIP WITH PELVIS COMPARISON:  None. FINDINGS: There are 4 C-arm fluoroscopic views provided from a right femoral nail fixation of a comminuted intertrochanteric fracture of the proximal femur with avulsed lesser trochanter. Less varus angulation is currently identified on these images. No evidence of hardware failure or immediate postoperative fracture. Improved alignment is demonstrated though there is still some cephalad displacement of the femoral shaft relative to the femoral neck. IMPRESSION: Status post nail fixation of intertrochanteric comminuted fracture the right femur. Electronically Signed   By: Ashley Royalty M.D.   On: 06/07/2017 23:13   Dg Hip Operative Unilat W Or W/o Pelvis Right  Result Date: 06/07/2017 CLINICAL DATA:  Right hip fracture fixation EXAM: DG C-ARM 61-120 MIN; OPERATIVE RIGHT HIP WITH PELVIS COMPARISON:  None. FINDINGS: There are 4 C-arm fluoroscopic views provided from a right femoral nail fixation of a comminuted intertrochanteric fracture of the proximal femur with avulsed lesser trochanter. Less varus angulation is currently identified on these images. No evidence of hardware failure or immediate postoperative fracture. Improved alignment is demonstrated though there is still some cephalad displacement of the femoral shaft relative to the femoral neck. IMPRESSION: Status post nail fixation of intertrochanteric comminuted fracture the right femur. Electronically Signed   By: Ashley Royalty M.D.   On: 06/07/2017 23:13   Dg Hip Unilat  With Pelvis 2-3 Views Right  Result Date: 06/07/2017 CLINICAL DATA:  Followup getting out of bed this morning.  Right hip pain. Initial encounter. EXAM: DG HIP (WITH OR WITHOUT PELVIS) 2-3V RIGHT COMPARISON:  None. FINDINGS: Comminuted intertrochanteric fracture of the right hip is seen. No evidence of dislocation. No pelvic fracture identified. Generalized osteopenia noted. Bilateral pelvic surgical clips are seen from previous lymphadenectomy. Peripheral vascular calcification noted in right thigh. IMPRESSION: Comminuted intertrochanteric right hip fracture. Electronically Signed   By: Earle Gell M.D.   On: 06/07/2017 11:43    Cardiac Studies     Patient Profile       Assessment & Plan    1  Hx of atrial flutter  Remains in SR  With PACs  No anticoag given anemia    2  Mod AS  3  Hx chronic diastolic CHF  Volume status is not bad  Note getting IV fluids  Need  to follw closely    4  HTN  BP is OK  Continue meds    5  Renal  BUN/Ctr signif increased from 1 year ago and from 2 days ago  Currently getting IV fluids  Need to be cautious  Check later today Will stop at 6 PM so pt can be reassesssed   6  Anemia  Repeat CBC later today after hydration.      For questions or updates, please contact [place] Please consult [email or web address] for contact info under Cardiology/STEMI.      Signed, Dorris Carnes, MD  06/09/2017, 10:33 AM

## 2017-06-09 NOTE — Progress Notes (Addendum)
PROGRESS NOTE    Johnny Morrow  TDV:[id number] DOB: 1920/06/30 DOA: 06/07/2017 PCP: Wenda Low, MD    Brief Narrative:  Johnny Morrow is a 83 y.o. male with a past medical history significant for CKD IV baseline Cr 3.8-4.4, history of prostate cancer s/p resection, HTN, HFpEF, moderate AS, pAF not on AC due to recent large retroperitoneal bleed who presents with hip pain after a fall.  The patient was in his normal state of health until the morning of admission on 10/21, he was changing, lost his balance, fell and had immediate right hip pain and could not walk.  There was no preceding dizziness, weakness, lightheadedness or vertigo, no chest discomfort, palpitations, or dyspnea.   Assessment & Plan:   Principal Problem:   Closed intertrochanteric fracture of hip, right, initial encounter [facility]) Active Problems:   Essential hypertension, benign   History of DVT of lower extremity, left   Chronic kidney disease (CKD), stage IV (severe) (HCC)   Normocytic anemia   AF (paroxysmal atrial fibrillation) (HCC)   Acute on chronic diastolic CHF (congestive heart failure) (HCC)  Hip Fracture: POD #2. Dr Doran Durand took pt to OR on 10/21 for right hip intertrochanteric fracture with intramedullary nailing.  - Ortho - PT/OT - anticipate DC on 10/24 or 25.   Acute on chronic diastolic CHF: mild at time of admission. Given single dose of lasix 80mg  IV then put back on home dose. +125cc since admission - doubt accurate measurements. No evidence of overload at this time.  - Continue Lasix  -Daily weights -Strict I/Os -Daily BMP  AoCKD IV-V: Cr 4.9. Baseline Cr 3.8. Followed by [facility]. Likely due to hypoperfusion from surgery and anemia w/ increased lasix dose on day of admission.  - BMP in am - Gentle IVF  Acute on chronic anemia: Hgb 8.6 after 2 units PRBC on 10/22. Hgb 9.0 on day of admission.  - CBC in pm and am - Consider Epo but will defer to outpt renal  PAF: CHA2DS2-VASc = 5. Not an  anticoagulation candidate due to bleeding issues in past. curretnly rate controlled. -  tele  HTN: - continue norvasc, imdur, hydralazine  Chronic DVT: popliteal veing thrombosis. Not an anticoagulation candidate.    DVT prophylaxis: lovenox Code Status: full Family Communication: wife Disposition Plan: pending improvement in overall condition and likely SNF/rehab admission. Likely 10/24 or 10/25   Consultants:   ortho  Procedures:  INTRAMEDULLARY (IM) NAIL INTERTROCHANTRIC (Right) 10/21  Antimicrobials:   Perioperative Ancef   Subjective: Feels better. Passing flatus. Appetite improving. Feels less foggy/sedated. Pain well controlled. Denies CP, palp, sob, n/v.    Objective: Vitals:   06/08/17 1653 06/08/17 1940 06/09/17 0430 06/09/17 0937  BP: 117/66 (!) 142/79 138/76 130/70  Pulse: 69 75 66   Resp:  18 17   Temp: 98.1 F (36.7 C) 98.4 F (36.9 C) 98.3 F (36.8 C)   TempSrc: Oral Oral    SpO2: 98% 99% 92%   Weight:   55.8 kg (123 lb 0.3 oz)   Height:        Intake/Output Summary (Last 24 hours) at 06/09/17 1408 Last data filed at 06/09/17 1318  Gross per 24 hour  Intake          1008.75 ml  Output             1030 ml  Net           -21.25 ml   Filed Weights   06/07/17 1008 06/09/17  0430  Weight: 56.2 kg (124 lb) 55.8 kg (123 lb 0.3 oz)    Examination:  General exam: Elderly and frail appearing, sitting up in bed. Respiratory system: Clear to auscultation. Respiratory effort normal. Cardiovascular system: Faint heart sounds. II/VI systolic murmur. 2+ pulses. Gastrointestinal system: Abdomen is nondistended, soft and nontender.  Central nervous system: Alert and oriented. No focal neurological deficits. Extremities: UE symmetrical strength and ROM. No bony abnormality noted. . Skin: No rashes, lesions or ulcers Psychiatry: Judgement and insight appear normal. Mood & affect appropriate.     Data Reviewed: I have personally reviewed following labs  and imaging studies  CBC:  Recent Labs Lab 06/07/17 1022 06/08/17 0413 06/08/17 2147 06/09/17 0737  WBC 7.2 5.3  --  7.7  NEUTROABS 5.4  --   --   --   HGB 9.0* 6.6* 8.8* 8.6*  HCT 25.6* 18.6* 25.2* 24.6*  MCV 80.3 80.5  --  81.2  PLT 148* 122*  --  95*   Basic Metabolic Panel:  Recent Labs Lab 06/07/17 1022 06/08/17 0413 06/08/17 1550 06/09/17 0737  NA 134* 137 133* 136  K 4.5 5.5* 5.7* 4.8  CL 107 106 106 108  CO2 17* 21* 18* 19*  GLUCOSE 133* 132* 114* 101*  BUN 76* 81* 85* 85*  CREATININE 3.73* 4.26* 4.85* 4.93*  CALCIUM 8.8* 8.3* 8.2* 8.5*   GFR: Estimated Creatinine Clearance: 7.2 mL/min (A) (by C-G formula based on SCr of 4.93 mg/dL (H)). Liver Function Tests:  Recent Labs Lab 06/07/17 1022  AST 30  ALT 18  ALKPHOS 62  BILITOT 0.9  PROT 6.6  ALBUMIN 3.3*   No results for input(s): LIPASE, AMYLASE in the last 168 hours. No results for input(s): AMMONIA in the last 168 hours. Coagulation Profile: No results for input(s): INR, PROTIME in the last 168 hours. Cardiac Enzymes: No results for input(s): CKTOTAL, CKMB, CKMBINDEX, TROPONINI in the last 168 hours. BNP (last 3 results)  Recent Labs  08/20/16 1221 12/22/16 1453  PROBNP 373.0* 999.0*   HbA1C: No results for input(s): HGBA1C in the last 72 hours. CBG: No results for input(s): GLUCAP in the last 168 hours. Lipid Profile: No results for input(s): CHOL, HDL, LDLCALC, TRIG, CHOLHDL, LDLDIRECT in the last 72 hours. Thyroid Function Tests: No results for input(s): TSH, T4TOTAL, FREET4, T3FREE, THYROIDAB in the last 72 hours. Anemia Panel: No results for input(s): VITAMINB12, FOLATE, FERRITIN, TIBC, IRON, RETICCTPCT in the last 72 hours. Sepsis Labs: No results for input(s): PROCALCITON, LATICACIDVEN in the last 168 hours.  No results found for this or any previous visit (from the past 240 hour(s)).       Radiology Studies: Dg C-arm 1-60 Min  Result Date: 06/07/2017 CLINICAL DATA:   Right hip fracture fixation EXAM: DG C-ARM 61-120 MIN; OPERATIVE RIGHT HIP WITH PELVIS COMPARISON:  None. FINDINGS: There are 4 C-arm fluoroscopic views provided from a right femoral nail fixation of a comminuted intertrochanteric fracture of the proximal femur with avulsed lesser trochanter. Less varus angulation is currently identified on these images. No evidence of hardware failure or immediate postoperative fracture. Improved alignment is demonstrated though there is still some cephalad displacement of the femoral shaft relative to the femoral neck. IMPRESSION: Status post nail fixation of intertrochanteric comminuted fracture the right femur. Electronically Signed   By: Ashley Royalty M.D.   On: 06/07/2017 23:13   Dg Hip Operative Unilat W Or W/o Pelvis Right  Result Date: 06/07/2017 CLINICAL DATA:  Right hip fracture  fixation EXAM: DG C-ARM 61-120 MIN; OPERATIVE RIGHT HIP WITH PELVIS COMPARISON:  None. FINDINGS: There are 4 C-arm fluoroscopic views provided from a right femoral nail fixation of a comminuted intertrochanteric fracture of the proximal femur with avulsed lesser trochanter. Less varus angulation is currently identified on these images. No evidence of hardware failure or immediate postoperative fracture. Improved alignment is demonstrated though there is still some cephalad displacement of the femoral shaft relative to the femoral neck. IMPRESSION: Status post nail fixation of intertrochanteric comminuted fracture the right femur. Electronically Signed   By: Ashley Royalty M.D.   On: 06/07/2017 23:13        Scheduled Meds: . amLODipine  10 mg Oral Daily  . docusate sodium  100 mg Oral BID  . doxazosin  1 mg Oral QPM  . enoxaparin (LOVENOX) injection  30 mg Subcutaneous Q24H  . ferrous sulfate  325 mg Oral Q breakfast  . furosemide  40 mg Oral Daily  . hydrALAZINE  50 mg Oral TID  . isosorbide mononitrate  60 mg Oral Daily  . senna  1 tablet Oral BID   Continuous Infusions: .  sodium chloride    . sodium chloride 75 mL/hr at 06/09/17 0009  . lactated ringers 1 mL (06/07/17 2039)  . methocarbamol (ROBAXIN)  IV Stopped (06/09/17 0516)     LOS: 2 days    Time spent: >20min   Waldemar Dickens, MD [facility]   If 7PM-7AM, please contact night-coverage [email or web address] Password [email or web address] 06/09/2017, 2:08 PM

## 2017-06-09 NOTE — Progress Notes (Signed)
Subjective: 2 Days Post-Op Procedure(s) (LRB): INTRAMEDULLARY (IM) NAIL INTERTROCHANTRIC (Right)  Patient reports pain as mild to moderate.  Tolerating POs well.  Admits to flatus.  Denies fever, chills, N/V, SOB, CP.  Reports that he worked well with therapy yesterday.  Admits to feeling better today.  Objective:   VITALS:  Temp:  [98.1 F (36.7 C)-99 F (37.2 C)] 98.3 F (36.8 C) (10/23 0430) Pulse Rate:  [61-79] 66 (10/23 0430) Resp:  [15-18] 17 (10/23 0430) BP: (71-142)/(40-79) 138/76 (10/23 0430) SpO2:  [92 %-99 %] 92 % (10/23 0430) Weight:  [55.8 kg (123 lb 0.3 oz)] 55.8 kg (123 lb 0.3 oz) (10/23 0430)  General: WDWN patient in NAD. Psych:  Appropriate mood and affect. Neuro:  A&O x 3, Moving all extremities, sensation intact to light touch HEENT:  EOMs intact Chest:  Even non-labored respirations Skin:  Dressing C/D/I, no rashes or lesions Extremities: warm/dry, mild edema, no erythmea or echymosis.  No lymphadenopathy. Pulses: Popliteus 2+ MSK:  ROM: TKE, MMT: patient is able to perform quad set, (-) Homan's    LABS  Recent Labs  06/07/17 1022 06/08/17 0413 06/08/17 2147  HGB 9.0* 6.6* 8.8*  WBC 7.2 5.3  --   PLT 148* 122*  --     Recent Labs  06/08/17 0413 06/08/17 1550  NA 137 133*  K 5.5* 5.7*  CL 106 106  CO2 21* 18*  BUN 81* 85*  CREATININE 4.26* 4.85*  GLUCOSE 132* 114*   No results for input(s): LABPT, INR in the last 72 hours.   Assessment/Plan: 2 Days Post-Op Procedure(s) (LRB): INTRAMEDULLARY (IM) NAIL INTERTROCHANTRIC (Right)  WBAT R LE Up with therapy Due to compromised renal function at baseline ASA 81 mg BID for DVT prophylaxis at D/C Tramadol Rx for pain at D/C Scripts on chart Plan for 2 week outpatient post-op visit with Dr. Doran Durand.  Mechele Claude, PA-C [facility] Orthopaedics Office:  [phone]

## 2017-06-09 NOTE — Progress Notes (Signed)
53 Dr. Marily Memos notified of the latest lab results, HGB 7.9, BUN 85, CR 5.03.

## 2017-06-10 LAB — BASIC METABOLIC PANEL
ANION GAP: 11 (ref 5–15)
BUN: 84 mg/dL — ABNORMAL HIGH (ref 6–20)
CO2: 17 mmol/L — ABNORMAL LOW (ref 22–32)
Calcium: 8 mg/dL — ABNORMAL LOW (ref 8.9–10.3)
Chloride: 107 mmol/L (ref 101–111)
Creatinine, Ser: 4.95 mg/dL — ABNORMAL HIGH (ref 0.61–1.24)
GFR, EST AFRICAN AMERICAN: 10 mL/min — AB (ref >60)
GFR, EST NON AFRICAN AMERICAN: 9 mL/min — AB (ref >60)
GLUCOSE: 115 mg/dL — AB (ref 65–99)
POTASSIUM: 4.1 mmol/L (ref 3.5–5.1)
Sodium: 135 mmol/L (ref 135–145)

## 2017-06-10 LAB — CBC
HCT: 20.3 % — ABNORMAL LOW (ref 39.0–52.0)
HEMOGLOBIN: 7.1 g/dL — AB (ref 13.0–17.0)
MCH: 28.6 pg (ref 26.0–34.0)
MCHC: 35 g/dL (ref 30.0–36.0)
MCV: 81.9 fL (ref 78.0–100.0)
Platelets: 91 10*3/uL — ABNORMAL LOW (ref 150–400)
RBC: 2.48 MIL/uL — AB (ref 4.22–5.81)
RDW: 14.1 % (ref 11.5–15.5)
WBC: 5.8 10*3/uL (ref 4.0–10.5)

## 2017-06-10 LAB — PREPARE RBC (CROSSMATCH)

## 2017-06-10 MED ORDER — ACETAMINOPHEN 500 MG PO TABS
500.0000 mg | ORAL_TABLET | Freq: Once | ORAL | Status: AC
  Administered 2017-06-10: 500 mg via ORAL
  Filled 2017-06-10: qty 1

## 2017-06-10 MED ORDER — POLYETHYLENE GLYCOL 3350 17 G PO PACK
17.0000 g | PACK | Freq: Every day | ORAL | Status: DC
  Administered 2017-06-11: 17 g via ORAL
  Filled 2017-06-10: qty 1

## 2017-06-10 MED ORDER — DIPHENHYDRAMINE HCL 25 MG PO CAPS
25.0000 mg | ORAL_CAPSULE | Freq: Once | ORAL | Status: AC
  Administered 2017-06-10: 25 mg via ORAL
  Filled 2017-06-10: qty 1

## 2017-06-10 MED ORDER — SODIUM CHLORIDE 0.9 % IV SOLN
Freq: Once | INTRAVENOUS | Status: AC
  Administered 2017-06-10: 11:00:00 via INTRAVENOUS

## 2017-06-10 MED ORDER — FUROSEMIDE 10 MG/ML IJ SOLN
40.0000 mg | Freq: Once | INTRAMUSCULAR | Status: AC
  Administered 2017-06-10: 40 mg via INTRAVENOUS
  Filled 2017-06-10: qty 4

## 2017-06-10 NOTE — Progress Notes (Signed)
CSW met with patient and patient's wife at bedside to discuss SNF placement options and the decision that they had made on where they would want to admit. Patient's wife discussed that it would just be more convenient to be able to return to [name] for the patient to receive rehab. Patient's wife indicated that she had met with the rehab director this morning and received the paperwork that they needed to fill out.  CSW will continue to follow to facilitate discharge when medically ready.  Laveda Abbe, [place] Clinical Social Worker [phone]

## 2017-06-10 NOTE — Progress Notes (Signed)
Physical Therapy Treatment Patient Details Name: Johnny Morrow MRN: [id number] DOB: 1920-05-21 Today's Date: 06/10/2017    History of Present Illness Johnny Morrow is a 83 y.o. male with a past medical history significant for CKD IV baseline Cr 3.8-4.4, history of prostate cancer s/p resection, HTN, HFpEF, moderate AS, pAF not on AC due to recent large retroperitoneal bleed who presents with hip pain after a fall due to IT hip fx now s/p IM nail.    PT Comments    Patient progressing with bed mobility and ambulation short distance this session.  Feel he is improved with pain, but quickly fatigues and will need continued skilled PT in the acute setting prior to d/c to SNF level rehab.    Follow Up Recommendations  SNF     Equipment Recommendations  Rolling walker with 5" wheels    Recommendations for Other Services       Precautions / Restrictions Precautions Precautions: Fall Restrictions RLE Weight Bearing: Weight bearing as tolerated    Mobility  Bed Mobility Overal bed mobility: Needs Assistance Bed Mobility: Supine to Sit     Supine to sit: Mod assist;+2 for safety/equipment     General bed mobility comments: cues and assist to scoot hips to EOB, assist to lift trunk  Transfers Overall transfer level: Needs assistance Equipment used: Rolling walker (2 wheeled) Transfers: Sit to/from Stand Sit to Stand: Mod assist;+2 physical assistance         General transfer comment: lifting assist with walker, lowering help to chair  Ambulation/Gait Ambulation/Gait assistance: Mod assist Ambulation Distance (Feet): 3 Feet Assistive device: Rolling walker (2 wheeled) Gait Pattern/deviations: Step-to pattern;Trunk flexed;Wide base of support     General Gait Details: cues for sequence each time, assist to keep walker forward to prevent posterior LOB, assist for weight shift, fatigues in arms per pt   Stairs            Wheelchair Mobility    Modified Rankin (Stroke  Patients Only)       Balance Overall balance assessment: Needs assistance   Sitting balance-Leahy Scale: Poor Sitting balance - Comments: leans to R min a for balance   Standing balance support: Bilateral upper extremity supported Standing balance-Leahy Scale: Poor Standing balance comment: A with walker for balance                            Cognition Arousal/Alertness: Awake/alert Behavior During Therapy: Flat affect Overall Cognitive Status: Within Functional Limits for tasks assessed                                        Exercises Total Joint Exercises Ankle Circles/Pumps: AROM;Both;Supine;10 reps Short Arc Quad: AROM;Left;10 reps;Supine Heel Slides: AROM;AAROM;Both;Supine;10 reps Hip ABduction/ADduction: AAROM;10 reps;Supine;Right    General Comments General comments (skin integrity, edema, etc.): RN reports just s/p blood transfusion      Pertinent Vitals/Pain Pain Assessment: Faces Faces Pain Scale: Hurts little more Pain Location: R hip at rest, more with movement Pain Descriptors / Indicators: Discomfort;Grimacing;Guarding;Operative site guarding Pain Intervention(s): Monitored during session;Repositioned    Home Living                      Prior Function            PT Goals (current goals can now be found in the care  plan section) Progress towards PT goals: Progressing toward goals    Frequency    Min 3X/week      PT Plan Current plan remains appropriate    Co-evaluation              AM-PAC PT "6 Clicks" Daily Activity  Outcome Measure  Difficulty turning over in bed (including adjusting bedclothes, sheets and blankets)?: Unable Difficulty moving from lying on back to sitting on the side of the bed? : Unable Difficulty sitting down on and standing up from a chair with arms (e.g., wheelchair, bedside commode, etc,.)?: Unable Help needed moving to and from a bed to chair (including a wheelchair)?: A  Lot Help needed walking in hospital room?: A Lot Help needed climbing 3-5 steps with a railing? : Total 6 Click Score: 8    End of Session Equipment Utilized During Treatment: Gait belt Activity Tolerance: Patient limited by fatigue Patient left: in chair;with call bell/phone within reach;with chair alarm set   PT Visit Diagnosis: History of falling (Z91.81);Muscle weakness (generalized) (M62.81);Difficulty in walking, not elsewhere classified (R26.2);Pain Pain - Right/Left: Right Pain - part of body: Hip     Time: 4163-8453 PT Time Calculation (min) (ACUTE ONLY): 27 min  Charges:  $Therapeutic Exercise: 8-22 mins $Therapeutic Activity: 8-22 mins                    G CodesMagda Kiel, [place] [phone] 06/10/2017    Reginia Naas 06/10/2017, 2:15 PM

## 2017-06-10 NOTE — Progress Notes (Signed)
PROGRESS NOTE    Johnny Morrow  YDX:[id number] DOB: 26-Sep-1919 DOA: 06/07/2017 PCP: Wenda Low, MD   Specialists:   Dr. Harlow Ohms cardiology Dr. Salvatore Decent nephrology Dr. Marcene Corning Urology   Brief Narrative:  83 year old male Known history of a flutter--some intolerance to beta blockade secondary to bradycardia Stage IV CKD secondary to chronic obstructive uropathy versus hypertensive nephrosclerosis--baseline creatinine 4.0 as per chart 05/14/2017 Prostate cancer status post XRT 2001 and prostatectomy 1993  Part showed a left ureteric obstruction--offered retrograde pyelogram which patient deferred at urologist office Nonocclusive thrombus in left popliteal vein Moderate aortic stenosis Heart failure Severe retroperitoneal bleed setting of anticoagulation requiring multiple blood transfusions  Most recent admission 8 6-8/05/2017 with decompensated CHF--baseline weight 115 pounds  Admitted on 06/07/2017 with loss of balance without presyncope, radiograph showed comminuted right hip fracture-Dr. Doran Durand saw the patient and surgery was performed on 06/07/2017    Assessment & Plan:   Principal Problem:   Closed intertrochanteric fracture of hip, right, initial encounter [facility]) Active Problems:   Essential hypertension, benign   History of DVT of lower extremity, left   Chronic kidney disease (CKD), stage IV (severe) (HCC)   Normocytic anemia   AF (paroxysmal atrial fibrillation) (HCC)   Acute on chronic diastolic CHF (congestive heart failure) (HCC)   Hip Fracture: s/op surgery 10/21 - PT/OT recs SNF - Asa 81 bid AC - WBAT RLE - 2 week post-op Ortho visit   Acute on chronic diastolic CHF: mild at time of admission.  Diuresed with IV lasix Hold all lasix today  -Daily weights -Strict I/Os ? 1.5 liters + -Daily BMP  AoCKD IV-V: Cr 4.9. Baseline Cr 3.8. Followed by [facility].  Likely due to hypoperfusion from surgery and anemia w/ increased lasix dose  on day of admission.  - BMP shows Creat ~ 4.9, Baseline at [facility] earlier this year ~ 4 - Gentle IVF 75 cc/h--stop in 24 hr - would change to qod lasix on d/c home  Acute on chronic anemia: Hgb 8.6 after 2 units PRBC on 10/22. Hgb 9.0 on day of admission.  Hemoglobin now 7.1 - CBC am -needs 1 more unit PRBC 10/24 with lasix in between - Consider Epo but will defer to outpt renal  PAF: CHA2DS2-VASc = 5. Not an anticoagulation candidate due to bleeding issues in past. curretnly rate controlled. -  tele  HTN: - continue norvasc, imdur, hydralazine  Chronic DVT: popliteal veing thrombosis. Not an anticoagulation candidate.   Constipation Stop colace and dulcola--Change to miralax scheduled and senn   DVT prophylaxis: SCD and Heparin Code Status: Full Family Communication: d/w Daughter on phone and updated Disposition Plan:  inpatient   Consultants:  Dr. Doran Durand Orthopedics  Procedures:   Hip repair 10/21  Antimicrobials:    none    Subjective: Awake alert in nad eatign well No stool since Sunday No cp No fever no dizzy  No n/v  Objective: Vitals:   06/09/17 1428 06/09/17 1557 06/09/17 2057 06/10/17 0500  BP: (!) 162/65 140/67 (!) 144/70 (!) 156/75  Pulse:   76 73  Resp:    17  Temp:   99.3 F (37.4 C) 98.8 F (37.1 C)  TempSrc:      SpO2:   91% 93%  Weight:    56.3 kg (124 lb 1.9 oz)  Height:        Intake/Output Summary (Last 24 hours) at 06/10/17 0834 Last data filed at 06/10/17 0818  Gross per 24 hour  Intake  1395 ml  Output             1055 ml  Net              340 ml   Filed Weights   06/07/17 1008 06/09/17 0430 06/10/17 0500  Weight: 56.2 kg (124 lb) 55.8 kg (123 lb 0.3 oz) 56.3 kg (124 lb 1.9 oz)    Examination:  Younger than stated age Arcus senilis, mouth full of food No jvd 5/6 HSM irreg uirreg abd soft nt nd no rebound no garuding Staples on Lateral side R leg, no overt swelling nor hematoma Power 5/5 bilat  movement intact, smile symm  Data Reviewed: I have personally reviewed following labs and imaging studies  CBC:  Recent Labs Lab 06/07/17 1022 06/08/17 0413 06/08/17 2147 06/09/17 0737 06/09/17 1512 06/10/17 0247  WBC 7.2 5.3  --  7.7 6.5 5.8  NEUTROABS 5.4  --   --   --   --   --   HGB 9.0* 6.6* 8.8* 8.6* 7.9* 7.1*  HCT 25.6* 18.6* 25.2* 24.6* 23.1* 20.3*  MCV 80.3 80.5  --  81.2 81.6 81.9  PLT 148* 122*  --  95* 95* 91*   Basic Metabolic Panel:  Recent Labs Lab 06/08/17 0413 06/08/17 1550 06/09/17 0737 06/09/17 1512 06/10/17 0247  NA 137 133* 136 135 135  K 5.5* 5.7* 4.8 4.6 4.1  CL 106 106 108 110 107  CO2 21* 18* 19* 16* 17*  GLUCOSE 132* 114* 101* 141* 115*  BUN 81* 85* 85* 85* 84*  CREATININE 4.26* 4.85* 4.93* 5.03* 4.95*  CALCIUM 8.3* 8.2* 8.5* 8.1* 8.0*   GFR: Estimated Creatinine Clearance: 7.3 mL/min (A) (by C-G formula based on SCr of 4.95 mg/dL (H)). Liver Function Tests:  Recent Labs Lab 06/07/17 1022  AST 30  ALT 18  ALKPHOS 62  BILITOT 0.9  PROT 6.6  ALBUMIN 3.3*   No results for input(s): LIPASE, AMYLASE in the last 168 hours. No results for input(s): AMMONIA in the last 168 hours. Coagulation Profile: No results for input(s): INR, PROTIME in the last 168 hours. Cardiac Enzymes: No results for input(s): CKTOTAL, CKMB, CKMBINDEX, TROPONINI in the last 168 hours. BNP (last 3 results)  Recent Labs  08/20/16 1221 12/22/16 1453  PROBNP 373.0* 999.0*   HbA1C: No results for input(s): HGBA1C in the last 72 hours. CBG: No results for input(s): GLUCAP in the last 168 hours. Lipid Profile: No results for input(s): CHOL, HDL, LDLCALC, TRIG, CHOLHDL, LDLDIRECT in the last 72 hours. Thyroid Function Tests: No results for input(s): TSH, T4TOTAL, FREET4, T3FREE, THYROIDAB in the last 72 hours. Anemia Panel: No results for input(s): VITAMINB12, FOLATE, FERRITIN, TIBC, IRON, RETICCTPCT in the last 72 hours. Urine analysis:    Component  Value Date/Time   COLORURINE YELLOW 06/07/2017 [place] 06/07/2017 1313   LABSPEC 1.013 06/07/2017 1313   PHURINE 5.0 06/07/2017 1313   GLUCOSEU NEGATIVE 06/07/2017 1313   HGBUR NEGATIVE 06/07/2017 1313   BILIRUBINUR NEGATIVE 06/07/2017 1313   KETONESUR NEGATIVE 06/07/2017 1313   PROTEINUR 100 (A) 06/07/2017 1313   NITRITE NEGATIVE 06/07/2017 1313   LEUKOCYTESUR NEGATIVE 06/07/2017 1313     Radiology Studies: Reviewed images personally in health database    Scheduled Meds: . acetaminophen  500 mg Oral Once  . amLODipine  10 mg Oral Daily  . diphenhydrAMINE  25 mg Oral Once  . docusate sodium  100 mg Oral BID  . doxazosin  1  mg Oral QPM  . enoxaparin (LOVENOX) injection  30 mg Subcutaneous Q24H  . ferrous sulfate  325 mg Oral Q breakfast  . furosemide  40 mg Intravenous Once  . hydrALAZINE  50 mg Oral TID  . isosorbide mononitrate  60 mg Oral Daily  . senna  1 tablet Oral BID   Continuous Infusions: . sodium chloride 75 mL/hr at 06/09/17 1510  . sodium chloride    . methocarbamol (ROBAXIN)  IV 500 mg (06/10/17 0713)     LOS: 3 days    Time spent: [place], MD [facility]   If 7PM-7AM, please contact night-coverage [email or web address] Password [facility] 06/10/2017, 8:34 AM

## 2017-06-10 NOTE — Progress Notes (Addendum)
Subjective: 3 Days Post-Op Procedure(s) (LRB): INTRAMEDULLARY (IM) NAIL INTERTROCHANTRIC (Right)  Patient reports pain as mild to moderate.  Resting comfortably in bed this morning.  States that he worked well with therapy.  Tolerating POs well.  Admits to flatus.  Denies fever, chills, N/V, SOB, CP.  Objective:   VITALS:  Temp:  [98.8 F (37.1 C)-99.3 F (37.4 C)] 98.8 F (37.1 C) (10/24 0500) Pulse Rate:  [65-76] 73 (10/24 0500) Resp:  [17-18] 17 (10/24 0500) BP: (108-162)/(56-75) 156/75 (10/24 0500) SpO2:  [91 %-95 %] 93 % (10/24 0500) Weight:  [56.3 kg (124 lb 1.9 oz)] 56.3 kg (124 lb 1.9 oz) (10/24 0500)  General: WDWN patient in NAD. Psych:  Appropriate mood and affect. Neuro:  A&O x 3, Moving all extremities, sensation intact to light touch HEENT:  EOMs intact Chest:  Even non-labored respirations Skin: Dressing C/D/I, no rashes or lesions Extremities: warm/dry, mild edema, no erythema or echymosis.  No lymphadenopathy. Pulses: Popliteus 2+ MSK:  ROM: TKE, MMT: able to perform quad set, (-) Homan's    LABS  Recent Labs  06/08/17 0413 06/08/17 2147 06/09/17 0737 06/09/17 1512 06/10/17 0247  HGB 6.6* 8.8* 8.6* 7.9* 7.1*  WBC 5.3  --  7.7 6.5 5.8  PLT 122*  --  95* 95* 91*    Recent Labs  06/09/17 1512 06/10/17 0247  NA 135 135  K 4.6 4.1  CL 110 107  CO2 16* 17*  BUN 85* 84*  CREATININE 5.03* 4.95*  GLUCOSE 141* 115*   No results for input(s): LABPT, INR in the last 72 hours.   Assessment/Plan: 3 Days Post-Op Procedure(s) (LRB): INTRAMEDULLARY (IM) NAIL INTERTROCHANTRIC (Right)  WBAT R LE Up with therapy Disposition: D/C to SNF when ready. DVT prophylaxis upon D/C - ASA 81 mg BID due to compromised renal function Tramadol Rx for pain.   Scripts on chart Orders placed in D/C instructions Plan for 2 week outpatient post-op visit with Dr. Doran Durand. Ortho signing off.  Mechele Claude, PA-C [facility] Orthopaedics Office:  [phone]

## 2017-06-10 NOTE — Progress Notes (Signed)
Progress Note  Patient Name: Johnny Morrow Date of Encounter: 06/10/2017  Primary Cardiologist:  Harrington Challenger (seen over 1 1/2 year ago)  Subjective   No Cp  Breathing a little short  Inpatient Medications    Scheduled Meds: . amLODipine  10 mg Oral Daily  . doxazosin  1 mg Oral QPM  . enoxaparin (LOVENOX) injection  30 mg Subcutaneous Q24H  . ferrous sulfate  325 mg Oral Q breakfast  . furosemide  40 mg Intravenous Once  . hydrALAZINE  50 mg Oral TID  . isosorbide mononitrate  60 mg Oral Daily  . polyethylene glycol  17 g Oral Daily  . senna  1 tablet Oral BID   Continuous Infusions: . methocarbamol (ROBAXIN)  IV Stopped (06/10/17 0743)   PRN Meds: acetaminophen **OR** acetaminophen, HYDROcodone-acetaminophen, menthol-cetylpyridinium **OR** phenol, methocarbamol (ROBAXIN)  IV, morphine injection, ondansetron **OR** ondansetron (ZOFRAN) IV, oxyCODONE   Vital Signs    Vitals:   06/10/17 0500 06/10/17 0815 06/10/17 1044 06/10/17 1102  BP: (!) 156/75 122/60 (!) 114/48 (!) 124/53  Pulse: 73  (!) 58 (!) 59  Resp: 17     Temp: 98.8 F (37.1 C)  98.9 F (37.2 C) 99.5 F (37.5 C)  TempSrc:   Oral Oral  SpO2: 93%  95% 96%  Weight: 124 lb 1.9 oz (56.3 kg)     Height:        Intake/Output Summary (Last 24 hours) at 06/10/17 1110 Last data filed at 06/10/17 1104  Gross per 24 hour  Intake             1155 ml  Output             1155 ml  Net                0 ml   Filed Weights   06/07/17 1008 06/09/17 0430 06/10/17 0500  Weight: 124 lb (56.2 kg) 123 lb 0.3 oz (55.8 kg) 124 lb 1.9 oz (56.3 kg)    Telemetry    SR   - Personally Reviewed  ECG      Physical Exam   GEN: No acute distress.  Pt sleepy Neck: JVP is increased   Cardiac: RRR, Gr III/VI systolic murmur base  No rubs, or gallops.  Respiratory: Rales at bases GI: Soft, nontender, non-distended  MS: No edema; Neuro:  Nonfocal    Labs    Chemistry Recent Labs Lab 06/07/17 1022  06/09/17 0737  06/09/17 1512 06/10/17 0247  NA 134*  < > 136 135 135  K 4.5  < > 4.8 4.6 4.1  CL 107  < > 108 110 107  CO2 17*  < > 19* 16* 17*  GLUCOSE 133*  < > 101* 141* 115*  BUN 76*  < > 85* 85* 84*  CREATININE 3.73*  < > 4.93* 5.03* 4.95*  CALCIUM 8.8*  < > 8.5* 8.1* 8.0*  PROT 6.6  --   --   --   --   ALBUMIN 3.3*  --   --   --   --   AST 30  --   --   --   --   ALT 18  --   --   --   --   ALKPHOS 62  --   --   --   --   BILITOT 0.9  --   --   --   --   GFRNONAA 13*  < > 9* 9* 9*  GFRAA 15*  < > 10* 10* 10*  ANIONGAP 10  < > 9 9 11   < > = values in this interval not displayed.   Hematology  Recent Labs Lab 06/09/17 0737 06/09/17 1512 06/10/17 0247  WBC 7.7 6.5 5.8  RBC 3.03* 2.83* 2.48*  HGB 8.6* 7.9* 7.1*  HCT 24.6* 23.1* 20.3*  MCV 81.2 81.6 81.9  MCH 28.4 27.9 28.6  MCHC 35.0 34.2 35.0  RDW 14.4 14.1 14.1  PLT 95* 95* 91*    Cardiac EnzymesNo results for input(s): TROPONINI in the last 168 hours. No results for input(s): TROPIPOC in the last 168 hours.   BNP  Recent Labs Lab 06/07/17 1022  BNP 409.3*     DDimer No results for input(s): DDIMER in the last 168 hours.   Radiology    No results found.  Cardiac Studies     Patient Profile       Assessment & Plan    1  Hx of atrial flutter  Remains in SR  With PACs  No anticoag   2  Mod AS  Follow  Agree with tx  Watch I/Os    3  Hx chronic diastolic CHF Volume appears increased from yesterday  Note lasix given  But Cr remains up    May need additional Lasix prn if getting transfusion    Follow exam    4  HTN  BP is OK  Continue meds    5  Renal  BUN/Ctr  Note Cr increased from 2 days ago    6  Anemia  Plan for Tx today  Will pull back on IV fluids since getting blood soon      For questions or updates, please contact [place] Please consult [email or web address] for contact info under Cardiology/STEMI.      Signed, Dorris Carnes, MD  06/10/2017, 11:10 AM

## 2017-06-11 LAB — BPAM RBC
BLOOD PRODUCT EXPIRATION DATE: 201811122359
Blood Product Expiration Date: 201811022359
Blood Product Expiration Date: 201811122359
ISSUE DATE / TIME: 201810220633
ISSUE DATE / TIME: 201810221615
ISSUE DATE / TIME: 201810241029
UNIT TYPE AND RH: 8400
Unit Type and Rh: 6200
Unit Type and Rh: 8400

## 2017-06-11 LAB — BASIC METABOLIC PANEL
ANION GAP: 12 (ref 5–15)
BUN: 88 mg/dL — ABNORMAL HIGH (ref 6–20)
CALCIUM: 8.2 mg/dL — AB (ref 8.9–10.3)
CHLORIDE: 108 mmol/L (ref 101–111)
CO2: 17 mmol/L — AB (ref 22–32)
CREATININE: 5.15 mg/dL — AB (ref 0.61–1.24)
GFR calc Af Amer: 10 mL/min — ABNORMAL LOW (ref >60)
GFR calc non Af Amer: 9 mL/min — ABNORMAL LOW (ref >60)
GLUCOSE: 104 mg/dL — AB (ref 65–99)
Potassium: 3.6 mmol/L (ref 3.5–5.1)
Sodium: 137 mmol/L (ref 135–145)

## 2017-06-11 LAB — RETICULOCYTES
RBC.: 2.81 MIL/uL — ABNORMAL LOW (ref 4.22–5.81)
RETIC COUNT ABSOLUTE: 73.1 10*3/uL (ref 19.0–186.0)
Retic Ct Pct: 2.6 % (ref 0.4–3.1)

## 2017-06-11 LAB — TYPE AND SCREEN
ABO/RH(D): AB POS
Antibody Screen: NEGATIVE
UNIT DIVISION: 0
UNIT DIVISION: 0
Unit division: 0

## 2017-06-11 LAB — CBC WITH DIFFERENTIAL/PLATELET
Basophils Absolute: 0 10*3/uL (ref 0.0–0.1)
Basophils Relative: 0 %
Eosinophils Absolute: 0.2 10*3/uL (ref 0.0–0.7)
Eosinophils Relative: 5 %
HEMATOCRIT: 22.3 % — AB (ref 39.0–52.0)
HEMOGLOBIN: 7.9 g/dL — AB (ref 13.0–17.0)
LYMPHS ABS: 1 10*3/uL (ref 0.7–4.0)
LYMPHS PCT: 21 %
MCH: 28.6 pg (ref 26.0–34.0)
MCHC: 35.4 g/dL (ref 30.0–36.0)
MCV: 80.8 fL (ref 78.0–100.0)
MONO ABS: 0.6 10*3/uL (ref 0.1–1.0)
MONOS PCT: 13 %
NEUTROS ABS: 3 10*3/uL (ref 1.7–7.7)
NEUTROS PCT: 62 %
Platelets: 89 10*3/uL — ABNORMAL LOW (ref 150–400)
RBC: 2.76 MIL/uL — ABNORMAL LOW (ref 4.22–5.81)
RDW: 14.5 % (ref 11.5–15.5)
WBC: 4.9 10*3/uL (ref 4.0–10.5)

## 2017-06-11 LAB — IRON AND TIBC
IRON: 18 ug/dL — AB (ref 45–182)
SATURATION RATIOS: 10 % — AB (ref 17.9–39.5)
TIBC: 174 ug/dL — ABNORMAL LOW (ref 250–450)
UIBC: 156 ug/dL

## 2017-06-11 LAB — FOLATE: FOLATE: 32 ng/mL (ref >5.9)

## 2017-06-11 LAB — VITAMIN B12: Vitamin B-12: 858 pg/mL (ref 180–914)

## 2017-06-11 LAB — FERRITIN: FERRITIN: 332 ng/mL (ref 24–336)

## 2017-06-11 MED ORDER — BISACODYL 5 MG PO TBEC
5.0000 mg | DELAYED_RELEASE_TABLET | Freq: Every day | ORAL | Status: DC | PRN
  Administered 2017-06-11: 5 mg via ORAL
  Filled 2017-06-11: qty 1

## 2017-06-11 MED ORDER — SODIUM CHLORIDE 0.9 % IV SOLN
125.0000 mg | Freq: Once | INTRAVENOUS | Status: AC
  Administered 2017-06-11: 125 mg via INTRAVENOUS
  Filled 2017-06-11: qty 10

## 2017-06-11 NOTE — Progress Notes (Signed)
PROGRESS NOTE    Johnny Morrow  YIR:[id number] DOB: Aug 19, 1919 DOA: 06/07/2017 PCP: Wenda Low, MD   Specialists:   Dr. Harlow Ohms cardiology Dr. Salvatore Decent nephrology Dr. Marcene Corning Urology   Brief Narrative:   83 year old male Known history of a flutter--some intolerance to beta blockade secondary to bradycardia Stage IV CKD secondary to chronic obstructive uropathy versus hypertensive nephrosclerosis--baseline creatinine 4.0 as per chart 05/14/2017 Prostate cancer status post XRT 2001 and prostatectomy 1993  Part showed a left ureteric obstruction--offered retrograde pyelogram which patient deferred at urologist office Nonocclusive thrombus in left popliteal vein Moderate aortic stenosis Heart failure Severe retroperitoneal bleed setting of anticoagulation requiring multiple blood transfusions  Most recent admission 8 6-8/05/2017 with decompensated CHF--baseline weight 115 pounds  Admitted on 06/07/2017 with loss of balance without presyncope, radiograph showed comminuted right hip fracture-Dr. Doran Durand saw the patient and surgery was performed on 06/07/2017    Assessment & Plan:   Principal Problem:   Closed intertrochanteric fracture of hip, right, initial encounter [facility]) Active Problems:   Essential hypertension, benign   History of DVT of lower extremity, left   Chronic kidney disease (CKD), stage IV (severe) (HCC)   Normocytic anemia   AF (paroxysmal atrial fibrillation) (HCC)   Acute on chronic diastolic CHF (congestive heart failure) (HCC)   Hip Fracture: s/op surgery 10/21 - PT/OT recs SNF - Asa 81 bid AC - WBAT RLE - 2 week post-op Ortho visit   Acute on chronic diastolic CHF: mild at time of admission.  Diuresed with IV lasix initally Holding all lasix today  -Daily weights-weight stable and 55-56 pound range -Strict I/Os is 1 L so far  AoCKD IV-V: Cr 4.9. Baseline Cr 3.8. Followed by [facility].  Likely due to hypoperfusion from surgery  and anemia w/ increased lasix dose on day of admission.  - BMP shows Creat ~ 4.9, Baseline at [facility] earlier this year ~ 4 - Gentle IVF 75 cc/h--stop in 24 hr--agree with cardiology keeping volumes even without Lasix or fluids on 10/25 -If worsening creatinine will need to discuss with nephrology--patient is producing urine if stabilizes may be able to have him follow-up with outpatient nephrologist--nephrologist t has mentioned in the past that he does not think the patient would benefit from dialysis  Acute on chronic anemia: Hgb 8.6 after 2 units PRBC on 10/22. Hgb 9.0 on day of admission.  Hemoglobin now 7.1 - 1 more unit PRBC 10/24 with lasix in between--hemoglobin now 7.9 - Get iron stores and consider either IV iron or EPO this admission if worse  PAF: CHA2DS2-VASc = 5. Not an anticoagulation candidate due to bleeding issues in past. currently rate controlled. -  tele  HTN: - continue norvasc, imdur, hydralazine  Chronic DVT: popliteal veing thrombosis. Not an anticoagulation candidate.   Constipation Stop colace and dulcola--Change to miralax scheduled and senna Add Fleets enema if no reuslts today   DVT prophylaxis: SCD and Heparin Code Status: Full Family Communication: d/w Daughter on phone and updated Disposition Plan:  inpatient   Consultants:  Dr. Doran Durand Orthopedics  Procedures:   Hip repair 10/21  Antimicrobials:    none    Subjective:  A little sleepy today but overall fine Not short of breath no chills passing reasonable amounts of urine No chest pain No nausea No vomiting Wife concerned does not really eating  Objective: Vitals:   06/10/17 1102 06/10/17 1406 06/10/17 2212 06/11/17 0511  BP: (!) 124/53 (!) 121/57 (!) 147/61 (!) 136/56  Pulse: Marland Kitchen)  59 65 62 93  Resp:  15 16   Temp: 99.5 F (37.5 C) 98 F (36.7 C) 99.8 F (37.7 C) 99.5 F (37.5 C)  TempSrc: Oral Oral Oral Oral  SpO2: 96% 96% 95% 93%  Weight:      Height:         Intake/Output Summary (Last 24 hours) at 06/11/17 1025 Last data filed at 06/11/17 0950  Gross per 24 hour  Intake              455 ml  Output              900 ml  Net             -445 ml   Filed Weights   06/07/17 1008 06/09/17 0430 06/10/17 0500  Weight: 56.2 kg (124 lb) 55.8 kg (123 lb 0.3 oz) 56.3 kg (124 lb 1.9 oz)    Examination:  Younger than stated age Arcus senilis, no distress no icterus No jvd no bruit 5/6 HSM irreg uirreg abd soft nt nd  Lower extremity wounds not examined today Power 5/5 bilat movement intact, smile symm  Data Reviewed: I have personally reviewed following labs and imaging studies  CBC:  Recent Labs Lab 06/07/17 1022 06/08/17 0413 06/08/17 2147 06/09/17 0737 06/09/17 1512 06/10/17 0247 06/11/17 0434  WBC 7.2 5.3  --  7.7 6.5 5.8 4.9  NEUTROABS 5.4  --   --   --   --   --  3.0  HGB 9.0* 6.6* 8.8* 8.6* 7.9* 7.1* 7.9*  HCT 25.6* 18.6* 25.2* 24.6* 23.1* 20.3* 22.3*  MCV 80.3 80.5  --  81.2 81.6 81.9 80.8  PLT 148* 122*  --  95* 95* 91* 89*   Basic Metabolic Panel:  Recent Labs Lab 06/08/17 1550 06/09/17 0737 06/09/17 1512 06/10/17 0247 06/11/17 0434  NA 133* 136 135 135 137  K 5.7* 4.8 4.6 4.1 3.6  CL 106 108 110 107 108  CO2 18* 19* 16* 17* 17*  GLUCOSE 114* 101* 141* 115* 104*  BUN 85* 85* 85* 84* 88*  CREATININE 4.85* 4.93* 5.03* 4.95* 5.15*  CALCIUM 8.2* 8.5* 8.1* 8.0* 8.2*   GFR: Estimated Creatinine Clearance: 7 mL/min (A) (by C-G formula based on SCr of 5.15 mg/dL (H)). Liver Function Tests:  Recent Labs Lab 06/07/17 1022  AST 30  ALT 18  ALKPHOS 62  BILITOT 0.9  PROT 6.6  ALBUMIN 3.3*   No results for input(s): LIPASE, AMYLASE in the last 168 hours. No results for input(s): AMMONIA in the last 168 hours. Coagulation Profile: No results for input(s): INR, PROTIME in the last 168 hours. Cardiac Enzymes: No results for input(s): CKTOTAL, CKMB, CKMBINDEX, TROPONINI in the last 168 hours. BNP (last 3  results)  Recent Labs  08/20/16 1221 12/22/16 1453  PROBNP 373.0* 999.0*   HbA1C: No results for input(s): HGBA1C in the last 72 hours. CBG: No results for input(s): GLUCAP in the last 168 hours. Lipid Profile: No results for input(s): CHOL, HDL, LDLCALC, TRIG, CHOLHDL, LDLDIRECT in the last 72 hours. Thyroid Function Tests: No results for input(s): TSH, T4TOTAL, FREET4, T3FREE, THYROIDAB in the last 72 hours. Anemia Panel: No results for input(s): VITAMINB12, FOLATE, FERRITIN, TIBC, IRON, RETICCTPCT in the last 72 hours. Urine analysis:    Component Value Date/Time   COLORURINE YELLOW 06/07/2017 [place] 06/07/2017 1313   LABSPEC 1.013 06/07/2017 1313   PHURINE 5.0 06/07/2017 1313   [place] 06/07/2017 1313  HGBUR NEGATIVE 06/07/2017 1313   BILIRUBINUR NEGATIVE 06/07/2017 [place] 06/07/2017 1313   PROTEINUR 100 (A) 06/07/2017 1313   NITRITE NEGATIVE 06/07/2017 [place] 06/07/2017 1313     Radiology Studies: Reviewed images personally in health database    Scheduled Meds: . amLODipine  10 mg Oral Daily  . doxazosin  1 mg Oral QPM  . enoxaparin (LOVENOX) injection  30 mg Subcutaneous Q24H  . ferrous sulfate  325 mg Oral Q breakfast  . hydrALAZINE  50 mg Oral TID  . isosorbide mononitrate  60 mg Oral Daily  . polyethylene glycol  17 g Oral Daily  . senna  1 tablet Oral BID   Continuous Infusions: . methocarbamol (ROBAXIN)  IV Stopped (06/10/17 0743)     LOS: 4 days    Time spent: [place], MD [facility]   If 7PM-7AM, please contact night-coverage [email or web address] Password [email or web address] 06/11/2017, 10:25 AM

## 2017-06-11 NOTE — Progress Notes (Signed)
Progress Note  Patient Name: Johnny Morrow Date of Encounter: 06/11/2017  Primary Cardiologist:  Harrington Challenger (seen over 1 1/2 year ago)  Subjective   Breathing is OK  No CP    Inpatient Medications    Scheduled Meds: . amLODipine  10 mg Oral Daily  . doxazosin  1 mg Oral QPM  . enoxaparin (LOVENOX) injection  30 mg Subcutaneous Q24H  . ferrous sulfate  325 mg Oral Q breakfast  . hydrALAZINE  50 mg Oral TID  . isosorbide mononitrate  60 mg Oral Daily  . polyethylene glycol  17 g Oral Daily  . senna  1 tablet Oral BID   Continuous Infusions: . methocarbamol (ROBAXIN)  IV Stopped (06/10/17 0743)   PRN Meds: acetaminophen **OR** acetaminophen, HYDROcodone-acetaminophen, menthol-cetylpyridinium **OR** phenol, methocarbamol (ROBAXIN)  IV, morphine injection, ondansetron **OR** ondansetron (ZOFRAN) IV, oxyCODONE   Vital Signs    Vitals:   06/10/17 1102 06/10/17 1406 06/10/17 2212 06/11/17 0511  BP: (!) 124/53 (!) 121/57 (!) 147/61 (!) 136/56  Pulse: (!) 59 65 62 93  Resp:  15 16   Temp: 99.5 F (37.5 C) 98 F (36.7 C) 99.8 F (37.7 C) 99.5 F (37.5 C)  TempSrc: Oral Oral Oral Oral  SpO2: 96% 96% 95% 93%  Weight:      Height:        Intake/Output Summary (Last 24 hours) at 06/11/17 0947 Last data filed at 06/11/17 0727  Gross per 24 hour  Intake              335 ml  Output              800 ml  Net             -465 ml   Filed Weights   06/07/17 1008 06/09/17 0430 06/10/17 0500  Weight: 124 lb (56.2 kg) 123 lb 0.3 oz (55.8 kg) 124 lb 1.9 oz (56.3 kg)    Telemetry     SR   - Personally Reviewed  ECG      Physical Exam   GEN: No acute distress.  Pt sleepy Cardiac: RRR, Gr III/VI systolic murmur base  No rubs, or gallops.  Respiratory: Rel clear   GI: Soft, nontender, non-distended  MS: No edema; Neuro:  Nonfocal    Labs    Chemistry Recent Labs Lab 06/07/17 1022  06/09/17 1512 06/10/17 0247 06/11/17 0434  NA 134*  < > 135 135 137  K 4.5  < > 4.6  4.1 3.6  CL 107  < > 110 107 108  CO2 17*  < > 16* 17* 17*  GLUCOSE 133*  < > 141* 115* 104*  BUN 76*  < > 85* 84* 88*  CREATININE 3.73*  < > 5.03* 4.95* 5.15*  CALCIUM 8.8*  < > 8.1* 8.0* 8.2*  PROT 6.6  --   --   --   --   ALBUMIN 3.3*  --   --   --   --   AST 30  --   --   --   --   ALT 18  --   --   --   --   ALKPHOS 62  --   --   --   --   BILITOT 0.9  --   --   --   --   GFRNONAA 13*  < > 9* 9* 9*  GFRAA 15*  < > 10* 10* 10*  ANIONGAP 10  < >  9 11 12   < > = values in this interval not displayed.   Hematology  Recent Labs Lab 06/09/17 1512 06/10/17 0247 06/11/17 0434  WBC 6.5 5.8 4.9  RBC 2.83* 2.48* 2.76*  HGB 7.9* 7.1* 7.9*  HCT 23.1* 20.3* 22.3*  MCV 81.6 81.9 80.8  MCH 27.9 28.6 28.6  MCHC 34.2 35.0 35.4  RDW 14.1 14.1 14.5  PLT 95* 91* 89*    Cardiac EnzymesNo results for input(s): TROPONINI in the last 168 hours. No results for input(s): TROPIPOC in the last 168 hours.   BNP  Recent Labs Lab 06/07/17 1022  BNP 409.3*     DDimer No results for input(s): DDIMER in the last 168 hours.   Radiology    No results found.  Cardiac Studies     Patient Profile       Assessment & Plan    1  Hx of atrial flutter    Remains in SR    2  Mod AS  Follow  Agree with tx  Watch I/Os    3  Hx chronic diastolic CHF His breathing appears a little easier than yesterday  Overall Rx limited by renal function which is declining   I would let him autoequilibrate  Follow  Avoid Na  (2G NA diet)   4  HTN  BP is OK    5  Renal  BUN/Ctr   Cr increased today    6  Anemia    Hg7.9  Plt 89   On Fe  Consider protonix      For questions or updates, please contact [facility] Please consult [email or web address] for contact info under Cardiology/STEMI.      Signed, Dorris Carnes, MD  06/11/2017, 9:47 AM

## 2017-06-12 DIAGNOSIS — M62551 Muscle wasting and atrophy, not elsewhere classified, right thigh: Secondary | ICD-10-CM | POA: Diagnosis not present

## 2017-06-12 DIAGNOSIS — R278 Other lack of coordination: Secondary | ICD-10-CM | POA: Diagnosis not present

## 2017-06-12 DIAGNOSIS — M84451S Pathological fracture, right femur, sequela: Secondary | ICD-10-CM | POA: Diagnosis not present

## 2017-06-12 DIAGNOSIS — R2689 Other abnormalities of gait and mobility: Secondary | ICD-10-CM | POA: Diagnosis not present

## 2017-06-12 DIAGNOSIS — Z4789 Encounter for other orthopedic aftercare: Secondary | ICD-10-CM | POA: Diagnosis not present

## 2017-06-12 DIAGNOSIS — Z9181 History of falling: Secondary | ICD-10-CM | POA: Diagnosis not present

## 2017-06-12 LAB — RENAL FUNCTION PANEL
ANION GAP: 10 (ref 5–15)
Albumin: 2.4 g/dL — ABNORMAL LOW (ref 3.5–5.0)
BUN: 91 mg/dL — AB (ref 6–20)
CHLORIDE: 108 mmol/L (ref 101–111)
CO2: 18 mmol/L — ABNORMAL LOW (ref 22–32)
Calcium: 8.4 mg/dL — ABNORMAL LOW (ref 8.9–10.3)
Creatinine, Ser: 4.86 mg/dL — ABNORMAL HIGH (ref 0.61–1.24)
GFR calc non Af Amer: 9 mL/min — ABNORMAL LOW (ref >60)
GFR, EST AFRICAN AMERICAN: 11 mL/min — AB (ref >60)
Glucose, Bld: 124 mg/dL — ABNORMAL HIGH (ref 65–99)
POTASSIUM: 3.6 mmol/L (ref 3.5–5.1)
Phosphorus: 3.6 mg/dL (ref 2.5–4.6)
SODIUM: 136 mmol/L (ref 135–145)

## 2017-06-12 LAB — CBC
HEMATOCRIT: 22.3 % — AB (ref 39.0–52.0)
HEMOGLOBIN: 7.9 g/dL — AB (ref 13.0–17.0)
MCH: 28.8 pg (ref 26.0–34.0)
MCHC: 35.4 g/dL (ref 30.0–36.0)
MCV: 81.4 fL (ref 78.0–100.0)
Platelets: 100 10*3/uL — ABNORMAL LOW (ref 150–400)
RBC: 2.74 MIL/uL — AB (ref 4.22–5.81)
RDW: 14.1 % (ref 11.5–15.5)
WBC: 4.9 10*3/uL (ref 4.0–10.5)

## 2017-06-12 MED ORDER — BISACODYL 5 MG PO TBEC: 5.0000 mg | DELAYED_RELEASE_TABLET | Freq: Every day | ORAL | 0 refills | Status: AC | PRN

## 2017-06-12 MED ORDER — POLYETHYLENE GLYCOL 3350 17 G PO PACK: 17.0000 g | PACK | Freq: Every day | ORAL | 0 refills | Status: AC

## 2017-06-12 NOTE — Clinical Social Work Placement (Signed)
   CLINICAL SOCIAL WORK PLACEMENT  NOTE  Date:  06/12/2017  Patient Details  Name: Johnny Morrow MRN: [id number] Date of Birth: [birth year]  Clinical Social Work is seeking post-discharge placement for this patient at the [place] level of care (*CSW will initial, date and re-position this form in  chart as items are completed):  Yes   Patient/family provided with [facility]'s list of facilities offering this level of care within the geographic area requested by the patient (or if unable, by the patient's family).  Yes   Patient/family informed of their freedom to choose among providers that offer the needed level of care, that participate in Medicare, Medicaid or managed care program needed by the patient, have an available bed and are willing to accept the patient.  Yes   Patient/family informed of [facility]'s ownership interest in [facility] and [facility], as well as of the fact that they are under no obligation to receive care at these facilities.  PASRR submitted to EDS on       PASRR number received on       Existing PASRR number confirmed on 06/10/17     FL2 transmitted to all facilities in geographic area requested by pt/family on 06/10/17     FL2 transmitted to all facilities within larger geographic area on       Patient informed that his/her managed care company has contracts with or will negotiate with certain facilities, including the following:        Yes   Patient/family informed of bed offers received.  Patient chooses bed at [facility]     Physician recommends and patient chooses bed at      Patient to be transferred to [facility] on 06/12/17.  Patient to be transferred to facility by Ambulance     Patient family notified on 06/12/17 of transfer.  Name of family member notified:  Patient wife at bedside     PHYSICIAN       Additional Comment:   Barbette Or, [place]

## 2017-06-12 NOTE — Care Management Important Message (Signed)
Important Message  Patient Details  Name: Johnny Morrow MRN: [id number] Date of Birth: 08/20/1919   Medicare Important Message Given:  Yes    Markale Birdsell 06/12/2017, 1:07 PM

## 2017-06-12 NOTE — Clinical Social Work Note (Signed)
Clinical Social Worker facilitated patient discharge including contacting patient family and facility to confirm patient discharge plans.  Clinical information faxed to facility and family agreeable with plan.  CSW arranged ambulance transport via [facility] to [facility] rehab.  RN to call report prior to discharge [phone]).  Clinical Social Worker will sign off for now as social work intervention is no longer needed. Please consult [place] again if new need arises.  Barbette Or, [place]

## 2017-06-12 NOTE — Progress Notes (Signed)
Right hip incision with staples dry and intact, dressing changed. A copy of discharge instructions was given to his wife. Report given to Candida Peeling RN of [facility].  Discharged pt to SNF via [facility].

## 2017-06-12 NOTE — Discharge Summary (Signed)
Physician Discharge Summary  Johnny Morrow NGE:[id number] DOB: Jan 01, 1920 DOA: 06/07/2017  PCP: Wenda Low, MD  Admit date: 06/07/2017 Discharge date: 06/12/2017  Time spent: 45 minutes  Recommendations for Outpatient Follow-up:  1. Recommend outpatient coordination with his nephrologist at [facility] and coordination regarding an appointment in 3 weeks post skilled nursing facility discharge as per skilled nursing physician--Nephrologist is Dr. Salvatore Decent 2. Will need basic metabolic panel and CBC in 3-4 days, check iron stores in 3 weeks-replace with IV iron if needed and consider Aranesp also 3. Recommend intermittent use of Lasix--would use 20-40 mg every other day depending on volume status--this was held on discharge as a mild bump in creatinine occurred during hospital stay his baseline is 4 and he was around 4.9 4. He is not an anticoagulation candidate given his high risk of bleeding in the past--he will need aspirin 81 mg twice daily for 4 weeks ending 07/05/2017 5. Please keep on a good bowel regimen with MiraLAX senna and if needed Dulcolax which has worked for him in the past  Discharge Diagnoses:  Principal Problem:   Closed intertrochanteric fracture of hip, right, initial encounter ([place]) Active Problems:   Essential hypertension, benign   History of DVT of lower extremity, left   Chronic kidney disease (CKD), stage IV (severe) (HCC)   Normocytic anemia   AF (paroxysmal atrial fibrillation) (HCC)   Acute on chronic diastolic CHF (congestive heart failure) ([place])   Discharge Condition: Improved  Diet recommendation: Heart healthy renal diet  Filed Weights   06/09/17 0430 06/10/17 0500 06/12/17 0500  Weight: 55.8 kg (123 lb 0.3 oz) 56.3 kg (124 lb 1.9 oz) 61.2 kg (135 lb)    History of present illness:  Specialists:      Dr. Harlow Ohms cardiology Dr. Salvatore Decent nephrology Dr. Marcene Corning [facility]   Brief Narrative:    83 year old male Known history of a flutter--some intolerance to beta blockade secondary to bradycardia Stage IV CKD secondary to chronic obstructive uropathy versus hypertensive nephrosclerosis--baseline creatinine 4.0 as per chart 05/14/2017 Prostate cancer status post XRT 2001 and prostatectomy 1993             Part showed a left ureteric obstruction--offered retrograde pyelogram which patient deferred at urologist office Nonocclusive thrombus in left popliteal vein Moderate aortic stenosis Heart failure Severe retroperitoneal bleed setting of anticoagulation requiring multiple blood transfusions  Most recent admission 8 6-8/05/2017 with decompensated CHF--baseline weight 115 pounds  Admitted on 06/07/2017 with loss of balance without presyncope, radiograph showed comminuted right hip fracture-Dr. Doran Durand saw the patient and surgery was performed on 06/07/2017  Hospital Course:  Hip Fracture: s/op surgery 10/21 - PT/OT recs SNF - [facility] bid AC - WBAT RLE - 2 week post-op Ortho visit will be arranged by orthopedics on discharge if not please consult contact their office for outpatient management and follow-up  Acute on chronic diastolic CHF: mild at time of admission.  Diuresed with IV lasix initally Holding all lasix today -Daily weights-weight stable and 155-156 pound range -Strict I/Os is 1 L this admission  ---Creatinine seems to have stabilized in the 4.8-5 range and I would stop all Lasix for now and use intermittent Lasix 20-40 mg every other day depending on volume status and work of breathing although he is clearly not in any respiratory distress now and does not need any further diuresis from my perspective  AoCKD IV-V: Cr 4.9. Baseline Cr 3.8. Followed by [facility].  Likely due to hypoperfusion  from surgery and anemia w/ increased lasix dose on day of admission.  - BMP shows Creat ~ 4.9, Baseline at [facility] earlier this year ~ 4 - Gentle IVF 75 cc/h--stop in 24 hr--agree  with cardiology keeping volumes even without Lasix or fluids on 10/25 -Please see above he is stabilized  Acute on chronic anemia: Hgb 8.6 after 2 units PRBC on 10/22. Hgb 9.0 on day of admission.  Hemoglobin now 7.1 - 1 more unit PRBC 10/24 with lasix in between--hemoglobin now 7.9 - Get iron stores and consider either IV iron or EPO this admission if worse  PAF: CHA2DS2-VASc = 5. Not an anticoagulation candidate due to bleeding issues in past. currently rate controlled. - tele showed no new findings  HTN: - continue norvasc, imdur, hydralazine  Chronic DVT: popliteal veing thrombosis. Not an anticoagulation candidate.   Constipation Stop colace and dulcola--Change to miralax scheduled and senna--- added Dulcolax prior to discharge in the seem to help as well   Consultants:  Dr. Doran Durand Orthoped[facility]  Procedures:   Hip repair 10/21  Antimicrobials:    none  Discharge Exam: Vitals:   06/11/17 2135 06/12/17 0510  BP: (!) 146/66 130/90  Pulse: 62 (!) 105  Resp: 16 16  Temp: 98.4 F (36.9 C) 98.2 F (36.8 C)  SpO2: 95% 97%   Patient seen at bedside hungry and waiting for breakfast no distress Had a good bowel movement yesterday Moving around fairly without much pain  General: EOMI NCAT Cardiovascular: S1-S2 irregularly irregular with 5/6 murmur left lower sternal edge, no bruit no JVD Respiratory: Chest clinically clear without rales rhonchi or crepitations Abdomen soft nontender Lower extremity wounds not examined today  Discharge Instructions   Discharge Instructions    Diet - low sodium heart healthy    Complete by:  As directed    Increase activity slowly    Complete by:  As directed    Weight bearing as tolerated    Complete by:  As directed      Current Discharge Medication List    START taking these medications   Details  aspirin EC 81 MG tablet Take 1 tablet (81 mg total) by mouth 2 (two) times daily. Qty: 84 tablet, Refills: 0     bisacodyl (DULCOLAX) 5 MG EC tablet Take 1 tablet (5 mg total) by mouth daily as needed for moderate constipation. Qty: 30 tablet, Refills: 0    polyethylene glycol (MIRALAX / GLYCOLAX) packet Take 17 g by mouth daily. Qty: 14 each, Refills: 0    traMADol (ULTRAM) 50 MG tablet Take 1 tablet (50 mg total) by mouth every 6 (six) hours as needed for moderate pain. Qty: 30 tablet, Refills: 0      CONTINUE these medications which have NOT CHANGED   Details  albuterol (PROVENTIL HFA;VENTOLIN HFA) 108 (90 Base) MCG/ACT inhaler Inhale 2 puffs into the lungs every 6 (six) hours as needed for wheezing or shortness of breath. Qty: 1 Inhaler, Refills: 0   Associated Diagnoses: Exertional dyspnea    amLODipine (NORVASC) 10 MG tablet Take 1 tablet (10 mg total) by mouth daily. Qty: 90 tablet, Refills: 1    B Complex Vitamins (VITAMIN-B COMPLEX PO) Take 1 tablet by mouth daily.     Bacillus Coagulans-Inulin (PROBIOTIC FORMULA) 1-250 BILLION-MG CAPS Take 250 mg by mouth daily.    Cholecalciferol (VITAMIN D3) 5000 units TABS Take 5,000 Units by mouth daily.     Coenzyme Q10 (CO Q 10) 100 MG CAPS Take 100 mg  by mouth daily.    doxazosin (CARDURA) 1 MG tablet Take 1 mg by mouth every evening.    Elastic Bandages & Supports (LUMBAR BACK BRACE/SUPPORT PAD) MISC Use as directed for lumbar scoliosis Qty: 1 each, Refills: 0    ferrous sulfate 325 (65 FE) MG tablet Take 1 tablet (325 mg total) by mouth daily with breakfast. Qty: 90 tablet, Refills: 2   Associated Diagnoses: Acute blood loss anemia    furosemide (LASIX) 40 MG tablet Take 40 mg by mouth daily.    hydrALAZINE (APRESOLINE) 50 MG tablet Take 1 tablet (50 mg total) by mouth 3 (three) times daily. Qty: 90 tablet, Refills: 1   Associated Diagnoses: Essential hypertension, benign    isosorbide mononitrate (IMDUR) 60 MG 24 hr tablet Take 60 mg by mouth daily.    Multiple Vitamin (MULTI VITAMIN PO) Take 1 tablet by mouth daily.     Omega  3 1000 MG CAPS Take 2,000 mg by mouth daily.       Allergies  Allergen Reactions  . Iodinated Diagnostic Agents Rash    Other Reaction: Allergy  . Penicillins Rash    Contact information for follow-up providers    [name], MD. Schedule an appointment as soon as possible for a visit in 2 week(s).   Specialty:  Orthopedic Surgery Contact information: [address] [place] [address] [phone]            Contact information for after-discharge care    Destination    [facility] [place] [facility] .   Specialty:  [place] information: [address] [place] [phone]                   The results of significant diagnostics from this hospitalization (including imaging, microbiology, ancillary and laboratory) are listed below for reference.    Significant Diagnostic Studies: Dg Chest 1 View  Result Date: 06/07/2017 CLINICAL DATA:  Acute right hip fracture. Pre-op respiratory exam. Prostate carcinoma. EXAM: CHEST 1 VIEW COMPARISON:  09/30/2016 FINDINGS: Heart size is stable.  Severe thoracolumbar dextroscoliosis. Diffuse nodular pulmonary interstitial prominence is new since previous study. This is nonspecific, and may be due to pulmonary edema, viral pneumonitis, or possibly metastatic disease. No evidence of pneumothorax or pleural effusion. IMPRESSION: New diffuse nodular pulmonary interstitial prominence, which is nonspecific. Differential considerations include pulmonary edema, viral pneumonitis, or possibly metastatic disease. Recommend clinical correlation, and consider continued chest radiographic followup versus chest CT. Electronically Signed   By: Earle Gell M.D.   On: 06/07/2017 11:49   Ct Head Wo Contrast  Result Date: 06/07/2017 CLINICAL DATA:  Recent fall, trauma, high risk for injury EXAM: CT HEAD WITHOUT CONTRAST CT CERVICAL SPINE WITHOUT CONTRAST  TECHNIQUE: Multidetector CT imaging of the head and cervical spine was performed following the standard protocol without intravenous contrast. Multiplanar CT image reconstructions of the cervical spine were also generated. COMPARISON:  None available FINDINGS: CT HEAD FINDINGS Brain: Brain atrophy evident with advanced chronic white matter microvascular ischemic changes throughout both cerebral hemispheres. No acute intracranial hemorrhage, mass lesion, definite new infarction, midline shift, herniation, hydrocephalus, or extra-axial fluid collection. No focal mass effect or edema. Cisterns are patent. Cerebellar atrophy as well. Vascular: Intracranial atherosclerosis noted.  No hyperdense vessel. Skull: No depressed skull fracture.  Left mastoid effusion noted. Sinuses/Orbits: Chronic scattered sinus mucosal thickening. Orbits are unremarkable and symmetric. Other: None. CT CERVICAL SPINE FINDINGS Alignment: Normal alignment. No subluxation or dislocation.  Facets are aligned. Multilevel facet arthropathy noted. Skull base and vertebrae: Bones are osteopenic. No acute osseous finding or fracture. Soft tissues and spinal canal: No prevertebral fluid or swelling. No visible canal hematoma. Carotid atherosclerosis evident. Disc levels: Multilevel degenerative spondylosis noted with disc space narrowing, sclerosis and endplate osteophytes spanning C2-C7 at all levels. Facet arthropathy most pronounced at C4-5 on the left. Upper chest: No acute finding.  Apical scarring. Other: None. IMPRESSION: Brain atrophy and chronic white matter microvascular changes. No acute intracranial abnormality by noncontrast CT Nonspecific left mastoid effusion Minor chronic sinus disease Cervical degenerative spondylosis and osteopenia without acute osseous finding, fracture or malalignment by CT. Electronically Signed   By: Jerilynn Mages.  Shick M.D.   On: 06/07/2017 11:35   Ct Cervical Spine Wo Contrast  Result Date: 06/07/2017 CLINICAL DATA:   Recent fall, trauma, high risk for injury EXAM: CT HEAD WITHOUT CONTRAST CT CERVICAL SPINE WITHOUT CONTRAST TECHNIQUE: Multidetector CT imaging of the head and cervical spine was performed following the standard protocol without intravenous contrast. Multiplanar CT image reconstructions of the cervical spine were also generated. COMPARISON:  None available FINDINGS: CT HEAD FINDINGS Brain: Brain atrophy evident with advanced chronic white matter microvascular ischemic changes throughout both cerebral hemispheres. No acute intracranial hemorrhage, mass lesion, definite new infarction, midline shift, herniation, hydrocephalus, or extra-axial fluid collection. No focal mass effect or edema. Cisterns are patent. Cerebellar atrophy as well. Vascular: Intracranial atherosclerosis noted.  No hyperdense vessel. Skull: No depressed skull fracture.  Left mastoid effusion noted. Sinuses/Orbits: Chronic scattered sinus mucosal thickening. Orbits are unremarkable and symmetric. Other: None. CT CERVICAL SPINE FINDINGS Alignment: Normal alignment. No subluxation or dislocation. Facets are aligned. Multilevel facet arthropathy noted. Skull base and vertebrae: Bones are osteopenic. No acute osseous finding or fracture. Soft tissues and spinal canal: No prevertebral fluid or swelling. No visible canal hematoma. Carotid atherosclerosis evident. Disc levels: Multilevel degenerative spondylosis noted with disc space narrowing, sclerosis and endplate osteophytes spanning C2-C7 at all levels. Facet arthropathy most pronounced at C4-5 on the left. Upper chest: No acute finding.  Apical scarring. Other: None. IMPRESSION: Brain atrophy and chronic white matter microvascular changes. No acute intracranial abnormality by noncontrast CT Nonspecific left mastoid effusion Minor chronic sinus disease Cervical degenerative spondylosis and osteopenia without acute osseous finding, fracture or malalignment by CT. Electronically Signed   By: Jerilynn Mages.  Shick  M.D.   On: 06/07/2017 11:35   Dg C-arm 1-60 Min  Result Date: 06/07/2017 CLINICAL DATA:  Right hip fracture fixation EXAM: DG C-ARM 61-120 MIN; OPERATIVE RIGHT HIP WITH PELVIS COMPARISON:  None. FINDINGS: There are 4 C-arm fluoroscopic views provided from a right femoral nail fixation of a comminuted intertrochanteric fracture of the proximal femur with avulsed lesser trochanter. Less varus angulation is currently identified on these images. No evidence of hardware failure or immediate postoperative fracture. Improved alignment is demonstrated though there is still some cephalad displacement of the femoral shaft relative to the femoral neck. IMPRESSION: Status post nail fixation of intertrochanteric comminuted fracture the right femur. Electronically Signed   By: Ashley Royalty M.D.   On: 06/07/2017 23:13   Dg Hip Operative Unilat W Or W/o Pelvis Right  Result Date: 06/07/2017 CLINICAL DATA:  Right hip fracture fixation EXAM: DG C-ARM 61-120 MIN; OPERATIVE RIGHT HIP WITH PELVIS COMPARISON:  None. FINDINGS: There are 4 C-arm fluoroscopic views provided from a right femoral nail fixation of a comminuted intertrochanteric fracture of the proximal femur with avulsed lesser trochanter. Less varus angulation is  currently identified on these images. No evidence of hardware failure or immediate postoperative fracture. Improved alignment is demonstrated though there is still some cephalad displacement of the femoral shaft relative to the femoral neck. IMPRESSION: Status post nail fixation of intertrochanteric comminuted fracture the right femur. Electronically Signed   By: Ashley Royalty M.D.   On: 06/07/2017 23:13   Dg Hip Unilat  With Pelvis 2-3 Views Right  Result Date: 06/07/2017 CLINICAL DATA:  Followup getting out of bed this morning. Right hip pain. Initial encounter. EXAM: DG HIP (WITH OR WITHOUT PELVIS) 2-3V RIGHT COMPARISON:  None. FINDINGS: Comminuted intertrochanteric fracture of the right hip is seen.  No evidence of dislocation. No pelvic fracture identified. Generalized osteopenia noted. Bilateral pelvic surgical clips are seen from previous lymphadenectomy. Peripheral vascular calcification noted in right thigh. IMPRESSION: Comminuted intertrochanteric right hip fracture. Electronically Signed   By: Earle Gell M.D.   On: 06/07/2017 11:43    Microbiology: No results found for this or any previous visit (from the past 240 hour(s)).   Labs: Basic Metabolic Panel:  Recent Labs Lab 06/09/17 0737 06/09/17 1512 06/10/17 0247 06/11/17 0434 06/12/17 0308  NA 136 135 135 137 136  K 4.8 4.6 4.1 3.6 3.6  CL 108 110 107 108 108  CO2 19* 16* 17* 17* 18*  GLUCOSE 101* 141* 115* 104* 124*  BUN 85* 85* 84* 88* 91*  CREATININE 4.93* 5.03* 4.95* 5.15* 4.86*  CALCIUM 8.5* 8.1* 8.0* 8.2* 8.4*  PHOS  --   --   --   --  3.6   Liver Function Tests:  Recent Labs Lab 06/07/17 1022 06/12/17 0308  AST 30  --   ALT 18  --   ALKPHOS 62  --   BILITOT 0.9  --   PROT 6.6  --   ALBUMIN 3.3* 2.4*   No results for input(s): LIPASE, AMYLASE in the last 168 hours. No results for input(s): AMMONIA in the last 168 hours. CBC:  Recent Labs Lab 06/07/17 1022  06/09/17 0737 06/09/17 1512 06/10/17 0247 06/11/17 0434 06/12/17 0308  WBC 7.2  < > 7.7 6.5 5.8 4.9 4.9  NEUTROABS 5.4  --   --   --   --  3.0  --   HGB 9.0*  < > 8.6* 7.9* 7.1* 7.9* 7.9*  HCT 25.6*  < > 24.6* 23.1* 20.3* 22.3* 22.3*  MCV 80.3  < > 81.2 81.6 81.9 80.8 81.4  PLT 148*  < > 95* 95* 91* 89* 100*  < > = values in this interval not displayed. Cardiac Enzymes: No results for input(s): CKTOTAL, CKMB, CKMBINDEX, TROPONINI in the last 168 hours. BNP: BNP (last 3 results)  Recent Labs  06/07/17 1022  BNP 409.3*    ProBNP (last 3 results)  Recent Labs  08/20/16 1221 12/22/16 1453  PROBNP 373.0* 999.0*    CBG: No results for input(s): GLUCAP in the last 168 hours.     SignedNita Sells MD    [facility] 06/12/2017, 8:30 AM

## 2017-06-12 NOTE — Progress Notes (Addendum)
Progress Note  Patient Name: Nylan Nevel Date of Encounter: 06/12/2017  Primary Cardiologist:  Harrington Challenger (seen over 1 1/2 year ago)  Subjective   Breathing is fair  NO CP    Inpatient Medications    Scheduled Meds: . amLODipine  10 mg Oral Daily  . doxazosin  1 mg Oral QPM  . enoxaparin (LOVENOX) injection  30 mg Subcutaneous Q24H  . ferrous sulfate  325 mg Oral Q breakfast  . hydrALAZINE  50 mg Oral TID  . isosorbide mononitrate  60 mg Oral Daily  . polyethylene glycol  17 g Oral Daily  . senna  1 tablet Oral BID   Continuous Infusions: . methocarbamol (ROBAXIN)  IV Stopped (06/10/17 0743)   PRN Meds: acetaminophen **OR** acetaminophen, bisacodyl, HYDROcodone-acetaminophen, menthol-cetylpyridinium **OR** phenol, methocarbamol (ROBAXIN)  IV, morphine injection, ondansetron **OR** ondansetron (ZOFRAN) IV, oxyCODONE   Vital Signs    Vitals:   06/11/17 1445 06/11/17 2135 06/12/17 0500 06/12/17 0510  BP: (!) 124/54 (!) 146/66  130/90  Pulse: 67 62  (!) 105  Resp: 16 16  16   Temp: 98.3 F (36.8 C) 98.4 F (36.9 C)  98.2 F (36.8 C)  TempSrc: Oral Oral  Oral  SpO2: 96% 95%  97%  Weight:   135 lb (61.2 kg)   Height:        Intake/Output Summary (Last 24 hours) at 06/12/17 1031 Last data filed at 06/12/17 0816  Gross per 24 hour  Intake              350 ml  Output              400 ml  Net              -50 ml   Filed Weights   06/09/17 0430 06/10/17 0500 06/12/17 0500  Weight: 123 lb 0.3 oz (55.8 kg) 124 lb 1.9 oz (56.3 kg) 135 lb (61.2 kg)    Telemetry    SR   - Personally Reviewed  ECG      Physical Exam   GEN: No acute distress.  Pt sleepy Cardiac: RRR, Gr III/VI systolic murmur base   No rubs, or gallops.  Respiratory: Rales at bases GI: Soft, nontender, non-distended  MS: No edema; Neuro:  Nonfocal    Labs    Chemistry Recent Labs Lab 06/07/17 1022  06/10/17 0247 06/11/17 0434 06/12/17 0308  NA 134*  < > 135 137 136  K 4.5  < > 4.1 3.6  3.6  CL 107  < > 107 108 108  CO2 17*  < > 17* 17* 18*  GLUCOSE 133*  < > 115* 104* 124*  BUN 76*  < > 84* 88* 91*  CREATININE 3.73*  < > 4.95* 5.15* 4.86*  CALCIUM 8.8*  < > 8.0* 8.2* 8.4*  PROT 6.6  --   --   --   --   ALBUMIN 3.3*  --   --   --  2.4*  AST 30  --   --   --   --   ALT 18  --   --   --   --   ALKPHOS 62  --   --   --   --   BILITOT 0.9  --   --   --   --   GFRNONAA 13*  < > 9* 9* 9*  GFRAA 15*  < > 10* 10* 11*  ANIONGAP 10  < > 11 12 10   < > =  values in this interval not displayed.   Hematology  Recent Labs Lab 06/10/17 0247 06/11/17 0434 06/11/17 1044 06/12/17 0308  WBC 5.8 4.9  --  4.9  RBC 2.48* 2.76* 2.81* 2.74*  HGB 7.1* 7.9*  --  7.9*  HCT 20.3* 22.3*  --  22.3*  MCV 81.9 80.8  --  81.4  MCH 28.6 28.6  --  28.8  MCHC 35.0 35.4  --  35.4  RDW 14.1 14.5  --  14.1  PLT 91* 89*  --  100*    Cardiac EnzymesNo results for input(s): TROPONINI in the last 168 hours. No results for input(s): TROPIPOC in the last 168 hours.   BNP  Recent Labs Lab 06/07/17 1022  BNP 409.3*     DDimer No results for input(s): DDIMER in the last 168 hours.   Radiology    No results found.  Cardiac Studies     Patient Profile       Assessment & Plan    1  Hx of atrial flutter  Pt is off of tele  HR was a little high earlier  REgular   Pt not a candidate for anticoagulation    2  Mod AS  Follow clinically      3  Hx chronic diastolic CHF VOlume is up some but renal function limits use diuretics  WIll need to be followed   Watch Na   4  HTN  BP is OK   5  Renal  BUN/Ctr  Cr sl improved    6  Anemia  Fp;;pw as outpt  Multifactoral  Will check on PPI   For questions or updates, please contact [facility] Please consult [email or web address] for contact info under Cardiology/STEMI.      Signed, Dorris Carnes, MD  06/12/2017, 10:31 AM

## 2017-06-13 DIAGNOSIS — Z79899 Other long term (current) drug therapy: Secondary | ICD-10-CM | POA: Diagnosis not present

## 2017-06-13 DIAGNOSIS — D649 Anemia, unspecified: Secondary | ICD-10-CM | POA: Diagnosis not present

## 2017-06-15 ENCOUNTER — Non-Acute Institutional Stay (SKILLED_NURSING_FACILITY): Payer: Medicare Other | Admitting: Adult Health

## 2017-06-15 ENCOUNTER — Observation Stay (HOSPITAL_COMMUNITY): Payer: Medicare Other

## 2017-06-15 ENCOUNTER — Emergency Department (HOSPITAL_COMMUNITY): Payer: Medicare Other

## 2017-06-15 ENCOUNTER — Encounter: Payer: Self-pay | Admitting: Adult Health

## 2017-06-15 ENCOUNTER — Inpatient Hospital Stay (HOSPITAL_COMMUNITY)
Admission: EM | Admit: 2017-06-15 | Discharge: 2017-06-22 | DRG: 309 | Disposition: A | Discharge status: Skilled Nursing Facility | Payer: Medicare Other | Attending: Internal Medicine | Admitting: Internal Medicine

## 2017-06-15 DIAGNOSIS — N184 Chronic kidney disease, stage 4 (severe): Secondary | ICD-10-CM | POA: Diagnosis present

## 2017-06-15 DIAGNOSIS — Z91041 Radiographic dye allergy status: Secondary | ICD-10-CM

## 2017-06-15 DIAGNOSIS — R748 Abnormal levels of other serum enzymes: Secondary | ICD-10-CM | POA: Diagnosis not present

## 2017-06-15 DIAGNOSIS — R278 Other lack of coordination: Secondary | ICD-10-CM | POA: Diagnosis not present

## 2017-06-15 DIAGNOSIS — D638 Anemia in other chronic diseases classified elsewhere: Secondary | ICD-10-CM

## 2017-06-15 DIAGNOSIS — Z8546 Personal history of malignant neoplasm of prostate: Secondary | ICD-10-CM

## 2017-06-15 DIAGNOSIS — D631 Anemia in chronic kidney disease: Secondary | ICD-10-CM | POA: Diagnosis not present

## 2017-06-15 DIAGNOSIS — Z9181 History of falling: Secondary | ICD-10-CM | POA: Diagnosis not present

## 2017-06-15 DIAGNOSIS — S72141A Displaced intertrochanteric fracture of right femur, initial encounter for closed fracture: Secondary | ICD-10-CM | POA: Diagnosis not present

## 2017-06-15 DIAGNOSIS — M62551 Muscle wasting and atrophy, not elsewhere classified, right thigh: Secondary | ICD-10-CM | POA: Diagnosis not present

## 2017-06-15 DIAGNOSIS — Z86718 Personal history of other venous thrombosis and embolism: Secondary | ICD-10-CM

## 2017-06-15 DIAGNOSIS — I10 Essential (primary) hypertension: Secondary | ICD-10-CM | POA: Diagnosis present

## 2017-06-15 DIAGNOSIS — D649 Anemia, unspecified: Secondary | ICD-10-CM

## 2017-06-15 DIAGNOSIS — R2689 Other abnormalities of gait and mobility: Secondary | ICD-10-CM | POA: Diagnosis not present

## 2017-06-15 DIAGNOSIS — Z79899 Other long term (current) drug therapy: Secondary | ICD-10-CM

## 2017-06-15 DIAGNOSIS — I071 Rheumatic tricuspid insufficiency: Secondary | ICD-10-CM | POA: Diagnosis present

## 2017-06-15 DIAGNOSIS — Z923 Personal history of irradiation: Secondary | ICD-10-CM

## 2017-06-15 DIAGNOSIS — I5032 Chronic diastolic (congestive) heart failure: Secondary | ICD-10-CM | POA: Diagnosis present

## 2017-06-15 DIAGNOSIS — Z87891 Personal history of nicotine dependence: Secondary | ICD-10-CM

## 2017-06-15 DIAGNOSIS — Z7982 Long term (current) use of aspirin: Secondary | ICD-10-CM

## 2017-06-15 DIAGNOSIS — I130 Hypertensive heart and chronic kidney disease with heart failure and stage 1 through stage 4 chronic kidney disease, or unspecified chronic kidney disease: Secondary | ICD-10-CM | POA: Diagnosis present

## 2017-06-15 DIAGNOSIS — Z9079 Acquired absence of other genital organ(s): Secondary | ICD-10-CM

## 2017-06-15 DIAGNOSIS — Z880 Allergy status to penicillin: Secondary | ICD-10-CM

## 2017-06-15 DIAGNOSIS — R58 Hemorrhage, not elsewhere classified: Secondary | ICD-10-CM | POA: Diagnosis not present

## 2017-06-15 DIAGNOSIS — I480 Paroxysmal atrial fibrillation: Secondary | ICD-10-CM

## 2017-06-15 DIAGNOSIS — I248 Other forms of acute ischemic heart disease: Secondary | ICD-10-CM | POA: Diagnosis present

## 2017-06-15 DIAGNOSIS — R778 Other specified abnormalities of plasma proteins: Secondary | ICD-10-CM

## 2017-06-15 DIAGNOSIS — R000 Tachycardia, unspecified: Secondary | ICD-10-CM | POA: Diagnosis present

## 2017-06-15 DIAGNOSIS — Z809 Family history of malignant neoplasm, unspecified: Secondary | ICD-10-CM

## 2017-06-15 DIAGNOSIS — R918 Other nonspecific abnormal finding of lung field: Secondary | ICD-10-CM | POA: Diagnosis not present

## 2017-06-15 DIAGNOSIS — R531 Weakness: Secondary | ICD-10-CM | POA: Diagnosis not present

## 2017-06-15 DIAGNOSIS — I4892 Unspecified atrial flutter: Secondary | ICD-10-CM | POA: Diagnosis not present

## 2017-06-15 DIAGNOSIS — I082 Rheumatic disorders of both aortic and tricuspid valves: Secondary | ICD-10-CM | POA: Diagnosis present

## 2017-06-15 DIAGNOSIS — Z8679 Personal history of other diseases of the circulatory system: Secondary | ICD-10-CM

## 2017-06-15 DIAGNOSIS — R7989 Other specified abnormal findings of blood chemistry: Secondary | ICD-10-CM | POA: Diagnosis not present

## 2017-06-15 DIAGNOSIS — N189 Chronic kidney disease, unspecified: Secondary | ICD-10-CM

## 2017-06-15 DIAGNOSIS — Z8674 Personal history of sudden cardiac arrest: Secondary | ICD-10-CM

## 2017-06-15 DIAGNOSIS — K5900 Constipation, unspecified: Secondary | ICD-10-CM | POA: Diagnosis not present

## 2017-06-15 DIAGNOSIS — I495 Sick sinus syndrome: Secondary | ICD-10-CM | POA: Diagnosis present

## 2017-06-15 DIAGNOSIS — Z8249 Family history of ischemic heart disease and other diseases of the circulatory system: Secondary | ICD-10-CM

## 2017-06-15 DIAGNOSIS — E860 Dehydration: Secondary | ICD-10-CM | POA: Diagnosis present

## 2017-06-15 DIAGNOSIS — I350 Nonrheumatic aortic (valve) stenosis: Secondary | ICD-10-CM

## 2017-06-15 DIAGNOSIS — Z4789 Encounter for other orthopedic aftercare: Secondary | ICD-10-CM | POA: Diagnosis not present

## 2017-06-15 DIAGNOSIS — M84451S Pathological fracture, right femur, sequela: Secondary | ICD-10-CM | POA: Diagnosis not present

## 2017-06-15 HISTORY — DX: Chronic kidney disease, stage 3 (moderate): N18.3

## 2017-06-15 HISTORY — DX: Hemorrhage, not elsewhere classified: R58

## 2017-06-15 HISTORY — DX: Chronic kidney disease, stage 3 unspecified: N18.30

## 2017-06-15 LAB — CBC AND DIFFERENTIAL
HEMATOCRIT: 24 — AB (ref 41–53)
Hemoglobin: 7.8 — AB (ref 13.5–17.5)
PLATELETS: 141 — AB (ref 150–399)
WBC: 8.7

## 2017-06-15 LAB — CBC WITH DIFFERENTIAL/PLATELET
BASOS ABS: 0 10*3/uL (ref 0.0–0.1)
BASOS PCT: 0 %
Eosinophils Absolute: 0.3 10*3/uL (ref 0.0–0.7)
Eosinophils Relative: 4 %
HEMATOCRIT: 20.4 % — AB (ref 39.0–52.0)
HEMOGLOBIN: 7 g/dL — AB (ref 13.0–17.0)
Lymphocytes Relative: 17 %
Lymphs Abs: 1.4 10*3/uL (ref 0.7–4.0)
MCH: 28.2 pg (ref 26.0–34.0)
MCHC: 34.3 g/dL (ref 30.0–36.0)
MCV: 82.3 fL (ref 78.0–100.0)
Monocytes Absolute: 0.5 10*3/uL (ref 0.1–1.0)
Monocytes Relative: 6 %
NEUTROS ABS: 5.9 10*3/uL (ref 1.7–7.7)
NEUTROS PCT: 73 %
Platelets: 181 10*3/uL (ref 150–400)
RBC: 2.48 MIL/uL — AB (ref 4.22–5.81)
RDW: 14.3 % (ref 11.5–15.5)
WBC: 8.1 10*3/uL (ref 4.0–10.5)

## 2017-06-15 LAB — BASIC METABOLIC PANEL
ANION GAP: 12 (ref 5–15)
BUN: 114 — AB (ref 4–21)
BUN: 123 mg/dL — AB (ref 6–20)
CO2: 17 mmol/L — ABNORMAL LOW (ref 22–32)
CREATININE: 4.56 mg/dL — AB (ref 0.61–1.24)
Calcium: 8.6 mg/dL — ABNORMAL LOW (ref 8.9–10.3)
Chloride: 105 mmol/L (ref 101–111)
Creatinine: 4.6 — AB (ref 0.6–1.3)
GFR, EST AFRICAN AMERICAN: 12 mL/min — AB (ref >60)
GFR, EST NON AFRICAN AMERICAN: 10 mL/min — AB (ref >60)
GLUCOSE: 106
Glucose, Bld: 118 mg/dL — ABNORMAL HIGH (ref 65–99)
POTASSIUM: 4.2 (ref 3.4–5.3)
Potassium: 3.9 mmol/L (ref 3.5–5.1)
SODIUM: 139 (ref 137–147)
Sodium: 134 mmol/L — ABNORMAL LOW (ref 135–145)

## 2017-06-15 LAB — TYPE AND SCREEN
ABO/RH(D): AB POS
ANTIBODY SCREEN: NEGATIVE

## 2017-06-15 LAB — POC OCCULT BLOOD, ED: Fecal Occult Bld: NEGATIVE

## 2017-06-15 LAB — TROPONIN I: TROPONIN I: 0.06 ng/mL — AB (ref <0.03)

## 2017-06-15 MED ORDER — TECHNETIUM TO 99M ALBUMIN AGGREGATED
4.4000 | Freq: Once | INTRAVENOUS | Status: AC | PRN
  Administered 2017-06-15: 4.4 via INTRAVENOUS

## 2017-06-15 MED ORDER — TECHNETIUM TC 99M DIETHYLENETRIAME-PENTAACETIC ACID
31.6000 | Freq: Once | INTRAVENOUS | Status: AC | PRN
  Administered 2017-06-15: 31.6 via RESPIRATORY_TRACT

## 2017-06-15 MED ORDER — SODIUM CHLORIDE 0.9 % IV BOLUS (SEPSIS)
250.0000 mL | Freq: Once | INTRAVENOUS | Status: AC
  Administered 2017-06-15: 250 mL via INTRAVENOUS

## 2017-06-15 NOTE — ED Notes (Signed)
[facility] called and they are ready for pt. Will be sending for him. Pt stable and watching TV. Denies pain or any other concerns. Will continue to monitor.

## 2017-06-15 NOTE — H&P (Signed)
History and Physical    [name] KYH:[id number] DOB: 1920-01-28 DOA: 06/15/2017  PCP: Wenda Low, MD  Patient coming from:   rehab  Chief Complaint:   Increased heart rate  HPI: Johnny Morrow is a 83 y.o. male with medical history significant of recent hip repair last week, afib, DVT, multiple blood transfusions, retroperitoneal bleed (wife thinks in may 2018) which is reason why he is not anticoagulated, ckd comes in for tachycardia all weekend.  He has not been running fevers.  He was just d/c 3 days ago.  Has been doing well with rehab.  No weakness.  No n/v/d.  No sob or chest pain.  C/o hip pain which is mild.  There was no doctor to see him at rehab this weekend and wife upset that his heart rate has been high all weekend without being evaluated so she insisted he come to ED today.  She does not know how high.  Pt referred for admission to r/o PE.  His rate was high around 140s now in the 90s with only a small ivf bolus.  Pt has no complaints.  They are really unsure when he had this bleed.  Review of Systems: As per HPI otherwise 10 point review of systems negative.   Past Medical History:  Diagnosis Date  . Anemia   . Arthritis   . Atrial fibrillation ([place])   . Cataract   . CHF (congestive heart failure) ([place])   . CKD (chronic kidney disease)    Follows with nephrologists.  . DVT (deep venous thrombosis) ([facility])    LLE  . Heart murmur   . History of blood transfusion ~ 01/2017; 05/2017   "blood loss; anemia"  . Hypertension   . Pneumonia 12/2016   "a touch"  . Prostate cancer [facility])     follows with hematologists q 6 months. S/p radical prostatectomy and proton therapy in 2001.  . Radiation cystitis   . Retinal hemorrhage of both eyes    follows with ophthalmologists.    Past Surgical History:  Procedure Laterality Date  . INGUINAL HERNIA REPAIR    . INTERTROCHANTERIC HIP FRACTURE SURGERY Right 06/07/2017  . INTRAMEDULLARY (IM) NAIL INTERTROCHANTERIC Right 06/07/2017     Procedure: INTRAMEDULLARY (IM) NAIL INTERTROCHANTRIC;  Surgeon: [name], MD;  Location: [place];  Service: Orthopedics;  Laterality: Right;  . PROSTATECTOMY    . TONSILLECTOMY       reports that he quit smoking about 71 years ago. His smoking use included Cigarettes. He has never used smokeless tobacco. He reports that he drinks about 0.6 oz of alcohol per week . He reports that he does not use drugs.  Allergies  Allergen Reactions  . Iodinated Diagnostic Agents Rash    Other Reaction: Allergy  . Penicillins Rash    Family History  Problem Relation Age of Onset  . COPD Brother   . Heart attack Brother   . Cancer Father   . Heart attack Father   . Diabetes Mother     Prior to Admission medications   Medication Sig Start Date End Date Taking? Authorizing Provider  albuterol (PROVENTIL HFA;VENTOLIN HFA) 108 (90 Base) MCG/ACT inhaler Inhale 2 puffs into the lungs every 6 (six) hours as needed for wheezing or shortness of breath. 08/20/16   [place], Betty G, MD  amLODipine (NORVASC) 10 MG tablet Take 1 tablet (10 mg total) by mouth daily. 03/09/17   [place], Betty G, MD  aspirin EC 81 MG tablet Take 1 tablet (  81 mg total) by mouth 2 (two) times daily. 06/08/17   Corky Sing, PA-C  B Complex Vitamins (VITAMIN-B COMPLEX PO) Take 1 tablet by mouth daily.     [provider]  Bacillus Coagulans-Inulin (PROBIOTIC FORMULA) 1-250 BILLION-MG CAPS Take 250 mg by mouth daily.    [provider]  bisacodyl (DULCOLAX) 5 MG EC tablet Take 1 tablet (5 mg total) by mouth daily as needed for moderate constipation. 06/12/17   Nita Sells, MD  Cholecalciferol (VITAMIN D3) 5000 units TABS Take 5,000 Units by mouth daily.     [provider]  Coenzyme Q10 (CO Q 10) 100 MG CAPS Take 100 mg by mouth daily.    [provider]  doxazosin (CARDURA) 1 MG tablet Take 1 mg by mouth every evening.    [provider]  Elastic Bandages & Supports (LUMBAR  BACK BRACE/SUPPORT PAD) MISC Use as directed for lumbar scoliosis 09/25/15   Binnie Rail, MD  ferrous sulfate 325 (65 FE) MG tablet Take 1 tablet (325 mg total) by mouth daily with breakfast. 12/22/16   [place], Betty G, MD  furosemide (LASIX) 40 MG tablet Take 40 mg by mouth daily.    [provider]  hydrALAZINE (APRESOLINE) 50 MG tablet Take 1 tablet (50 mg total) by mouth 3 (three) times daily. 12/22/16   [place], Betty G, MD  isosorbide mononitrate (IMDUR) 60 MG 24 hr tablet Take 60 mg by mouth daily.    [provider]  Multiple Vitamin (MULTI VITAMIN PO) Take 1 tablet by mouth daily.     [provider]  Omega 3 1000 MG CAPS Take 2,000 mg by mouth daily.    [provider]  polyethylene glycol (MIRALAX / GLYCOLAX) packet Take 17 g by mouth daily. 06/12/17   Nita Sells, MD  traMADol (ULTRAM) 50 MG tablet Take 1 tablet (50 mg total) by mouth every 6 (six) hours as needed for moderate pain. 06/09/17   Corky Sing, PA-C    Physical Exam: Vitals:   06/15/17 1845 06/15/17 1900 06/15/17 1915 06/15/17 1930  BP: 106/60 126/61 105/66 110/79  Pulse: 62 (!) 47 62 81  Resp: 18 14 16  (!) 28  Temp:      TempSrc:      SpO2: 98% 98% (!) 77% 97%  Weight:      Height:        Constitutional: NAD, calm, comfortable Vitals:   06/15/17 1845 06/15/17 1900 06/15/17 1915 06/15/17 1930  BP: 106/60 126/61 105/66 110/79  Pulse: 62 (!) 47 62 81  Resp: 18 14 16  (!) 28  Temp:      TempSrc:      SpO2: 98% 98% (!) 77% 97%  Weight:      Height:       Eyes: PERRL, lids and conjunctivae normal ENMT: Mucous membranes are moist. Posterior pharynx clear of any exudate or lesions.Normal dentition.  Neck: normal, supple, no masses, no thyromegaly Respiratory: clear to auscultation bilaterally, no wheezing, no crackles. Normal respiratory effort. No accessory muscle use.  Cardiovascular: Regular rate and rhythm, no murmurs / rubs / gallops. No extremity edema. 2+  pedal pulses. No carotid bruits.  Abdomen: no tenderness, no masses palpated. No hepatosplenomegaly. Bowel sounds positive.  Musculoskeletal: no clubbing / cyanosis. No joint deformity upper and lower extremities. Good ROM, no contractures. Normal muscle tone.  Skin: no rashes, lesions, ulcers. No induration Neurologic: CN 2-12 grossly intact. Sensation intact, DTR normal. Strength 5/5 in all  4.  Psychiatric: Normal judgment and insight. Alert and oriented x 3. Normal mood.    Labs on Admission: I have personally reviewed following labs and imaging studies  CBC:  Recent Labs Lab 06/09/17 1512 06/10/17 0247 06/11/17 0434 06/12/17 0308 06/15/17 06/15/17 1730  WBC 6.5 5.8 4.9 4.9 8.7 8.1  NEUTROABS  --   --  3.0  --   --  5.9  HGB 7.9* 7.1* 7.9* 7.9* 7.8* 7.0*  HCT 23.1* 20.3* 22.3* 22.3* 24* 20.4*  MCV 81.6 81.9 80.8 81.4  --  82.3  PLT 95* 91* 89* 100* 141* 329   Basic Metabolic Panel:  Recent Labs Lab 06/09/17 1512 06/10/17 0247 06/11/17 0434 06/12/17 0308 06/15/17 06/15/17 1730  NA 135 135 137 136 139 134*  K 4.6 4.1 3.6 3.6 4.2 3.9  CL 110 107 108 108  --  105  CO2 16* 17* 17* 18*  --  17*  GLUCOSE 141* 115* 104* 124*  --  118*  BUN 85* 84* 88* 91* 114* 123*  CREATININE 5.03* 4.95* 5.15* 4.86* 4.6* 4.56*  CALCIUM 8.1* 8.0* 8.2* 8.4*  --  8.6*  PHOS  --   --   --  3.6  --   --    GFR: Estimated Creatinine Clearance: 7.9 mL/min (A) (by C-G formula based on SCr of 4.56 mg/dL (H)). Liver Function Tests:  Recent Labs Lab 06/12/17 0308  ALBUMIN 2.4*   Cardiac Enzymes:  Recent Labs Lab 06/15/17 1730  TROPONINI 0.06*   BNP (last 3 results)  Recent Labs  08/20/16 1221 12/22/16 1453  PROBNP 373.0* 999.0*   Radiological Exams on Admission: Dg Chest Portable 1 View  Result Date: 06/15/2017 CLINICAL DATA:  Tachycardia EXAM: PORTABLE CHEST 1 VIEW COMPARISON:  06/07/2017 FINDINGS: Prior interstitial prominence has improved/resolved, suggesting resolution of  interstitial edema. Mild patchy bilateral lower lobe opacities, likely atelectasis. No definite pleural effusions. No pneumothorax. Cardiomegaly. Midthoracic dextroscoliosis. IMPRESSION: Prior interstitial edema has resolved. Mild patchy bilateral lower lobe opacities, likely atelectasis. Electronically Signed   By: Julian Hy M.D.   On: 06/15/2017 18:06    EKG: Independently reviewed. Sinus tach, rbbb old c/w old Old chart reviewed Case discussed with edp   Assessment/Plan 83 yo male with recent right hip pair comes in with tachycardia and mild elevation of his troponin  Principal Problem:   Tachycardia- currently nsr.  Has h/o afib/aflutter.  Monitor on tele monitor overnight.  Change albuterol to xoponex.  On no rate controlling meds.  If persistently elevated again consider adding bblocker or dilt. No fever.  High risk for PE, obtain stat vq scan.    Active Problems:   Essential hypertension, benign- cont norvasc   History of DVT of lower extremity, left- noted, only on aspirin bid   Moderate tricuspid regurgitation- noted, ck cardiac echo in am   Mild aortic stenosis- noted   Chronic kidney disease (CKD), stage IV (severe) ([facility])- stable at baseline   AF (paroxysmal atrial fibrillation) ([place])- noted, nsr at this moment   Closed intertrochanteric fracture of hip, right, initial encounter [facility] 10 /18- noted   Retroperitoneal bleed 5/18- they think this occurred in may.  Obtain old records.  Hold off on anticoagulants unless vq positive or trop significantly elevates   Chronic diastolic CHF (congestive heart failure) ([place])- stable at this time   Positive troponin - mildly elevated.  Serial and trend.  If markedly increases consider heparin drip but will hold off at this time due to what  sounds like recent retroperotineal bleed.  R/o PE as above.  Cardiac echo in am.   Chronic anemia - hgb above 7 barely.  Heme neg.  No transfusion at this time unless drops further.   DVT  prophylaxis:  scds Code Status:  full Family Communication:  wife Disposition Plan:   Per day team Consults called:  none Admission status:  observation   Lukisha Procida A MD [facility]  If 7PM-7AM, please contact night-coverage [email or web address] Password [email or web address]  06/15/2017, 8:23 PM

## 2017-06-15 NOTE — ED Notes (Signed)
ED Provider at bedside. 

## 2017-06-15 NOTE — ED Notes (Signed)
Patient denies pain and is resting comfortably.  

## 2017-06-15 NOTE — ED Notes (Signed)
Pt to VQ scan.

## 2017-06-15 NOTE — Progress Notes (Signed)
Location:  Occupational psychologist of Service:  SNF (31) Provider:   Cindi Carbon, [place] [phone]   Wenda Low, MD  Patient Care Team: Wenda Low, MD as PCP - General (Internal Medicine)  Extended Emergency Contact Information Primary Emergency Contact: Marylin Crosby of [place] Phone: [phone] Relation: Spouse Secondary Emergency Contact: Leanord Hawking [place] of [place] Mobile Phone: [phone] Relation: Daughter  Code Status:  DNR Goals of care: Advanced Directive information Advanced Directives 06/07/2017  Does Patient Have a Medical Advance Directive? Yes  Type of Advance Directive [place]  Does patient want to make changes to medical advance directive? No - Patient declined  Copy of [place] in Chart? No - copy requested     Chief Complaint  Patient presents with  . Acute Visit    tachycardia/sob    HPI:  Pt is a 83 y.o. male seen today for tachycardia with a HR of 122 as well as mild sob. He has a hx of afib but his notes indicate that he was in [place] with a controlled rate prior to discharge. He was admitted to the hospital 06/07/17-06/12/17 after a fall with subsequent closed right intertrochanteric fracture of the right hip. Surgery with IM nailing was performed on 10/21.  He has a hx significant for CKD IV with a CR running in the 4-5 range., diastolic CHF, aortic stenosis, chronic dvt of the left lower ext (03/2017), htn, anemia, and prostate ca s/p XRT therapy. He is here in rehab at [facility] for PT/OT with hopes to return to his independent living environment.  Anemia: H/H 7.8 06/15/2017 down from 8.8 on 10/27.  He is on aspirin BID for DVT prophylaxis due to a hx of retroperitoneal bleeding after anticoagulation. Has had two blood transfusions during his hospital stay. His BUN/CR 114/4.6 06/15/2017 91/4.84 06/13/17.  He is followed by   Nephrology and cardiology at [facility]. He apparently has DOE chronically due to his hx of CKD and CHF but now it seems worse by his account.  His weight has been stable at the facility at 125 lbs. However, notes from [place] indicate he was closer to 118-120 lbs prior to hospital admission.  12 lead ekg sinus tach 125, inverted t wave in V2, biphasiic T wave in V3, slight ST dep in V4, marked ST elevation V2   Past Medical History:  Diagnosis Date  . Anemia   . Arthritis   . Atrial fibrillation ([place])   . Cataract   . CHF (congestive heart failure) ([place])   . CKD (chronic kidney disease)    Follows with nephrologists.  . DVT (deep venous thrombosis) (HCC)    LLE  . Heart murmur   . History of blood transfusion ~ 01/2017; 05/2017   "blood loss; anemia"  . Hypertension   . Pneumonia 12/2016   "a touch"  . Prostate cancer [facility])     follows with hematologists q 6 months. S/p radical prostatectomy and proton therapy in 2001.  . Radiation cystitis   . Retinal hemorrhage of both eyes    follows with ophthalmologists.   Past Surgical History:  Procedure Laterality Date  . INGUINAL HERNIA REPAIR    . INTERTROCHANTERIC HIP FRACTURE SURGERY Right 06/07/2017  . INTRAMEDULLARY (IM) NAIL INTERTROCHANTERIC Right 06/07/2017   Procedure: INTRAMEDULLARY (IM) NAIL INTERTROCHANTRIC;  Surgeon: [name], MD;  Location: [facility];  Service: Orthopedics;  Laterality: Right;  . PROSTATECTOMY    .  TONSILLECTOMY      Allergies  Allergen Reactions  . Iodinated Diagnostic Agents Rash    Other Reaction: Allergy  . Penicillins Rash    Outpatient Encounter Prescriptions as of 06/15/2017  Medication Sig  . albuterol (PROVENTIL HFA;VENTOLIN HFA) 108 (90 Base) MCG/ACT inhaler Inhale 2 puffs into the lungs every 6 (six) hours as needed for wheezing or shortness of breath.  Marland Kitchen amLODipine (NORVASC) 10 MG tablet Take 1 tablet (10 mg total) by mouth daily.  Marland Kitchen aspirin EC 81 MG tablet Take 1 tablet (81 mg total) by  mouth 2 (two) times daily.  . B Complex Vitamins (VITAMIN-B COMPLEX PO) Take 1 tablet by mouth daily.   . Bacillus Coagulans-Inulin (PROBIOTIC FORMULA) 1-250 BILLION-MG CAPS Take 250 mg by mouth daily.  . bisacodyl (DULCOLAX) 5 MG EC tablet Take 1 tablet (5 mg total) by mouth daily as needed for moderate constipation.  . Cholecalciferol (VITAMIN D3) 5000 units TABS Take 5,000 Units by mouth daily.   . Coenzyme Q10 (CO Q 10) 100 MG CAPS Take 100 mg by mouth daily.  Marland Kitchen doxazosin (CARDURA) 1 MG tablet Take 1 mg by mouth every evening.  Water engineer Bandages & Supports (LUMBAR BACK BRACE/SUPPORT PAD) MISC Use as directed for lumbar scoliosis  . ferrous sulfate 325 (65 FE) MG tablet Take 1 tablet (325 mg total) by mouth daily with breakfast.  . furosemide (LASIX) 40 MG tablet Take 40 mg by mouth daily.  . hydrALAZINE (APRESOLINE) 50 MG tablet Take 1 tablet (50 mg total) by mouth 3 (three) times daily.  . isosorbide mononitrate (IMDUR) 60 MG 24 hr tablet Take 60 mg by mouth daily.  . Multiple Vitamin (MULTI VITAMIN PO) Take 1 tablet by mouth daily.   . Omega 3 1000 MG CAPS Take 2,000 mg by mouth daily.  . polyethylene glycol (MIRALAX / GLYCOLAX) packet Take 17 g by mouth daily.  . traMADol (ULTRAM) 50 MG tablet Take 1 tablet (50 mg total) by mouth every 6 (six) hours as needed for moderate pain.   No facility-administered encounter medications on file as of 06/15/2017.     Review of Systems  Constitutional: Negative for activity change, appetite change, chills, diaphoresis, fatigue, fever and unexpected weight change.  Respiratory: Positive for shortness of breath. Negative for cough, wheezing and stridor.   Cardiovascular: Positive for leg swelling. Negative for chest pain and palpitations.       Feels heart racing  Gastrointestinal: Negative for abdominal distention, abdominal pain, constipation and diarrhea.  Genitourinary: Negative for difficulty urinating and dysuria.  Musculoskeletal: Positive  for arthralgias and gait problem. Negative for back pain, joint swelling and myalgias.  Neurological: Negative for dizziness, seizures, syncope, facial asymmetry, speech difficulty, weakness and headaches.  Hematological: Negative for adenopathy. Does not bruise/bleed easily.  Psychiatric/Behavioral: Negative for agitation, behavioral problems and confusion.    Immunization History  Administered Date(s) Administered  . Influenza-Unspecified 04/19/2015  . Pneumococcal Conjugate-13 11/30/2013  . Pneumococcal Polysaccharide-23 05/15/2008  . Tdap 04/29/2011  . Zoster 11/12/2007   Pertinent  Health Maintenance Due  Topic Date Due  . FOOT EXAM  06/26/1930  . OPHTHALMOLOGY EXAM  06/26/1930  . HEMOGLOBIN A1C  03/12/2017  . INFLUENZA VACCINE  03/18/2017  . URINE MICROALBUMIN  08/20/2017  . PNA vac Low Risk Adult  Completed   Fall Risk  12/24/2015  Falls in the past year? No  Risk for fall due to : Other (Comment)  Risk for fall due to: Comment Wears glasses  Functional Status Survey:    Vitals:   06/15/17 1405  BP: 114/66  Pulse: (!) 122  Resp: 20  Temp: 98.1 F (36.7 C)  Weight: 125 lb 12.8 oz (57.1 kg)   Body mass index is 21.59 kg/m.  Wt Readings from Last 3 Encounters:  06/15/17 125 lb 12.8 oz (57.1 kg)  06/12/17 135 lb (61.2 kg)  12/22/16 125 lb 4 oz (56.8 kg)    Physical Exam  Constitutional: He is oriented to person, place, and time. No distress.  HENT:  Head: Normocephalic and atraumatic.  Mouth/Throat: No oropharyngeal exudate.  Neck: Normal range of motion. Neck supple. No JVD present. No tracheal deviation present. No thyromegaly present.  Cardiovascular: Regular rhythm.   Murmur heard. HR 120's  Pulmonary/Chest: Effort normal. No respiratory distress. He has no wheezes.  Slight increased work of breathing, fine crackles to the bases  Abdominal: Soft. Bowel sounds are normal. He exhibits no distension. There is no tenderness.  Musculoskeletal:  BLE edema  +1 to both ankles slightly worse on the right.    Lymphadenopathy:    He has no cervical adenopathy.  Neurological: He is alert and oriented to person, place, and time. No cranial nerve deficit.  Skin: Skin is warm and dry. He is not diaphoretic.  Both incisions to the right lower ext with staples CDI  Psychiatric: He has a normal mood and affect.    Labs reviewed:  Recent Labs  06/10/17 0247 06/11/17 0434 06/12/17 0308 06/15/17  NA 135 137 136 139  K 4.1 3.6 3.6 4.2  CL 107 108 108  --   CO2 17* 17* 18*  --   GLUCOSE 115* 104* 124*  --   BUN 84* 88* 91* 114*  CREATININE 4.95* 5.15* 4.86* 4.6*  CALCIUM 8.0* 8.2* 8.4*  --   PHOS  --   --  3.6  --     Recent Labs  07/29/16 08/20/16 1221 06/07/17 1022 06/12/17 0308  AST 18 19 30   --   ALT 8* 9 18  --   ALKPHOS 68 61 62  --   BILITOT  --  0.5 0.9  --   PROT  --  6.3 6.6  --   ALBUMIN  --  3.7 3.3* 2.4*    Recent Labs  12/22/16 1453 06/07/17 1022  06/10/17 0247 06/11/17 0434 06/12/17 0308 06/15/17  WBC 6.9 7.2  < > 5.8 4.9 4.9 8.7  NEUTROABS 4.9 5.4  --   --  3.0  --   --   HGB 11.4* 9.0*  < > 7.1* 7.9* 7.9* 7.8*  HCT 33.7* 25.6*  < > 20.3* 22.3* 22.3* 24*  MCV 87.9 80.3  < > 81.9 80.8 81.4  --   PLT 295.0 148*  < > 91* 89* 100* 141*  < > = values in this interval not displayed. Lab Results  Component Value Date   TSH 2.53 12/12/2015   Lab Results  Component Value Date   HGBA1C 5.8 09/12/2016   Lab Results  Component Value Date   CHOL 211 (A) 07/29/2016   HDL 99 (A) 07/29/2016   LDLCALC 97 07/29/2016   TRIG 76 07/29/2016    Significant Diagnostic Results in last 30 days:  Dg Chest 1 View  Result Date: 06/07/2017 CLINICAL DATA:  Acute right hip fracture. Pre-op respiratory exam. Prostate carcinoma. EXAM: CHEST 1 VIEW COMPARISON:  09/30/2016 FINDINGS: Heart size is stable.  Severe thoracolumbar dextroscoliosis. Diffuse nodular pulmonary interstitial prominence is new since  previous study. This is  nonspecific, and may be due to pulmonary edema, viral pneumonitis, or possibly metastatic disease. No evidence of pneumothorax or pleural effusion. IMPRESSION: New diffuse nodular pulmonary interstitial prominence, which is nonspecific. Differential considerations include pulmonary edema, viral pneumonitis, or possibly metastatic disease. Recommend clinical correlation, and consider continued chest radiographic followup versus chest CT. Electronically Signed   By: Earle Gell M.D.   On: 06/07/2017 11:49   Ct Head Wo Contrast  Result Date: 06/07/2017 CLINICAL DATA:  Recent fall, trauma, high risk for injury EXAM: CT HEAD WITHOUT CONTRAST CT CERVICAL SPINE WITHOUT CONTRAST TECHNIQUE: Multidetector CT imaging of the head and cervical spine was performed following the standard protocol without intravenous contrast. Multiplanar CT image reconstructions of the cervical spine were also generated. COMPARISON:  None available FINDINGS: CT HEAD FINDINGS Brain: Brain atrophy evident with advanced chronic white matter microvascular ischemic changes throughout both cerebral hemispheres. No acute intracranial hemorrhage, mass lesion, definite new infarction, midline shift, herniation, hydrocephalus, or extra-axial fluid collection. No focal mass effect or edema. Cisterns are patent. Cerebellar atrophy as well. Vascular: Intracranial atherosclerosis noted.  No hyperdense vessel. Skull: No depressed skull fracture.  Left mastoid effusion noted. Sinuses/Orbits: Chronic scattered sinus mucosal thickening. Orbits are unremarkable and symmetric. Other: None. CT CERVICAL SPINE FINDINGS Alignment: Normal alignment. No subluxation or dislocation. Facets are aligned. Multilevel facet arthropathy noted. Skull base and vertebrae: Bones are osteopenic. No acute osseous finding or fracture. Soft tissues and spinal canal: No prevertebral fluid or swelling. No visible canal hematoma. Carotid atherosclerosis evident. Disc levels: Multilevel  degenerative spondylosis noted with disc space narrowing, sclerosis and endplate osteophytes spanning C2-C7 at all levels. Facet arthropathy most pronounced at C4-5 on the left. Upper chest: No acute finding.  Apical scarring. Other: None. IMPRESSION: Brain atrophy and chronic white matter microvascular changes. No acute intracranial abnormality by noncontrast CT Nonspecific left mastoid effusion Minor chronic sinus disease Cervical degenerative spondylosis and osteopenia without acute osseous finding, fracture or malalignment by CT. Electronically Signed   By: Jerilynn Mages.  Shick M.D.   On: 06/07/2017 11:35   Ct Cervical Spine Wo Contrast  Result Date: 06/07/2017 CLINICAL DATA:  Recent fall, trauma, high risk for injury EXAM: CT HEAD WITHOUT CONTRAST CT CERVICAL SPINE WITHOUT CONTRAST TECHNIQUE: Multidetector CT imaging of the head and cervical spine was performed following the standard protocol without intravenous contrast. Multiplanar CT image reconstructions of the cervical spine were also generated. COMPARISON:  None available FINDINGS: CT HEAD FINDINGS Brain: Brain atrophy evident with advanced chronic white matter microvascular ischemic changes throughout both cerebral hemispheres. No acute intracranial hemorrhage, mass lesion, definite new infarction, midline shift, herniation, hydrocephalus, or extra-axial fluid collection. No focal mass effect or edema. Cisterns are patent. Cerebellar atrophy as well. Vascular: Intracranial atherosclerosis noted.  No hyperdense vessel. Skull: No depressed skull fracture.  Left mastoid effusion noted. Sinuses/Orbits: Chronic scattered sinus mucosal thickening. Orbits are unremarkable and symmetric. Other: None. CT CERVICAL SPINE FINDINGS Alignment: Normal alignment. No subluxation or dislocation. Facets are aligned. Multilevel facet arthropathy noted. Skull base and vertebrae: Bones are osteopenic. No acute osseous finding or fracture. Soft tissues and spinal canal: No  prevertebral fluid or swelling. No visible canal hematoma. Carotid atherosclerosis evident. Disc levels: Multilevel degenerative spondylosis noted with disc space narrowing, sclerosis and endplate osteophytes spanning C2-C7 at all levels. Facet arthropathy most pronounced at C4-5 on the left. Upper chest: No acute finding.  Apical scarring. Other: None. IMPRESSION: Brain atrophy and chronic white matter microvascular changes. No  acute intracranial abnormality by noncontrast CT Nonspecific left mastoid effusion Minor chronic sinus disease Cervical degenerative spondylosis and osteopenia without acute osseous finding, fracture or malalignment by CT. Electronically Signed   By: Jerilynn Mages.  Shick M.D.   On: 06/07/2017 11:35   Dg C-arm 1-60 Min  Result Date: 06/07/2017 CLINICAL DATA:  Right hip fracture fixation EXAM: DG C-ARM 61-120 MIN; OPERATIVE RIGHT HIP WITH PELVIS COMPARISON:  None. FINDINGS: There are 4 C-arm fluoroscopic views provided from a right femoral nail fixation of a comminuted intertrochanteric fracture of the proximal femur with avulsed lesser trochanter. Less varus angulation is currently identified on these images. No evidence of hardware failure or immediate postoperative fracture. Improved alignment is demonstrated though there is still some cephalad displacement of the femoral shaft relative to the femoral neck. IMPRESSION: Status post nail fixation of intertrochanteric comminuted fracture the right femur. Electronically Signed   By: Ashley Royalty M.D.   On: 06/07/2017 23:13   Dg Hip Operative Unilat W Or W/o Pelvis Right  Result Date: 06/07/2017 CLINICAL DATA:  Right hip fracture fixation EXAM: DG C-ARM 61-120 MIN; OPERATIVE RIGHT HIP WITH PELVIS COMPARISON:  None. FINDINGS: There are 4 C-arm fluoroscopic views provided from a right femoral nail fixation of a comminuted intertrochanteric fracture of the proximal femur with avulsed lesser trochanter. Less varus angulation is currently identified on  these images. No evidence of hardware failure or immediate postoperative fracture. Improved alignment is demonstrated though there is still some cephalad displacement of the femoral shaft relative to the femoral neck. IMPRESSION: Status post nail fixation of intertrochanteric comminuted fracture the right femur. Electronically Signed   By: Ashley Royalty M.D.   On: 06/07/2017 23:13   Dg Hip Unilat  With Pelvis 2-3 Views Right  Result Date: 06/07/2017 CLINICAL DATA:  Followup getting out of bed this morning. Right hip pain. Initial encounter. EXAM: DG HIP (WITH OR WITHOUT PELVIS) 2-3V RIGHT COMPARISON:  None. FINDINGS: Comminuted intertrochanteric fracture of the right hip is seen. No evidence of dislocation. No pelvic fracture identified. Generalized osteopenia noted. Bilateral pelvic surgical clips are seen from previous lymphadenectomy. Peripheral vascular calcification noted in right thigh. IMPRESSION: Comminuted intertrochanteric right hip fracture. Electronically Signed   By: Earle Gell M.D.   On: 06/07/2017 11:43    Assessment/Plan  1. Tachycardia Appears slightly dry on exam but given his propensity to fluid overload it would not be safe to hydrate him in this setting This may also be due to his hx of afib and anemia. We have tried Diltiazem 30 mg two times with no relief. He will need to go to the ER for further evaluation. He is at risk for a cardiac event and would like to remain a full code. He is also mildly sob and at risk for PE.  Given his complicated health picture and age his code status will need to be readdressed in the future. He is not ready to make this change at this time.   2. AF (paroxysmal atrial fibrillation) (HCC) EKG appeared to be in sinus while at [facility] Can not take anticoagulation due to risk of bleeding.  3. Closed intertrochanteric fracture of hip, right, initial encounter ([place]) S/P IM nailing. Worked with PT this am and was walking with mild pain  4.  Anemia of chronic disease Hgb down 1 gram Consider iron panel  5. Chronic kidney disease (CKD), stage IV (severe) (HCC) BUN rising, Cr rather stable Followed by nephrology at [facility].  6. History of DVT of  lower extremity, left Not on anticoagulation due to bleeding risk.  ?IVC filter  7. Chronic diastolic congestive heart failure ([place]) Has not received any prn lasix since arriving at [facility] due to stable weight.  Not on an ace or arb due to CKD  Family/ staff Communication: discussed with resident and his wife Dr. Edmonia James  Labs/tests ordered:  NA

## 2017-06-15 NOTE — ED Notes (Signed)
Date and time results received: 06/15/17 7:04 PM   Test: Troponin Critical Value: 0.09  Name of Provider Notified: [facility]  Orders Received? Or Actions Taken?:

## 2017-06-15 NOTE — ED Triage Notes (Signed)
Pt arrived via [facility] from [facility] where pt has c/o weakness X2 days and tachycardia X2 days. Pt. Had hip surgery X1 week ago. Denies CP, denies dizziness, denies being lightheaded.

## 2017-06-15 NOTE — ED Provider Notes (Signed)
[name] DEPARTMENT Provider Note   CSN: [id number] Arrival date & time: 06/15/17  1705     History   Chief Complaint Chief Complaint  Patient presents with  . Weakness    HPI Johnny Morrow is a 83 y.o. male.  The history is provided by the patient, the EMS personnel and medical records. No language interpreter was used.  Weakness  Pertinent negatives include no chest pain.   Johnny Morrow is a 84 y.o. male  with a PMH of HTN, CKD, paroxysmal a.fib, CHF, prior DVT who presents to the Emergency Department from facility for weakness and tachycardia x 2 days. Of note, patient was admitted to the hospital from 10/21-10/26 for fall with surgical repair of comminuted right hip fracture by Dr. Doran Durand on 06/07/17. No other anticoagulants. He did received two blood transfusions during his hospital stay.  History of retroperitoneal bleeding after anticoagulation, therefore it was decided to keep him on 81 mg aspirin twice daily for prophylaxis. Patient states that he started feeling very tired on Saturday (two days ago). Other than feeling tired and pain to the right hip, patient without complaints. Facility reported heart rate in the 120's for the last day or two. He was given 30mg  diltiazem twice with no relief, therefore sent to ER for further evaluation. Patient denies any chest pain, shortness of breath, headache, abdominal pain, n/v.    Past Medical History:  Diagnosis Date  . Anemia   . Arthritis   . Atrial fibrillation ([place])   . Cataract   . CHF (congestive heart failure) ([place])   . CKD (chronic kidney disease)    Follows with nephrologists.  . DVT (deep venous thrombosis) ([facility])    LLE  . Heart murmur   . History of blood transfusion ~ 01/2017; 05/2017   "blood loss; anemia"  . Hypertension   . Pneumonia 12/2016   "a touch"  . Prostate cancer [facility]     follows with hematologists q 6 months. S/p radical prostatectomy and proton therapy in 2001.  .  Radiation cystitis   . Retinal hemorrhage of both eyes    follows with ophthalmologists.    Patient Active Problem List   Diagnosis Date Noted  . Tachycardia 06/15/2017  . Closed intertrochanteric fracture of hip, right, initial encounter ([place]) 06/07/2017  . Acute on chronic diastolic CHF (congestive heart failure) ([place]) 06/07/2017  . AF (paroxysmal atrial fibrillation) ([place]) 12/22/2016  . Pulmonary nodule less than 6 cm determined by computed tomography of lung 11/27/2016  . Normocytic anemia 11/27/2016  . Chronic kidney disease (CKD), stage IV (severe) ([place]) 08/20/2016  . Hyperlipidemia 08/20/2016  . Moderate tricuspid regurgitation 01/11/2016  . Mild aortic stenosis 01/11/2016  . Mild aortic regurgitation 01/11/2016  . Osteoporosis 01/11/2016  . Prediabetes 12/19/2015  . Essential hypertension, benign 09/25/2015  . History of prostate cancer 09/25/2015  . Idiopathic scoliosis, thoracic 09/25/2015  . History of DVT of lower extremity, left 09/25/2015    Past Surgical History:  Procedure Laterality Date  . INGUINAL HERNIA REPAIR    . INTERTROCHANTERIC HIP FRACTURE SURGERY Right 06/07/2017  . INTRAMEDULLARY (IM) NAIL INTERTROCHANTERIC Right 06/07/2017   Procedure: INTRAMEDULLARY (IM) NAIL INTERTROCHANTRIC;  Surgeon: [name], MD;  Location: [facility];  Service: Orthopedics;  Laterality: Right;  . PROSTATECTOMY    . TONSILLECTOMY         Home Medications    Prior to Admission medications   Medication Sig Start Date End Date Taking? Authorizing Provider  albuterol (PROVENTIL HFA;VENTOLIN HFA) 108 (90 Base) MCG/ACT inhaler Inhale 2 puffs into the lungs every 6 (six) hours as needed for wheezing or shortness of breath. 08/20/16   Martinique, Betty G, MD  amLODipine (NORVASC) 10 MG tablet Take 1 tablet (10 mg total) by mouth daily. 03/09/17   Martinique, Betty G, MD  aspirin EC 81 MG tablet Take 1 tablet (81 mg total) by mouth 2 (two) times daily. 06/08/17   Corky Sing, PA-C  B  Complex Vitamins (VITAMIN-B COMPLEX PO) Take 1 tablet by mouth daily.     [provider]  Bacillus Coagulans-Inulin (PROBIOTIC FORMULA) 1-250 BILLION-MG CAPS Take 250 mg by mouth daily.    [provider]  bisacodyl (DULCOLAX) 5 MG EC tablet Take 1 tablet (5 mg total) by mouth daily as needed for moderate constipation. 06/12/17   Nita Sells, MD  Cholecalciferol (VITAMIN D3) 5000 units TABS Take 5,000 Units by mouth daily.     [provider]  Coenzyme Q10 (CO Q 10) 100 MG CAPS Take 100 mg by mouth daily.    [provider]  doxazosin (CARDURA) 1 MG tablet Take 1 mg by mouth every evening.    [provider]  Elastic Bandages & Supports (LUMBAR BACK BRACE/SUPPORT PAD) MISC Use as directed for lumbar scoliosis 09/25/15   [place]innie Rail, MD  ferrous sulfate 325 (65 FE) MG tablet Take 1 tablet (325 mg total) by mouth daily with breakfast. 12/22/16   Martinique, Betty G, MD  furosemide (LASIX) 40 MG tablet Take 40 mg by mouth daily.    [provider]  hydrALAZINE (APRESOLINE) 50 MG tablet Take 1 tablet (50 mg total) by mouth 3 (three) times daily. 12/22/16   Martinique, Betty G, MD  isosorbide mononitrate (IMDUR) 60 MG 24 hr tablet Take 60 mg by mouth daily.    [provider]  Multiple Vitamin (MULTI VITAMIN PO) Take 1 tablet by mouth daily.     [provider]  Omega 3 1000 MG CAPS Take 2,000 mg by mouth daily.    [provider]  polyethylene glycol (MIRALAX / GLYCOLAX) packet Take 17 g by mouth daily. 06/12/17   Nita Sells, MD  traMADol (ULTRAM) 50 MG tablet Take 1 tablet (50 mg total) by mouth every 6 (six) hours as needed for moderate pain. 06/09/17   Corky Sing, PA-C    Family History Family History  Problem Relation Age of Onset  . COPD Brother   . Heart attack Brother   . Cancer Father   . Heart attack Father   . Diabetes Mother     Social History Social History  Substance Use Topics  .  Smoking status: Former Smoker    Types: Cigarettes    Quit date: 51  . Smokeless tobacco: Never Used  . Alcohol use 0.6 oz/week    1 Glasses of wine per week     Allergies   Iodinated diagnostic agents and Penicillins   Review of Systems Review of Systems  Constitutional: Positive for fatigue.  Cardiovascular: Negative for chest pain and palpitations.  Musculoskeletal: Positive for arthralgias.  Neurological: Positive for weakness.  All other systems reviewed and are negative.    Physical Exam Updated Vital Signs BP 110/79   Pulse 81   Temp 99 F (37.2 C) (Rectal)   Resp (!) 28   Ht 5\' 4"  (1.626 m)   Wt 56.7 kg (125 lb)   SpO2 97%   BMI 21.46 kg/m  Physical Exam  Constitutional: He is oriented to person, place, and time. He appears well-developed and well-nourished. No distress.  HENT:  Head: Normocephalic and atraumatic.  Cardiovascular: Normal heart sounds.   No murmur heard. Tachycardic but regular.  Pulmonary/Chest: Effort normal. No respiratory distress.  Faint crackles to both lung bases.   Abdominal: Soft. He exhibits no distension. There is no tenderness.  Musculoskeletal: He exhibits no edema.  Neurological: He is alert and oriented to person, place, and time.  Skin: Skin is warm and dry.  Surgical incisions with staples in place to right hip c/d/i with no surrounding erythema.  Nursing note and vitals reviewed.    ED Treatments / Results  Labs (all labs ordered are listed, but only abnormal results are displayed) Labs Reviewed  CBC WITH DIFFERENTIAL/PLATELET - Abnormal; Notable for the following:       Result Value   RBC 2.48 (*)    Hemoglobin 7.0 (*)    HCT 20.4 (*)    All other components within normal limits  BASIC METABOLIC PANEL - Abnormal; Notable for the following:    Sodium 134 (*)    CO2 17 (*)    Glucose, Bld 118 (*)    BUN 123 (*)    Creatinine, Ser 4.56 (*)    Calcium 8.6 (*)    GFR calc non Af Amer 10 (*)    GFR calc Af  Amer 12 (*)    All other components within normal limits  TROPONIN I - Abnormal; Notable for the following:    Troponin I 0.06 (*)    All other components within normal limits  POC OCCULT BLOOD, ED  TYPE AND SCREEN    EKG  EKG Interpretation  Date/Time:  Monday June 15 2017 17:13:49 EDT Ventricular Rate:  125 PR Interval:    QRS Duration: 128 QT Interval:  385 QTC Calculation: 556 R Axis:   8 Text Interpretation:  Sinus or ectopic atrial tachycardia Right bundle branch block Nonspecific T abnormalities, lateral leads NO STEMI No old tracing to compare Confirmed by Addison Lank [phone]) on 06/15/2017 5:25:00 PM       Radiology Dg Chest Portable 1 View  Result Date: 06/15/2017 CLINICAL DATA:  Tachycardia EXAM: PORTABLE CHEST 1 VIEW COMPARISON:  06/07/2017 FINDINGS: Prior interstitial prominence has improved/resolved, suggesting resolution of interstitial edema. Mild patchy bilateral lower lobe opacities, likely atelectasis. No definite pleural effusions. No pneumothorax. Cardiomegaly. Midthoracic dextroscoliosis. IMPRESSION: Prior interstitial edema has resolved. Mild patchy bilateral lower lobe opacities, likely atelectasis. Electronically Signed   By: Julian Hy M.D.   On: 06/15/2017 18:06    Procedures Procedures (including critical care time)  Medications Ordered in ED Medications  sodium chloride 0.9 % bolus 250 mL (250 mLs Intravenous New Bag/Given 06/15/17 1841)     Initial Impression / Assessment and Plan / ED Course  I have reviewed the triage vital signs and the nursing notes.  Pertinent labs & imaging results that were available during my care of the patient were reviewed by me and considered in my medical decision making (see chart for details).    Johnny Morrow is a 83 y.o. male who presents to ED from facility for persistent tachycardia and weakness for the last 1-2 days. Patient with recent hospitalization from 10/21-10/26 where he underwent repair  of closed intertrochanteric hip fracture by Dr. Doran Durand. Hx of chronic DVT on 81mg  ASA twice daily. HR in 120's upon ED arrival. Afebrile without hypoxia. EKG with sinus tachycardia and nonspecific t  changes. Denies chest pain or shortness of breath. Given recent surgery and history of DVT, tachycardia concerning for PE. Unable to have contrast due to kidney function, therefore will order VQ scan for further evaluation.  Repeat exam with HR in 80's. No chest pain or shortness of breath.   Troponin elevated at 0.06, ddx include ACS, PE or demand ischemia. Patient with hx of retroperitoneal bleed when on more aggressive anticoagulation.  Hospitalist consulted who will admit.    I was informed nuclear medicine was not able to perform VQ scans at night, therefore VQ scan pending.  Patient seen by and discussed with Dr. Leonette Monarch who agrees with treatment plan.   Final Clinical Impressions(s) / ED Diagnoses   Final diagnoses:  Weakness  Tachycardia  Elevated troponin  Anemia, unspecified type    New Prescriptions New Prescriptions   No medications on file     Ward, Ozella Almond, PA-C 06/15/17 2011    Fatima Blank, MD 06/15/17 [phone]

## 2017-06-16 ENCOUNTER — Observation Stay (HOSPITAL_COMMUNITY): Payer: Medicare Other

## 2017-06-16 ENCOUNTER — Encounter (HOSPITAL_COMMUNITY): Payer: Self-pay

## 2017-06-16 DIAGNOSIS — R000 Tachycardia, unspecified: Secondary | ICD-10-CM | POA: Diagnosis not present

## 2017-06-16 LAB — CBC
HEMATOCRIT: 23.1 % — AB (ref 39.0–52.0)
Hemoglobin: 8 g/dL — ABNORMAL LOW (ref 13.0–17.0)
MCH: 28.4 pg (ref 26.0–34.0)
MCHC: 34.6 g/dL (ref 30.0–36.0)
MCV: 81.9 fL (ref 78.0–100.0)
Platelets: 163 10*3/uL (ref 150–400)
RBC: 2.82 MIL/uL — ABNORMAL LOW (ref 4.22–5.81)
RDW: 14 % (ref 11.5–15.5)
WBC: 5.4 10*3/uL (ref 4.0–10.5)

## 2017-06-16 LAB — BASIC METABOLIC PANEL
Anion gap: 10 (ref 5–15)
BUN: 119 mg/dL — AB (ref 6–20)
CALCIUM: 8.4 mg/dL — AB (ref 8.9–10.3)
CO2: 17 mmol/L — AB (ref 22–32)
Chloride: 108 mmol/L (ref 101–111)
Creatinine, Ser: 4.39 mg/dL — ABNORMAL HIGH (ref 0.61–1.24)
GFR calc Af Amer: 12 mL/min — ABNORMAL LOW (ref >60)
GFR, EST NON AFRICAN AMERICAN: 10 mL/min — AB (ref >60)
GLUCOSE: 127 mg/dL — AB (ref 65–99)
POTASSIUM: 3.7 mmol/L (ref 3.5–5.1)
Sodium: 135 mmol/L (ref 135–145)

## 2017-06-16 LAB — TROPONIN I
TROPONIN I: 0.12 ng/mL — AB (ref <0.03)
Troponin I: 0.15 ng/mL (ref <0.03)

## 2017-06-16 LAB — TSH: TSH: 4.056 u[IU]/mL (ref 0.350–4.500)

## 2017-06-16 LAB — MRSA PCR SCREENING: MRSA BY PCR: NEGATIVE

## 2017-06-16 MED ORDER — SODIUM CHLORIDE 0.9 % IV SOLN
INTRAVENOUS | Status: DC
  Administered 2017-06-16: 18:00:00 via INTRAVENOUS

## 2017-06-16 MED ORDER — ISOSORBIDE MONONITRATE ER 30 MG PO TB24
30.0000 mg | ORAL_TABLET | Freq: Every day | ORAL | Status: DC
  Administered 2017-06-16 – 2017-06-22 (×7): 30 mg via ORAL
  Filled 2017-06-16 (×7): qty 1

## 2017-06-16 MED ORDER — FERROUS SULFATE 325 (65 FE) MG PO TABS
325.0000 mg | ORAL_TABLET | Freq: Every day | ORAL | Status: DC
  Administered 2017-06-16 – 2017-06-22 (×7): 325 mg via ORAL
  Filled 2017-06-16 (×8): qty 1

## 2017-06-16 MED ORDER — RISAQUAD PO CAPS
1.0000 | ORAL_CAPSULE | Freq: Every day | ORAL | Status: DC
  Administered 2017-06-16 – 2017-06-22 (×7): 1 via ORAL
  Filled 2017-06-16 (×8): qty 1

## 2017-06-16 MED ORDER — LEVALBUTEROL HCL 0.63 MG/3ML IN NEBU: 0.6300 mg | INHALATION_SOLUTION | Freq: Four times a day (QID) | RESPIRATORY_TRACT | Status: DC | PRN

## 2017-06-16 MED ORDER — SODIUM CHLORIDE 0.9% FLUSH
3.0000 mL | Freq: Two times a day (BID) | INTRAVENOUS | Status: DC
  Administered 2017-06-16 – 2017-06-22 (×10): 3 mL via INTRAVENOUS

## 2017-06-16 MED ORDER — METOPROLOL TARTRATE 25 MG PO TABS: 12.5000 mg | ORAL_TABLET | Freq: Two times a day (BID) | ORAL | Status: DC

## 2017-06-16 MED ORDER — TRAMADOL HCL 50 MG PO TABS
50.0000 mg | ORAL_TABLET | Freq: Four times a day (QID) | ORAL | Status: DC | PRN
  Administered 2017-06-16 – 2017-06-22 (×7): 50 mg via ORAL
  Filled 2017-06-16 (×7): qty 1

## 2017-06-16 MED ORDER — HYDRALAZINE HCL 25 MG PO TABS: 50.0000 mg | ORAL_TABLET | Freq: Three times a day (TID) | ORAL | Status: DC

## 2017-06-16 MED ORDER — POLYETHYLENE GLYCOL 3350 17 G PO PACK
17.0000 g | PACK | Freq: Every day | ORAL | Status: DC
  Administered 2017-06-17 – 2017-06-22 (×5): 17 g via ORAL
  Filled 2017-06-16 (×6): qty 1

## 2017-06-16 MED ORDER — FUROSEMIDE 40 MG PO TABS
40.0000 mg | ORAL_TABLET | Freq: Every day | ORAL | Status: DC
  Administered 2017-06-16: 40 mg via ORAL
  Filled 2017-06-16: qty 1

## 2017-06-16 MED ORDER — AMLODIPINE BESYLATE 5 MG PO TABS: 10.0000 mg | ORAL_TABLET | Freq: Every day | ORAL | Status: DC

## 2017-06-16 MED ORDER — METOPROLOL SUCCINATE ER 25 MG PO TB24
25.0000 mg | ORAL_TABLET | Freq: Every day | ORAL | Status: DC
  Administered 2017-06-16 – 2017-06-17 (×2): 25 mg via ORAL
  Filled 2017-06-16 (×3): qty 1

## 2017-06-16 MED ORDER — VITAMIN D 1000 UNITS PO TABS
5000.0000 [IU] | ORAL_TABLET | Freq: Every day | ORAL | Status: DC
  Administered 2017-06-16 – 2017-06-22 (×7): 5000 [IU] via ORAL
  Filled 2017-06-16 (×6): qty 5

## 2017-06-16 MED ORDER — DOXAZOSIN MESYLATE 2 MG PO TABS
1.0000 mg | ORAL_TABLET | Freq: Every evening | ORAL | Status: DC
  Administered 2017-06-16 – 2017-06-20 (×5): 1 mg via ORAL
  Filled 2017-06-16 (×6): qty 1

## 2017-06-16 MED ORDER — ASPIRIN EC 81 MG PO TBEC
81.0000 mg | DELAYED_RELEASE_TABLET | Freq: Two times a day (BID) | ORAL | Status: DC
  Administered 2017-06-16 – 2017-06-22 (×13): 81 mg via ORAL
  Filled 2017-06-16 (×13): qty 1

## 2017-06-16 MED ORDER — OMEGA-3-ACID ETHYL ESTERS 1 G PO CAPS
2000.0000 mg | ORAL_CAPSULE | Freq: Every day | ORAL | Status: DC
  Filled 2017-06-16: qty 2

## 2017-06-16 MED ORDER — ISOSORBIDE MONONITRATE ER 30 MG PO TB24: 60.0000 mg | ORAL_TABLET | Freq: Every day | ORAL | Status: DC

## 2017-06-16 MED ORDER — SODIUM CHLORIDE 0.9 % IV SOLN: 250.0000 mL | INTRAVENOUS | Status: DC | PRN

## 2017-06-16 MED ORDER — SODIUM CHLORIDE 0.9% FLUSH: 3.0000 mL | INTRAVENOUS | Status: DC | PRN

## 2017-06-16 MED ORDER — CO Q 10 100 MG PO CAPS: 100.0000 mg | ORAL_CAPSULE | Freq: Every day | ORAL | Status: DC

## 2017-06-16 MED ORDER — BISACODYL 5 MG PO TBEC
5.0000 mg | DELAYED_RELEASE_TABLET | Freq: Every day | ORAL | Status: DC | PRN
  Administered 2017-06-18 – 2017-06-22 (×3): 5 mg via ORAL
  Filled 2017-06-16 (×3): qty 1

## 2017-06-16 NOTE — Progress Notes (Signed)
Received report from ED nurse, T. Martinique for pt.  All questions answered.

## 2017-06-16 NOTE — Progress Notes (Signed)
Dr. Cammy Brochure called to get an update on her father. This RN spoke with patient and daughter.  Daughter made aware that patient was dehydrated and would be getting some gentle hydration. Daughter concerned that she was told patient was in atrial fibrillation. Patient aware that he has been in sinus tachycardia for me since arrival to unit. Page sent to Dr. Verlon Au requesting daughter get call with update on plan of care.Pt resting with call bell within reach.  Will continue to monitor. Payton Emerald, RN

## 2017-06-16 NOTE — Progress Notes (Signed)
Patient transferred from regular bed to low bed for safety concerns. Pt resting with call bell within reach.  Will continue to monitor.

## 2017-06-16 NOTE — ED Notes (Signed)
Rec'd call from [place], 4E, to give report on patient prior to his arrival to the floor. Pt to go to rm 4E22.

## 2017-06-16 NOTE — Progress Notes (Signed)
PROGRESS NOTE    Johnny Morrow  BWG:[id number] DOB: 12/12/1919 DOA: 06/15/2017 PCP: Wenda Low, MD   Specialists:     Brief Narrative:   83 year old male Known history of a flutter--some intolerance to beta blockade secondary to bradycardia Stage IV CKD secondary to chronic obstructive uropathy versus hypertensive nephrosclerosis--baseline creatinine 4.0 as per chart 05/14/2017 Prostate cancer status post XRT 2001 and prostatectomy 1993 Part showed a left ureteric obstruction--offered retrograde pyelogram which patient deferred at urologist office Nonocclusive thrombus in left popliteal vein Moderate aortic stenosis Heart failure Severe retroperitoneal bleed setting of anticoagulation requiring multiple blood transfusions  Most recent admission 8 6-8/05/2017 with decompensated CHF--baseline weight 115 pounds  Admitted on 06/07/2017 with loss of balance without presyncope, radiograph showed comminuted right hip fracture-Dr. Doran Durand saw the patient and surgery was performed on 06/07/2017   Assessment & Plan:   Principal Problem:   Tachycardia Active Problems:   Essential hypertension, benign   History of DVT of lower extremity, left   Moderate tricuspid regurgitation   Mild aortic stenosis   Chronic kidney disease (CKD), stage IV (severe) ([facility])   AF (paroxysmal atrial fibrillation) (HCC)   Closed intertrochanteric fracture of hip, right, initial encounter ([place]) 10 /18   Retroperitoneal bleed 5/18   Chronic diastolic CHF (congestive heart failure) (H[facility])   Sinus tachycardia/atrial fibrillation, CHad2Vasc2 score=5 Sinus tach on admission Bradycardia on beta-blocker Not on anticoagulation secondary to retroperitoneal bleed 5/18  Secondary to volume depletion  VQ of the chest is negative for acute embolus and I believe that with volume repletion he will eventually get somewhat  better  When I last discharged him from hospital he was rate controlled without any  meds-historically bradycardic on  beta-blocker  Trial metoprolol XL 25   discontinued his amlodipine 5 mg, ? Imdur 60-->30 mg  Agree with Dr. Shanon Brow regarding Xopenex versus albuterol at to facilitate rate control  Favor not trialing high risk meds like digoxin given poor renal function  Positive troponin  Likely rate related tachycardia  Trending troponin--- trend is relatively flat and peaked at 0.15 and is now down to 0.12.  At this juncture would not involve cardiology but would monitor and manage  Volume depletion in setting of chronic kidney disease stage IV-V  Difficult situation--has aortic stenosis so is volume dependent and difficult to diuresis as per last hospital stay  Would give IV fluid 50 cc/h and monitor  Repeat labs in a.m.  Anemia of renal disease  Hemoglobin in the 8 range  Historically has been at this level and would not transfuse at this juncture  Recent hip repair  Status post hip repair by Dr. Doran Durand 10/21 right IM nail-needs hip precautions as per orthopedics and will have therapy  Reevaluate once he returns to skilled facility if his heart rate   Chronic diastolic heart failure  Last echo 65-70% 01/11/2016  Was not discharged on diuretics on last discharge and I have discontinued Lasix 40 here-- he needs some volume  We will need I's and O's as well as weight  Would not recommend daily Lasix on discharge--individualized every other day or every 3 or 4 days based on weight  and fluid status  Consultants:   none  Procedures:   None yet  Antimicrobials:   none    Subjective:  Awake alert no distress seems a little better--reviewed later when arrived to floor and no overt issues eatign drinking   Objective: Vitals:   06/16/17 0615 06/16/17 0630 06/16/17 0645 06/16/17 0700  BP: 122/79 131/83 117/85 (!) 125/95  Pulse:  (!) 124  (!) 122  Resp:  13    Temp:      TempSrc:      SpO2: 98% 96% 98% 95%  Weight:      Height:        Intake/Output  Summary (Last 24 hours) at 06/16/17 0839 Last data filed at 06/16/17 0508  Gross per 24 hour  Intake              250 ml  Output               90 ml  Net              160 ml   Filed Weights   06/15/17 1709  Weight: 56.7 kg (125 lb)    Examination:  eomi scleral icterus Mild pallor Neck soft supple no lan abd soft no reboudn no guard Neuro intact  Data Reviewed: I have personally reviewed following labs and imaging studies  CBC:  Recent Labs Lab 06/10/17 0247 06/11/17 0434 06/12/17 0308 06/15/17 06/15/17 1730 06/16/17 0435  WBC 5.8 4.9 4.9 8.7 8.1 5.4  NEUTROABS  --  3.0  --   --  5.9  --   HGB 7.1* 7.9* 7.9* 7.8* 7.0* 8.0*  HCT 20.3* 22.3* 22.3* 24* 20.4* 23.1*  MCV 81.9 80.8 81.4  --  82.3 81.9  PLT 91* 89* 100* 141* 181 010   Basic Metabolic Panel:  Recent Labs Lab 06/10/17 0247 06/11/17 0434 06/12/17 0308 06/15/17 06/15/17 1730 06/16/17 0435  NA 135 137 136 139 134* 135  K 4.1 3.6 3.6 4.2 3.9 3.7  CL 107 108 108  --  105 108  CO2 17* 17* 18*  --  17* 17*  GLUCOSE 115* 104* 124*  --  118* 127*  BUN 84* 88* 91* 114* 123* 119*  CREATININE 4.95* 5.15* 4.86* 4.6* 4.56* 4.39*  CALCIUM 8.0* 8.2* 8.4*  --  8.6* 8.4*  PHOS  --   --  3.6  --   --   --    GFR: Estimated Creatinine Clearance: 8.3 mL/min (A) (by C-G formula based on SCr of 4.39 mg/dL (H)). Liver Function Tests:  Recent Labs Lab 06/12/17 0308  ALBUMIN 2.4*   No results for input(s): LIPASE, AMYLASE in the last 168 hours. No results for input(s): AMMONIA in the last 168 hours. Coagulation Profile: No results for input(s): INR, PROTIME in the last 168 hours. Cardiac Enzymes:  Recent Labs Lab 06/15/17 1730 06/16/17 0435  TROPONINI 0.06* 0.15*   BNP (last 3 results)  Recent Labs  08/20/16 1221 12/22/16 1453  PROBNP 373.0* 999.0*   HbA1C: No results for input(s): HGBA1C in the last 72 hours. CBG: No results for input(s): GLUCAP in the last 168 hours. Lipid Profile: No results  for input(s): CHOL, HDL, LDLCALC, TRIG, CHOLHDL, LDLDIRECT in the last 72 hours. Thyroid Function Tests:  Recent Labs  06/16/17 0435  TSH 4.056   Anemia Panel: No results for input(s): VITAMINB12, FOLATE, FERRITIN, TIBC, IRON, RETICCTPCT in the last 72 hours. Urine analysis:    Component Value Date/Time   COLORURINE YELLOW 06/07/2017 [place] 06/07/2017 1313   LABSPEC 1.013 06/07/2017 1313   PHURINE 5.0 06/07/2017 1313   GLUCOSEU NEGATIVE 06/07/2017 1313   HGBUR NEGATIVE 06/07/2017 1313   BILIRUBINUR NEGATIVE 06/07/2017 1313   KETONESUR NEGATIVE 06/07/2017 1313   PROTEINUR 100 (A) 06/07/2017 1313   NITRITE NEGATIVE 06/07/2017 1313  LEUKOCYTESUR NEGATIVE 06/07/2017 1313     Radiology Studies: Reviewed images personally in health database    Scheduled Meds: . acidophilus  1 capsule Oral Daily  . amLODipine  10 mg Oral Daily  . aspirin EC  81 mg Oral BID  . cholecalciferol  5,000 Units Oral Daily  . doxazosin  1 mg Oral QPM  . ferrous sulfate  325 mg Oral Q breakfast  . furosemide  40 mg Oral Daily  . hydrALAZINE  50 mg Oral TID  . isosorbide mononitrate  60 mg Oral Daily  . omega-3 acid ethyl esters  2,000 mg Oral Daily  . polyethylene glycol  17 g Oral Daily  . sodium chloride flush  3 mL Intravenous Q12H   Continuous Infusions: . sodium chloride       LOS: 0 days    Time spent: [place], MD [facility] ([phone]   If 7PM-7AM, please contact night-coverage [email or web address] Password [email or web address] 06/16/2017, 8:39 AM

## 2017-06-16 NOTE — ED Notes (Signed)
Rec'd report from [place], [place]. Upon arrival, patient awake and alert, resting in bed comfortably without distress; c/o pain 7/10 in BLE. Pt rec'd pain meds recently; assisted patient with repositioning; patient up eating bkfst at this time.

## 2017-06-16 NOTE — Progress Notes (Signed)

## 2017-06-17 ENCOUNTER — Observation Stay (HOSPITAL_COMMUNITY): Payer: Medicare Other

## 2017-06-17 DIAGNOSIS — Z87891 Personal history of nicotine dependence: Secondary | ICD-10-CM | POA: Diagnosis not present

## 2017-06-17 DIAGNOSIS — R000 Tachycardia, unspecified: Secondary | ICD-10-CM | POA: Diagnosis not present

## 2017-06-17 DIAGNOSIS — I495 Sick sinus syndrome: Secondary | ICD-10-CM | POA: Diagnosis present

## 2017-06-17 DIAGNOSIS — Z8674 Personal history of sudden cardiac arrest: Secondary | ICD-10-CM | POA: Diagnosis not present

## 2017-06-17 DIAGNOSIS — I5032 Chronic diastolic (congestive) heart failure: Secondary | ICD-10-CM | POA: Diagnosis not present

## 2017-06-17 DIAGNOSIS — I480 Paroxysmal atrial fibrillation: Secondary | ICD-10-CM

## 2017-06-17 DIAGNOSIS — K5900 Constipation, unspecified: Secondary | ICD-10-CM | POA: Diagnosis not present

## 2017-06-17 DIAGNOSIS — I082 Rheumatic disorders of both aortic and tricuspid valves: Secondary | ICD-10-CM | POA: Diagnosis not present

## 2017-06-17 DIAGNOSIS — I350 Nonrheumatic aortic (valve) stenosis: Secondary | ICD-10-CM | POA: Diagnosis not present

## 2017-06-17 DIAGNOSIS — R748 Abnormal levels of other serum enzymes: Secondary | ICD-10-CM | POA: Diagnosis not present

## 2017-06-17 DIAGNOSIS — Z8679 Personal history of other diseases of the circulatory system: Secondary | ICD-10-CM | POA: Diagnosis not present

## 2017-06-17 DIAGNOSIS — R58 Hemorrhage, not elsewhere classified: Secondary | ICD-10-CM | POA: Diagnosis not present

## 2017-06-17 DIAGNOSIS — Z880 Allergy status to penicillin: Secondary | ICD-10-CM | POA: Diagnosis not present

## 2017-06-17 DIAGNOSIS — E860 Dehydration: Secondary | ICD-10-CM | POA: Diagnosis not present

## 2017-06-17 DIAGNOSIS — Z923 Personal history of irradiation: Secondary | ICD-10-CM | POA: Diagnosis not present

## 2017-06-17 DIAGNOSIS — I248 Other forms of acute ischemic heart disease: Secondary | ICD-10-CM | POA: Diagnosis present

## 2017-06-17 DIAGNOSIS — D649 Anemia, unspecified: Secondary | ICD-10-CM | POA: Diagnosis not present

## 2017-06-17 DIAGNOSIS — Z86718 Personal history of other venous thrombosis and embolism: Secondary | ICD-10-CM | POA: Diagnosis not present

## 2017-06-17 DIAGNOSIS — R531 Weakness: Secondary | ICD-10-CM | POA: Diagnosis not present

## 2017-06-17 DIAGNOSIS — Z809 Family history of malignant neoplasm, unspecified: Secondary | ICD-10-CM | POA: Diagnosis not present

## 2017-06-17 DIAGNOSIS — R001 Bradycardia, unspecified: Secondary | ICD-10-CM | POA: Diagnosis not present

## 2017-06-17 DIAGNOSIS — I4892 Unspecified atrial flutter: Secondary | ICD-10-CM | POA: Diagnosis not present

## 2017-06-17 DIAGNOSIS — Z79899 Other long term (current) drug therapy: Secondary | ICD-10-CM | POA: Diagnosis not present

## 2017-06-17 DIAGNOSIS — Z8546 Personal history of malignant neoplasm of prostate: Secondary | ICD-10-CM | POA: Diagnosis not present

## 2017-06-17 DIAGNOSIS — D631 Anemia in chronic kidney disease: Secondary | ICD-10-CM | POA: Diagnosis not present

## 2017-06-17 DIAGNOSIS — Z9079 Acquired absence of other genital organ(s): Secondary | ICD-10-CM | POA: Diagnosis not present

## 2017-06-17 DIAGNOSIS — I484 Atypical atrial flutter: Secondary | ICD-10-CM | POA: Diagnosis not present

## 2017-06-17 DIAGNOSIS — I130 Hypertensive heart and chronic kidney disease with heart failure and stage 1 through stage 4 chronic kidney disease, or unspecified chronic kidney disease: Secondary | ICD-10-CM | POA: Diagnosis present

## 2017-06-17 DIAGNOSIS — Z7982 Long term (current) use of aspirin: Secondary | ICD-10-CM | POA: Diagnosis not present

## 2017-06-17 DIAGNOSIS — I10 Essential (primary) hypertension: Secondary | ICD-10-CM | POA: Diagnosis not present

## 2017-06-17 DIAGNOSIS — N184 Chronic kidney disease, stage 4 (severe): Secondary | ICD-10-CM | POA: Diagnosis not present

## 2017-06-17 DIAGNOSIS — Z8249 Family history of ischemic heart disease and other diseases of the circulatory system: Secondary | ICD-10-CM | POA: Diagnosis not present

## 2017-06-17 DIAGNOSIS — Z91041 Radiographic dye allergy status: Secondary | ICD-10-CM | POA: Diagnosis not present

## 2017-06-17 LAB — CBC WITH DIFFERENTIAL/PLATELET
Basophils Absolute: 0 10*3/uL (ref 0.0–0.1)
Basophils Relative: 0 %
Eosinophils Absolute: 0.2 10*3/uL (ref 0.0–0.7)
Eosinophils Relative: 4 %
HEMATOCRIT: 23.1 % — AB (ref 39.0–52.0)
HEMOGLOBIN: 8 g/dL — AB (ref 13.0–17.0)
LYMPHS ABS: 1.2 10*3/uL (ref 0.7–4.0)
LYMPHS PCT: 25 %
MCH: 28.6 pg (ref 26.0–34.0)
MCHC: 34.6 g/dL (ref 30.0–36.0)
MCV: 82.5 fL (ref 78.0–100.0)
Monocytes Absolute: 0.4 10*3/uL (ref 0.1–1.0)
Monocytes Relative: 9 %
NEUTROS PCT: 62 %
Neutro Abs: 3 10*3/uL (ref 1.7–7.7)
Platelets: 173 10*3/uL (ref 150–400)
RBC: 2.8 MIL/uL — AB (ref 4.22–5.81)
RDW: 13.9 % (ref 11.5–15.5)
WBC: 4.8 10*3/uL (ref 4.0–10.5)

## 2017-06-17 LAB — BASIC METABOLIC PANEL
Anion gap: 9 (ref 5–15)
BUN: 111 mg/dL — AB (ref 6–20)
CHLORIDE: 107 mmol/L (ref 101–111)
CO2: 18 mmol/L — ABNORMAL LOW (ref 22–32)
Calcium: 8.3 mg/dL — ABNORMAL LOW (ref 8.9–10.3)
Creatinine, Ser: 4.27 mg/dL — ABNORMAL HIGH (ref 0.61–1.24)
GFR calc Af Amer: 12 mL/min — ABNORMAL LOW (ref >60)
GFR calc non Af Amer: 11 mL/min — ABNORMAL LOW (ref >60)
GLUCOSE: 101 mg/dL — AB (ref 65–99)
POTASSIUM: 3.9 mmol/L (ref 3.5–5.1)
Sodium: 134 mmol/L — ABNORMAL LOW (ref 135–145)

## 2017-06-17 NOTE — Progress Notes (Signed)
Central Tele called regarding patient status - A Fib;  Atrial flutter confirmed with a 12 lead EKG. Will continue to monitor patient.

## 2017-06-17 NOTE — Progress Notes (Signed)
PROGRESS NOTE  Johnny Morrow BDZ:[id number] DOB: 1919/10/30 DOA: 06/15/2017 PCP: Wenda Low, MD   LOS: 0 days   Brief Narrative / Interim history: 83 year old male with history of a flutter with intolerance to beta-blockers due to bradycardia, stage IV chronic kidney disease with a baseline creatinine of 4.0, prostate cancer status post prostatectomy and radiation therapy in 1993 chronic nonocclusive thrombus in the left popliteal vein, moderate aortic stenosis, severe retroperitoneal bleed in the setting of anticoagulation, chronic diastolic CHF, recent hip repair in October 2018, who was admitted to the hospital  from SNF with elevated heart rates into the 140s.  Assessment & Plan: Principal Problem:   Tachycardia Active Problems:   Essential hypertension, benign   History of DVT of lower extremity, left   Moderate tricuspid regurgitation   Mild aortic stenosis   Chronic kidney disease (CKD), stage IV (severe) (HCC)   AF (paroxysmal atrial fibrillation) (HCC)   Closed intertrochanteric fracture of hip, right, initial encounter (HCC) 10 /18   Retroperitoneal bleed 5/18   Chronic diastolic CHF (congestive heart failure) (HCC)  Sinus tachycardia/a flutter -Not on anticoagulation due to RP bleed in May 2019 -Has a history of bradycardia on beta-blockers, patient was started on metoprolol yesterday, no improvement in his heart rate, consulted cardiology this morning, appreciate input  Troponin elevation -No chest pain, likely demand ischemia  Chronic kidney disease stage IV -Patient with baseline creatinine around 4.5, creatinine 4.8 on admission, 4.2 this morning.  Felt to be slightly dehydrated and has been placed on IV fluids since yesterday, discontinue fluids today, he appears euvolemic and he is eating and drinking well  Anemia of renal disease -Hemoglobin 8 this morning, stable since yesterday.  Recent hip repair -Status post repair by Dr. Doran Durand 10/21 right IM nail-needs  hip precautions as per orthopedics  Chronic diastolic CHF -Last echo 34-19% in May 2017.  2D echo pending -Stop fluids today   DVT prophylaxis: SCDs Code Status: Full code Family Communication: d/w daughter over the phone (she is MD) Disposition Plan: SNF when ready   Consultants:   Cardiology   Procedures:   2D echo: pending  Antimicrobials:  None    Subjective: - no chest pain, shortness of breath, no abdominal pain, nausea or vomiting.   Objective: Vitals:   06/17/17 0308 06/17/17 0831 06/17/17 1200 06/17/17 1202  BP: 110/80 111/80 (!) 129/94 (!) 129/94  Pulse: (!) 117 (!) 119 (!) 121 (!) 117  Resp: (!) 25 (!) 21 20 (!) 21  Temp: (!) 97.4 F (36.3 C) 98.4 F (36.9 C)  97.9 F (36.6 C)  TempSrc: Oral Oral  Oral  SpO2: 96% 96% 97% 98%  Weight:      Height:        Intake/Output Summary (Last 24 hours) at 06/17/17 1314 Last data filed at 06/17/17 1242  Gross per 24 hour  Intake             1873 ml  Output              525 ml  Net             1348 ml   Filed Weights   06/15/17 1709  Weight: 56.7 kg (125 lb)    Examination:  Constitutional: NAD Eyes: PERRL, lids and conjunctivae normal Respiratory: clear to auscultation bilaterally, no wheezing, no crackles.  Cardiovascular: Regular. Tachycardic Abdomen: no tenderness. Bowel sounds positive.  Musculoskeletal: no clubbing / cyanosis Skin: no rashes, lesions, ulcers. No induration Neurologic: CN  2-12 grossly intact. Strength 5/5 in all 4.  Psychiatric: Normal judgment and insight. Alert and oriented x 3. Normal mood.    Data Reviewed: I have independently reviewed following labs and imaging studies   CBC:  Recent Labs Lab 06/11/17 0434 06/12/17 0308 06/15/17 06/15/17 1730 06/16/17 0435 06/17/17 0230  WBC 4.9 4.9 8.7 8.1 5.4 4.8  NEUTROABS 3.0  --   --  5.9  --  3.0  HGB 7.9* 7.9* 7.8* 7.0* 8.0* 8.0*  HCT 22.3* 22.3* 24* 20.4* 23.1* 23.1*  MCV 80.8 81.4  --  82.3 81.9 82.5  PLT 89* 100*  141* 181 163 161   Basic Metabolic Panel:  Recent Labs Lab 06/11/17 0434 06/12/17 0308 06/15/17 06/15/17 1730 06/16/17 0435 06/17/17 0230  NA 137 136 139 134* 135 134*  K 3.6 3.6 4.2 3.9 3.7 3.9  CL 108 108  --  105 108 107  CO2 17* 18*  --  17* 17* 18*  GLUCOSE 104* 124*  --  118* 127* 101*  BUN 88* 91* 114* 123* 119* 111*  CREATININE 5.15* 4.86* 4.6* 4.56* 4.39* 4.27*  CALCIUM 8.2* 8.4*  --  8.6* 8.4* 8.3*  PHOS  --  3.6  --   --   --   --    GFR: Estimated Creatinine Clearance: 8.5 mL/min (A) (by C-G formula based on SCr of 4.27 mg/dL (H)). Liver Function Tests:  Recent Labs Lab 06/12/17 0308  ALBUMIN 2.4*   No results for input(s): LIPASE, AMYLASE in the last 168 hours. No results for input(s): AMMONIA in the last 168 hours. Coagulation Profile: No results for input(s): INR, PROTIME in the last 168 hours. Cardiac Enzymes:  Recent Labs Lab 06/15/17 1730 06/16/17 0435 06/16/17 1211  TROPONINI 0.06* 0.15* 0.12*   BNP (last 3 results)  Recent Labs  08/20/16 1221 12/22/16 1453  PROBNP 373.0* 999.0*   HbA1C: No results for input(s): HGBA1C in the last 72 hours. CBG: No results for input(s): GLUCAP in the last 168 hours. Lipid Profile: No results for input(s): CHOL, HDL, LDLCALC, TRIG, CHOLHDL, LDLDIRECT in the last 72 hours. Thyroid Function Tests:  Recent Labs  06/16/17 0435  TSH 4.056   Anemia Panel: No results for input(s): VITAMINB12, FOLATE, FERRITIN, TIBC, IRON, RETICCTPCT in the last 72 hours. Urine analysis:    Component Value Date/Time   COLORURINE YELLOW 06/07/2017 [place] 06/07/2017 1313   LABSPEC 1.013 06/07/2017 1313   PHURINE 5.0 06/07/2017 1313   GLUCOSEU NEGATIVE 06/07/2017 1313   HGBUR NEGATIVE 06/07/2017 1313   BILIRUBINUR NEGATIVE 06/07/2017 1313   KETONESUR NEGATIVE 06/07/2017 1313   PROTEINUR 100 (A) 06/07/2017 1313   NITRITE NEGATIVE 06/07/2017 1313   LEUKOCYTESUR NEGATIVE 06/07/2017 1313   Sepsis  Labs: Invalid input(s): PROCALCITONIN, LACTICIDVEN  Recent Results (from the past 240 hour(s))  MRSA PCR Screening     Status: None   Collection Time: 06/16/17  1:38 PM  Result Value Ref Range Status   MRSA by PCR NEGATIVE NEGATIVE Final    Comment:        The GeneXpert MRSA Assay (FDA approved for NASAL specimens only), is one component of a comprehensive MRSA colonization surveillance program. It is not intended to diagnose MRSA infection nor to guide or monitor treatment for MRSA infections.     Radiology Studies: Nm Pulmonary Vent And Perf (v/q Scan)  Result Date: 06/15/2017 CLINICAL DATA:  Tachycardia and weakness. Recent hip surgery. PE suspected, high pretest prob EXAM: NUCLEAR MEDICINE  VENTILATION - PERFUSION LUNG SCAN TECHNIQUE: Ventilation images were obtained in multiple projections using inhaled aerosol Tc-77m DTPA. Perfusion images were obtained in multiple projections after intravenous injection of Tc-59m MAA. RADIOPHARMACEUTICALS:  31.6 mCi Technetium-91m DTPA aerosol inhalation and 4.40 mCi Technetium-78m MAA IV COMPARISON:  Chest radiograph earlier this day FINDINGS: Ventilation: Diffusely heterogeneous, no focal ventilation defect. Perfusion: No wedge shaped peripheral perfusion defects to suggest acute pulmonary embolism. Enlarged cardiac silhouette is noted. Perfusion more homogeneous on ventilation. IMPRESSION: 1. Low probability for pulmonary embolus. 2. Profusion is more homogeneous than ventilation, which can be seen with airways disease. Electronically Signed   By: Jeb Levering M.D.   On: 06/15/2017 23:22   Dg Chest Portable 1 View  Result Date: 06/15/2017 CLINICAL DATA:  Tachycardia EXAM: PORTABLE CHEST 1 VIEW COMPARISON:  06/07/2017 FINDINGS: Prior interstitial prominence has improved/resolved, suggesting resolution of interstitial edema. Mild patchy bilateral lower lobe opacities, likely atelectasis. No definite pleural effusions. No pneumothorax.  Cardiomegaly. Midthoracic dextroscoliosis. IMPRESSION: Prior interstitial edema has resolved. Mild patchy bilateral lower lobe opacities, likely atelectasis. Electronically Signed   By: Julian Hy M.D.   On: 06/15/2017 18:06   Scheduled Meds: . acidophilus  1 capsule Oral Daily  . aspirin EC  81 mg Oral BID  . cholecalciferol  5,000 Units Oral Daily  . doxazosin  1 mg Oral QPM  . ferrous sulfate  325 mg Oral Q breakfast  . isosorbide mononitrate  30 mg Oral Daily  . metoprolol succinate  25 mg Oral Daily  . polyethylene glycol  17 g Oral Daily  . sodium chloride flush  3 mL Intravenous Q12H   Continuous Infusions: . sodium chloride    . sodium chloride Stopped (06/17/17 1100)    Marzetta Board, MD, PhD [facility] Pager [phone]  If 7PM-7AM, please contact night-coverage [email or web address] Password [email or web address] 06/17/2017, 1:14 PM

## 2017-06-17 NOTE — Consult Note (Signed)
Cardiology Consultation:   Patient ID: Johnny Morrow; [id number]; [birth year]   Admit date: 06/15/2017 Date of Consult: 06/17/2017  Primary Care Provider: Wenda Low, MD Primary Cardiologist: Dr. Harrington Challenger    Patient Profile:   Johnny Morrow is a 83 y.o. male with a hx of AFib, Aflutter, HTN, chronic CHF (diastolic), CRI (IV), DVT who is being seen today for the evaluation of AFlutter, rate control opptions at the request of Dr. Percival Spanish.  History of Present Illness:   Johnny Morrow was recently discharged from here after a fall/hip fracture and underwent R hip surgery 06/07/17, during that stay he was seen by cardiology service for clearance.  At that time was noted,   "pt is followed by [facility]. Per Care Everywhere, he has h/o atrial flutter. He was admitted to [facility] in April of this year for atrial flutter w/ RVR. He was started on labetalol which led to significant bradycardia. Carvedilol was also tried. This also caused significant bradycardia. He was also initiated on heparin and developed a significant RP bleed. During this admission he developed a significant aspiration event which led to a PEA arrest. He was successfully resuscitated and require multiple blood transfusions that admission.  Additional cardiac history includes mild-moderate AS and HFpEF. Most recent echo 03/24/17 showed normal LVEF at 55%, tri leaflet AV with a mean gradient of 21 mmHg. There is no documented h/o CAD."  He was in SR during that stay, he was felt to be relatively well compensated given his chronic cardiacand renal conditions and likely no particular room for any optimization, recommended to proceed with surgery, which he had done, ultimately discharged to ALF/SNF on 06/12/17.  He was referred back to the hospital after a couple days of staff noting his HR in the 120's, I do not see any report of any particular symptoms.  The patient today denies any cardiac awareness, no palpitations, no CP or SOB.  Says  he feels "just fine" outside of ongoing hip discomfort.   LABS: K+ 3.9 BUN/Creat 111/4.27 WBC 4.8 H/H 8/23 plts 173 Trop I: 0.06, 0.15, 0.12  Past Medical History:  Diagnosis Date  . Anemia   . Arthritis   . Atrial fibrillation ([place])   . Cataract    "ready to come out on both sides; anytime" (06/16/2017)  . CHF (congestive heart failure) ([place])   . CKD (chronic kidney disease), stage III ([place])    "followed by Dr. Keturah Barre @ [place]; CKD fluctuates between III and IV" (06/16/2017)  . DVT (deep venous thrombosis) (HCC)    LLE  . Heart murmur   . History of blood transfusion ~ 01/2017; 05/2017   "blood loss; anemia"  . Hypertension   . Pneumonia 12/2016   "a touch"  . Prostate cancer [facility])     follows with hematologists q 6 months. S/p radical prostatectomy and proton therapy in 2001.  . Radiation cystitis   . Retinal hemorrhage of both eyes    follows with ophthalmologists; "never had OR" (06/16/2017)  . Retroperitoneal bleed ?12/2016   [name] 06/15/2017    Past Surgical History:  Procedure Laterality Date  . FRACTURE SURGERY    . INGUINAL HERNIA REPAIR    . INTRAMEDULLARY (IM) NAIL INTERTROCHANTERIC Right 06/07/2017   Procedure: INTRAMEDULLARY (IM) NAIL INTERTROCHANTRIC;  Surgeon: [name], MD;  Location: [place];  Service: Orthopedics;  Laterality: Right;  . PROSTATECTOMY    . TIBIA FRACTURE SURGERY Left 06/2012  . TONSILLECTOMY  Inpatient Medications: Scheduled Meds: . acidophilus  1 capsule Oral Daily  . aspirin EC  81 mg Oral BID  . cholecalciferol  5,000 Units Oral Daily  . doxazosin  1 mg Oral QPM  . ferrous sulfate  325 mg Oral Q breakfast  . isosorbide mononitrate  30 mg Oral Daily  . metoprolol succinate  25 mg Oral Daily  . polyethylene glycol  17 g Oral Daily  . sodium chloride flush  3 mL Intravenous Q12H   Continuous Infusions: . sodium chloride     PRN Meds: sodium chloride, bisacodyl, levalbuterol, sodium chloride flush,  traMADol  Allergies:    Allergies  Allergen Reactions  . Iodinated Diagnostic Agents Rash  . Ace Inhibitors Other (See Comments)    Noted (on MAR) to be "allergic," but no reaction was cited/noted  . Shellfish-Derived Products Other (See Comments)    Noted (on MAR) to be "allergic," but no reaction was cited/noted  . Penicillins Rash    Has patient had a PCN reaction causing immediate rash, facial/tongue/throat swelling, SOB or lightheadedness with hypotension: Yes Has patient had a PCN reaction causing severe rash involving mucus membranes or skin necrosis: Unknown Has patient had a PCN reaction that required hospitalization: Unknown Has patient had a PCN reaction occurring within the last 10 years: Unknown If all of the above answers are "NO", then may proceed with Cephalosporin use.     Social History:   Social History   Social History  . Marital status: Married    Spouse name: N/A  . Number of children: N/A  . Years of education: N/A   Occupational History  . Not on file.   Social History Main Topics  . Smoking status: Former Smoker    Years: 10.00    Types: Cigarettes    Quit date: 20  . Smokeless tobacco: Never Used  . Alcohol use 0.6 oz/week    1 Glasses of wine per week  . Drug use: No  . Sexual activity: No   Other Topics Concern  . Not on file   Social History Narrative  . No narrative on file    Family History:    Family History  Problem Relation Age of Onset  . COPD Brother   . Heart attack Brother   . Cancer Father   . Heart attack Father   . Diabetes Mother      ROS:  Please see the history of present illness.  ROS  All other ROS reviewed and negative.     Physical Exam/Data:   Vitals:   06/17/17 0308 06/17/17 0831 06/17/17 1200 06/17/17 1202  BP: 110/80 111/80 (!) 129/94 (!) 129/94  Pulse: (!) 117 (!) 119 (!) 121 (!) 117  Resp: (!) 25 (!) 21 20 (!) 21  Temp: (!) 97.4 F (36.3 C) 98.4 F (36.9 C)  97.9 F (36.6 C)  TempSrc:  Oral Oral  Oral  SpO2: 96% 96% 97% 98%  Weight:      Height:        Intake/Output Summary (Last 24 hours) at 06/17/17 1555 Last data filed at 06/17/17 1242  Gross per 24 hour  Intake             1633 ml  Output              325 ml  Net             1308 ml   Filed Weights   06/15/17 1709  Weight: 125 lb (  56.7 kg)   Body mass index is 21.46 kg/m.  General:  Well nourished, well developed, elderly BM, in no acute distress, eating dinner without difficulty HEENT: normal Lymph: no adenopathy Neck: no JVD Endocrine:  No thryomegaly Vascular: No carotid bruits  Cardiac: regular, tachycardic; 1/6 SM, no gallops or rubs appreciated Lungs:  CTA b/l, no wheezing, rhonchi or rales  Abd: soft, nontender Ext: no edema Musculoskeletal:  No deformities, age appropriate atrophy Skin: warm and dry  Neuro:  No gross focal abnormalities noted Psych:  Normal affect   EKG:  The EKG was personally reviewed and demonstrates:   2:1 AFlutter, V rate 121bpm Telemetry:  Telemetry was personally reviewed and demonstrates:   AFlutter generally maintaining 120's, rarely has slowed, clearly AFlutter  Relevant CV Studies:  echo 03/24/17 showed normal LVEF at 55%, tri leaflet AV with a mean gradient of 21 mmHg 2D Echo 03/2017 LV Ejection Fraction (%) 55 %  Right Ventricle Systolic Pressure (mmHg) 71 mmHg  Left Atrium Diameter (cm) 4.4 cm  LV End Diastolic Diameter (cm) 4.1 cm  LV End Systolic Diameter (cm) 3.0 cm  LV Septum Wall Thickness (cm) 1.5 cm  LV Posterior Wall Thickness (cm) 1.5 cm  Tricuspid Valve Regurgitation Grade moderate   Tricuspid Valve Regurgitation Max Velocity (m/s) 4.0 m/s  Mitral Valve Regurgitation Grade mild   Mitral Valve Stenosis Grade none   Aortic Valve Regurgitation Grade mild   Aortic Valve Stenosis Grade moderate   Aortic Valve Stenosis Mean Gradient (mmHg) 21 mmHg  Aortic Valve Max Velocity (m/s) 3.2 m/s     01/11/16: TTE Study Conclusions - Left  ventricle: The cavity size was normal. There was moderate   concentric hypertrophy. Systolic function was vigorous. The   estimated ejection fraction was in the range of 65% to 70%. Wall   motion was normal; there were no regional wall motion   abnormalities. Doppler parameters are consistent with abnormal   left ventricular relaxation (grade 1 diastolic dysfunction).   Doppler parameters are consistent with elevated ventricular   end-diastolic filling pressure. - Aortic valve: Valve mobility was restricted. There was mild   stenosis. There was mild regurgitation. Mean gradient (S): 15 mm   Hg. Valve area (VTI): 1.66 cm^2. Valve area (Vmax): 1.4 cm^2.   Valve area (Vmean): 1.37 cm^2. - Aortic root: The aortic root was normal in size. - Ascending aorta: The ascending aorta was normal in size. - Mitral valve: Calcified annulus. There was mild regurgitation. - Left atrium: The atrium was mildly dilated. - Right ventricle: The cavity size was normal. Wall thickness was   normal. Systolic function was normal. - Right atrium: The atrium was normal in size. - Tricuspid valve: There was mild regurgitation. - Pulmonic valve: There was mild regurgitation. - Pulmonary arteries: The main pulmonary artery was normal-sized.   Systolic pressure was at the upper limits of normal. PA peak   pressure: 35 mm Hg (S). - Inferior vena cava: The vessel was normal in size. The   respirophasic diameter changes were in the normal range (= 50%),   consistent with normal central venous pressure. - Pericardium, extracardiac: There was no pericardial effusion.   Laboratory Data:  Chemistry Recent Labs Lab 06/15/17 1730 06/16/17 0435 06/17/17 0230  NA 134* 135 134*  K 3.9 3.7 3.9  CL 105 108 107  CO2 17* 17* 18*  GLUCOSE 118* 127* 101*  BUN 123* 119* 111*  CREATININE 4.56* 4.39* 4.27*  CALCIUM 8.6* 8.4* 8.3*  GFRNONAA  10* 10* 11*  GFRAA 12* 12* 12*  ANIONGAP 12 10 9      Recent Labs Lab  06/12/17 0308  ALBUMIN 2.4*   Hematology Recent Labs Lab 06/15/17 1730 06/16/17 0435 06/17/17 0230  WBC 8.1 5.4 4.8  RBC 2.48* 2.82* 2.80*  HGB 7.0* 8.0* 8.0*  HCT 20.4* 23.1* 23.1*  MCV 82.3 81.9 82.5  MCH 28.2 28.4 28.6  MCHC 34.3 34.6 34.6  RDW 14.3 14.0 13.9  PLT 181 163 173   Cardiac Enzymes Recent Labs Lab 06/15/17 1730 06/16/17 0435 06/16/17 1211  TROPONINI 0.06* 0.15* 0.12*   No results for input(s): TROPIPOC in the last 168 hours.  BNPNo results for input(s): BNP, PROBNP in the last 168 hours.  DDimer No results for input(s): DDIMER in the last 168 hours.  Radiology/Studies:   Nm Pulmonary Vent And Perf (v/q Scan) Result Date: 06/15/2017 CLINICAL DATA:  Tachycardia and weakness. Recent hip surgery. PE suspected, high pretest prob EXAM: NUCLEAR MEDICINE VENTILATION - PERFUSION LUNG SCAN TECHNIQUE: Ventilation images were obtained in multiple projections using inhaled aerosol Tc-38m DTPA. Perfusion images were obtained in multiple projections after intravenous injection of Tc-3m MAA. RADIOPHARMACEUTICALS:  31.6 mCi Technetium-23m DTPA aerosol inhalation and 4.40 mCi Technetium-21m MAA IV COMPARISON:  Chest radiograph earlier this day FINDINGS: Ventilation: Diffusely heterogeneous, no focal ventilation defect. Perfusion: No wedge shaped peripheral perfusion defects to suggest acute pulmonary embolism. Enlarged cardiac silhouette is noted. Perfusion more homogeneous on ventilation. IMPRESSION: 1. Low probability for pulmonary embolus. 2. Profusion is more homogeneous than ventilation, which can be seen with airways disease. Electronically Signed   By: Jeb Levering M.D.   On: 06/15/2017 23:22   Dg Chest Portable 1 View Result Date: 06/15/2017 CLINICAL DATA:  Tachycardia EXAM: PORTABLE CHEST 1 VIEW COMPARISON:  06/07/2017 FINDINGS: Prior interstitial prominence has improved/resolved, suggesting resolution of interstitial edema. Mild patchy bilateral lower lobe  opacities, likely atelectasis. No definite pleural effusions. No pneumothorax. Cardiomegaly. Midthoracic dextroscoliosis. IMPRESSION: Prior interstitial edema has resolved. Mild patchy bilateral lower lobe opacities, likely atelectasis. Electronically Signed   By: Julian Hy M.D.   On: 06/15/2017 18:06    Assessment and Plan:   1. Tachy-brady, AFlutter w/RVR, remains in 120's     Neg VQ for PE     On low dose BB, has of significant bradycardia with BB      Recommend PPM then medicines for rate control.     Dr. Caryl Comes discussed PPM procedure with the patient, he is somewhat reluctant and would like some time to think it over.  We have placed him tentatively on tomorrow's schedule, will give clear breakfast then NPO.  2. PAFib/flutter     CHA2DS2Vasc is 3, no an a/c candidate with hx of severe retroperitoneal bleed on heparin     Not rhythm control candidate given no a/c  3. HTN     No changes  4. Chronic CHF (diastolic)     Exam does not suggest fluid OL  5. CKD (IV)     This stay appear unchanged form last hospital stay, though baseline Creat looks more like 2.5-3.5     Deferred to medicine team          For questions or updates, please contact [place] Please consult [email or web address] for contact info under Cardiology/STEMI.   Signed, Baldwin [place], PA-C  06/17/2017 3:55 PM   Pt interviewed and examined Asymptomatic Atrial flutter with 2:1 Block Hx of significant bradycardia with BB Significant retroperitoneal bleed on heparin  short term CKD HFpEF   He is not felt to be a candidate for antocoagualtion and I dont see an indication for ASA- I would favor stopping it Not being a candidate prob even for short term anticoagulation, would not favor approach of TEE/flutter ablation, furthermore, at his age, the likelihood of atrial fib is so high, it is not likely that an ablation of atrial flutter will significantly reduce TE risk The big issues are the risk of  tachy induced cardiomyopathy and/or aggravation of his HFpEF on the one side and bradycardia on the other  We have discussed the role of pacing for control of bradycardia, with efforts thereafter to control rate with meds.   If unsuccessful, can consider subsequent av nodal ablation.    He is agreeable to the priniciple of the approach but would like to consider it further overnight prior to making a decision  Will l write orders and place on [place] and colleagues will review situation with his family and him in the am

## 2017-06-17 NOTE — Consult Note (Signed)
Cardiology Consultation:   Patient ID: Johnny Morrow; [id number]; 1919-11-26   Admit date: 06/15/2017 Date of Consult: 06/17/2017  Primary Care Provider: Wenda Low, MD Primary Cardiologist: Dr. Harrington Challenger Primary Electrophysiologist:     Patient Profile:   Johnny Morrow is a 83 y.o. male with a hx of atrial fib/flutter, HTN, chronic diastolic heart failure, CKD stage III, hx of DVT, hx of bleeding on [facility],  who is being seen today for the evaluation of atrial flutter with RVR at the request of Dr. Cruzita Lederer.  History of Present Illness:   Mr. Johnny Morrow is known to this service and last saw Dr. Harrington Challenger in clinic on 10/17/16. It was then noted that he also follows with [facility].  He was seen in the hospital on 06/07/17 for cardiac clearance for hip surgery. It was noted then that he had a recent admission at [facility] (11/2016) and had atrial flutter with RVR. He failed two beta blocker trials secondary to profound bradycardia. He also developed large retroperitoneal bleed on heparin. He is now maintained on ASA alone. When he was seen by our service for cardiac clearance for hip surgery in 05/2017, he maintained sinus bradycardia throughout the hospitalization.   He was brought to [facility] with weakness as his chief complaint. He was found to have rapid heart rate in the 120s and mildly elevated troponin. Given his history of DVT, VQ scan was obtained and was negative for PE. The SNF reports he has had rapid heart rate in the 120s for 1-2 days. Troponin obtained and have been mild and flat in a pattern more consistent with demand ischemia in the setting of ongoing tachycardia.   On my interview, the patient is unaware of his rhythm and rate. He denies chest pain, shortness of breath, palpitations, and syncope. He is not dizzy and denies lower extremity swelling. He denies recent illness, fever, chills, nausea, and vomiting.  I spoke with him about possible medication options for better rate control, including  calcium channel blocker and amiodarone. He is not an anticoagulation candidate and therefore not a cardioversion candidate.   Past Medical History:  Diagnosis Date  . Anemia   . Arthritis   . Atrial fibrillation ([place])   . Cataract    "ready to come out on both sides; anytime" (06/16/2017)  . CHF (congestive heart failure) ([place])   . CKD (chronic kidney disease), stage III ([place])    "followed by Dr. Keturah Barre @ [place]; CKD fluctuates between III and IV" (06/16/2017)  . DVT (deep venous thrombosis) (H[facility])    LLE  . Heart murmur   . History of blood transfusion ~ 01/2017; 05/2017   "blood loss; anemia"  . Hypertension   . Pneumonia 12/2016   "a touch"  . Prostate cancer [facility])     follows with hematologists q 6 months. S/p radical prostatectomy and proton therapy in 2001.  . Radiation cystitis   . Retinal hemorrhage of both eyes    follows with ophthalmologists; "never had OR" (06/16/2017)  . Retroperitoneal bleed ?12/2016   [name] 06/15/2017    Past Surgical History:  Procedure Laterality Date  . FRACTURE SURGERY    . INGUINAL HERNIA REPAIR    . INTRAMEDULLARY (IM) NAIL INTERTROCHANTERIC Right 06/07/2017   Procedure: INTRAMEDULLARY (IM) NAIL INTERTROCHANTRIC;  Surgeon: [name], MD;  Location: [place];  Service: Orthopedics;  Laterality: Right;  . PROSTATECTOMY    . TIBIA FRACTURE SURGERY Left 06/2012  . TONSILLECTOMY       Home Medications:  Prior to Admission medications   Medication Sig Start Date End Date Taking? Authorizing Provider  albuterol (PROVENTIL HFA;VENTOLIN HFA) 108 (90 Base) MCG/ACT inhaler Inhale 2 puffs into the lungs every 6 (six) hours as needed for wheezing or shortness of breath. 08/20/16  Yes [place]ique, Betty G, MD  amLODipine (NORVASC) 10 MG tablet Take 1 tablet (10 mg total) by mouth daily. 03/09/17  Yes [place], Betty G, MD  aspirin EC 81 MG tablet Take 1 tablet (81 mg total) by mouth 2 (two) times daily. 06/08/17  Yes Corky Sing, PA-C  B Complex Vitamins  (VITAMIN-B COMPLEX PO) Take 1 tablet by mouth daily.    Yes [provider]  bisacodyl (DULCOLAX) 5 MG EC tablet Take 1 tablet (5 mg total) by mouth daily as needed for moderate constipation. 06/12/17  Yes Nita Sells, MD  Cholecalciferol (VITAMIN D3) 5000 units TABS Take 5,000 Units by mouth daily.    Yes [provider]  Coenzyme Q10 (CO Q 10) 100 MG CAPS Take 100 mg by mouth daily.   Yes [provider]  doxazosin (CARDURA) 1 MG tablet Take 1 mg by mouth every evening.   Yes [provider]  ferrous sulfate 325 (65 FE) MG tablet Take 1 tablet (325 mg total) by mouth daily with breakfast. 12/22/16  Yes Martinique, Betty G, MD  furosemide (LASIX) 40 MG tablet Take 40 mg by mouth daily as needed (IF A WEIGHT GAIN OF 2 POUNDS OR MORE OCCURS).    Yes [provider]  isosorbide mononitrate (IMDUR) 60 MG 24 hr tablet Take 60 mg by mouth daily.   Yes [provider]  Multiple Vitamin (MULTI VITAMIN PO) Take 1 tablet by mouth daily.    Yes [provider]  Omega 3 1000 MG CAPS Take 2,000 mg by mouth daily.   Yes [provider]  polyethylene glycol (MIRALAX / GLYCOLAX) packet Take 17 g by mouth daily. 06/12/17  Yes Nita Sells, MD  Probiotic Product (PROBIOTIC PO) Chew 1 tablet (250 million cell) by mouth once a day   Yes [provider]  senna (SENOKOT) 8.6 MG TABS tablet Take 1 tablet by mouth every morning.   Yes [provider]  traMADol (ULTRAM) 50 MG tablet Take 1 tablet (50 mg total) by mouth every 6 (six) hours as needed for moderate pain. 06/09/17  Yes Corky Sing, PA-C  Elastic Bandages & Supports (LUMBAR BACK BRACE/SUPPORT PAD) MISC Use as directed for lumbar scoliosis 09/25/15   Binnie Rail, MD  hydrALAZINE (APRESOLINE) 50 MG tablet Take 1 tablet (50 mg total) by mouth 3 (three) times daily. 12/22/16   [place], Betty G, MD    Inpatient Medications: Scheduled Meds: . acidophilus  1  capsule Oral Daily  . aspirin EC  81 mg Oral BID  . cholecalciferol  5,000 Units Oral Daily  . doxazosin  1 mg Oral QPM  . ferrous sulfate  325 mg Oral Q breakfast  . isosorbide mononitrate  30 mg Oral Daily  . metoprolol succinate  25 mg Oral Daily  . polyethylene glycol  17 g Oral Daily  . sodium chloride flush  3 mL Intravenous Q12H   Continuous Infusions: . sodium chloride    . sodium chloride Stopped (06/17/17 1100)   PRN Meds: sodium chloride, bisacodyl, levalbuterol, sodium chloride flush, traMADol  Allergies:    Allergies  Allergen Reactions  . Iodinated Diagnostic Agents Rash  . Ace Inhibitors Other (See Comments)    Noted (  on MAR) to be "allergic," but no reaction was cited/noted  . Shellfish-Derived Products Other (See Comments)    Noted (on MAR) to be "allergic," but no reaction was cited/noted  . Penicillins Rash    Has patient had a PCN reaction causing immediate rash, facial/tongue/throat swelling, SOB or lightheadedness with hypotension: Yes Has patient had a PCN reaction causing severe rash involving mucus membranes or skin necrosis: Unknown Has patient had a PCN reaction that required hospitalization: Unknown Has patient had a PCN reaction occurring within the last 10 years: Unknown If all of the above answers are "NO", then may proceed with Cephalosporin use.     Social History:   Social History   Social History  . Marital status: Married    Spouse name: N/A  . Number of children: N/A  . Years of education: N/A   Occupational History  . Not on file.   Social History Main Topics  . Smoking status: Former Smoker    Years: 10.00    Types: Cigarettes    Quit date: 17  . Smokeless tobacco: Never Used  . Alcohol use 0.6 oz/week    1 Glasses of wine per week  . Drug use: No  . Sexual activity: No   Other Topics Concern  . Not on file   Social History Narrative  . No narrative on file    Family History:    Family History  Problem Relation  Age of Onset  . COPD Brother   . Heart attack Brother   . Cancer Father   . Heart attack Father   . Diabetes Mother      ROS:  Please see the history of present illness.  ROS  All other ROS reviewed and negative.     Physical Exam/Data:   Vitals:   06/17/17 0308 06/17/17 0831 06/17/17 1200 06/17/17 1202  BP: 110/80 111/80 (!) 129/94 (!) 129/94  Pulse: (!) 117 (!) 119 (!) 121 (!) 117  Resp: (!) 25 (!) 21 20 (!) 21  Temp: (!) 97.4 F (36.3 C) 98.4 F (36.9 C)  97.9 F (36.6 C)  TempSrc: Oral Oral  Oral  SpO2: 96% 96% 97% 98%  Weight:      Height:        Intake/Output Summary (Last 24 hours) at 06/17/17 1244 Last data filed at 06/17/17 1242  Gross per 24 hour  Intake             1873 ml  Output              525 ml  Net             1348 ml   Filed Weights   06/15/17 1709  Weight: 125 lb (56.7 kg)   Body mass index is 21.46 kg/m.  General:  Well nourished, well developed, in no acute distress HEENT: normal Neck: no JVD Vascular: No carotid bruits Cardiac:  Regular rhythm, tachycardic rate, murmur difficult Lungs:  clear to auscultation bilaterally, no wheezing, rhonchi or rales  Abd: soft, nontender, no hepatomegaly  Ext: no edema Musculoskeletal:  No deformities, BUE and BLE strength normal and equal Skin: warm and dry  Neuro:  CNs 2-12 intact, no focal abnormalities noted Psych:  Normal affect   EKG:  The EKG was personally reviewed and demonstrates:  2:1 atrial flutter Telemetry:  Telemetry was personally reviewed and demonstrates:  HR in the 122-124  Relevant CV Studies:  Echocardiogram 06/16/17: pending read  Echo 01/10/17: Study Conclusions -  Left ventricle: The cavity size was normal. There was moderate   concentric hypertrophy. Systolic function was vigorous. The   estimated ejection fraction was in the range of 65% to 70%. Wall   motion was normal; there were no regional wall motion   abnormalities. Doppler parameters are consistent with  abnormal   left ventricular relaxation (grade 1 diastolic dysfunction).   Doppler parameters are consistent with elevated ventricular   end-diastolic filling pressure. - Aortic valve: Valve mobility was restricted. There was mild   stenosis. There was mild regurgitation. Mean gradient (S): 15 mm   Hg. Valve area (VTI): 1.66 cm^2. Valve area (Vmax): 1.4 cm^2.   Valve area (Vmean): 1.37 cm^2. - Aortic root: The aortic root was normal in size. - Ascending aorta: The ascending aorta was normal in size. - Mitral valve: Calcified annulus. There was mild regurgitation. - Left atrium: The atrium was mildly dilated. - Right ventricle: The cavity size was normal. Wall thickness was   normal. Systolic function was normal. - Right atrium: The atrium was normal in size. - Tricuspid valve: There was mild regurgitation. - Pulmonic valve: There was mild regurgitation. - Pulmonary arteries: The main pulmonary artery was normal-sized.   Systolic pressure was at the upper limits of normal. PA peak   pressure: 35 mm Hg (S). - Inferior vena cava: The vessel was normal in size. The   respirophasic diameter changes were in the normal range (= 50%),   consistent with normal central venous pressure. - Pericardium, extracardiac: There was no pericardial effusion.  Laboratory Data:  Chemistry Recent Labs Lab 06/15/17 1730 06/16/17 0435 06/17/17 0230  NA 134* 135 134*  K 3.9 3.7 3.9  CL 105 108 107  CO2 17* 17* 18*  GLUCOSE 118* 127* 101*  BUN 123* 119* 111*  CREATININE 4.56* 4.39* 4.27*  CALCIUM 8.6* 8.4* 8.3*  GFRNONAA 10* 10* 11*  GFRAA 12* 12* 12*  ANIONGAP 12 10 9      Recent Labs Lab 06/12/17 0308  ALBUMIN 2.4*   Hematology Recent Labs Lab 06/15/17 1730 06/16/17 0435 06/17/17 0230  WBC 8.1 5.4 4.8  RBC 2.48* 2.82* 2.80*  HGB 7.0* 8.0* 8.0*  HCT 20.4* 23.1* 23.1*  MCV 82.3 81.9 82.5  MCH 28.2 28.4 28.6  MCHC 34.3 34.6 34.6  RDW 14.3 14.0 13.9  PLT 181 163 173   Cardiac  Enzymes Recent Labs Lab 06/15/17 1730 06/16/17 0435 06/16/17 1211  TROPONINI 0.06* 0.15* 0.12*   No results for input(s): TROPIPOC in the last 168 hours.  BNPNo results for input(s): BNP, PROBNP in the last 168 hours.  DDimer No results for input(s): DDIMER in the last 168 hours.  Radiology/Studies:  Nm Pulmonary Vent And Perf (v/q Scan)  Result Date: 06/15/2017 CLINICAL DATA:  Tachycardia and weakness. Recent hip surgery. PE suspected, high pretest prob EXAM: NUCLEAR MEDICINE VENTILATION - PERFUSION LUNG SCAN TECHNIQUE: Ventilation images were obtained in multiple projections using inhaled aerosol Tc-91m DTPA. Perfusion images were obtained in multiple projections after intravenous injection of Tc-72m MAA. RADIOPHARMACEUTICALS:  31.6 mCi Technetium-59m DTPA aerosol inhalation and 4.40 mCi Technetium-34m MAA IV COMPARISON:  Chest radiograph earlier this day FINDINGS: Ventilation: Diffusely heterogeneous, no focal ventilation defect. Perfusion: No wedge shaped peripheral perfusion defects to suggest acute pulmonary embolism. Enlarged cardiac silhouette is noted. Perfusion more homogeneous on ventilation. IMPRESSION: 1. Low probability for pulmonary embolus. 2. Profusion is more homogeneous than ventilation, which can be seen with airways disease. Electronically Signed   By: Jeb Levering  M.D.   On: 06/15/2017 23:22   Dg Chest Portable 1 View  Result Date: 06/15/2017 CLINICAL DATA:  Tachycardia EXAM: PORTABLE CHEST 1 VIEW COMPARISON:  06/07/2017 FINDINGS: Prior interstitial prominence has improved/resolved, suggesting resolution of interstitial edema. Mild patchy bilateral lower lobe opacities, likely atelectasis. No definite pleural effusions. No pneumothorax. Cardiomegaly. Midthoracic dextroscoliosis. IMPRESSION: Prior interstitial edema has resolved. Mild patchy bilateral lower lobe opacities, likely atelectasis. Electronically Signed   By: Julian Hy M.D.   On: 06/15/2017 18:06     Assessment and Plan:   1. Atrial flutter, possible tachy-brady (baseline is sinus in the 50-60s) - EKG on 06/15/17 with ectopic tachycardia vs Atrial flutter that appears to be A flutter - EKG on 06/17/17 with tachycardia vs atrial flutter with 2:1 conduction - telemetry review shows a HR in the 120s without fluctuations, suggestive of a flutter rhythm - he has a history of Atrial flutter intolerant to beta blockers secondary to bradycardia - history of retroperitoneal bleed in the setting of anticoagulation - will avoid AC at this time - has been rate controlled without medications - continue ASA, imdur Will discuss with attending possible use of calcium channel blocker such as low dose diltiazem for rate control. It sounds as though he has been intolerant to beta blockers in the past, including labetalol and coreg. However, he is currently on toprol 25 mg daily. He is not a cardioversion candidate since he does not tolerate anticoagulation. He is hemodynamically stable. Will consult EP for possible ablation vs medication management.   2. HTN - home med norvasc and hydralazine on hold for titration of rate-controlling medications.   3. CKD stage III-IV - will limit medication selection - per primary team   4. Chronic diastolic heart failure - echo with grade 1 DD - does not appear to be volume up on exam - will hold lasix for now   5. Mild aortic stenosis, moderate tricuspid valve regurgitation - repeat echo pending - no SOB, dizziness, or syncope - medical management    For questions or updates, please contact [place] Please consult [email or web address] for contact info under Cardiology/STEMI.   Signed, Ledora Bottcher, PA  06/17/2017 12:44 PM   History and all data above reviewed.  Patient examined.  I agree with the findings as above.  We have looked through his recent office, hospital (including [facility]) notes.  Complicated recent history in an elderly patient who  has otherwise been functional aside from a recent fall and fracture.  Now presents with tachycardia of unknown duration that looks to be flutter.  He has had this before.  However, he has had complications with heparin and has baseline bradycardia and trouble with beta blockers recently.  He does not feel the palpitations and has no acute SOB or pain.  He is week but has been ambulating at [employer] with and without a cane without complications.   The patient exam reveals COR:RRR, systolic murmur  ,  Lungs: Basilar crackles  ,  Abd: Positive bowel sounds, no rebound no guarding, Ext No edema  .  All available labs, radiology testing, previous records reviewed. Agree with documented assessment and plan. Atrial flutter/tachybrady:  I think this would be very difficult to treat medically.  Anticoagulation would be high risk.  I spoke with Dr. Caryl Comes who will see the patient today and discuss possible AV node ablation with pacing.  Alternative we could consider flutter ablation but this would require some anticoagulation and would be higher risk.  CHRONIC DIASTOLIC HF:  He appears to be euvolemic.  No change in therapy.    Jeneen Rinks Keevan Wolz  3:02 PM  06/17/2017

## 2017-06-18 ENCOUNTER — Encounter (HOSPITAL_COMMUNITY)
Admission: EM | Disposition: A | Discharge status: Skilled Nursing Facility | Payer: Self-pay | Source: Home / Self Care | Attending: Internal Medicine

## 2017-06-18 ENCOUNTER — Encounter (HOSPITAL_COMMUNITY): Payer: Self-pay | Admitting: Nurse Practitioner

## 2017-06-18 DIAGNOSIS — R001 Bradycardia, unspecified: Secondary | ICD-10-CM

## 2017-06-18 DIAGNOSIS — N184 Chronic kidney disease, stage 4 (severe): Secondary | ICD-10-CM

## 2017-06-18 DIAGNOSIS — I4892 Unspecified atrial flutter: Principal | ICD-10-CM

## 2017-06-18 LAB — BASIC METABOLIC PANEL
ANION GAP: 9 (ref 5–15)
BUN: 110 mg/dL — ABNORMAL HIGH (ref 6–20)
CO2: 18 mmol/L — AB (ref 22–32)
Calcium: 8.3 mg/dL — ABNORMAL LOW (ref 8.9–10.3)
Chloride: 108 mmol/L (ref 101–111)
Creatinine, Ser: 4.19 mg/dL — ABNORMAL HIGH (ref 0.61–1.24)
GFR calc Af Amer: 13 mL/min — ABNORMAL LOW (ref >60)
GFR calc non Af Amer: 11 mL/min — ABNORMAL LOW (ref >60)
GLUCOSE: 96 mg/dL (ref 65–99)
POTASSIUM: 3.8 mmol/L (ref 3.5–5.1)
Sodium: 135 mmol/L (ref 135–145)

## 2017-06-18 LAB — CBC
HEMATOCRIT: 22.7 % — AB (ref 39.0–52.0)
HEMOGLOBIN: 7.8 g/dL — AB (ref 13.0–17.0)
MCH: 28.2 pg (ref 26.0–34.0)
MCHC: 34.4 g/dL (ref 30.0–36.0)
MCV: 81.9 fL (ref 78.0–100.0)
Platelets: 187 10*3/uL (ref 150–400)
RBC: 2.77 MIL/uL — ABNORMAL LOW (ref 4.22–5.81)
RDW: 13.8 % (ref 11.5–15.5)
WBC: 5.4 10*3/uL (ref 4.0–10.5)

## 2017-06-18 SURGERY — PACEMAKER IMPLANT

## 2017-06-18 MED ORDER — DILTIAZEM HCL 60 MG PO TABS
60.0000 mg | ORAL_TABLET | Freq: Four times a day (QID) | ORAL | Status: DC
  Administered 2017-06-18 – 2017-06-19 (×5): 60 mg via ORAL
  Filled 2017-06-18 (×5): qty 1

## 2017-06-18 NOTE — Progress Notes (Signed)
PROGRESS NOTE  Johnny Morrow XBM:[id number] DOB: [birth year] DOA: 06/15/2017 PCP: Wenda Low, MD   LOS: 1 day   Brief Narrative / Interim history: 83 year old male with history of a flutter with intolerance to beta-blockers due to bradycardia, stage IV chronic kidney disease with a baseline creatinine of 4.0, prostate cancer status post prostatectomy and radiation therapy in 1993 chronic nonocclusive thrombus in the left popliteal vein, moderate aortic stenosis, severe retroperitoneal bleed in the setting of anticoagulation, chronic diastolic CHF, recent hip repair in October 2018, who was admitted to the hospital  from SNF with elevated heart rates into the 140s.  Assessment & Plan: Principal Problem:   Tachycardia Active Problems:   Essential hypertension, benign   History of DVT of lower extremity, left   Moderate tricuspid regurgitation   Mild aortic stenosis   Chronic kidney disease (CKD), stage IV (severe) (HCC)   AF (paroxysmal atrial fibrillation) (HCC)   Closed intertrochanteric fracture of hip, right, initial encounter (HCC) 10 /18   Retroperitoneal bleed 5/18   Chronic diastolic CHF (congestive heart failure) (HCC)  A flutter -Not on anticoagulation due to RP bleed in May 2019 -Has a history of bradycardia on beta-blockers -cardiology/EP following, d/w Dr. Rayann Heman this morning, no need for pacemaker, manage medically  Troponin elevation -No chest pain, likely demand ischemia  Chronic kidney disease stage IV -Patient with baseline creatinine around 4.5, creatinine 4.8 on admission, 4.19 this morning.  -good po intake, off fluids now  Anemia of renal disease -Hemoglobin 7.8 this morning, overall stable since yesterday.  Recent hip repair -Status post repair by Dr. Doran Durand 10/21 right IM nail-needs hip precautions as per orthopedics -PT consulted  Chronic diastolic CHF -Last echo 02-72% in May 2017.  2D echo pending -Stop fluids today   DVT prophylaxis:  SCDs Code Status: Full code Family Communication: no family at bedside today Disposition Plan: SNF when ready   Consultants:   Cardiology / EP  Procedures:   None   Antimicrobials:  None    Subjective: - no chest pain, shortness of breath, no abdominal pain, nausea or vomiting.   Objective: Vitals:   06/18/17 0400 06/18/17 0416 06/18/17 0859 06/18/17 1142  BP: (!) 128/100 (!) 128/97 (!) 136/94 (!) 120/105  Pulse:  (!) 120 (!) 119 (!) 118  Resp:  18 16 19   Temp:  (!) 97.5 F (36.4 C) 98.1 F (36.7 C) (!) 97.2 F (36.2 C)  TempSrc:  Oral Oral Oral  SpO2:  97% 94% 94%  Weight:  56.7 kg (125 lb)    Height:        Intake/Output Summary (Last 24 hours) at 06/18/17 1212 Last data filed at 06/18/17 1212  Gross per 24 hour  Intake              870 ml  Output              550 ml  Net              320 ml   Filed Weights   06/15/17 1709 06/18/17 0416  Weight: 56.7 kg (125 lb) 56.7 kg (125 lb)    Examination:  Constitutional: NAD, calm, comfortable Eyes:  lids and conjunctivae normal ENMT: Mucous membranes are moist.  Respiratory: clear to auscultation bilaterally, no wheezing, no crackles. Normal respiratory effort.  Cardiovascular: Regular tachycardic. No LE edema. 2+ pedal pulses.  Abdomen: no tenderness. Bowel sounds positive.  Skin: no rashes, lesions, ulcers. No induration Neurologic: non focal   Data  Reviewed: I have independently reviewed following labs and imaging studies   CBC:  Recent Labs Lab 06/12/17 0308 06/15/17 06/15/17 1730 06/16/17 0435 06/17/17 0230 06/18/17 0247  WBC 4.9 8.7 8.1 5.4 4.8 5.4  NEUTROABS  --   --  5.9  --  3.0  --   HGB 7.9* 7.8* 7.0* 8.0* 8.0* 7.8*  HCT 22.3* 24* 20.4* 23.1* 23.1* 22.7*  MCV 81.4  --  82.3 81.9 82.5 81.9  PLT 100* 141* 181 163 173 314   Basic Metabolic Panel:  Recent Labs Lab 06/12/17 0308 06/15/17 06/15/17 1730 06/16/17 0435 06/17/17 0230 06/18/17 0247  NA 136 139 134* 135 134* 135  K 3.6  4.2 3.9 3.7 3.9 3.8  CL 108  --  105 108 107 108  CO2 18*  --  17* 17* 18* 18*  GLUCOSE 124*  --  118* 127* 101* 96  BUN 91* 114* 123* 119* 111* 110*  CREATININE 4.86* 4.6* 4.56* 4.39* 4.27* 4.19*  CALCIUM 8.4*  --  8.6* 8.4* 8.3* 8.3*  PHOS 3.6  --   --   --   --   --    GFR: Estimated Creatinine Clearance: 8.6 mL/min (A) (by C-G formula based on SCr of 4.19 mg/dL (H)). Liver Function Tests:  Recent Labs Lab 06/12/17 0308  ALBUMIN 2.4*   No results for input(s): LIPASE, AMYLASE in the last 168 hours. No results for input(s): AMMONIA in the last 168 hours. Coagulation Profile: No results for input(s): INR, PROTIME in the last 168 hours. Cardiac Enzymes:  Recent Labs Lab 06/15/17 1730 06/16/17 0435 06/16/17 1211  TROPONINI 0.06* 0.15* 0.12*   BNP (last 3 results)  Recent Labs  08/20/16 1221 12/22/16 1453  PROBNP 373.0* 999.0*   HbA1C: No results for input(s): HGBA1C in the last 72 hours. CBG: No results for input(s): GLUCAP in the last 168 hours. Lipid Profile: No results for input(s): CHOL, HDL, LDLCALC, TRIG, CHOLHDL, LDLDIRECT in the last 72 hours. Thyroid Function Tests:  Recent Labs  06/16/17 0435  TSH 4.056   Anemia Panel: No results for input(s): VITAMINB12, FOLATE, FERRITIN, TIBC, IRON, RETICCTPCT in the last 72 hours. Urine analysis:    Component Value Date/Time   COLORURINE YELLOW 06/07/2017 Intercourse 06/07/2017 1313   LABSPEC 1.013 06/07/2017 1313   PHURINE 5.0 06/07/2017 1313   GLUCOSEU NEGATIVE 06/07/2017 1313   HGBUR NEGATIVE 06/07/2017 1313   BILIRUBINUR NEGATIVE 06/07/2017 1313   KETONESUR NEGATIVE 06/07/2017 1313   PROTEINUR 100 (A) 06/07/2017 1313   NITRITE NEGATIVE 06/07/2017 1313   LEUKOCYTESUR NEGATIVE 06/07/2017 1313   Sepsis Labs: Invalid input(s): PROCALCITONIN, LACTICIDVEN  Recent Results (from the past 240 hour(s))  MRSA PCR Screening     Status: None   Collection Time: 06/16/17  1:38 PM  Result Value  Ref Range Status   MRSA by PCR NEGATIVE NEGATIVE Final    Comment:        The GeneXpert MRSA Assay (FDA approved for NASAL specimens only), is one component of a comprehensive MRSA colonization surveillance program. It is not intended to diagnose MRSA infection nor to guide or monitor treatment for MRSA infections.     Radiology Studies: No results found. Scheduled Meds: . acidophilus  1 capsule Oral Daily  . aspirin EC  81 mg Oral BID  . cholecalciferol  5,000 Units Oral Daily  . diltiazem  60 mg Oral Q6H  . doxazosin  1 mg Oral QPM  . ferrous sulfate  325  mg Oral Q breakfast  . isosorbide mononitrate  30 mg Oral Daily  . polyethylene glycol  17 g Oral Daily  . sodium chloride flush  3 mL Intravenous Q12H   Continuous Infusions: . sodium chloride      Marzetta Board, MD, PhD [facility] Pager [phone]  If 7PM-7AM, please contact night-coverage [email or web address] Password [email or web address] 06/18/2017, 12:12 PM

## 2017-06-18 NOTE — Care Management Note (Signed)
Case Management Note Marvetta Gibbons RN, BSN Unit 4E-Case Manager [phone]  Patient Details  Name: Johnny Morrow MRN: [id number] Date of Birth: 08/12/1920  Subjective/Objective:   Pt with recent hip repair- admitted with elevated HR-aflutter - plan to manage medically no need for pacemaker at this time.                 Action/Plan: PTA pt lived at [facility]- was in their rehab SNF but has an [place] apartment- CSW following for return to SNF level at [facility] at time of discharge.   Expected Discharge Date:                  Expected Discharge Plan:  Skilled Nursing Facility  In-House Referral:  Clinical Social Work  Discharge planning Services  CM Consult  Post Acute Care Choice:    Choice offered to:     DME Arranged:    DME Agency:     HH Arranged:    [place] Agency:     Status of Service:  In process, will continue to follow  If discussed at Long Length of Stay Meetings, dates discussed:    Discharge Disposition: skilled facility  Additional Comments:  Dawayne Patricia, RN 06/18/2017, 2:17 PM

## 2017-06-18 NOTE — Evaluation (Addendum)
Physical Therapy Evaluation Patient Details Name: Johnny Morrow MRN: [id number] DOB: Feb 24, 1920 Today's Date: 06/18/2017   History of Present Illness  Pt is a 83 y/o male admitted from SNF secondary to tachycardia. Pt with recent IM nail R hip fx on 06/07/17 and d/c'ed to SNF at [place]. PMH including but not limited to a-fib, HTN, CHF and CKD.  Clinical Impression  Pt presented supine in bed with HOB elevated, awake and willing to participate in therapy session. Prior to admission, pt was at [facility] for rehab following a recent R hip fx. Pt reported that he was ambulating with use of RW and assistance from staff. Pt currently very limited secondary to fatigue and R hip pain. Pt only tolerating bed mobility with mod A and sitting EOB with mod-max A to maintain sitting balance. Pt would continue to benefit from skilled physical therapy services at this time while admitted and after d/c to address the below listed limitations in order to improve overall safety and independence with functional mobility.  HR maintained in the low 120's throughout.     Follow Up Recommendations SNF    Equipment Recommendations  None recommended by PT    Recommendations for Other Services       Precautions / Restrictions Precautions Precautions: Fall Precaution Comments: watch HR      Mobility  Bed Mobility Overal bed mobility: Needs Assistance Bed Mobility: Sit to Supine;Supine to Sit     Supine to sit: Mod assist Sit to supine: Mod assist   General bed mobility comments: increased time and effort, cueing for sequencing, use of bed pads to position pt's hips, assist for R LE movement and to elevate trunk  Transfers                 General transfer comment: pt refusing secondary to fatigue  Ambulation/Gait                Stairs            Wheelchair Mobility    Modified Rankin (Stroke Patients Only)       Balance Overall balance assessment: Needs  assistance Sitting-balance support: Feet supported;Bilateral upper extremity supported Sitting balance-Leahy Scale: Poor Sitting balance - Comments: requiring min-max A                                     Pertinent Vitals/Pain Pain Assessment: Faces Faces Pain Scale: Hurts even more Pain Location: R hip with movement Pain Descriptors / Indicators: Discomfort;Grimacing;Guarding Pain Intervention(s): Monitored during session;Repositioned    Home Living Family/patient expects to be discharged to:: Skilled nursing facility                      Prior Function Level of Independence: Needs assistance   Gait / Transfers Assistance Needed: ambulates with RW  ADL's / Homemaking Assistance Needed: staff assisting with ADLs        Hand Dominance        Extremity/Trunk Assessment   Upper Extremity Assessment Upper Extremity Assessment: Generalized weakness    Lower Extremity Assessment Lower Extremity Assessment: Generalized weakness;RLE deficits/detail RLE Deficits / Details: very weak and limited secondary to pain, requiring assistance with movement during functional mobility       Communication   Communication: No difficulties  Cognition Arousal/Alertness: Awake/alert Behavior During Therapy: Flat affect Overall Cognitive Status: Within Functional Limits for tasks assessed  General Comments      Exercises     Assessment/Plan    PT Assessment Patient needs continued PT services  PT Problem List Decreased strength;Decreased range of motion;Decreased activity tolerance;Decreased balance;Decreased mobility;Decreased coordination;Decreased knowledge of use of DME;Decreased safety awareness;Decreased knowledge of precautions;Cardiopulmonary status limiting activity;Pain       PT Treatment Interventions DME instruction;Gait training;Stair training;Functional mobility training;Therapeutic  activities;Therapeutic exercise;Balance training;Neuromuscular re-education;Patient/family education    PT Goals (Current goals can be found in the Care Plan section)  Acute Rehab PT Goals Patient Stated Goal: return to rehab PT Goal Formulation: With patient Time For Goal Achievement: 07/02/17 Potential to Achieve Goals: Good    Frequency Min 2X/week   Barriers to discharge        Co-evaluation               AM-PAC PT "6 Clicks" Daily Activity  Outcome Measure Difficulty turning over in bed (including adjusting bedclothes, sheets and blankets)?: Unable Difficulty moving from lying on back to sitting on the side of the bed? : Unable Difficulty sitting down on and standing up from a chair with arms (e.g., wheelchair, bedside commode, etc,.)?: Unable Help needed moving to and from a bed to chair (including a wheelchair)?: A Lot Help needed walking in hospital room?: A Lot Help needed climbing 3-5 steps with a railing? : Total 6 Click Score: 8    End of Session   Activity Tolerance: Patient limited by fatigue Patient left: in bed;with call bell/phone within reach Nurse Communication: Mobility status PT Visit Diagnosis: Other abnormalities of gait and mobility (R26.89)    Time: 0160-1093 PT Time Calculation (min) (ACUTE ONLY): 17 min   Charges:   PT Evaluation $PT Eval Moderate Complexity: 1 Mod     PT G Codes:        [place], PT, DPT [place] 06/18/2017, 5:21 PM

## 2017-06-18 NOTE — Progress Notes (Signed)
Progress Note  Patient Name: Johnny Morrow Date of Encounter: 06/18/2017  Primary Cardiologist: [facility]  Subjective   Doing well today, the patient denies CP or SOB.  No new concerns  Inpatient Medications    Scheduled Meds: . acidophilus  1 capsule Oral Daily  . aspirin EC  81 mg Oral BID  . cholecalciferol  5,000 Units Oral Daily  . diltiazem  60 mg Oral Q6H  . doxazosin  1 mg Oral QPM  . ferrous sulfate  325 mg Oral Q breakfast  . isosorbide mononitrate  30 mg Oral Daily  . polyethylene glycol  17 g Oral Daily  . sodium chloride flush  3 mL Intravenous Q12H   Continuous Infusions: . sodium chloride     PRN Meds: sodium chloride, bisacodyl, levalbuterol, sodium chloride flush, traMADol   Vital Signs    Vitals:   06/17/17 2106 06/18/17 0039 06/18/17 0400 06/18/17 0416  BP: (!) 129/93 124/90 (!) 128/100 (!) 128/97  Pulse: (!) 121 (!) 119  (!) 120  Resp: (!) 28 (!) 23  18  Temp: (!) 97.1 F (36.2 C) 98.3 F (36.8 C)  (!) 97.5 F (36.4 C)  TempSrc: Oral Oral  Oral  SpO2: 100% 99%  97%  Weight:    125 lb (56.7 kg)  Height:        Intake/Output Summary (Last 24 hours) at 06/18/17 0820 Last data filed at 06/18/17 0356  Gross per 24 hour  Intake              830 ml  Output              550 ml  Net              280 ml   Filed Weights   06/15/17 1709 06/18/17 0416  Weight: 125 lb (56.7 kg) 125 lb (56.7 kg)    Telemetry    Atrial flutter, V rates 120s - Personally Reviewed  Physical Exam   GEN- The patient is elderly and thin appearing, alert  Head- normocephalic, atraumatic Eyes-  Sclera clear, conjunctiva pink Ears- hearing intact Oropharynx- clear Neck- supple, Lungs- Clear to ausculation bilaterally, normal work of breathing Heart- tachycardic irregular rhythm GI- soft, NT, ND, + BS Extremities- no clubbing, cyanosis, or edema  MS- no significant deformity or atrophy Skin- no rash or lesion Psych- euthymic mood, full affect Neuro-  strength and sensation are intact   Labs    Chemistry Recent Labs Lab 06/12/17 0308  06/16/17 0435 06/17/17 0230 06/18/17 0247  NA 136  < > 135 134* 135  K 3.6  < > 3.7 3.9 3.8  CL 108  < > 108 107 108  CO2 18*  < > 17* 18* 18*  GLUCOSE 124*  < > 127* 101* 96  BUN 91*  < > 119* 111* 110*  CREATININE 4.86*  < > 4.39* 4.27* 4.19*  CALCIUM 8.4*  < > 8.4* 8.3* 8.3*  ALBUMIN 2.4*  --   --   --   --   GFRNONAA 9*  < > 10* 11* 11*  GFRAA 11*  < > 12* 12* 13*  ANIONGAP 10  < > 10 9 9   < > = values in this interval not displayed.   Hematology Recent Labs Lab 06/16/17 0435 06/17/17 0230 06/18/17 0247  WBC 5.4 4.8 5.4  RBC 2.82* 2.80* 2.77*  HGB 8.0* 8.0* 7.8*  HCT 23.1* 23.1* 22.7*  MCV 81.9 82.5 81.9  MCH 28.4  28.6 28.2  MCHC 34.6 34.6 34.4  RDW 14.0 13.9 13.8  PLT 163 173 187    Cardiac Enzymes Recent Labs Lab 06/15/17 1730 06/16/17 0435 06/16/17 1211  TROPONINI 0.06* 0.15* 0.12*   No results for input(s): TROPIPOC in the last 168 hours.      Assessment & Plan    1. Atrial flutter with RVR Not a candidate for anticoagulation per Dr Olin Pia notes Will switch metoprolol to cardizem and titrate as able for goal HR < 100.  I have started cardizem 60mg  q6h. Asymptomatic,  Not an ablation candidate  2. Prior sinus bradycardia Asymptomatic It is not clear that he will have sinus rhythm again as he cannot be anticoagulation/ cardioverted.  I would therefore favor titration of diltiazem for now and avoiding pacemaker.  I have spoken at length with the patient this am.  He is clear that given his advanced age and risks that he would like to avoid pacing.  3. Anemia Per primary team  4. Stage IV chronic renal disease Stable No change required today  Very complicated patient with multiple acute and chronic comoribidites as well as advanced age.  Ultimately, a conservative/ palliative approach may be best.  Cardiology to follow with you.  Thompson Grayer MD,  [facility] 06/18/2017 8:20 AM

## 2017-06-19 DIAGNOSIS — I484 Atypical atrial flutter: Secondary | ICD-10-CM

## 2017-06-19 LAB — CBC
HCT: 22.6 % — ABNORMAL LOW (ref 39.0–52.0)
Hemoglobin: 7.9 g/dL — ABNORMAL LOW (ref 13.0–17.0)
MCH: 28.6 pg (ref 26.0–34.0)
MCHC: 35 g/dL (ref 30.0–36.0)
MCV: 81.9 fL (ref 78.0–100.0)
PLATELETS: 207 10*3/uL (ref 150–400)
RBC: 2.76 MIL/uL — AB (ref 4.22–5.81)
RDW: 14.1 % (ref 11.5–15.5)
WBC: 6.8 10*3/uL (ref 4.0–10.5)

## 2017-06-19 LAB — BASIC METABOLIC PANEL
ANION GAP: 9 (ref 5–15)
BUN: 112 mg/dL — ABNORMAL HIGH (ref 6–20)
CALCIUM: 8.3 mg/dL — AB (ref 8.9–10.3)
CO2: 18 mmol/L — ABNORMAL LOW (ref 22–32)
Chloride: 108 mmol/L (ref 101–111)
Creatinine, Ser: 4.43 mg/dL — ABNORMAL HIGH (ref 0.61–1.24)
GFR, EST AFRICAN AMERICAN: 12 mL/min — AB (ref >60)
GFR, EST NON AFRICAN AMERICAN: 10 mL/min — AB (ref >60)
Glucose, Bld: 119 mg/dL — ABNORMAL HIGH (ref 65–99)
POTASSIUM: 3.9 mmol/L (ref 3.5–5.1)
SODIUM: 135 mmol/L (ref 135–145)

## 2017-06-19 MED ORDER — BISACODYL 5 MG PO TBEC
5.0000 mg | DELAYED_RELEASE_TABLET | Freq: Once | ORAL | Status: AC
  Administered 2017-06-19: 5 mg via ORAL
  Filled 2017-06-19: qty 1

## 2017-06-19 MED ORDER — DILTIAZEM HCL ER COATED BEADS 180 MG PO CP24
300.0000 mg | ORAL_CAPSULE | Freq: Every day | ORAL | Status: DC
  Administered 2017-06-19: 10:00:00 300 mg via ORAL
  Filled 2017-06-19: qty 1

## 2017-06-19 MED ORDER — DILTIAZEM HCL ER COATED BEADS 180 MG PO CP24
360.0000 mg | ORAL_CAPSULE | Freq: Every day | ORAL | Status: DC
  Administered 2017-06-20 – 2017-06-22 (×3): 360 mg via ORAL
  Filled 2017-06-19 (×3): qty 2

## 2017-06-19 NOTE — Progress Notes (Signed)
Physical Therapy Treatment Patient Details Name: Johnny Morrow MRN: [id number] DOB: 17-Dec-1919 Today's Date: 06/19/2017    History of Present Illness Pt is a 83 y/o male admitted from SNF secondary to tachycardia. Pt with recent IM nail R hip fx on 06/07/17 and d/c'ed to SNF at [place]. PMH including but not limited to a-fib, HTN, CHF and CKD.    PT Comments    Monitored HR during treatment. 110-120 bpm throughout session. Was able to ambulate within his room. Very slow with ambulation, fatigued quickly and required frequent rest breaks. Positioned pt in recliner at end of session and encouraged pt to sit there for a couple hours. Pt will benefit from skilled PT services to increase their independence with mobility and safety.   Follow Up Recommendations  SNF     Equipment Recommendations  None recommended by PT    Recommendations for Other Services       Precautions / Restrictions Precautions Precautions: Fall Precaution Comments: watch HR Restrictions Weight Bearing Restrictions: No    Mobility  Bed Mobility Overal bed mobility: Needs Assistance       Supine to sit: Mod assist;HOB elevated     General bed mobility comments: increased time and effort, cueing for sequencing, use of bed pads to position pt's hips, assist for R LE movement and to elevate trunk  Transfers Overall transfer level: Needs assistance Equipment used: Rolling walker (2 wheeled) Transfers: Sit to/from Stand Sit to Stand: Min assist         General transfer comment: cues for hand and foot placement. cue to push up through arms and legs.   Ambulation/Gait Ambulation/Gait assistance: Min assist Ambulation Distance (Feet): 10 Feet took ~20 min to walk 10 ft with frequent rest breaks Assistive device: Rolling walker (2 wheeled) Gait Pattern/deviations: Step-to pattern;Trunk flexed;Wide base of support;Decreased step length - right;Decreased step length - left     General Gait Details: cues  for erect posture, increased step length. Pt required rest break every two steps. Reported pain in shoulder cues to not put all his weight through his arms.    Stairs            Wheelchair Mobility    Modified Rankin (Stroke Patients Only)       Balance Overall balance assessment: Needs assistance Sitting-balance support: Feet supported;Bilateral upper extremity supported Sitting balance-Leahy Scale: Poor Sitting balance - Comments: min guard to min-assist, Cues to sit up straight   Standing balance support: Bilateral upper extremity supported Standing balance-Leahy Scale: Poor Standing balance comment: relys on RW when standing                            Cognition Arousal/Alertness: Awake/alert Behavior During Therapy: Flat affect Overall Cognitive Status: Within Functional Limits for tasks assessed                                        Exercises General Exercises - Lower Extremity Ankle Circles/Pumps: AROM;10 reps;Supine;Both Quad Sets: AROM;10 reps;Supine Hip ABduction/ADduction: AROM;Left;10 reps;Supine (AAROM, R, 10 reps, supine) Hip Flexion/Marching: 10 reps;AROM;Seated;Left (AAROM, R, 5 Reps, seated)    General Comments        Pertinent Vitals/Pain Pain Assessment: No/denies pain    Home Living                      Prior  Function            PT Goals (current goals can now be found in the care plan section) Acute Rehab PT Goals Patient Stated Goal: not discussed Progress towards PT goals: Progressing toward goals    Frequency    Min 2X/week      PT Plan Current plan remains appropriate    Co-evaluation              AM-PAC PT "6 Clicks" Daily Activity  Outcome Measure  Difficulty turning over in bed (including adjusting bedclothes, sheets and blankets)?: Unable Difficulty moving from lying on back to sitting on the side of the bed? : Unable Difficulty sitting down on and standing up from a  chair with arms (e.g., wheelchair, bedside commode, etc,.)?: A Lot Help needed moving to and from a bed to chair (including a wheelchair)?: A Lot Help needed walking in hospital room?: A Lot Help needed climbing 3-5 steps with a railing? : Total 6 Click Score: 9    End of Session Equipment Utilized During Treatment: Gait belt Activity Tolerance: Patient limited by fatigue Patient left: with call bell/phone within reach;in chair Nurse Communication: Mobility status PT Visit Diagnosis: Other abnormalities of gait and mobility (R26.89)     Time: 8502-7741 PT Time Calculation (min) (ACUTE ONLY): 35 min  Charges:  $Gait Training: 8-22 mins $Therapeutic Activity: 8-22 mins                    G Codes:       [place] 06/19/2017, 4:29 PM

## 2017-06-19 NOTE — Progress Notes (Signed)
PROGRESS NOTE  Johnny Morrow GXQ:[id number] DOB: 23-Nov-1919 DOA: 06/15/2017 PCP: Wenda Low, MD   LOS: 2 days   Brief Narrative / Interim history: 83 year old male with history of a flutter with intolerance to beta-blockers due to bradycardia, stage IV chronic kidney disease with a baseline creatinine of 4.0, prostate cancer status post prostatectomy and radiation therapy in 1993 chronic nonocclusive thrombus in the left popliteal vein, moderate aortic stenosis, severe retroperitoneal bleed in the setting of anticoagulation, chronic diastolic CHF, recent hip repair in October 2018, who was admitted to the hospital  from SNF with elevated heart rates into the 140s.  Assessment & Plan: Principal Problem:   Tachycardia Active Problems:   Essential hypertension, benign   History of DVT of lower extremity, left   Moderate tricuspid regurgitation   Mild aortic stenosis   Chronic kidney disease (CKD), stage IV (severe) (HCC)   AF (paroxysmal atrial fibrillation) (HCC)   Closed intertrochanteric fracture of hip, right, initial encounter (HCC) 10 /18   Retroperitoneal bleed 5/18   Chronic diastolic CHF (congestive heart failure) (HCC)   A flutter -Not on anticoagulation due to RP bleed in May 2019 -Has a history of bradycardia on beta-blockers but seems to be tolerating calcium channel blockers well.  He was on diltiazem yesterday, heart rate varies between 80s and 120s. -cardiology/EP following -Daughter and patient's wife very concerned about patient going back to SNF while rate not better controlled, currently is in the 110-120s at rest.  They fear that SNF will send patient right back even though the patient is asymptomatic.   Troponin elevation -No chest pain, likely demand ischemia  Chronic kidney disease stage IV -Patient with baseline creatinine around 4.5, creatinine 4.8 on admission, stable this morning  Anemia of renal disease -Hemoglobin 8>>7.8>>7.9 this morning, overall  stable   Recent hip repair -Status post repair by Dr. Doran Durand 10/21 right IM nail-needs hip precautions as per orthopedics -PT consulted, recommending SNF, working on rate control as above per cardiology so that he can participate in physical therapy at rehab center.  Chronic diastolic CHF -Last echo 14-48% in May 2017   DVT prophylaxis: SCDs Code Status: Full code Family Communication: Discussed with wife and daughter at bedside Disposition Plan: SNF when ready   Consultants:   Cardiology / EP  Procedures:   None   Antimicrobials:  None    Subjective: -Feels constipated this morning, no chest pain, no palpitations  Objective: Vitals:   06/19/17 0000 06/19/17 0400 06/19/17 0510 06/19/17 0800  BP: 131/72 (!) 124/99 (!) 123/95 (!) 132/92  Pulse: 88 62  (!) 113  Resp: (!) 22 16  20   Temp: 98 F (36.7 C) 98 F (36.7 C)  98.3 F (36.8 C)  TempSrc: Oral Oral  Oral  SpO2: 98% 95%  95%  Weight:      Height:        Intake/Output Summary (Last 24 hours) at 06/19/17 1234 Last data filed at 06/19/17 0600  Gross per 24 hour  Intake              480 ml  Output             1050 ml  Net             -570 ml   Filed Weights   06/15/17 1709 06/18/17 0416  Weight: 56.7 kg (125 lb) 56.7 kg (125 lb)    Examination:  Constitutional: NAD, calm, comfortable Eyes: PERRL, lids and conjunctivae normal ENMT: Mucous  membranes are moist.  Neck: normal, supple Respiratory: clear to auscultation bilaterally, no wheezing, no crackles. Normal respiratory effort.  Cardiovascular: Regulartachycardic. No LE edema. 2+ pedal pulses.  Abdomen: no tenderness. Bowel sounds positive.  Neurologic: non focal  Data Reviewed: I have independently reviewed following labs and imaging studies   CBC:  Recent Labs Lab 06/15/17 1730 06/16/17 0435 06/17/17 0230 06/18/17 0247 06/19/17 0247  WBC 8.1 5.4 4.8 5.4 6.8  NEUTROABS 5.9  --  3.0  --   --   HGB 7.0* 8.0* 8.0* 7.8* 7.9*  HCT 20.4*  23.1* 23.1* 22.7* 22.6*  MCV 82.3 81.9 82.5 81.9 81.9  PLT 181 163 173 187 235   Basic Metabolic Panel:  Recent Labs Lab 06/15/17 1730 06/16/17 0435 06/17/17 0230 06/18/17 0247 06/19/17 0247  NA 134* 135 134* 135 135  K 3.9 3.7 3.9 3.8 3.9  CL 105 108 107 108 108  CO2 17* 17* 18* 18* 18*  GLUCOSE 118* 127* 101* 96 119*  BUN 123* 119* 111* 110* 112*  CREATININE 4.56* 4.39* 4.27* 4.19* 4.43*  CALCIUM 8.6* 8.4* 8.3* 8.3* 8.3*   GFR: Estimated Creatinine Clearance: 8.2 mL/min (A) (by C-G formula based on SCr of 4.43 mg/dL (H)). Liver Function Tests: No results for input(s): AST, ALT, ALKPHOS, BILITOT, PROT, ALBUMIN in the last 168 hours. No results for input(s): LIPASE, AMYLASE in the last 168 hours. No results for input(s): AMMONIA in the last 168 hours. Coagulation Profile: No results for input(s): INR, PROTIME in the last 168 hours. Cardiac Enzymes:  Recent Labs Lab 06/15/17 1730 06/16/17 0435 06/16/17 1211  TROPONINI 0.06* 0.15* 0.12*   BNP (last 3 results)  Recent Labs  08/20/16 1221 12/22/16 1453  PROBNP 373.0* 999.0*   HbA1C: No results for input(s): HGBA1C in the last 72 hours. CBG: No results for input(s): GLUCAP in the last 168 hours. Lipid Profile: No results for input(s): CHOL, HDL, LDLCALC, TRIG, CHOLHDL, LDLDIRECT in the last 72 hours. Thyroid Function Tests: No results for input(s): TSH, T4TOTAL, FREET4, T3FREE, THYROIDAB in the last 72 hours. Anemia Panel: No results for input(s): VITAMINB12, FOLATE, FERRITIN, TIBC, IRON, RETICCTPCT in the last 72 hours. Urine analysis:    Component Value Date/Time   COLORURINE YELLOW 06/07/2017 [place] 06/07/2017 1313   LABSPEC 1.013 06/07/2017 1313   PHURINE 5.0 06/07/2017 1313   GLUCOSEU NEGATIVE 06/07/2017 1313   HGBUR NEGATIVE 06/07/2017 1313   BILIRUBINUR NEGATIVE 06/07/2017 1313   KETONESUR NEGATIVE 06/07/2017 1313   PROTEINUR 100 (A) 06/07/2017 1313   NITRITE NEGATIVE  06/07/2017 1313   LEUKOCYTESUR NEGATIVE 06/07/2017 1313   Sepsis Labs: Invalid input(s): PROCALCITONIN, LACTICIDVEN  Recent Results (from the past 240 hour(s))  MRSA PCR Screening     Status: None   Collection Time: 06/16/17  1:38 PM  Result Value Ref Range Status   MRSA by PCR NEGATIVE NEGATIVE Final    Comment:        The GeneXpert MRSA Assay (FDA approved for NASAL specimens only), is one component of a comprehensive MRSA colonization surveillance program. It is not intended to diagnose MRSA infection nor to guide or monitor treatment for MRSA infections.     Radiology Studies: No results found. Scheduled Meds: . acidophilus  1 capsule Oral Daily  . aspirin EC  81 mg Oral BID  . cholecalciferol  5,000 Units Oral Daily  . diltiazem  300 mg Oral Daily  . doxazosin  1 mg Oral QPM  . ferrous sulfate  325 mg Oral Q breakfast  . isosorbide mononitrate  30 mg Oral Daily  . polyethylene glycol  17 g Oral Daily  . sodium chloride flush  3 mL Intravenous Q12H   Continuous Infusions: . sodium chloride      Marzetta Board, MD, PhD [facility] Pager [phone]  If 7PM-7AM, please contact night-coverage [email or web address] Password [email or web address] 06/19/2017, 12:34 PM

## 2017-06-19 NOTE — NC FL2 (Addendum)
[place] LEVEL OF CARE SCREENING TOOL     IDENTIFICATION  Patient Name: Johnny Morrow Birthdate: 1919-09-27 Sex: male Admission Date (Current Location): 06/15/2017  [facility] and [place] Number:  Herbalist and Address:  [facility] [place]. [facility], [place] [address], [place], [place] [address]      Provider Number: [id number]  Attending Physician Name and Address:  Caren Griffins, MD  Relative Name and Phone Number:       Current Level of Care: Hospital Recommended Level of Care: [place] Prior Approval Number:    Date Approved/Denied:   PASRR Number:   [id number]  Discharge Plan: SNF    Current Diagnoses: Patient Active Problem List   Diagnosis Date Noted  . Tachycardia 06/15/2017  . Retroperitoneal bleed 5/18 06/15/2017  . Chronic diastolic CHF (congestive heart failure) ([place]) 06/15/2017  . CKD (chronic kidney disease)   . Elevated troponin   . Closed intertrochanteric fracture of hip, right, initial encounter ([place]) 10 /18 06/07/2017  . Acute on chronic diastolic CHF (congestive heart failure) ([place]) 06/07/2017  . AF (paroxysmal atrial fibrillation) ([place]) 12/22/2016  . Pulmonary nodule less than 6 cm determined by computed tomography of lung 11/27/2016  . Normocytic anemia 11/27/2016  . Chronic kidney disease (CKD), stage IV (severe) ([place]) 08/20/2016  . Hyperlipidemia 08/20/2016  . Moderate tricuspid regurgitation 01/11/2016  . Mild aortic stenosis 01/11/2016  . Mild aortic regurgitation 01/11/2016  . Osteoporosis 01/11/2016  . Prediabetes 12/19/2015  . Essential hypertension, benign 09/25/2015  . History of prostate cancer 09/25/2015  . Idiopathic scoliosis, thoracic 09/25/2015  . History of DVT of lower extremity, left 09/25/2015    Orientation RESPIRATION BLADDER Height & Weight     Self, Time, Situation, Place  Normal Incontinent, External catheter Weight: 125 lb (56.7 kg) Height:  5\' 4"  (162.6 cm)   BEHAVIORAL SYMPTOMS/MOOD NEUROLOGICAL BOWEL NUTRITION STATUS      Continent Diet (see DC summary)  AMBULATORY STATUS COMMUNICATION OF NEEDS Skin   Extensive Assist Verbally Surgical wounds (right leg- gauze dressing PRN)                       Personal Care Assistance Level of Assistance  Bathing, Dressing Bathing Assistance: Maximum assistance   Dressing Assistance: Maximum assistance     Functional Limitations Info             SPECIAL CARE FACTORS FREQUENCY  PT (By licensed PT), OT (By licensed OT)     PT Frequency: 5/wk OT Frequency: 5/wk            Contractures      Additional Factors Info  Code Status, Allergies Code Status Info: FULL Allergies Info: Iodinated Diagnostic Agents, Ace Inhibitors, Shellfish-derived Products, Penicillins           Current Medications (06/19/2017):  This is the current hospital active medication list Current Facility-Administered Medications  Medication Dose Route Frequency Provider Last Rate Last Dose  . 0.9 %  sodium chloride infusion  250 mL Intravenous PRN Phillips Grout, MD      . acidophilus (RISAQUAD) capsule 1 capsule  1 capsule Oral Daily Phillips Grout, MD   1 capsule at 06/19/17 0835  . aspirin EC tablet 81 mg  81 mg Oral BID Phillips Grout, MD   81 mg at 06/19/17 0836  . bisacodyl (DULCOLAX) EC tablet 5 mg  5 mg Oral Daily PRN Phillips Grout, MD   5  mg at 06/18/17 2201  . cholecalciferol (VITAMIN D) tablet 5,000 Units  5,000 Units Oral Daily Phillips Grout, MD   5,000 Units at 06/19/17 [phone]  . diltiazem (CARDIZEM CD) 24 hr capsule 300 mg  300 mg Oral Daily Chanetta Marshall K, NP   300 mg at 06/19/17 0944  . doxazosin (CARDURA) tablet 1 mg  1 mg Oral QPM Derrill Kay A, MD   1 mg at 06/18/17 1853  . ferrous sulfate tablet 325 mg  325 mg Oral Q breakfast Phillips Grout, MD   325 mg at 06/19/17 0836  . isosorbide mononitrate (IMDUR) 24 hr tablet 30 mg  30 mg Oral Daily Nita Sells, MD   30 mg at 06/19/17  0836  . levalbuterol (XOPENEX) nebulizer solution 0.63 mg  0.63 mg Nebulization Q6H PRN Derrill Kay A, MD      . polyethylene glycol (MIRALAX / GLYCOLAX) packet 17 g  17 g Oral Daily Derrill Kay A, MD   17 g at 06/18/17 0908  . sodium chloride flush (NS) 0.9 % injection 3 mL  3 mL Intravenous Q12H Derrill Kay A, MD   3 mL at 06/19/17 0837  . sodium chloride flush (NS) 0.9 % injection 3 mL  3 mL Intravenous PRN Phillips Grout, MD      . traMADol Veatrice Bourbon) tablet 50 mg  50 mg Oral Q6H PRN Phillips Grout, MD   50 mg at 06/18/17 2200     Discharge Medications: Please see discharge summary for a list of discharge medications.  Relevant Imaging Results:  Relevant Lab Results:   Additional Information SS#: [id number]  Jorge Ny, LCSW

## 2017-06-19 NOTE — Consult Note (Signed)
   [facility] CM Inpatient Consult   06/19/2017  Johnny Morrow [birth year] [id number]  Patient assessed for re-admission in the Medicare ACO.  Chart review reveals patient a 83 year old from [facility] skilled facility with a recent closed hip fracture admitted with tachycardia HX of AF and Atrial Flutter per MD notes. Patient is to return to transition to skilled nursing facility for rehab. No current [facility] Care Management needs assessed.  For questions, please contact:  Natividad Brood, RN BSN [place] [facility] Liaison  [phone] business mobile phone Toll free office [phone]

## 2017-06-19 NOTE — Clinical Social Work Note (Signed)
Clinical Social Work Assessment  Patient Details  Name: Johnny Morrow MRN: [id number] Date of Birth: 03-31-1920  Date of referral:  06/19/17               Reason for consult:  Facility Placement (from [facility])                Permission sought to share information with:  Facility Sport and exercise psychologist, Family Supports Permission granted to share information::  Yes, Verbal Permission Granted  Name::     Johnny Morrow  Agency::  [name]  Relationship::  Wife  Contact Information:  [phone]  Housing/Transportation Living arrangements for the past 2 months:  [place], [place] of Information:  Patient, Adult Children, Spouse Patient Interpreter Needed:  None Criminal Activity/Legal Involvement Pertinent to Current Situation/Hospitalization:  No - Comment as needed Significant Relationships:  Adult Children, Spouse Lives with:  Facility Resident, Spouse Do you feel safe going back to the place where you live?  Yes Need for family participation in patient care:  No ([place])  Care giving concerns: Patient from [facility], but was in their SNF for a few days before readmission.  Social Worker assessment / plan: CSW met with patient, spouse, and daughter at bedside. Patient and family agreeable for patient to return to [facility]. Patient was living in [place] with spouse, but went to SNF after hip fracture. Pt only used a few Medicare days in SNF before return to hospital. Faxed FL2 to [name]. CSW to follow and support with discharge.   Employment status:  Retired Forensic scientist:  Medicare PT Recommendations:  [place] / Referral to community resources:  [place]  Patient/Family's Response to care: Patient and family appreciative of care.  Patient/Family's Understanding of and Emotional Response to Diagnosis, Current Treatment, and Prognosis: Patient and family with good  understanding of patient's condition and hopeful for return to SNF.   Emotional Assessment Appearance:  Appears stated age Attitude/Demeanor/Rapport:  Other (appropriate) Affect (typically observed):  Calm, Pleasant Orientation:  Oriented to Self, Oriented to Place, Oriented to  Time, Oriented to Situation Alcohol / Substance use:  Not Applicable Psych involvement (Current and /or in the community):  No (Comment)  Discharge Needs  Concerns to be addressed:  Discharge Planning Concerns Readmission within the last 30 days:  Yes Current discharge risk:  Physical Impairment Barriers to Discharge:  Continued Medical Work up   Estanislado Emms, LCSW 06/19/2017, 1:39 PM

## 2017-06-19 NOTE — Progress Notes (Addendum)
Electrophysiology Rounding Note  Patient Name: Johnny Morrow Date of Encounter: 06/19/2017  Primary Cardiologist: [facility]   Subjective   The patient is doing well today.  At this time, the patient denies chest pain, shortness of breath, or any new concerns.  Inpatient Medications    Scheduled Meds: . acidophilus  1 capsule Oral Daily  . aspirin EC  81 mg Oral BID  . cholecalciferol  5,000 Units Oral Daily  . diltiazem  60 mg Oral Q6H  . doxazosin  1 mg Oral QPM  . ferrous sulfate  325 mg Oral Q breakfast  . isosorbide mononitrate  30 mg Oral Daily  . polyethylene glycol  17 g Oral Daily  . sodium chloride flush  3 mL Intravenous Q12H   Continuous Infusions: . sodium chloride     PRN Meds: sodium chloride, bisacodyl, levalbuterol, sodium chloride flush, traMADol   Vital Signs    Vitals:   06/19/17 0000 06/19/17 0400 06/19/17 0510 06/19/17 0800  BP: 131/72 (!) 124/99 (!) 123/95 (!) 132/92  Pulse: 88 62  (!) 113  Resp: (!) 22 16  20   Temp: 98 F (36.7 C) 98 F (36.7 C)  98.3 F (36.8 C)  TempSrc: Oral Oral  Oral  SpO2: 98% 95%  95%  Weight:      Height:        Intake/Output Summary (Last 24 hours) at 06/19/17 0924 Last data filed at 06/19/17 0600  Gross per 24 hour  Intake              720 ml  Output             1050 ml  Net             -330 ml   Filed Weights   06/15/17 1709 06/18/17 0416  Weight: 125 lb (56.7 kg) 125 lb (56.7 kg)    Physical Exam    GEN- The patient is elderly appearing, alert and oriented x 3 today.   Head- normocephalic, atraumatic Eyes-  Sclera clear, conjunctiva pink Ears- hearing intact Oropharynx- clear Neck- supple Lungs- Clear to ausculation bilaterally, normal work of breathing Heart- Irregular rate and rhythm  GI- soft, NT, ND, + BS Extremities- no clubbing, cyanosis, or edema Skin- no rash or lesion Psych- euthymic mood, full affect Neuro- strength and sensation are intact  Labs    CBC  Recent Labs  06/17/17 0230 06/18/17 0247 06/19/17 0247  WBC 4.8 5.4 6.8  NEUTROABS 3.0  --   --   HGB 8.0* 7.8* 7.9*  HCT 23.1* 22.7* 22.6*  MCV 82.5 81.9 81.9  PLT 173 187 614   Basic Metabolic Panel  Recent Labs  06/18/17 0247 06/19/17 0247  NA 135 135  K 3.8 3.9  CL 108 108  CO2 18* 18*  GLUCOSE 96 119*  BUN 110* 112*  CREATININE 4.19* 4.43*  CALCIUM 8.3* 8.3*   Cardiac Enzymes  Recent Labs  06/16/17 1211  TROPONINI 0.12*    Telemetry    Atrial flutter with improved rate control (personally reviewed)  Radiology    No results found.  Assessment & Plan    1.  Atrial flutter with RVR Rates improved on Cardizem No significant bradycardia noted Pt remains asymptomatic Not felt to be a candidate for [facility]  Not a candidate for ablation Will consolidate cardizem dosing today  2.  Prior sinus bradycardia Asymptomatic  3.  CKD, stage IV Stable No change required today  Ok from EP  standpoint to discharge to SNF.  He should follow up with [facility] when able.  Electrophysiology team to see as needed while here. Please call with questions.  Signed, Chanetta Marshall, NP  06/19/2017, 9:24 AM   I have seen, examined the patient, and reviewed the above assessment and plan.  Changes to above are made where necessary.  On exam, iRRR.  V rates are improving.  Will increase diltiazem CD to 360mg  daily today.  Consider toprol XL 25mg  daily tomorrow if needed.  Our goal HR should be < 100 bpm for most of the time.  Not a candidate for anticoagulation.  No bradyarrhythmias here.  No indication for pacing at this time.  I have spoken with daughter at length today  General cardiology to follow over the weekend.  Co Sign: Thompson Grayer, MD 06/19/2017 3:09 PM

## 2017-06-20 DIAGNOSIS — I350 Nonrheumatic aortic (valve) stenosis: Secondary | ICD-10-CM

## 2017-06-20 DIAGNOSIS — I10 Essential (primary) hypertension: Secondary | ICD-10-CM

## 2017-06-20 DIAGNOSIS — I5032 Chronic diastolic (congestive) heart failure: Secondary | ICD-10-CM

## 2017-06-20 DIAGNOSIS — R58 Hemorrhage, not elsewhere classified: Secondary | ICD-10-CM

## 2017-06-20 MED ORDER — METOPROLOL TARTRATE 25 MG PO TABS
25.0000 mg | ORAL_TABLET | Freq: Two times a day (BID) | ORAL | Status: DC
  Administered 2017-06-20 – 2017-06-22 (×4): 25 mg via ORAL
  Filled 2017-06-20 (×3): qty 1

## 2017-06-20 NOTE — Progress Notes (Signed)
PROGRESS NOTE  Johnny Morrow FIE:[id number] DOB: 07-29-1920 DOA: 06/15/2017 PCP: Wenda Low, MD   LOS: 3 days   Brief Narrative / Interim history: 83 year old male with history of a flutter with intolerance to beta-blockers due to bradycardia, stage IV chronic kidney disease with a baseline creatinine of 4.0, prostate cancer status post prostatectomy and radiation therapy in 1993 chronic nonocclusive thrombus in the left popliteal vein, moderate aortic stenosis, severe retroperitoneal bleed in the setting of anticoagulation, chronic diastolic CHF, recent hip repair in October 2018, who was admitted to the hospital  from SNF with elevated heart rates into the 140s.  Assessment & Plan: Principal Problem:   Tachycardia Active Problems:   Essential hypertension, benign   History of DVT of lower extremity, left   Moderate tricuspid regurgitation   Mild aortic stenosis   Chronic kidney disease (CKD), stage IV (severe) (HCC)   AF (paroxysmal atrial fibrillation) (HCC)   Closed intertrochanteric fracture of hip, right, initial encounter (HCC) 10 /18   Retroperitoneal bleed 5/18   Chronic diastolic CHF (congestive heart failure) (HCC)   A flutter -Not on anticoagulation due to RP bleed in May 2018 -Reported history of bradycardia on beta-blockers.  He was started on diltiazem on 11/1, rates poorly controlled and the dose was increased on 11/2.  Still is rates vary overnight between 60-70 and 120 at rest.  At metoprolol today -cardiology/EP following -Daughter and patient's wife very concerned about patient going back to SNF while rate not better controlled, currently is in the 110-120s at rest.  They fear that SNF will send patient right back even though the patient is asymptomatic.   Troponin elevation -No chest pain, likely demand ischemia  Chronic kidney disease stage IV -Patient with baseline creatinine around 4.5, creatinine 4.8 on admission, recheck tomorrow morning  Anemia of  renal disease -Hemoglobin 8>>7.8>>7.9 this morning, overall stable   Recent hip repair -Status post repair by Dr. Doran Durand 10/21 right IM nail-needs hip precautions as per orthopedics -PT consulted, recommending SNF, working on rate control as above per cardiology so that he can participate in physical therapy at rehab center.  Chronic diastolic CHF -Last echo 16-60% in May 2017   DVT prophylaxis: SCDs Code Status: Full code Family Communication: No family at bedside this morning Disposition Plan: SNF when ready    Consultants:   Cardiology / EP  Procedures:   None   Antimicrobials:  None    Subjective: -No complaints this morning, denies any chest pain, denies any palpitations.  He is overall feeling well.  Objective: Vitals:   06/20/17 0512 06/20/17 0800 06/20/17 0943 06/20/17 1200  BP: 133/72 (!) 141/96 (!) 141/96 (!) 146/106  Pulse: 76 (!) 123  (!) 119  Resp: 16 (!) 21  16  Temp: 97.7 F (36.5 C)   98 F (36.7 C)  TempSrc: Oral   Oral  SpO2: 96% 95% 96% 97%  Weight:      Height:        Intake/Output Summary (Last 24 hours) at 06/20/17 1358 Last data filed at 06/20/17 0830  Gross per 24 hour  Intake              243 ml  Output              600 ml  Net             -357 ml   Filed Weights   06/15/17 1709 06/18/17 0416  Weight: 56.7 kg (125 lb) 56.7 kg (125  lb)    Examination:  Constitutional: NAD, calm, comfortable Eyes: lids and conjunctivae normal ENMT: Mucous membranes are moist.  Respiratory: clear to auscultation bilaterally, no wheezing, no crackles. Normal respiratory effort.  Cardiovascular: Irregular, no murmurs. Abdomen: no tenderness. Bowel sounds positive.  Skin: no rashes, lesions, ulcers. No induration  Data Reviewed: I have independently reviewed following labs and imaging studies   CBC:  Recent Labs Lab 06/15/17 1730 06/16/17 0435 06/17/17 0230 06/18/17 0247 06/19/17 0247  WBC 8.1 5.4 4.8 5.4 6.8  NEUTROABS 5.9  --  3.0   --   --   HGB 7.0* 8.0* 8.0* 7.8* 7.9*  HCT 20.4* 23.1* 23.1* 22.7* 22.6*  MCV 82.3 81.9 82.5 81.9 81.9  PLT 181 163 173 187 601   Basic Metabolic Panel:  Recent Labs Lab 06/15/17 1730 06/16/17 0435 06/17/17 0230 06/18/17 0247 06/19/17 0247  NA 134* 135 134* 135 135  K 3.9 3.7 3.9 3.8 3.9  CL 105 108 107 108 108  CO2 17* 17* 18* 18* 18*  GLUCOSE 118* 127* 101* 96 119*  BUN 123* 119* 111* 110* 112*  CREATININE 4.56* 4.39* 4.27* 4.19* 4.43*  CALCIUM 8.6* 8.4* 8.3* 8.3* 8.3*   GFR: Estimated Creatinine Clearance: 8.2 mL/min (A) (by C-G formula based on SCr of 4.43 mg/dL (H)). Liver Function Tests: No results for input(s): AST, ALT, ALKPHOS, BILITOT, PROT, ALBUMIN in the last 168 hours. No results for input(s): LIPASE, AMYLASE in the last 168 hours. No results for input(s): AMMONIA in the last 168 hours. Coagulation Profile: No results for input(s): INR, PROTIME in the last 168 hours. Cardiac Enzymes:  Recent Labs Lab 06/15/17 1730 06/16/17 0435 06/16/17 1211  TROPONINI 0.06* 0.15* 0.12*   BNP (last 3 results)  Recent Labs  08/20/16 1221 12/22/16 1453  PROBNP 373.0* 999.0*   HbA1C: No results for input(s): HGBA1C in the last 72 hours. CBG: No results for input(s): GLUCAP in the last 168 hours. Lipid Profile: No results for input(s): CHOL, HDL, LDLCALC, TRIG, CHOLHDL, LDLDIRECT in the last 72 hours. Thyroid Function Tests: No results for input(s): TSH, T4TOTAL, FREET4, T3FREE, THYROIDAB in the last 72 hours. Anemia Panel: No results for input(s): VITAMINB12, FOLATE, FERRITIN, TIBC, IRON, RETICCTPCT in the last 72 hours. Urine analysis:    Component Value Date/Time   COLORURINE YELLOW 06/07/2017 [place] 06/07/2017 1313   LABSPEC 1.013 06/07/2017 1313   PHURINE 5.0 06/07/2017 1313   GLUCOSEU NEGATIVE 06/07/2017 1313   HGBUR NEGATIVE 06/07/2017 1313   BILIRUBINUR NEGATIVE 06/07/2017 1313   KETONESUR NEGATIVE 06/07/2017 1313   PROTEINUR  100 (A) 06/07/2017 1313   NITRITE NEGATIVE 06/07/2017 1313   LEUKOCYTESUR NEGATIVE 06/07/2017 1313   Sepsis Labs: Invalid input(s): PROCALCITONIN, LACTICIDVEN  Recent Results (from the past 240 hour(s))  MRSA PCR Screening     Status: None   Collection Time: 06/16/17  1:38 PM  Result Value Ref Range Status   MRSA by PCR NEGATIVE NEGATIVE Final    Comment:        The GeneXpert MRSA Assay (FDA approved for NASAL specimens only), is one component of a comprehensive MRSA colonization surveillance program. It is not intended to diagnose MRSA infection nor to guide or monitor treatment for MRSA infections.     Radiology Studies: No results found. Scheduled Meds: . acidophilus  1 capsule Oral Daily  . aspirin EC  81 mg Oral BID  . cholecalciferol  5,000 Units Oral Daily  . diltiazem  360 mg Oral  Daily  . doxazosin  1 mg Oral QPM  . ferrous sulfate  325 mg Oral Q breakfast  . isosorbide mononitrate  30 mg Oral Daily  . metoprolol tartrate  25 mg Oral BID  . polyethylene glycol  17 g Oral Daily  . sodium chloride flush  3 mL Intravenous Q12H   Continuous Infusions: . sodium chloride      Marzetta Board, MD, PhD [facility] Pager [phone]  If 7PM-7AM, please contact night-coverage [email or web address] Password [email or web address] 06/20/2017, 1:58 PM

## 2017-06-20 NOTE — Progress Notes (Addendum)
Progress Note  Patient Name: Johnny Morrow Date of Encounter: 06/20/2017  Primary Cardiologist: [facility]  Subjective   Denies any CP, SOB, DOE, palpitations  Inpatient Medications    Scheduled Meds: . acidophilus  1 capsule Oral Daily  . aspirin EC  81 mg Oral BID  . cholecalciferol  5,000 Units Oral Daily  . diltiazem  360 mg Oral Daily  . doxazosin  1 mg Oral QPM  . ferrous sulfate  325 mg Oral Q breakfast  . isosorbide mononitrate  30 mg Oral Daily  . polyethylene glycol  17 g Oral Daily  . sodium chloride flush  3 mL Intravenous Q12H   Continuous Infusions: . sodium chloride     PRN Meds: sodium chloride, bisacodyl, levalbuterol, sodium chloride flush, traMADol   Vital Signs    Vitals:   06/19/17 2012 06/20/17 0512 06/20/17 0800 06/20/17 0943  BP: 137/68 133/72 (!) 141/96 (!) 141/96  Pulse: (!) 37 76 (!) 123   Resp: 20 16 (!) 21   Temp: 97.7 F (36.5 C) 97.7 F (36.5 C)    TempSrc: Oral Oral    SpO2: 98% 96% 95% 96%  Weight:      Height:        Intake/Output Summary (Last 24 hours) at 06/20/17 1243 Last data filed at 06/20/17 0830  Gross per 24 hour  Intake              243 ml  Output              600 ml  Net             -357 ml   Filed Weights   06/15/17 1709 06/18/17 0416  Weight: 125 lb (56.7 kg) 125 lb (56.7 kg)    Telemetry    Atrial flutter with RVR in the 120's- Personally Reviewed  ECG    No new EKG to review - Personally Reviewed  Physical Exam   GEN: No acute distress.   Neck: No JVD Cardiac: regular and tachycardic, no murmurs, rubs, or gallops.  Respiratory: Clear to auscultation bilaterally. GI: Soft, nontender, non-distended  MS: No edema; No deformity. Neuro:  Nonfocal  Psych: Normal affect   Labs    Chemistry Recent Labs Lab 06/17/17 0230 06/18/17 0247 06/19/17 0247  NA 134* 135 135  K 3.9 3.8 3.9  CL 107 108 108  CO2 18* 18* 18*  GLUCOSE 101* 96 119*  BUN 111* 110* 112*  CREATININE 4.27* 4.19* 4.43*   CALCIUM 8.3* 8.3* 8.3*  GFRNONAA 11* 11* 10*  GFRAA 12* 13* 12*  ANIONGAP 9 9 9      Hematology Recent Labs Lab 06/17/17 0230 06/18/17 0247 06/19/17 0247  WBC 4.8 5.4 6.8  RBC 2.80* 2.77* 2.76*  HGB 8.0* 7.8* 7.9*  HCT 23.1* 22.7* 22.6*  MCV 82.5 81.9 81.9  MCH 28.6 28.2 28.6  MCHC 34.6 34.4 35.0  RDW 13.9 13.8 14.1  PLT 173 187 207    Cardiac Enzymes Recent Labs Lab 06/15/17 1730 06/16/17 0435 06/16/17 1211  TROPONINI 0.06* 0.15* 0.12*   No results for input(s): TROPIPOC in the last 168 hours.   BNPNo results for input(s): BNP, PROBNP in the last 168 hours.   DDimer No results for input(s): DDIMER in the last 168 hours.   Radiology    No results found.  Cardiac Studies   none  Patient Profile     83 y.o. male with history of atrial flutter, mild-moderate AS and HFpEF.  Most recent echo 03/24/17 showed normal LVEF at 55%, tri leaflet AV with a mean gradient of 21 mmHg. There is no documented h/o CAD.  Assessment & Plan    1.  Atrial flutter with RVR Rates improved on Cardizem overnight but currently in the 120's No significant bradycardia noted Pt remains asymptomatic Not felt to be a candidate for [facility]  Not a candidate for ablation Continue Cardizem CD 360mg  daily Will add Lopressor 25mg  BID  today for better rate control  2.  Prior sinus bradycardia Asymptomatic  3.  CKD, stage IV Stable  4.  HTN - BP well controlled - continue CCB and adding BB for rate control  For questions or updates, please contact [place] Please consult [email or web address] for contact info under Cardiology/STEMI.      Signed, Fransico Him, MD  06/20/2017, 12:43 PM

## 2017-06-21 ENCOUNTER — Other Ambulatory Visit: Payer: Self-pay

## 2017-06-21 LAB — CBC
HEMATOCRIT: 22.6 % — AB (ref 39.0–52.0)
HEMOGLOBIN: 7.8 g/dL — AB (ref 13.0–17.0)
MCH: 28.6 pg (ref 26.0–34.0)
MCHC: 34.5 g/dL (ref 30.0–36.0)
MCV: 82.8 fL (ref 78.0–100.0)
Platelets: 192 10*3/uL (ref 150–400)
RBC: 2.73 MIL/uL — ABNORMAL LOW (ref 4.22–5.81)
RDW: 14.2 % (ref 11.5–15.5)
WBC: 5.9 10*3/uL (ref 4.0–10.5)

## 2017-06-21 LAB — BASIC METABOLIC PANEL
ANION GAP: 10 (ref 5–15)
BUN: 109 mg/dL — ABNORMAL HIGH (ref 6–20)
CALCIUM: 8.3 mg/dL — AB (ref 8.9–10.3)
CO2: 16 mmol/L — ABNORMAL LOW (ref 22–32)
Chloride: 106 mmol/L (ref 101–111)
Creatinine, Ser: 4.46 mg/dL — ABNORMAL HIGH (ref 0.61–1.24)
GFR calc Af Amer: 12 mL/min — ABNORMAL LOW (ref >60)
GFR, EST NON AFRICAN AMERICAN: 10 mL/min — AB (ref >60)
GLUCOSE: 121 mg/dL — AB (ref 65–99)
POTASSIUM: 4 mmol/L (ref 3.5–5.1)
SODIUM: 132 mmol/L — AB (ref 135–145)

## 2017-06-21 NOTE — Progress Notes (Signed)
PROGRESS NOTE  Johnny Morrow NWG:[id number] DOB: 1920-05-11 DOA: 06/15/2017 PCP: Wenda Low, MD   LOS: 4 days   Brief Narrative / Interim history: 83 year old male with history of a flutter with intolerance to beta-blockers due to bradycardia, stage IV chronic kidney disease with a baseline creatinine of 4.0, prostate cancer status post prostatectomy and radiation therapy in 1993 chronic nonocclusive thrombus in the left popliteal vein, moderate aortic stenosis, severe retroperitoneal bleed in the setting of anticoagulation, chronic diastolic CHF, recent hip repair in October 2018, who was admitted to the hospital  from SNF with elevated heart rates into the 140s.  Assessment & Plan: Principal Problem:   Tachycardia Active Problems:   Essential hypertension, benign   History of DVT of lower extremity, left   Moderate tricuspid regurgitation   Mild aortic stenosis   Chronic kidney disease (CKD), stage IV (severe) (HCC)   AF (paroxysmal atrial fibrillation) (HCC)   Closed intertrochanteric fracture of hip, right, initial encounter (HCC) 10 /18   Retroperitoneal bleed 5/18   Chronic diastolic CHF (congestive heart failure) (HCC)   A flutter -Not on anticoagulation due to RP bleed in May 2018 -Reported history of bradycardia on beta-blockers.  He was started on diltiazem on 11/1, rates poorly controlled and the dose was increased on 11/2.   -Yesterday metoprolol was added by cardiology.  Heart rates much better overnight, into the mid 50s low 60s while sleeping and no higher than 109 while moving around -Plan to return to SNF tomorrow if rates continue to stay stable  Troponin elevation -No chest pain, likely demand ischemia  Chronic kidney disease stage IV -Patient with baseline creatinine around 4.5, creatinine 4.8 on admission, creatinine of 4.4 for 2 days neuro, essentially stable.  Recommend recheck in 3-4 days at the SNF  Anemia of renal disease -Hemoglobin 8>>7.8>>7.9>>7.8,  essentially stable, recommend recheck in 3 4 days at SNF  Recent hip repair -Status post repair by Dr. Doran Durand 10/21 right IM nail-needs hip precautions as per orthopedics -PT consulted, recommending SNF, working on rate control as above per cardiology so that he can participate in physical therapy at rehab center.  Chronic diastolic CHF -Last echo 57-84% in May 2017   DVT prophylaxis: SCDs Code Status: Full code Family Communication: Discussed with daughter and wife at bedside Disposition Plan: SNF likely 1 day if heart rates controlled   Consultants:   Cardiology / EP  Procedures:   None   Antimicrobials:  None    Subjective: Remains without complaints  Objective: Vitals:   06/20/17 2009 06/20/17 2348 06/21/17 0441 06/21/17 1015  BP: 131/90 111/70 121/82 137/74  Pulse: 94 (!) 56 (!) 53 82  Resp: (!) 23 (!) 26 12   Temp: 97.9 F (36.6 C) (!) 97.4 F (36.3 C) 97.8 F (36.6 C)   TempSrc: Oral Oral Oral   SpO2:      Weight:      Height:        Intake/Output Summary (Last 24 hours) at 06/21/2017 1434 Last data filed at 06/21/2017 0444 Gross per 24 hour  Intake 360 ml  Output 300 ml  Net 60 ml   Filed Weights   06/15/17 1709 06/18/17 0416  Weight: 56.7 kg (125 lb) 56.7 kg (125 lb)    Examination:  Constitutional: NAD Eyes: No scleral icterus ENMT: Moist mucous membranes Respiratory: Clear to auscultation bilaterally, no wheezing Cardiovascular: Irregular, no murmurs Abdomen: No tenderness, positive bowel sounds Skin: No rashes  Data Reviewed: I have independently reviewed  following labs and imaging studies   CBC: Recent Labs  Lab 06/15/17 1730 06/16/17 0435 06/17/17 0230 06/18/17 0247 06/19/17 0247 06/21/17 0136  WBC 8.1 5.4 4.8 5.4 6.8 5.9  NEUTROABS 5.9  --  3.0  --   --   --   HGB 7.0* 8.0* 8.0* 7.8* 7.9* 7.8*  HCT 20.4* 23.1* 23.1* 22.7* 22.6* 22.6*  MCV 82.3 81.9 82.5 81.9 81.9 82.8  PLT 181 163 173 187 207 062   Basic Metabolic  Panel: Recent Labs  Lab 06/16/17 0435 06/17/17 0230 06/18/17 0247 06/19/17 0247 06/21/17 0136  NA 135 134* 135 135 132*  K 3.7 3.9 3.8 3.9 4.0  CL 108 107 108 108 106  CO2 17* 18* 18* 18* 16*  GLUCOSE 127* 101* 96 119* 121*  BUN 119* 111* 110* 112* 109*  CREATININE 4.39* 4.27* 4.19* 4.43* 4.46*  CALCIUM 8.4* 8.3* 8.3* 8.3* 8.3*   GFR: Estimated Creatinine Clearance: 8.1 mL/min (A) (by C-G formula based on SCr of 4.46 mg/dL (H)). Liver Function Tests: No results for input(s): AST, ALT, ALKPHOS, BILITOT, PROT, ALBUMIN in the last 168 hours. No results for input(s): LIPASE, AMYLASE in the last 168 hours. No results for input(s): AMMONIA in the last 168 hours. Coagulation Profile: No results for input(s): INR, PROTIME in the last 168 hours. Cardiac Enzymes: Recent Labs  Lab 06/15/17 1730 06/16/17 0435 06/16/17 1211  TROPONINI 0.06* 0.15* 0.12*   BNP (last 3 results) Recent Labs    08/20/16 1221 12/22/16 1453  PROBNP 373.0* 999.0*   HbA1C: No results for input(s): HGBA1C in the last 72 hours. CBG: No results for input(s): GLUCAP in the last 168 hours. Lipid Profile: No results for input(s): CHOL, HDL, LDLCALC, TRIG, CHOLHDL, LDLDIRECT in the last 72 hours. Thyroid Function Tests: No results for input(s): TSH, T4TOTAL, FREET4, T3FREE, THYROIDAB in the last 72 hours. Anemia Panel: No results for input(s): VITAMINB12, FOLATE, FERRITIN, TIBC, IRON, RETICCTPCT in the last 72 hours. Urine analysis:    Component Value Date/Time   COLORURINE YELLOW 06/07/2017 [place] 06/07/2017 1313   LABSPEC 1.013 06/07/2017 1313   PHURINE 5.0 06/07/2017 1313   GLUCOSEU NEGATIVE 06/07/2017 1313   HGBUR NEGATIVE 06/07/2017 1313   BILIRUBINUR NEGATIVE 06/07/2017 1313   KETONESUR NEGATIVE 06/07/2017 1313   PROTEINUR 100 (A) 06/07/2017 1313   NITRITE NEGATIVE 06/07/2017 1313   LEUKOCYTESUR NEGATIVE 06/07/2017 1313   Sepsis Labs: Invalid input(s): PROCALCITONIN,  LACTICIDVEN  Recent Results (from the past 240 hour(s))  MRSA PCR Screening     Status: None   Collection Time: 06/16/17  1:38 PM  Result Value Ref Range Status   MRSA by PCR NEGATIVE NEGATIVE Final    Comment:        The GeneXpert MRSA Assay (FDA approved for NASAL specimens only), is one component of a comprehensive MRSA colonization surveillance program. It is not intended to diagnose MRSA infection nor to guide or monitor treatment for MRSA infections.     Radiology Studies: No results found. Scheduled Meds: . acidophilus  1 capsule Oral Daily  . aspirin EC  81 mg Oral BID  . cholecalciferol  5,000 Units Oral Daily  . diltiazem  360 mg Oral Daily  . doxazosin  1 mg Oral QPM  . ferrous sulfate  325 mg Oral Q breakfast  . isosorbide mononitrate  30 mg Oral Daily  . metoprolol tartrate  25 mg Oral BID  . polyethylene glycol  17 g Oral Daily  .  sodium chloride flush  3 mL Intravenous Q12H   Continuous Infusions: . sodium chloride      Marzetta Board, MD, PhD [facility] Pager [phone]  If 7PM-7AM, please contact night-coverage [email or web address] Password [facility] 06/21/2017, 2:34 PM

## 2017-06-21 NOTE — Progress Notes (Signed)
Progress Note  Patient Name: Johnny Morrow Date of Encounter: 06/21/2017  Primary Cardiologist: [facility]  Subjective   Denies any chest pain or SOB, DOE, palpitations  Inpatient Medications    Scheduled Meds: . acidophilus  1 capsule Oral Daily  . aspirin EC  81 mg Oral BID  . cholecalciferol  5,000 Units Oral Daily  . diltiazem  360 mg Oral Daily  . doxazosin  1 mg Oral QPM  . ferrous sulfate  325 mg Oral Q breakfast  . isosorbide mononitrate  30 mg Oral Daily  . metoprolol tartrate  25 mg Oral BID  . polyethylene glycol  17 g Oral Daily  . sodium chloride flush  3 mL Intravenous Q12H   Continuous Infusions: . sodium chloride     PRN Meds: sodium chloride, bisacodyl, levalbuterol, sodium chloride flush, traMADol   Vital Signs    Vitals:   06/20/17 2009 06/20/17 2348 06/21/17 0441 06/21/17 1015  BP: 131/90 111/70 121/82 137/74  Pulse: 94 (!) 56 (!) 53 82  Resp: (!) 23 (!) 26 12   Temp: 97.9 F (36.6 C) (!) 97.4 F (36.3 C) 97.8 F (36.6 C)   TempSrc: Oral Oral Oral   SpO2:      Weight:      Height:        Intake/Output Summary (Last 24 hours) at 06/21/2017 1222 Last data filed at 06/21/2017 0444 Gross per 24 hour  Intake 600 ml  Output 450 ml  Net 150 ml   Filed Weights   06/15/17 1709 06/18/17 0416  Weight: 125 lb (56.7 kg) 125 lb (56.7 kg)    Telemetry     atrial flutter with variable block with CVR- Personally Reviewed  ECG    No new EKG - Personally Reviewed  Physical Exam   GEN: No acute distress.   Neck: No JVD Cardiac: irregularly irregular, no murmurs, rubs, or gallops.  Respiratory: Clear to auscultation bilaterally. GI: Soft, nontender, non-distended  MS: No edema; No deformity. Neuro:  Nonfocal  Psych: Normal affect   Labs    Chemistry Recent Labs  Lab 06/18/17 0247 06/19/17 0247 06/21/17 0136  NA 135 135 132*  K 3.8 3.9 4.0  CL 108 108 106  CO2 18* 18* 16*  GLUCOSE 96 119* 121*  BUN 110* 112* 109*    CREATININE 4.19* 4.43* 4.46*  CALCIUM 8.3* 8.3* 8.3*  GFRNONAA 11* 10* 10*  GFRAA 13* 12* 12*  ANIONGAP 9 9 10      Hematology Recent Labs  Lab 06/18/17 0247 06/19/17 0247 06/21/17 0136  WBC 5.4 6.8 5.9  RBC 2.77* 2.76* 2.73*  HGB 7.8* 7.9* 7.8*  HCT 22.7* 22.6* 22.6*  MCV 81.9 81.9 82.8  MCH 28.2 28.6 28.6  MCHC 34.4 35.0 34.5  RDW 13.8 14.1 14.2  PLT 187 207 192    Cardiac Enzymes Recent Labs  Lab 06/15/17 1730 06/16/17 0435 06/16/17 1211  TROPONINI 0.06* 0.15* 0.12*   No results for input(s): TROPIPOC in the last 168 hours.   BNPNo results for input(s): BNP, PROBNP in the last 168 hours.   DDimer No results for input(s): DDIMER in the last 168 hours.   Radiology    No results found.  Cardiac Studies   none  Patient Profile     83 y.o. male with history of atrial flutter, mild-moderate AS and HFpEF. Most recent echo 03/24/17 showed normal LVEF at 55%, tri leaflet AV with a mean gradient of 21 mmHg. There is no documented  h/o CAD.  Assessment & Plan    1. Atrial flutter with RVR Rates much improved after adding BB Pt remains asymptomatic Not felt to be a candidate for [facility]  Not a candidate for ablation Continue Cardizem CD 360mg  daily and Lopressor 25mg  BID    2. Prior sinus bradycardia Asymptomatic - HR only in mid 50's while asleep  3. CKD, stage IV Stable - per TRH  4.  HTN - BP well controlled with BP 137/18mmHg today - continue CCB and BB    For questions or updates, please contact [place] Please consult [email or web address] for contact info under Cardiology/STEMI.      Signed, Fransico Him, MD  06/21/2017, 12:22 PM

## 2017-06-21 NOTE — Progress Notes (Signed)
Pt up to chair. Call bell and phone within reach. Will continue to monitor.

## 2017-06-22 DIAGNOSIS — R748 Abnormal levels of other serum enzymes: Secondary | ICD-10-CM

## 2017-06-22 LAB — CBC
HEMATOCRIT: 23.7 % — AB (ref 39.0–52.0)
Hemoglobin: 8.1 g/dL — ABNORMAL LOW (ref 13.0–17.0)
MCH: 28 pg (ref 26.0–34.0)
MCHC: 34.2 g/dL (ref 30.0–36.0)
MCV: 82 fL (ref 78.0–100.0)
Platelets: 209 10*3/uL (ref 150–400)
RBC: 2.89 MIL/uL — ABNORMAL LOW (ref 4.22–5.81)
RDW: 14.1 % (ref 11.5–15.5)
WBC: 6.1 10*3/uL (ref 4.0–10.5)

## 2017-06-22 LAB — BASIC METABOLIC PANEL
Anion gap: 7 (ref 5–15)
BUN: 110 mg/dL — AB (ref 6–20)
CHLORIDE: 107 mmol/L (ref 101–111)
CO2: 18 mmol/L — AB (ref 22–32)
CREATININE: 4.55 mg/dL — AB (ref 0.61–1.24)
Calcium: 8.3 mg/dL — ABNORMAL LOW (ref 8.9–10.3)
GFR calc Af Amer: 12 mL/min — ABNORMAL LOW (ref >60)
GFR calc non Af Amer: 10 mL/min — ABNORMAL LOW (ref >60)
GLUCOSE: 116 mg/dL — AB (ref 65–99)
POTASSIUM: 4.2 mmol/L (ref 3.5–5.1)
SODIUM: 132 mmol/L — AB (ref 135–145)

## 2017-06-22 MED ORDER — METOPROLOL TARTRATE 12.5 MG HALF TABLET: 12.5000 mg | ORAL_TABLET | Freq: Two times a day (BID) | ORAL | Status: DC

## 2017-06-22 MED ORDER — ISOSORBIDE MONONITRATE ER 60 MG PO TB24: 30.0000 mg | ORAL_TABLET | Freq: Every day | ORAL | Status: AC

## 2017-06-22 MED ORDER — DILTIAZEM HCL ER COATED BEADS 360 MG PO CP24: 360.0000 mg | ORAL_CAPSULE | Freq: Every day | ORAL | Status: DC

## 2017-06-22 MED ORDER — TRAMADOL HCL 50 MG PO TABS: 50.0000 mg | ORAL_TABLET | Freq: Four times a day (QID) | ORAL | 0 refills | Status: DC | PRN

## 2017-06-22 MED ORDER — METOPROLOL TARTRATE 25 MG PO TABS: 12.5000 mg | ORAL_TABLET | Freq: Two times a day (BID) | ORAL | Status: DC

## 2017-06-22 NOTE — Progress Notes (Signed)
Physical Therapy Treatment Patient Details Name: Johnny Morrow MRN: [id number] DOB: [birth year] Today's Date: 06/22/2017    History of Present Illness Pt is a 83 y/o male admitted from SNF secondary to tachycardia. Pt with recent IM nail R hip fx on 06/07/17 and d/c'ed to SNF at [place]. PMH including but not limited to a-fib, HTN, CHF and CKD.    PT Comments    Pt making steady progress. HR remained 106 or less with ambulation. Ready for return to SNF from PT standpoint.  Follow Up Recommendations  SNF     Equipment Recommendations  None recommended by PT    Recommendations for Other Services       Precautions / Restrictions Precautions Precautions: Fall Precaution Comments: watch HR Restrictions Weight Bearing Restrictions: No RLE Weight Bearing: Weight bearing as tolerated    Mobility  Bed Mobility Overal bed mobility: Needs Assistance Bed Mobility: Supine to Sit     Supine to sit: Min assist     General bed mobility comments: Assist to move RLE off of bed and to elevate trunk into sitting. Incr time.  Transfers Overall transfer level: Needs assistance Equipment used: Rolling walker (2 wheeled) Transfers: Sit to/from Stand Sit to Stand: Min assist;Mod assist         General transfer comment: Assist to bring hips up and verbal cues for hand placment. Min assist from bed and mod assist from lower chair.  Ambulation/Gait Ambulation/Gait assistance: Min assist Ambulation Distance (Feet): 12 Feet(x 2) Assistive device: Rolling walker (2 wheeled) Gait Pattern/deviations: Step-to pattern;Decreased step length - right;Decreased step length - left;Shuffle;Trunk flexed Gait velocity: decr Gait velocity interpretation: <1.8 ft/sec, indicative of risk for recurrent falls General Gait Details: Assist for balance and support. Pt with heavy reliance on UE's. Takes a brief rest break every 3-4 feet. Verbal cues to not step too far forward and place feet in front of  crossbar of walker. Verbal cues to stand more erect. RHR 75. HR with amb 106   Stairs            Wheelchair Mobility    Modified Rankin (Stroke Patients Only)       Balance Overall balance assessment: Needs assistance Sitting-balance support: Feet supported;No upper extremity supported Sitting balance-Leahy Scale: Fair     Standing balance support: Bilateral upper extremity supported Standing balance-Leahy Scale: Poor Standing balance comment: walker and min assist for static standing                            Cognition Arousal/Alertness: Awake/alert Behavior During Therapy: WFL for tasks assessed/performed Overall Cognitive Status: Within Functional Limits for tasks assessed                                        Exercises      General Comments        Pertinent Vitals/Pain Pain Assessment: No/denies pain    Home Living                      Prior Function            PT Goals (current goals can now be found in the care plan section) Progress towards PT goals: Progressing toward goals    Frequency    Min 2X/week      PT Plan Current plan remains appropriate  Co-evaluation              AM-PAC PT "6 Clicks" Daily Activity  Outcome Measure  Difficulty turning over in bed (including adjusting bedclothes, sheets and blankets)?: Unable Difficulty moving from lying on back to sitting on the side of the bed? : Unable Difficulty sitting down on and standing up from a chair with arms (e.g., wheelchair, bedside commode, etc,.)?: Unable Help needed moving to and from a bed to chair (including a wheelchair)?: A Lot Help needed walking in hospital room?: A Lot Help needed climbing 3-5 steps with a railing? : Total 6 Click Score: 8    End of Session Equipment Utilized During Treatment: Gait belt Activity Tolerance: Patient limited by fatigue Patient left: with call bell/phone within reach;in chair;with chair  alarm set Nurse Communication: Mobility status PT Visit Diagnosis: Other abnormalities of gait and mobility (R26.89);Unsteadiness on feet (R26.81);Muscle weakness (generalized) (M62.81)     Time: 4128-7867 PT Time Calculation (min) (ACUTE ONLY): 34 min  Charges:  $Gait Training: 23-37 mins                    G Codes:       [facility] PT [place] 06/22/2017, 10:20 AM

## 2017-06-22 NOTE — Discharge Summary (Signed)
Physician Discharge Summary  Johnny Morrow JGG:[id number] DOB: 1920-06-24 DOA: 06/15/2017  PCP: Wenda Low, MD  Admit date: 06/15/2017 Discharge date: 06/22/2017  Admitted From: SNF Disposition:  SNF  Recommendations for Outpatient Follow-up:  1. Follow up with Dr. Doran Durand with Orthopedic surgery in 1 week 2. Patient started on Diltiazem and Metoprolol as below 3. Please obtain BMP/CBC in one week  Home Health: none Equipment/Devices: none  Discharge Condition: stable CODE STATUS: Full code Diet recommendation: heart healthy  HPI: Per Dr. Sharyn Lull Sciarra is a 83 y.o. male with medical history significant of recent hip repair last week, afib, DVT, multiple blood transfusions, retroperitoneal bleed (wife thinks in may 2018) which is reason why he is not anticoagulated, ckd comes in for tachycardia all weekend.  He has not been running fevers.  He was just d/c 3 days ago.  Has been doing well with rehab.  No weakness.  No n/v/d.  No sob or chest pain.  C/o hip pain which is mild.  There was no doctor to see him at rehab this weekend and wife upset that his heart rate has been high all weekend without being evaluated so she insisted he come to ED today.  She does not know how high.  Pt referred for admission to r/o PE.  His rate was high around 140s now in the 90s with only a small ivf bolus.  Pt has no complaints.  They are really unsure when he had this bleed.  Hospital Course: Discharge Diagnoses:  Principal Problem:   Tachycardia Active Problems:   Essential hypertension, benign   History of DVT of lower extremity, left   Moderate tricuspid regurgitation   Mild aortic stenosis   Chronic kidney disease (CKD), stage IV (severe) (HCC)   AF (paroxysmal atrial fibrillation) (HCC)   Closed intertrochanteric fracture of hip, right, initial encounter ([place]) 10 /18   Retroperitoneal bleed 5/18   Chronic diastolic CHF (congestive heart failure) ([place])   A flutter -patient was admitted  to the hospital with A flutter with rapid ventricular rates. He is deemed not on anticoagulation due to RP bleed in May 2018. Cardiology consulted and has followed patient while hospitalized. He was started on diltiazem and metoprolol successively with improvement in his heart rate, now mid50-mid60s at rest and 100s when working with PT. Cardiology cleared patient for discharge Troponin elevation -No chest pain, likely demand ischemia Chronic kidney disease stage IV -Patient with baseline creatinine around 4.5, stable Anemia of renal disease -Hemoglobin 8>>7.8>>7.9 stable Recent hip repair -Status post repair by Dr. Doran Durand 10/21 right IM nail-needs hip precautions as per orthopedics. D/w on call orthopedics for Dr. Doran Durand (Dr. Alma Friendly) on the day of discharge, can follow as an outpatient in 1 week. Wound clean, no erythem, no drainage Chronic diastolic CHF -Last echo 54-65% in May 2017, stable, Lasix prn  Discharge Instructions   Allergies as of 06/22/2017      Reactions   Iodinated Diagnostic Agents Rash   Ace Inhibitors Other (See Comments)   Noted (on MAR) to be "allergic," but no reaction was cited/noted   [employer] Other (See Comments)   Noted (on MAR) to be "allergic," but no reaction was cited/noted   Penicillins Rash   Has patient had a PCN reaction causing immediate rash, facial/tongue/throat swelling, SOB or lightheadedness with hypotension: Yes Has patient had a PCN reaction causing severe rash involving mucus membranes or skin necrosis: Unknown Has patient had a PCN reaction that required hospitalization: Unknown Has  patient had a PCN reaction occurring within the last 10 years: Unknown If all of the above answers are "NO", then may proceed with Cephalosporin use.      Medication List    STOP taking these medications   amLODipine 10 MG tablet Commonly known as:  NORVASC   hydrALAZINE 50 MG tablet Commonly known as:  APRESOLINE     TAKE these medications     albuterol 108 (90 Base) MCG/ACT inhaler Commonly known as:  PROVENTIL HFA;VENTOLIN HFA Inhale 2 puffs into the lungs every 6 (six) hours as needed for wheezing or shortness of breath.   aspirin EC 81 MG tablet Take 1 tablet (81 mg total) by mouth 2 (two) times daily.   bisacodyl 5 MG EC tablet Commonly known as:  DULCOLAX Take 1 tablet (5 mg total) by mouth daily as needed for moderate constipation.   Co Q 10 100 MG Caps Take 100 mg by mouth daily.   diltiazem 360 MG 24 hr capsule Commonly known as:  CARDIZEM CD Take 1 capsule (360 mg total) daily by mouth. Start taking on:  06/23/2017   doxazosin 1 MG tablet Commonly known as:  CARDURA Take 1 mg by mouth every evening.   ferrous sulfate 325 (65 FE) MG tablet Take 1 tablet (325 mg total) by mouth daily with breakfast.   furosemide 40 MG tablet Commonly known as:  LASIX Take 40 mg by mouth daily as needed (IF A WEIGHT GAIN OF 2 POUNDS OR MORE OCCURS).   isosorbide mononitrate 60 MG 24 hr tablet Commonly known as:  IMDUR Take 0.5 tablets (30 mg total) daily by mouth. What changed:  how much to take   Lumbar Back Brace/Support Pad Misc Use as directed for lumbar scoliosis   metoprolol tartrate 25 MG tablet Commonly known as:  LOPRESSOR Take 0.5 tablets (12.5 mg total) 2 (two) times daily by mouth.   MULTI VITAMIN PO Take 1 tablet by mouth daily.   Omega 3 1000 MG Caps Take 2,000 mg by mouth daily.   polyethylene glycol packet Commonly known as:  MIRALAX / GLYCOLAX Take 17 g by mouth daily.   PROBIOTIC PO Chew 1 tablet (250 million cell) by mouth once a day   senna 8.6 MG Tabs tablet Commonly known as:  SENOKOT Take 1 tablet by mouth every morning.   traMADol 50 MG tablet Commonly known as:  ULTRAM Take 1 tablet (50 mg total) every 6 (six) hours as needed by mouth for moderate pain.   Vitamin D3 5000 units Tabs Take 5,000 Units by mouth daily.   VITAMIN-B COMPLEX PO Take 1 tablet by mouth daily.       Contact information for after-discharge care    Destination    [facility] .   Service:  Skilled Nursing Contact information: [place] [phone]              Consultations:  Cardiology   Procedures/Studies:  Dg Chest 1 View  Result Date: 06/07/2017 CLINICAL DATA:  Acute right hip fracture. Pre-op respiratory exam. Prostate carcinoma. EXAM: CHEST 1 VIEW COMPARISON:  09/30/2016 FINDINGS: Heart size is stable.  Severe thoracolumbar dextroscoliosis. Diffuse nodular pulmonary interstitial prominence is new since previous study. This is nonspecific, and may be due to pulmonary edema, viral pneumonitis, or possibly metastatic disease. No evidence of pneumothorax or pleural effusion. IMPRESSION: New diffuse nodular pulmonary interstitial prominence, which is nonspecific. Differential considerations include pulmonary edema, viral pneumonitis, or  possibly metastatic disease. Recommend clinical correlation, and consider continued chest radiographic followup versus chest CT. Electronically Signed   By: Earle Gell M.D.   On: 06/07/2017 11:49   Ct Head Wo Contrast  Result Date: 06/07/2017 CLINICAL DATA:  Recent fall, trauma, high risk for injury EXAM: CT HEAD WITHOUT CONTRAST CT CERVICAL SPINE WITHOUT CONTRAST TECHNIQUE: Multidetector CT imaging of the head and cervical spine was performed following the standard protocol without intravenous contrast. Multiplanar CT image reconstructions of the cervical spine were also generated. COMPARISON:  None available FINDINGS: CT HEAD FINDINGS Brain: Brain atrophy evident with advanced chronic white matter microvascular ischemic changes throughout both cerebral hemispheres. No acute intracranial hemorrhage, mass lesion, definite new infarction, midline shift, herniation, hydrocephalus, or extra-axial fluid collection. No focal mass effect or edema. Cisterns are patent.  Cerebellar atrophy as well. Vascular: Intracranial atherosclerosis noted.  No hyperdense vessel. Skull: No depressed skull fracture.  Left mastoid effusion noted. Sinuses/Orbits: Chronic scattered sinus mucosal thickening. Orbits are unremarkable and symmetric. Other: None. CT CERVICAL SPINE FINDINGS Alignment: Normal alignment. No subluxation or dislocation. Facets are aligned. Multilevel facet arthropathy noted. Skull base and vertebrae: Bones are osteopenic. No acute osseous finding or fracture. Soft tissues and spinal canal: No prevertebral fluid or swelling. No visible canal hematoma. Carotid atherosclerosis evident. Disc levels: Multilevel degenerative spondylosis noted with disc space narrowing, sclerosis and endplate osteophytes spanning C2-C7 at all levels. Facet arthropathy most pronounced at C4-5 on the left. Upper chest: No acute finding.  Apical scarring. Other: None. IMPRESSION: Brain atrophy and chronic white matter microvascular changes. No acute intracranial abnormality by noncontrast CT Nonspecific left mastoid effusion Minor chronic sinus disease Cervical degenerative spondylosis and osteopenia without acute osseous finding, fracture or malalignment by CT. Electronically Signed   By: Jerilynn Mages.  Shick M.D.   On: 06/07/2017 11:35   Ct Cervical Spine Wo Contrast  Result Date: 06/07/2017 CLINICAL DATA:  Recent fall, trauma, high risk for injury EXAM: CT HEAD WITHOUT CONTRAST CT CERVICAL SPINE WITHOUT CONTRAST TECHNIQUE: Multidetector CT imaging of the head and cervical spine was performed following the standard protocol without intravenous contrast. Multiplanar CT image reconstructions of the cervical spine were also generated. COMPARISON:  None available FINDINGS: CT HEAD FINDINGS Brain: Brain atrophy evident with advanced chronic white matter microvascular ischemic changes throughout both cerebral hemispheres. No acute intracranial hemorrhage, mass lesion, definite new infarction, midline shift,  herniation, hydrocephalus, or extra-axial fluid collection. No focal mass effect or edema. Cisterns are patent. Cerebellar atrophy as well. Vascular: Intracranial atherosclerosis noted.  No hyperdense vessel. Skull: No depressed skull fracture.  Left mastoid effusion noted. Sinuses/Orbits: Chronic scattered sinus mucosal thickening. Orbits are unremarkable and symmetric. Other: None. CT CERVICAL SPINE FINDINGS Alignment: Normal alignment. No subluxation or dislocation. Facets are aligned. Multilevel facet arthropathy noted. Skull base and vertebrae: Bones are osteopenic. No acute osseous finding or fracture. Soft tissues and spinal canal: No prevertebral fluid or swelling. No visible canal hematoma. Carotid atherosclerosis evident. Disc levels: Multilevel degenerative spondylosis noted with disc space narrowing, sclerosis and endplate osteophytes spanning C2-C7 at all levels. Facet arthropathy most pronounced at C4-5 on the left. Upper chest: No acute finding.  Apical scarring. Other: None. IMPRESSION: Brain atrophy and chronic white matter microvascular changes. No acute intracranial abnormality by noncontrast CT Nonspecific left mastoid effusion Minor chronic sinus disease Cervical degenerative spondylosis and osteopenia without acute osseous finding, fracture or malalignment by CT. Electronically Signed   By: Jerilynn Mages.  Shick M.D.   On: 06/07/2017 11:35  Nm Pulmonary Vent And Perf (v/q Scan)  Result Date: 06/15/2017 CLINICAL DATA:  Tachycardia and weakness. Recent hip surgery. PE suspected, high pretest prob EXAM: NUCLEAR MEDICINE VENTILATION - PERFUSION LUNG SCAN TECHNIQUE: Ventilation images were obtained in multiple projections using inhaled aerosol Tc-21m DTPA. Perfusion images were obtained in multiple projections after intravenous injection of Tc-96m MAA. RADIOPHARMACEUTICALS:  31.6 mCi Technetium-74m DTPA aerosol inhalation and 4.40 mCi Technetium-67m MAA IV COMPARISON:  Chest radiograph earlier this day  FINDINGS: Ventilation: Diffusely heterogeneous, no focal ventilation defect. Perfusion: No wedge shaped peripheral perfusion defects to suggest acute pulmonary embolism. Enlarged cardiac silhouette is noted. Perfusion more homogeneous on ventilation. IMPRESSION: 1. Low probability for pulmonary embolus. 2. Profusion is more homogeneous than ventilation, which can be seen with airways disease. Electronically Signed   By: Jeb Levering M.D.   On: 06/15/2017 23:22   Dg Chest Portable 1 View  Result Date: 06/15/2017 CLINICAL DATA:  Tachycardia EXAM: PORTABLE CHEST 1 VIEW COMPARISON:  06/07/2017 FINDINGS: Prior interstitial prominence has improved/resolved, suggesting resolution of interstitial edema. Mild patchy bilateral lower lobe opacities, likely atelectasis. No definite pleural effusions. No pneumothorax. Cardiomegaly. Midthoracic dextroscoliosis. IMPRESSION: Prior interstitial edema has resolved. Mild patchy bilateral lower lobe opacities, likely atelectasis. Electronically Signed   By: Julian Hy M.D.   On: 06/15/2017 18:06   Dg C-arm 1-60 Min  Result Date: 06/07/2017 CLINICAL DATA:  Right hip fracture fixation EXAM: DG C-ARM 61-120 MIN; OPERATIVE RIGHT HIP WITH PELVIS COMPARISON:  None. FINDINGS: There are 4 C-arm fluoroscopic views provided from a right femoral nail fixation of a comminuted intertrochanteric fracture of the proximal femur with avulsed lesser trochanter. Less varus angulation is currently identified on these images. No evidence of hardware failure or immediate postoperative fracture. Improved alignment is demonstrated though there is still some cephalad displacement of the femoral shaft relative to the femoral neck. IMPRESSION: Status post nail fixation of intertrochanteric comminuted fracture the right femur. Electronically Signed   By: Ashley Royalty M.D.   On: 06/07/2017 23:13   Dg Hip Operative Unilat W Or W/o Pelvis Right  Result Date: 06/07/2017 CLINICAL DATA:  Right  hip fracture fixation EXAM: DG C-ARM 61-120 MIN; OPERATIVE RIGHT HIP WITH PELVIS COMPARISON:  None. FINDINGS: There are 4 C-arm fluoroscopic views provided from a right femoral nail fixation of a comminuted intertrochanteric fracture of the proximal femur with avulsed lesser trochanter. Less varus angulation is currently identified on these images. No evidence of hardware failure or immediate postoperative fracture. Improved alignment is demonstrated though there is still some cephalad displacement of the femoral shaft relative to the femoral neck. IMPRESSION: Status post nail fixation of intertrochanteric comminuted fracture the right femur. Electronically Signed   By: Ashley Royalty M.D.   On: 06/07/2017 23:13   Dg Hip Unilat  With Pelvis 2-3 Views Right  Result Date: 06/07/2017 CLINICAL DATA:  Followup getting out of bed this morning. Right hip pain. Initial encounter. EXAM: DG HIP (WITH OR WITHOUT PELVIS) 2-3V RIGHT COMPARISON:  None. FINDINGS: Comminuted intertrochanteric fracture of the right hip is seen. No evidence of dislocation. No pelvic fracture identified. Generalized osteopenia noted. Bilateral pelvic surgical clips are seen from previous lymphadenectomy. Peripheral vascular calcification noted in right thigh. IMPRESSION: Comminuted intertrochanteric right hip fracture. Electronically Signed   By: Earle Gell M.D.   On: 06/07/2017 11:43      Subjective: - no chest pain, shortness of breath, no abdominal pain, nausea or vomiting.   Discharge Exam: Vitals:   06/21/17 2025  06/22/17 0400  BP: 118/73 127/84  Pulse: (!) 57   Resp: (!) 21 16  Temp: 97.9 F (36.6 C) 98.1 F (36.7 C)  SpO2: 94% 97%    General: Pt is alert, awake, not in acute distress Cardiovascular: irregular, no rubs, no gallops Respiratory: CTA bilaterally, no wheezing, no rhonchi Abdominal: Soft, NT, ND, bowel sounds + Extremities: no edema, no cyanosis    The results of significant diagnostics from this  hospitalization (including imaging, microbiology, ancillary and laboratory) are listed below for reference.     Microbiology: Recent Results (from the past 240 hour(s))  MRSA PCR Screening     Status: None   Collection Time: 06/16/17  1:38 PM  Result Value Ref Range Status   MRSA by PCR NEGATIVE NEGATIVE Final    Comment:        The GeneXpert MRSA Assay (FDA approved for NASAL specimens only), is one component of a comprehensive MRSA colonization surveillance program. It is not intended to diagnose MRSA infection nor to guide or monitor treatment for MRSA infections.      Labs: BNP (last 3 results) Recent Labs    06/07/17 1022  BNP 245.8*   Basic Metabolic Panel: Recent Labs  Lab 06/17/17 0230 06/18/17 0247 06/19/17 0247 06/21/17 0136 06/22/17 0240  NA 134* 135 135 132* 132*  K 3.9 3.8 3.9 4.0 4.2  CL 107 108 108 106 107  CO2 18* 18* 18* 16* 18*  GLUCOSE 101* 96 119* 121* 116*  BUN 111* 110* 112* 109* 110*  CREATININE 4.27* 4.19* 4.43* 4.46* 4.55*  CALCIUM 8.3* 8.3* 8.3* 8.3* 8.3*   Liver Function Tests: No results for input(s): AST, ALT, ALKPHOS, BILITOT, PROT, ALBUMIN in the last 168 hours. No results for input(s): LIPASE, AMYLASE in the last 168 hours. No results for input(s): AMMONIA in the last 168 hours. CBC: Recent Labs  Lab 06/15/17 1730  06/17/17 0230 06/18/17 0247 06/19/17 0247 06/21/17 0136 06/22/17 0240  WBC 8.1   < > 4.8 5.4 6.8 5.9 6.1  NEUTROABS 5.9  --  3.0  --   --   --   --   HGB 7.0*   < > 8.0* 7.8* 7.9* 7.8* 8.1*  HCT 20.4*   < > 23.1* 22.7* 22.6* 22.6* 23.7*  MCV 82.3   < > 82.5 81.9 81.9 82.8 82.0  PLT 181   < > 173 187 207 192 209   < > = values in this interval not displayed.   Cardiac Enzymes: Recent Labs  Lab 06/15/17 1730 06/16/17 0435 06/16/17 1211  TROPONINI 0.06* 0.15* 0.12*   BNP: Invalid input(s): POCBNP CBG: No results for input(s): GLUCAP in the last 168 hours. D-Dimer No results for input(s): DDIMER in  the last 72 hours. Hgb A1c No results for input(s): HGBA1C in the last 72 hours. Lipid Profile No results for input(s): CHOL, HDL, LDLCALC, TRIG, CHOLHDL, LDLDIRECT in the last 72 hours. Thyroid function studies No results for input(s): TSH, T4TOTAL, T3FREE, THYROIDAB in the last 72 hours.  Invalid input(s): FREET3 Anemia work up No results for input(s): VITAMINB12, FOLATE, FERRITIN, TIBC, IRON, RETICCTPCT in the last 72 hours. Urinalysis    Component Value Date/Time   COLORURINE YELLOW 06/07/2017 [place] 06/07/2017 1313   LABSPEC 1.013 06/07/2017 1313   PHURINE 5.0 06/07/2017 1313   GLUCOSEU NEGATIVE 06/07/2017 1313   HGBUR NEGATIVE 06/07/2017 1313   BILIRUBINUR NEGATIVE 06/07/2017 1313   KETONESUR NEGATIVE 06/07/2017 1313   PROTEINUR 100 (  A) 06/07/2017 1313   NITRITE NEGATIVE 06/07/2017 1313   LEUKOCYTESUR NEGATIVE 06/07/2017 1313   Sepsis Labs Invalid input(s): PROCALCITONIN,  WBC,  LACTICIDVEN   Time coordinating discharge: 40 minutes  SIGNED:  Marzetta Board, MD  [facility] 06/22/2017, 11:16 AM Pager [phone]  If 7PM-7AM, please contact night-coverage [email or web address] Password [email or web address]

## 2017-06-22 NOTE — Care Management Important Message (Signed)
Important Message  Patient Details  Name: Johnny Morrow MRN: [id number] Date of Birth: Jul 16, 1920   Medicare Important Message Given:  Yes    [name] Abena 06/22/2017, 10:35 AM

## 2017-06-22 NOTE — Care Management Note (Signed)
Case Management Note Marvetta Gibbons RN, BSN Unit 4E-Case Manager [phone]  Patient Details  Name: Johnny Morrow MRN: [id number] Date of Birth: September 05, 1919  Subjective/Objective:   Pt with recent hip repair- admitted with elevated HR-aflutter - plan to manage medically no need for pacemaker at this time.                 Action/Plan: PTA pt lived at [facility]- was in their rehab SNF but has an [place] apartment- CSW following for return to SNF level at [facility] at time of discharge.   Expected Discharge Date:  06/22/17               Expected Discharge Plan:  [place]  In-House Referral:  Clinical Social Work  Discharge planning Services  CM Consult  Post Acute Care Choice:  NA Choice offered to:  NA  DME Arranged:    DME Agency:     HH Arranged:    [place] Agency:     Status of Service:  Completed, signed off  If discussed at [employer], dates discussed:    Discharge Disposition: skilled facility  Additional Comments:  06/22/17- 86- Johnny Qualley RN, CM- pt for d/c today- plan for SNF at [place] following for d/c and placement needs.   Johnny Patricia, RN 06/22/2017, 12:43 PM

## 2017-06-22 NOTE — Progress Notes (Signed)
Progress Note  Patient Name: Johnny Morrow Date of Encounter: 06/22/2017  Primary Cardiologist: [facility]  Subjective   No chest pain or dyspnea  Inpatient Medications    Scheduled Meds: . acidophilus  1 capsule Oral Daily  . aspirin EC  81 mg Oral BID  . cholecalciferol  5,000 Units Oral Daily  . diltiazem  360 mg Oral Daily  . doxazosin  1 mg Oral QPM  . ferrous sulfate  325 mg Oral Q breakfast  . isosorbide mononitrate  30 mg Oral Daily  . metoprolol tartrate  12.5 mg Oral BID  . polyethylene glycol  17 g Oral Daily  . sodium chloride flush  3 mL Intravenous Q12H   Continuous Infusions: . sodium chloride     PRN Meds: sodium chloride, bisacodyl, levalbuterol, sodium chloride flush, traMADol   Vital Signs    Vitals:   06/21/17 0441 06/21/17 1015 06/21/17 2025 06/22/17 0400  BP: 121/82 137/74 118/73 127/84  Pulse: (!) 53 82 (!) 57   Resp: 12  (!) 21 16  Temp: 97.8 F (36.6 C)  97.9 F (36.6 C) 98.1 F (36.7 C)  TempSrc: Oral  Oral Oral  SpO2:   94% 97%  Weight:      Height:        Intake/Output Summary (Last 24 hours) at 06/22/2017 1016 Last data filed at 06/21/2017 1800 Gross per 24 hour  Intake 480 ml  Output 600 ml  Net -120 ml   Filed Weights   06/15/17 1709 06/18/17 0416  Weight: 125 lb (56.7 kg) 125 lb (56.7 kg)    Telemetry    Atrial flutter; rate controlled- Personally Reviewed   Physical Exam   GEN: WD/WN No acute distress.   Neck: No JVD, supple Cardiac: irregular Respiratory: Clear to auscultation bilaterally; no wheeze GI: Soft, nontender, non-distended, no masses MS: No edema Neuro:  Nonfocal, grossly intact   Labs    Chemistry Recent Labs  Lab 06/19/17 0247 06/21/17 0136 06/22/17 0240  NA 135 132* 132*  K 3.9 4.0 4.2  CL 108 106 107  CO2 18* 16* 18*  GLUCOSE 119* 121* 116*  BUN 112* 109* 110*  CREATININE 4.43* 4.46* 4.55*  CALCIUM 8.3* 8.3* 8.3*  GFRNONAA 10* 10* 10*  GFRAA 12* 12* 12*  ANIONGAP 9 10 7      Hematology Recent Labs  Lab 06/19/17 0247 06/21/17 0136 06/22/17 0240  WBC 6.8 5.9 6.1  RBC 2.76* 2.73* 2.89*  HGB 7.9* 7.8* 8.1*  HCT 22.6* 22.6* 23.7*  MCV 81.9 82.8 82.0  MCH 28.6 28.6 28.0  MCHC 35.0 34.5 34.2  RDW 14.1 14.2 14.1  PLT 207 192 209    Cardiac Enzymes Recent Labs  Lab 06/15/17 1730 06/16/17 0435 06/16/17 1211  TROPONINI 0.06* 0.15* 0.12*    Patient Profile     83 y.o. male with history of atrial flutter, mild-moderate AS and HFpEF. Most recent echo 03/24/17 showed normal LVEF at 55%, tri leaflet AV with a mean gradient of 21 mmHg. There is no documented h/o CAD. Admitted with atrial flutter with RVR (h/o tachy brady).  Assessment & Plan    1. Atrial flutter with RVR Rates are now controlled to mildly reduced in early AM; no significant pauses; continue present dose of cardizem; decrease metoprolol to 12.5 mg BID.  Not felt to be a candidate for [facility]  Not a candidate for ablation No pacemaker per Dr Rayann Heman.  2. CKD, stage IV Per IM.  3.  HTN -  BP well controlled; continue present meds  Pt can be DCed from a cardiac standpoint and fu with his cardiologist at [facility].  For questions or updates, please contact [place] Please consult [email or web address] for contact info under Cardiology/STEMI.      Signed, Kirk Ruths, MD  06/22/2017, 10:16 AM

## 2017-06-22 NOTE — Progress Notes (Signed)
Patient will discharge to [facility] SNF Anticipated discharge date: 11/5 Transportation by family Report #: [phone]  [place] signing off.  Jorge Ny, LCSW Clinical Social Worker [phone]

## 2017-06-22 NOTE — Progress Notes (Signed)
Report called to Nonda Lou, RN @ [facility], rehab unit.  Discharge instructions reviewed with pt/daughter.  Pt/daughter verbalized understanding.  Discharge packet, including prescription for Tramadol, sent with daughter. PIV removed intact w/o S&S of complications.  Condom catheter removed w/o complications. [name] provided transportation back to facility.

## 2017-06-23 ENCOUNTER — Non-Acute Institutional Stay (SKILLED_NURSING_FACILITY): Payer: Medicare Other | Admitting: Internal Medicine

## 2017-06-23 ENCOUNTER — Encounter: Payer: Self-pay | Admitting: Internal Medicine

## 2017-06-23 DIAGNOSIS — R778 Other specified abnormalities of plasma proteins: Secondary | ICD-10-CM

## 2017-06-23 DIAGNOSIS — I5032 Chronic diastolic (congestive) heart failure: Secondary | ICD-10-CM

## 2017-06-23 DIAGNOSIS — M62551 Muscle wasting and atrophy, not elsewhere classified, right thigh: Secondary | ICD-10-CM | POA: Diagnosis not present

## 2017-06-23 DIAGNOSIS — K5904 Chronic idiopathic constipation: Secondary | ICD-10-CM | POA: Diagnosis not present

## 2017-06-23 DIAGNOSIS — N184 Chronic kidney disease, stage 4 (severe): Secondary | ICD-10-CM | POA: Diagnosis not present

## 2017-06-23 DIAGNOSIS — R2689 Other abnormalities of gait and mobility: Secondary | ICD-10-CM | POA: Diagnosis not present

## 2017-06-23 DIAGNOSIS — M6389 Disorders of muscle in diseases classified elsewhere, multiple sites: Secondary | ICD-10-CM | POA: Diagnosis not present

## 2017-06-23 DIAGNOSIS — Z9181 History of falling: Secondary | ICD-10-CM | POA: Diagnosis not present

## 2017-06-23 DIAGNOSIS — S72141A Displaced intertrochanteric fracture of right femur, initial encounter for closed fracture: Secondary | ICD-10-CM | POA: Diagnosis not present

## 2017-06-23 DIAGNOSIS — R7989 Other specified abnormal findings of blood chemistry: Secondary | ICD-10-CM

## 2017-06-23 DIAGNOSIS — R748 Abnormal levels of other serum enzymes: Secondary | ICD-10-CM | POA: Diagnosis not present

## 2017-06-23 DIAGNOSIS — M84451S Pathological fracture, right femur, sequela: Secondary | ICD-10-CM | POA: Diagnosis not present

## 2017-06-23 DIAGNOSIS — M4125 Other idiopathic scoliosis, thoracolumbar region: Secondary | ICD-10-CM | POA: Diagnosis not present

## 2017-06-23 DIAGNOSIS — Z86718 Personal history of other venous thrombosis and embolism: Secondary | ICD-10-CM | POA: Diagnosis not present

## 2017-06-23 DIAGNOSIS — M25551 Pain in right hip: Secondary | ICD-10-CM | POA: Diagnosis not present

## 2017-06-23 DIAGNOSIS — Z4789 Encounter for other orthopedic aftercare: Secondary | ICD-10-CM | POA: Diagnosis not present

## 2017-06-23 DIAGNOSIS — I498 Other specified cardiac arrhythmias: Secondary | ICD-10-CM | POA: Diagnosis not present

## 2017-06-23 DIAGNOSIS — R000 Tachycardia, unspecified: Secondary | ICD-10-CM | POA: Diagnosis not present

## 2017-06-23 DIAGNOSIS — R278 Other lack of coordination: Secondary | ICD-10-CM | POA: Diagnosis not present

## 2017-06-23 NOTE — Progress Notes (Signed)
Patient ID: Johnny Morrow, male   DOB: [birth year], 83 y.o.   MRN: [id number]  Provider:  Rexene Edison. Mariea Clonts, D.O., C.M.D. Location:  [facility] Room Number: [place] of Service:  SNF (31)  PCP: Wenda Low, MD Patient Care Team: Wenda Low, MD as PCP - General (Internal Medicine)  Extended Emergency Contact Information Primary Emergency Contact: Marylin Crosby of [place] Phone: [phone] Relation: Spouse Secondary Emergency Contact: Leanord Hawking [place] Mobile Phone: [phone] Relation: Daughter  Code Status: full code--needs discussion Goals of Care: Advanced Directive information Advanced Directives 06/23/2017  Does Patient Have a Medical Advance Directive? Yes  Type of Advance Directive [place]  Does patient want to make changes to medical advance directive? No - Patient declined  Copy of [place] in Chart? Yes    Chief Complaint  Patient presents with  . New Admit To SNF    Rehab admission    HPI: Patient is a 83 y.o. male seen today for admission to [facility] s/p hospitalization.  He has a h/o CKDIV followed by nephrology at [facility], prostate ca s/p resection, htn, HFpEF, moderate AS, paroxysmal afib (not on anticoagulation due to prior large retroperitoneal bleed).  He had been here in rehab initially after a fall at home 10/21 while changing his clothes.  He had experienced immediate hip pain and was unable to walk.  He was sent to the hospital and found to have a right comminuted hip fx.  He underwent IM nail on 10/21 by Dr. Doran Durand.  He was sent here that time on a 4 wk course of bid baby asa for DVT prophylaxis (thru 11/18). He was WBAT for PT, OT. He had tramadol for pain mgt.   Prior to this, he'd been hospitalized at [facility] with a CHF exacerbation with d/c dry weight said to be 108 lbs.  His family says it could have been 118 lbs but he's  never been 108.     He had acute blood loss anemia and was transfused 2 units prbcs 10/22 to hgb 8.6, but d/c hgb was 7.1.    He has a chronic popliteal vein thrombosis.    On 10/28 while here for rehab, he began having tachycardia.  His HR was 130, 125, 122 when checked.  Nursing contacted our on call NP who ordered lopressor and HR improved to 98, then 69, but then went back up to 121 at 11pm and 105 at 1am 10/29.  NP Melvyn Novas was asked to see him 10/29 due to the tachycardia at that point 122.  Weight was 125lbs.  He was given diltiazem x 2 w/o improvement.  Due to concerns for possible PE vs hypovolemia, he was sent to the ED.  He was in sinus tachycardia there.  He had a VQ scan which showed low probability for PE and suggested airways disease.  He was hydrated with IVFs and HR improved from 140s to 90s.  Diltiazem and metoprolol were added to his regimen with HR then running 50s at rest to 100s with PT.  He was noted to have elevated troponin felt to be due to demand ischemia.  Amlodipine and hydralazine were discontinued.  He was advised to f/u with Dr. Doran Durand in 1 week (11/8 at 1:30pm), have f/u cbc and bmp in 1 wk and f/u with his nephrologist at [facility].  Discharge weight was not in d/c summary, and last weight in cardiology note was  from 11/1 at 125lbs (but pt d/cd on 11/5).  Weight upon arrival to [facility] last evening was 139.2 lbs.  Today, he is 132 lbs.   When seen, his wife and one of his daughters were present.  His one and only concern was having a bowel movement.  He'd already received a dose of miralax and had been given a suppository which had not been successful after one hour.  His wife wanted him to have prune juice with warm butter so we ordered it and doubled up his miralax which can always be held if it works too well.    He denied chest pain, sob.  He has not been eating very well.  His hip pain is fairly well controlled--he's not using tramadol much at all.  He has no edema.    Past  Medical History:  Diagnosis Date  . Anemia   . Arthritis   . Atrial fibrillation ([place])   . Cataract    "ready to come out on both sides; anytime" (06/16/2017)  . CHF (congestive heart failure) ([place])   . CKD (chronic kidney disease), stage III ([place])    "followed by Dr. Keturah Barre @ [facility]; CKD fluctuates between III and IV" (06/16/2017)  . DVT (deep venous thrombosis) (HCC)    LLE  . History of blood transfusion ~ 01/2017; 05/2017   "blood loss; anemia"  . Hypertension   . Pneumonia 12/2016   "a touch"  . Prostate cancer [facility])     follows with hematologists q 6 months. S/p radical prostatectomy and proton therapy in 2001.  . Radiation cystitis   . Retinal hemorrhage of both eyes    follows with ophthalmologists; "never had OR" (06/16/2017)  . Retroperitoneal bleed ?12/2016   [name] 06/15/2017   Past Surgical History:  Procedure Laterality Date  . FRACTURE SURGERY    . INGUINAL HERNIA REPAIR    . PROSTATECTOMY    . TIBIA FRACTURE SURGERY Left 06/2012  . TONSILLECTOMY      reports that he quit smoking about 71 years ago. His smoking use included cigarettes. He quit after 10.00 years of use. he has never used smokeless tobacco. He reports that he drinks about 0.6 oz of alcohol per week. He reports that he does not use drugs. Social History   Socioeconomic History  . Marital status: Married    Spouse name: Not on file  . Number of children: Not on file  . Years of education: Not on file  . Highest education level: Not on file  Social Needs  . Financial resource strain: Not on file  . Food insecurity - worry: Not on file  . Food insecurity - inability: Not on file  . Transportation needs - medical: Not on file  . Transportation needs - non-medical: Not on file  Occupational History  . Not on file  Tobacco Use  . Smoking status: Former Smoker    Years: 10.00    Types: Cigarettes    Last attempt to quit: 1947    Years since quitting: 71.8  . Smokeless tobacco: Never Used    Substance and Sexual Activity  . Alcohol use: Yes    Alcohol/week: 0.6 oz    Types: 1 Glasses of wine per week  . Drug use: No  . Sexual activity: No  Other Topics Concern  . Not on file  Social History Narrative  . Not on file    Functional Status Survey:  dependent in adls except feeding himself, has caregivers at  home and his wife  Family History  Problem Relation Age of Onset  . COPD Brother   . Heart attack Brother   . Cancer Father   . Heart attack Father   . Diabetes Mother     Health Maintenance  Topic Date Due  . FOOT EXAM  06/26/1930  . OPHTHALMOLOGY EXAM  06/26/1930  . HEMOGLOBIN A1C  03/12/2017  . INFLUENZA VACCINE  03/18/2017  . URINE MICROALBUMIN  08/20/2017  . TETANUS/TDAP  04/28/2021  . PNA vac Low Risk Adult  Completed    Allergies  Allergen Reactions  . Iodinated Diagnostic Agents Rash  . Ace Inhibitors Other (See Comments)    Noted (on MAR) to be "allergic," but no reaction was cited/noted  . Shellfish-Derived Products Other (See Comments)    Noted (on MAR) to be "allergic," but no reaction was cited/noted  . Penicillins Rash    Has patient had a PCN reaction causing immediate rash, facial/tongue/throat swelling, SOB or lightheadedness with hypotension: Yes Has patient had a PCN reaction causing severe rash involving mucus membranes or skin necrosis: Unknown Has patient had a PCN reaction that required hospitalization: Unknown Has patient had a PCN reaction occurring within the last 10 years: Unknown If all of the above answers are "NO", then may proceed with Cephalosporin use.     Outpatient Encounter Medications as of 06/23/2017  Medication Sig  . albuterol (PROVENTIL HFA;VENTOLIN HFA) 108 (90 Base) MCG/ACT inhaler Inhale 2 puffs into the lungs every 6 (six) hours as needed for wheezing or shortness of breath.  Marland Kitchen aspirin EC 81 MG tablet Take 1 tablet (81 mg total) by mouth 2 (two) times daily.  . B Complex Vitamins (VITAMIN-B COMPLEX PO)  Take 1 tablet by mouth daily.   . bisacodyl (DULCOLAX) 5 MG EC tablet Take 1 tablet (5 mg total) by mouth daily as needed for moderate constipation.  . Cholecalciferol (VITAMIN D3) 5000 units TABS Take 5,000 Units by mouth daily.   . Coenzyme Q10 (CO Q 10) 100 MG CAPS Take 100 mg by mouth daily.  Marland Kitchen diltiazem (CARDIZEM CD) 360 MG 24 hr capsule Take 1 capsule (360 mg total) daily by mouth.  . doxazosin (CARDURA) 1 MG tablet Take 1 mg by mouth every evening.  . ferrous sulfate 325 (65 FE) MG tablet Take 1 tablet (325 mg total) by mouth daily with breakfast.  . furosemide (LASIX) 40 MG tablet Take 40 mg by mouth daily as needed (IF A WEIGHT GAIN OF 2 POUNDS OR MORE OCCURS).   . isosorbide mononitrate (IMDUR) 60 MG 24 hr tablet Take 0.5 tablets (30 mg total) daily by mouth.  . metoprolol tartrate (LOPRESSOR) 25 MG tablet Take 0.5 tablets (12.5 mg total) 2 (two) times daily by mouth.  . Multiple Vitamin (MULTI VITAMIN PO) Take 1 tablet by mouth daily.   . Omega 3 1000 MG CAPS Take 2,000 mg by mouth daily.  . polyethylene glycol (MIRALAX / GLYCOLAX) packet Take 17 g by mouth daily.  . Probiotic Product (PROBIOTIC PO) Chew 1 tablet (250 million cell) by mouth once a day  . senna (SENOKOT) 8.6 MG TABS tablet Take 1 tablet by mouth every morning.  . traMADol (ULTRAM) 50 MG tablet Take 1 tablet (50 mg total) every 6 (six) hours as needed by mouth for moderate pain.  . [DISCONTINUED] Elastic Bandages & Supports (LUMBAR BACK BRACE/SUPPORT PAD) MISC Use as directed for lumbar scoliosis   No facility-administered encounter medications on file as of 06/23/2017.  Review of Systems  Constitutional: Positive for activity change, appetite change, fatigue and unexpected weight change. Negative for chills and fever.  HENT: Negative for congestion and trouble swallowing.   Eyes: Negative for visual disturbance.       Glasses  Respiratory: Negative for cough, chest tightness and shortness of breath.     Gastrointestinal: Positive for constipation. Negative for abdominal pain, blood in stool, diarrhea, nausea and vomiting.  Genitourinary: Negative for dysuria.  Musculoskeletal: Positive for arthralgias and gait problem.  Skin: Negative for color change.  Neurological: Positive for weakness. Negative for dizziness.  Hematological: Bruises/bleeds easily.  Psychiatric/Behavioral: Negative for agitation and sleep disturbance. The patient is not nervous/anxious.     Vitals:   06/23/17 1000  BP: 122/70  Pulse: (!) 48  Resp: 20  Temp: (!) 97.3 F (36.3 C)  TempSrc: Oral  SpO2: 96%  Weight: 139 lb (63 kg)   Body mass index is 23.86 kg/m. Physical Exam  Constitutional: He is oriented to person, place, and time. No distress.  Chronically ill, frail-appearing african [place] male  HENT:  Head: Normocephalic and atraumatic.  Right Ear: External ear normal.  Left Ear: External ear normal.  Mouth/Throat: Oropharynx is clear and moist. No oropharyngeal exudate.  Eyes: Conjunctivae are normal. Pupils are equal, round, and reactive to light.  Neck: Neck supple. No JVD present.  Cardiovascular: Normal rate, regular rhythm and intact distal pulses.  Murmur heard. Pulmonary/Chest: Effort normal and breath sounds normal. No respiratory distress.  Abdominal: Soft. Bowel sounds are normal. He exhibits no distension. There is no tenderness.  Musculoskeletal: Normal range of motion.  Right hip incisions both without drainage outside of dressings, no erythema or warmth, minimal ecchymoses remaining  Lymphadenopathy:    He has no cervical adenopathy.  Neurological: He is alert and oriented to person, place, and time. No cranial nerve deficit.  Skin: Skin is warm and dry.  Psychiatric: He has a normal mood and affect.    Labs reviewed: Basic Metabolic Panel: Recent Labs    06/12/17 0308  06/19/17 0247 06/21/17 0136 06/22/17 0240  NA 136   < > 135 132* 132*  K 3.6   < > 3.9 4.0 4.2  CL  108   < > 108 106 107  CO2 18*   < > 18* 16* 18*  GLUCOSE 124*   < > 119* 121* 116*  BUN 91*   < > 112* 109* 110*  CREATININE 4.86*   < > 4.43* 4.46* 4.55*  CALCIUM 8.4*   < > 8.3* 8.3* 8.3*  PHOS 3.6  --   --   --   --    < > = values in this interval not displayed.   Liver Function Tests: Recent Labs    07/29/16 08/20/16 1221 06/07/17 1022 06/12/17 0308  AST 18 19 30   --   ALT 8* 9 18  --   ALKPHOS 68 61 62  --   BILITOT  --  0.5 0.9  --   PROT  --  6.3 6.6  --   ALBUMIN  --  3.7 3.3* 2.4*   No results for input(s): LIPASE, AMYLASE in the last 8760 hours. No results for input(s): AMMONIA in the last 8760 hours. CBC: Recent Labs    06/11/17 0434  06/15/17 1730  06/17/17 0230  06/19/17 0247 06/21/17 0136 06/22/17 0240  WBC 4.9   < > 8.1   < > 4.8   < > 6.8 5.9 6.1  NEUTROABS  3.0  --  5.9  --  3.0  --   --   --   --   HGB 7.9*   < > 7.0*   < > 8.0*   < > 7.9* 7.8* 8.1*  HCT 22.3*   < > 20.4*   < > 23.1*   < > 22.6* 22.6* 23.7*  MCV 80.8   < > 82.3   < > 82.5   < > 81.9 82.8 82.0  PLT 89*   < > 181   < > 173   < > 207 192 209   < > = values in this interval not displayed.   Cardiac Enzymes: Recent Labs    06/15/17 1730 06/16/17 0435 06/16/17 1211  TROPONINI 0.06* 0.15* 0.12*   BNP: Invalid input(s): POCBNP Lab Results  Component Value Date   HGBA1C 5.8 09/12/2016   Lab Results  Component Value Date   TSH 4.056 06/16/2017   Lab Results  Component Value Date   IONGEXBM84 132 06/11/2017   Lab Results  Component Value Date   FOLATE 32.0 06/11/2017   Lab Results  Component Value Date   IRON 18 (L) 06/11/2017   TIBC 174 (L) 06/11/2017   FERRITIN 332 06/11/2017    Imaging and Procedures obtained prior to SNF admission: Nm Pulmonary Vent And Perf (v/q Scan)  Result Date: 06/15/2017 CLINICAL DATA:  Tachycardia and weakness. Recent hip surgery. PE suspected, high pretest prob EXAM: NUCLEAR MEDICINE VENTILATION - PERFUSION LUNG SCAN TECHNIQUE:  Ventilation images were obtained in multiple projections using inhaled aerosol Tc-10m DTPA. Perfusion images were obtained in multiple projections after intravenous injection of Tc-19m MAA. RADIOPHARMACEUTICALS:  31.6 mCi Technetium-39m DTPA aerosol inhalation and 4.40 mCi Technetium-69m MAA IV COMPARISON:  Chest radiograph earlier this day FINDINGS: Ventilation: Diffusely heterogeneous, no focal ventilation defect. Perfusion: No wedge shaped peripheral perfusion defects to suggest acute pulmonary embolism. Enlarged cardiac silhouette is noted. Perfusion more homogeneous on ventilation. IMPRESSION: 1. Low probability for pulmonary embolus. 2. Profusion is more homogeneous than ventilation, which can be seen with airways disease. Electronically Signed   By: Jeb Levering M.D.   On: 06/15/2017 23:22   Dg Chest Portable 1 View  Result Date: 06/15/2017 CLINICAL DATA:  Tachycardia EXAM: PORTABLE CHEST 1 VIEW COMPARISON:  06/07/2017 FINDINGS: Prior interstitial prominence has improved/resolved, suggesting resolution of interstitial edema. Mild patchy bilateral lower lobe opacities, likely atelectasis. No definite pleural effusions. No pneumothorax. Cardiomegaly. Midthoracic dextroscoliosis. IMPRESSION: Prior interstitial edema has resolved. Mild patchy bilateral lower lobe opacities, likely atelectasis. Electronically Signed   By: Julian Hy M.D.   On: 06/15/2017 18:06    Assessment/Plan 1. Sinus tachycardia -resolved with addition of diltiazem and lopressor--added hold parameters due to now brady at times  2. Chronic diastolic CHF (congestive heart failure) (HCC) -cont lasix prn wt gain of 2 lbs in 24 h or 5 lbs in 1 week with daily weights--opted not to give today as weight trending down  3. Chronic kidney disease (CKD), stage IV (severe) (HCC) -Avoid nephrotoxic agents like nsaids, dose adjust renally excreted meds, hydrate. -f/u with nephrology at [facility]  4. Elevated troponin -felt to be  due to demand ischemia with tachycardia at last hospital admission, resolved  5. History of DVT of lower extremity, left -noted.  Cont bid baby asa only due to prior large retroperitoneal bleed "5/18"  6. Closed intertrochanteric fracture of hip, right, initial encounter ([place]) 10 /18 -s/p IM nail with Dr. Doran Durand, f/u with him, cont asa  81mg  po bid thru 11/18 for dvt prophylaxis, cont PT, OT  7. Other idiopathic scoliosis, thoracolumbar region -has caused him some chronic pain per family, use tylenol, rarely using the tramadol ordered for his hip  8. Chronic idiopathic constipation -try the warm prune juice with melted butter and double the miralax dose daily, hold for loose stools  Family/ staff Communication:  Discussed with pt, his wife and his daughter as well as rehab nurse  Labs/tests ordered:  Daily wts with parameters, hold parameters for lopressor due to bradycardia, VS q shift, cbc, bmp in 1 wk  Needs advance care planning discussion and MOST form completed.  Appears quite chronically ill and high risk for readmission.  Remains full code.  Daughter, Lingard, is a psychiatrist with medical background.    Katey Barrie L. Julicia Krieger, D.O. [facility] [address], [place] [address] Cell Phone (Mon-Fri 8am-5pm):  [phone] On Call:  [phone] & follow prompts after 5pm & weekends Office Phone:  [phone] Office Fax:  [phone]

## 2017-06-24 DIAGNOSIS — M62551 Muscle wasting and atrophy, not elsewhere classified, right thigh: Secondary | ICD-10-CM | POA: Diagnosis not present

## 2017-06-24 DIAGNOSIS — I498 Other specified cardiac arrhythmias: Secondary | ICD-10-CM | POA: Diagnosis not present

## 2017-06-24 DIAGNOSIS — M25551 Pain in right hip: Secondary | ICD-10-CM | POA: Diagnosis not present

## 2017-06-24 DIAGNOSIS — M6389 Disorders of muscle in diseases classified elsewhere, multiple sites: Secondary | ICD-10-CM | POA: Diagnosis not present

## 2017-06-24 DIAGNOSIS — R278 Other lack of coordination: Secondary | ICD-10-CM | POA: Diagnosis not present

## 2017-06-24 DIAGNOSIS — R2689 Other abnormalities of gait and mobility: Secondary | ICD-10-CM | POA: Diagnosis not present

## 2017-06-25 DIAGNOSIS — M25551 Pain in right hip: Secondary | ICD-10-CM | POA: Diagnosis not present

## 2017-06-25 DIAGNOSIS — M6389 Disorders of muscle in diseases classified elsewhere, multiple sites: Secondary | ICD-10-CM | POA: Diagnosis not present

## 2017-06-25 DIAGNOSIS — R2689 Other abnormalities of gait and mobility: Secondary | ICD-10-CM | POA: Diagnosis not present

## 2017-06-25 DIAGNOSIS — M62551 Muscle wasting and atrophy, not elsewhere classified, right thigh: Secondary | ICD-10-CM | POA: Diagnosis not present

## 2017-06-25 DIAGNOSIS — S72001D Fracture of unspecified part of neck of right femur, subsequent encounter for closed fracture with routine healing: Secondary | ICD-10-CM | POA: Diagnosis not present

## 2017-06-25 DIAGNOSIS — R278 Other lack of coordination: Secondary | ICD-10-CM | POA: Diagnosis not present

## 2017-06-25 DIAGNOSIS — I498 Other specified cardiac arrhythmias: Secondary | ICD-10-CM | POA: Diagnosis not present

## 2017-06-25 DIAGNOSIS — Z4789 Encounter for other orthopedic aftercare: Secondary | ICD-10-CM | POA: Diagnosis not present

## 2017-06-26 DIAGNOSIS — M6389 Disorders of muscle in diseases classified elsewhere, multiple sites: Secondary | ICD-10-CM | POA: Diagnosis not present

## 2017-06-26 DIAGNOSIS — R278 Other lack of coordination: Secondary | ICD-10-CM | POA: Diagnosis not present

## 2017-06-26 DIAGNOSIS — R2689 Other abnormalities of gait and mobility: Secondary | ICD-10-CM | POA: Diagnosis not present

## 2017-06-26 DIAGNOSIS — M25551 Pain in right hip: Secondary | ICD-10-CM | POA: Diagnosis not present

## 2017-06-26 DIAGNOSIS — M62551 Muscle wasting and atrophy, not elsewhere classified, right thigh: Secondary | ICD-10-CM | POA: Diagnosis not present

## 2017-06-26 DIAGNOSIS — I498 Other specified cardiac arrhythmias: Secondary | ICD-10-CM | POA: Diagnosis not present

## 2017-06-29 DIAGNOSIS — M6389 Disorders of muscle in diseases classified elsewhere, multiple sites: Secondary | ICD-10-CM | POA: Diagnosis not present

## 2017-06-29 DIAGNOSIS — M62551 Muscle wasting and atrophy, not elsewhere classified, right thigh: Secondary | ICD-10-CM | POA: Diagnosis not present

## 2017-06-29 DIAGNOSIS — R2689 Other abnormalities of gait and mobility: Secondary | ICD-10-CM | POA: Diagnosis not present

## 2017-06-29 DIAGNOSIS — R278 Other lack of coordination: Secondary | ICD-10-CM | POA: Diagnosis not present

## 2017-06-29 DIAGNOSIS — M25551 Pain in right hip: Secondary | ICD-10-CM | POA: Diagnosis not present

## 2017-06-29 DIAGNOSIS — I498 Other specified cardiac arrhythmias: Secondary | ICD-10-CM | POA: Diagnosis not present

## 2017-06-30 ENCOUNTER — Non-Acute Institutional Stay (SKILLED_NURSING_FACILITY): Payer: Medicare Other | Admitting: Internal Medicine

## 2017-06-30 DIAGNOSIS — M6389 Disorders of muscle in diseases classified elsewhere, multiple sites: Secondary | ICD-10-CM | POA: Diagnosis not present

## 2017-06-30 DIAGNOSIS — N184 Chronic kidney disease, stage 4 (severe): Secondary | ICD-10-CM

## 2017-06-30 DIAGNOSIS — N185 Chronic kidney disease, stage 5: Secondary | ICD-10-CM | POA: Diagnosis not present

## 2017-06-30 DIAGNOSIS — R2689 Other abnormalities of gait and mobility: Secondary | ICD-10-CM | POA: Diagnosis not present

## 2017-06-30 DIAGNOSIS — I480 Paroxysmal atrial fibrillation: Secondary | ICD-10-CM | POA: Diagnosis not present

## 2017-06-30 DIAGNOSIS — D649 Anemia, unspecified: Secondary | ICD-10-CM | POA: Diagnosis not present

## 2017-06-30 DIAGNOSIS — I5032 Chronic diastolic (congestive) heart failure: Secondary | ICD-10-CM | POA: Diagnosis not present

## 2017-06-30 DIAGNOSIS — R000 Tachycardia, unspecified: Secondary | ICD-10-CM | POA: Diagnosis not present

## 2017-06-30 DIAGNOSIS — I5022 Chronic systolic (congestive) heart failure: Secondary | ICD-10-CM | POA: Diagnosis not present

## 2017-06-30 DIAGNOSIS — M62551 Muscle wasting and atrophy, not elsewhere classified, right thigh: Secondary | ICD-10-CM | POA: Diagnosis not present

## 2017-06-30 DIAGNOSIS — I498 Other specified cardiac arrhythmias: Secondary | ICD-10-CM | POA: Diagnosis not present

## 2017-06-30 DIAGNOSIS — R278 Other lack of coordination: Secondary | ICD-10-CM | POA: Diagnosis not present

## 2017-06-30 DIAGNOSIS — D631 Anemia in chronic kidney disease: Secondary | ICD-10-CM | POA: Diagnosis not present

## 2017-06-30 DIAGNOSIS — M25551 Pain in right hip: Secondary | ICD-10-CM | POA: Diagnosis not present

## 2017-06-30 LAB — BASIC METABOLIC PANEL
BUN: 110 — AB (ref 4–21)
Creatinine: 5.1 — AB (ref 0.6–1.3)
Glucose: 95
Potassium: 4.7 (ref 3.4–5.3)
Sodium: 134 — AB (ref 137–147)

## 2017-06-30 LAB — CBC AND DIFFERENTIAL
HCT: 28 — AB (ref 41–53)
Hemoglobin: 9.1 — AB (ref 13.5–17.5)
Platelets: 166 (ref 150–399)
WBC: 5.1

## 2017-07-01 ENCOUNTER — Encounter: Payer: Self-pay | Admitting: Internal Medicine

## 2017-07-01 DIAGNOSIS — I498 Other specified cardiac arrhythmias: Secondary | ICD-10-CM | POA: Diagnosis not present

## 2017-07-01 DIAGNOSIS — M25551 Pain in right hip: Secondary | ICD-10-CM | POA: Diagnosis not present

## 2017-07-01 DIAGNOSIS — R2689 Other abnormalities of gait and mobility: Secondary | ICD-10-CM | POA: Diagnosis not present

## 2017-07-01 DIAGNOSIS — R278 Other lack of coordination: Secondary | ICD-10-CM | POA: Diagnosis not present

## 2017-07-01 DIAGNOSIS — M62551 Muscle wasting and atrophy, not elsewhere classified, right thigh: Secondary | ICD-10-CM | POA: Diagnosis not present

## 2017-07-01 DIAGNOSIS — M6389 Disorders of muscle in diseases classified elsewhere, multiple sites: Secondary | ICD-10-CM | POA: Diagnosis not present

## 2017-07-01 NOTE — Progress Notes (Signed)
Location:  [name], manufacturing systems of Service:  SNF (31)SNF Provider:  Karyssa Amaral L. Mariea Clonts, D.O., C.M.D.  Wenda Low, MD  Patient Care Team: Wenda Low, MD as PCP - General (Internal Medicine)  Extended Emergency Contact Information Primary Emergency Contact: Marylin Crosby of [place] Phone: [phone] Relation: Spouse Secondary Emergency Contact: Leanord Hawking [place] of [place] Mobile Phone: [phone] Relation: Daughter  Code Status:  FULL CODE Goals of care: Advanced Directive information Advanced Directives 06/23/2017  Does Patient Have a Medical Advance Directive? Yes  Type of Advance Directive [place]  Does patient want to make changes to medical advance directive? No - Patient declined  Copy of [place] in Chart? Yes   Chief Complaint  Patient presents with  . Acute Visit    tachycardia in mornings and bradycardia the rest of the day    HPI:  Pt is a 83 y.o. male with h/o HTN, prediabetes, CKDIV, paroxysmal afib, and chronic diastolic heart failure seen today for an acute visit for tachycardia in the mornings between 8-10:30 am and bradycardia in the 40s-50s the majority of the rest of the day.  He has been on lopressor 12.5mg  po bid and cardizem 360mg  po daily since his hospitalization for rate control.  His lopressor has been held 3 nights, but his HR remains high all mornings between 03-1029 am when it's checked--ranging from 105-122 even when he received the lopressor in the evening.  The only other thing that occurs in the morning is use of a heating pad that he keeps over his lower abdomen.    When seen today, he reported that he was very fatigued doing his therapy with significant dyspnea on exertion.  He had to stop his therapy (walking around the unit).  The previous day, he'd been feeling better, up walking with therapy and doing some walking within his room.   Late this afternoon/early evening, he was resting in bed, falling asleep.  He said he wants to go home next Wednesday (8 days) which does not seem realistic at this point.  His wife wants him able to walk around and do his ADLs before he returns home.  I spoke with his daughter by phone who had returned to [employer] where she is a Teacher, music.  We discussed that he is still requiring SNF care.  We also reviewed that he has several chronic conditions with his renal failure and heart failure causing his debility so it is a lot to work through.  He is 82 yo, also.  Past Medical History:  Diagnosis Date  . Anemia   . Arthritis   . Atrial fibrillation ([place])   . Cataract    "ready to come out on both sides; anytime" (06/16/2017)  . CHF (congestive heart failure) ([place])   . CKD (chronic kidney disease), stage III ([place])    "followed by Dr. Keturah Barre @ [place]; CKD fluctuates between III and IV" (06/16/2017)  . DVT (deep venous thrombosis) (HCC)    LLE  . History of blood transfusion ~ 01/2017; 05/2017   "blood loss; anemia"  . Hypertension   . Pneumonia 12/2016   "a touch"  . Prostate cancer [facility])     follows with hematologists q 6 months. S/p radical prostatectomy and proton therapy in 2001.  . Radiation cystitis   . Retinal hemorrhage of both eyes    follows with ophthalmologists; "never had OR" (06/16/2017)  . Retroperitoneal bleed ?12/2016   /  notes 06/15/2017   Past Surgical History:  Procedure Laterality Date  . FRACTURE SURGERY    . INGUINAL HERNIA REPAIR    . PROSTATECTOMY    . TIBIA FRACTURE SURGERY Left 06/2012  . TONSILLECTOMY      Allergies  Allergen Reactions  . Iodinated Diagnostic Agents Rash  . Ace Inhibitors Other (See Comments)    Noted (on MAR) to be "allergic," but no reaction was cited/noted  . Shellfish-Derived Products Other (See Comments)    Noted (on MAR) to be "allergic," but no reaction was cited/noted  . Penicillins Rash    Has patient had a PCN reaction causing  immediate rash, facial/tongue/throat swelling, SOB or lightheadedness with hypotension: Yes Has patient had a PCN reaction causing severe rash involving mucus membranes or skin necrosis: Unknown Has patient had a PCN reaction that required hospitalization: Unknown Has patient had a PCN reaction occurring within the last 10 years: Unknown If all of the above answers are "NO", then may proceed with Cephalosporin use.     Outpatient Encounter Medications as of 06/30/2017  Medication Sig  . albuterol (PROVENTIL HFA;VENTOLIN HFA) 108 (90 Base) MCG/ACT inhaler Inhale 2 puffs into the lungs every 6 (six) hours as needed for wheezing or shortness of breath.  Marland Kitchen aspirin EC 81 MG tablet Take 1 tablet (81 mg total) by mouth 2 (two) times daily.  . B Complex Vitamins (VITAMIN-B COMPLEX PO) Take 1 tablet by mouth daily.   . bisacodyl (DULCOLAX) 5 MG EC tablet Take 1 tablet (5 mg total) by mouth daily as needed for moderate constipation.  . Cholecalciferol (VITAMIN D3) 5000 units TABS Take 5,000 Units by mouth daily.   . Coenzyme Q10 (CO Q 10) 100 MG CAPS Take 100 mg by mouth daily.  Marland Kitchen diltiazem (CARDIZEM CD) 360 MG 24 hr capsule Take 1 capsule (360 mg total) daily by mouth.  . doxazosin (CARDURA) 1 MG tablet Take 1 mg by mouth every evening.  . ferrous sulfate 325 (65 FE) MG tablet Take 1 tablet (325 mg total) by mouth daily with breakfast.  . furosemide (LASIX) 40 MG tablet Take 40 mg by mouth daily as needed (IF A WEIGHT GAIN OF 2 POUNDS OR MORE OCCURS).   . isosorbide mononitrate (IMDUR) 60 MG 24 hr tablet Take 0.5 tablets (30 mg total) daily by mouth.  . metoprolol tartrate (LOPRESSOR) 25 MG tablet Take 0.5 tablets (12.5 mg total) 2 (two) times daily by mouth.  . Multiple Vitamin (MULTI VITAMIN PO) Take 1 tablet by mouth daily.   . Omega 3 1000 MG CAPS Take 2,000 mg by mouth daily.  . polyethylene glycol (MIRALAX / GLYCOLAX) packet Take 17 g by mouth daily.  . Probiotic Product (PROBIOTIC PO) Chew 1  tablet (250 million cell) by mouth once a day  . senna (SENOKOT) 8.6 MG TABS tablet Take 1 tablet by mouth every morning.  . traMADol (ULTRAM) 50 MG tablet Take 1 tablet (50 mg total) every 6 (six) hours as needed by mouth for moderate pain.   No facility-administered encounter medications on file as of 06/30/2017.     Review of Systems  Constitutional: Positive for malaise/fatigue and weight loss. Negative for chills and fever.  HENT: Negative for congestion.   Eyes: Negative for blurred vision.       Glasses  Respiratory: Positive for shortness of breath. Negative for cough.   Cardiovascular: Negative for chest pain, palpitations and leg swelling.  Gastrointestinal: Positive for constipation. Negative for abdominal pain, blood  in stool and melena.  Genitourinary: Negative for dysuria.  Musculoskeletal: Positive for joint pain. Negative for falls.  Neurological: Positive for weakness. Negative for dizziness and loss of consciousness.  Endo/Heme/Allergies: Does not bruise/bleed easily.  Psychiatric/Behavioral:       Slow to respond and find words    Immunization History  Administered Date(s) Administered  . Influenza-Unspecified 04/19/2015  . Pneumococcal Conjugate-13 11/30/2013  . Pneumococcal Polysaccharide-23 05/15/2008  . Tdap 04/29/2011  . Zoster 11/12/2007   Pertinent  Health Maintenance Due  Topic Date Due  . FOOT EXAM  06/26/1930  . OPHTHALMOLOGY EXAM  06/26/1930  . HEMOGLOBIN A1C  03/12/2017  . INFLUENZA VACCINE  03/18/2017  . URINE MICROALBUMIN  08/20/2017  . PNA vac Low Risk Adult  Completed   Fall Risk  12/24/2015  Falls in the past year? No  Risk for fall due to : Other (Comment)  Risk for fall due to: Comment Wears glasses   Functional Status Survey:    There were no vitals filed for this visit. There is no height or weight on file to calculate BMI. Physical Exam  Constitutional: No distress.  Frail AA male resting in recliner chair  HENT:  Head:  Normocephalic and atraumatic.  Eyes:  glasses  Cardiovascular:  irreg irreg  Pulmonary/Chest: Effort normal and breath sounds normal. No respiratory distress. He has no rales.  Abdominal: Soft. Bowel sounds are normal. He exhibits no distension. There is no tenderness.  Musculoskeletal: Normal range of motion. He exhibits tenderness.  Right hip  Neurological: He is alert.  Skin: Skin is warm and dry. Capillary refill takes less than 2 seconds.  Psychiatric: He has a normal mood and affect.    Labs reviewed: Recent Labs    06/12/17 0308  06/19/17 0247 06/21/17 0136 06/22/17 0240  NA 136   < > 135 132* 132*  K 3.6   < > 3.9 4.0 4.2  CL 108   < > 108 106 107  CO2 18*   < > 18* 16* 18*  GLUCOSE 124*   < > 119* 121* 116*  BUN 91*   < > 112* 109* 110*  CREATININE 4.86*   < > 4.43* 4.46* 4.55*  CALCIUM 8.4*   < > 8.3* 8.3* 8.3*  PHOS 3.6  --   --   --   --    < > = values in this interval not displayed.   Recent Labs    07/29/16 08/20/16 1221 06/07/17 1022 06/12/17 0308  AST 18 19 30   --   ALT 8* 9 18  --   ALKPHOS 68 61 62  --   BILITOT  --  0.5 0.9  --   PROT  --  6.3 6.6  --   ALBUMIN  --  3.7 3.3* 2.4*   Recent Labs    06/11/17 0434  06/15/17 1730  06/17/17 0230  06/19/17 0247 06/21/17 0136 06/22/17 0240  WBC 4.9   < > 8.1   < > 4.8   < > 6.8 5.9 6.1  NEUTROABS 3.0  --  5.9  --  3.0  --   --   --   --   HGB 7.9*   < > 7.0*   < > 8.0*   < > 7.9* 7.8* 8.1*  HCT 22.3*   < > 20.4*   < > 23.1*   < > 22.6* 22.6* 23.7*  MCV 80.8   < > 82.3   < > 82.5   < >  81.9 82.8 82.0  PLT 89*   < > 181   < > 173   < > 207 192 209   < > = values in this interval not displayed.   Lab Results  Component Value Date   TSH 4.056 06/16/2017   Lab Results  Component Value Date   HGBA1C 5.8 09/12/2016   Lab Results  Component Value Date   CHOL 211 (A) 07/29/2016   HDL 99 (A) 07/29/2016   LDLCALC 97 07/29/2016   TRIG 76 07/29/2016    Significant Diagnostic Results in last  30 days:  Dg Chest 1 View  Result Date: 06/07/2017 CLINICAL DATA:  Acute right hip fracture. Pre-op respiratory exam. Prostate carcinoma. EXAM: CHEST 1 VIEW COMPARISON:  09/30/2016 FINDINGS: Heart size is stable.  Severe thoracolumbar dextroscoliosis. Diffuse nodular pulmonary interstitial prominence is new since previous study. This is nonspecific, and may be due to pulmonary edema, viral pneumonitis, or possibly metastatic disease. No evidence of pneumothorax or pleural effusion. IMPRESSION: New diffuse nodular pulmonary interstitial prominence, which is nonspecific. Differential considerations include pulmonary edema, viral pneumonitis, or possibly metastatic disease. Recommend clinical correlation, and consider continued chest radiographic followup versus chest CT. Electronically Signed   By: Earle Gell M.D.   On: 06/07/2017 11:49   Ct Head Wo Contrast  Result Date: 06/07/2017 CLINICAL DATA:  Recent fall, trauma, high risk for injury EXAM: CT HEAD WITHOUT CONTRAST CT CERVICAL SPINE WITHOUT CONTRAST TECHNIQUE: Multidetector CT imaging of the head and cervical spine was performed following the standard protocol without intravenous contrast. Multiplanar CT image reconstructions of the cervical spine were also generated. COMPARISON:  None available FINDINGS: CT HEAD FINDINGS Brain: Brain atrophy evident with advanced chronic white matter microvascular ischemic changes throughout both cerebral hemispheres. No acute intracranial hemorrhage, mass lesion, definite new infarction, midline shift, herniation, hydrocephalus, or extra-axial fluid collection. No focal mass effect or edema. Cisterns are patent. Cerebellar atrophy as well. Vascular: Intracranial atherosclerosis noted.  No hyperdense vessel. Skull: No depressed skull fracture.  Left mastoid effusion noted. Sinuses/Orbits: Chronic scattered sinus mucosal thickening. Orbits are unremarkable and symmetric. Other: None. CT CERVICAL SPINE FINDINGS  Alignment: Normal alignment. No subluxation or dislocation. Facets are aligned. Multilevel facet arthropathy noted. Skull base and vertebrae: Bones are osteopenic. No acute osseous finding or fracture. Soft tissues and spinal canal: No prevertebral fluid or swelling. No visible canal hematoma. Carotid atherosclerosis evident. Disc levels: Multilevel degenerative spondylosis noted with disc space narrowing, sclerosis and endplate osteophytes spanning C2-C7 at all levels. Facet arthropathy most pronounced at C4-5 on the left. Upper chest: No acute finding.  Apical scarring. Other: None. IMPRESSION: Brain atrophy and chronic white matter microvascular changes. No acute intracranial abnormality by noncontrast CT Nonspecific left mastoid effusion Minor chronic sinus disease Cervical degenerative spondylosis and osteopenia without acute osseous finding, fracture or malalignment by CT. Electronically Signed   By: Jerilynn Mages.  Shick M.D.   On: 06/07/2017 11:35   Ct Cervical Spine Wo Contrast  Result Date: 06/07/2017 CLINICAL DATA:  Recent fall, trauma, high risk for injury EXAM: CT HEAD WITHOUT CONTRAST CT CERVICAL SPINE WITHOUT CONTRAST TECHNIQUE: Multidetector CT imaging of the head and cervical spine was performed following the standard protocol without intravenous contrast. Multiplanar CT image reconstructions of the cervical spine were also generated. COMPARISON:  None available FINDINGS: CT HEAD FINDINGS Brain: Brain atrophy evident with advanced chronic white matter microvascular ischemic changes throughout both cerebral hemispheres. No acute intracranial hemorrhage, mass lesion, definite new infarction, midline shift, herniation, hydrocephalus,  or extra-axial fluid collection. No focal mass effect or edema. Cisterns are patent. Cerebellar atrophy as well. Vascular: Intracranial atherosclerosis noted.  No hyperdense vessel. Skull: No depressed skull fracture.  Left mastoid effusion noted. Sinuses/Orbits: Chronic  scattered sinus mucosal thickening. Orbits are unremarkable and symmetric. Other: None. CT CERVICAL SPINE FINDINGS Alignment: Normal alignment. No subluxation or dislocation. Facets are aligned. Multilevel facet arthropathy noted. Skull base and vertebrae: Bones are osteopenic. No acute osseous finding or fracture. Soft tissues and spinal canal: No prevertebral fluid or swelling. No visible canal hematoma. Carotid atherosclerosis evident. Disc levels: Multilevel degenerative spondylosis noted with disc space narrowing, sclerosis and endplate osteophytes spanning C2-C7 at all levels. Facet arthropathy most pronounced at C4-5 on the left. Upper chest: No acute finding.  Apical scarring. Other: None. IMPRESSION: Brain atrophy and chronic white matter microvascular changes. No acute intracranial abnormality by noncontrast CT Nonspecific left mastoid effusion Minor chronic sinus disease Cervical degenerative spondylosis and osteopenia without acute osseous finding, fracture or malalignment by CT. Electronically Signed   By: Jerilynn Mages.  Shick M.D.   On: 06/07/2017 11:35   Nm Pulmonary Vent And Perf (v/q Scan)  Result Date: 06/15/2017 CLINICAL DATA:  Tachycardia and weakness. Recent hip surgery. PE suspected, high pretest prob EXAM: NUCLEAR MEDICINE VENTILATION - PERFUSION LUNG SCAN TECHNIQUE: Ventilation images were obtained in multiple projections using inhaled aerosol Tc-73m DTPA. Perfusion images were obtained in multiple projections after intravenous injection of Tc-55m MAA. RADIOPHARMACEUTICALS:  31.6 mCi Technetium-77m DTPA aerosol inhalation and 4.40 mCi Technetium-59m MAA IV COMPARISON:  Chest radiograph earlier this day FINDINGS: Ventilation: Diffusely heterogeneous, no focal ventilation defect. Perfusion: No wedge shaped peripheral perfusion defects to suggest acute pulmonary embolism. Enlarged cardiac silhouette is noted. Perfusion more homogeneous on ventilation. IMPRESSION: 1. Low probability for pulmonary  embolus. 2. Profusion is more homogeneous than ventilation, which can be seen with airways disease. Electronically Signed   By: Jeb Levering M.D.   On: 06/15/2017 23:22   Dg Chest Portable 1 View  Result Date: 06/15/2017 CLINICAL DATA:  Tachycardia EXAM: PORTABLE CHEST 1 VIEW COMPARISON:  06/07/2017 FINDINGS: Prior interstitial prominence has improved/resolved, suggesting resolution of interstitial edema. Mild patchy bilateral lower lobe opacities, likely atelectasis. No definite pleural effusions. No pneumothorax. Cardiomegaly. Midthoracic dextroscoliosis. IMPRESSION: Prior interstitial edema has resolved. Mild patchy bilateral lower lobe opacities, likely atelectasis. Electronically Signed   By: Julian Hy M.D.   On: 06/15/2017 18:06   Dg C-arm 1-60 Min  Result Date: 06/07/2017 CLINICAL DATA:  Right hip fracture fixation EXAM: DG C-ARM 61-120 MIN; OPERATIVE RIGHT HIP WITH PELVIS COMPARISON:  None. FINDINGS: There are 4 C-arm fluoroscopic views provided from a right femoral nail fixation of a comminuted intertrochanteric fracture of the proximal femur with avulsed lesser trochanter. Less varus angulation is currently identified on these images. No evidence of hardware failure or immediate postoperative fracture. Improved alignment is demonstrated though there is still some cephalad displacement of the femoral shaft relative to the femoral neck. IMPRESSION: Status post nail fixation of intertrochanteric comminuted fracture the right femur. Electronically Signed   By: Ashley Royalty M.D.   On: 06/07/2017 23:13   Dg Hip Operative Unilat W Or W/o Pelvis Right  Result Date: 06/07/2017 CLINICAL DATA:  Right hip fracture fixation EXAM: DG C-ARM 61-120 MIN; OPERATIVE RIGHT HIP WITH PELVIS COMPARISON:  None. FINDINGS: There are 4 C-arm fluoroscopic views provided from a right femoral nail fixation of a comminuted intertrochanteric fracture of the proximal femur with avulsed lesser trochanter. Less  varus  angulation is currently identified on these images. No evidence of hardware failure or immediate postoperative fracture. Improved alignment is demonstrated though there is still some cephalad displacement of the femoral shaft relative to the femoral neck. IMPRESSION: Status post nail fixation of intertrochanteric comminuted fracture the right femur. Electronically Signed   By: Ashley Royalty M.D.   On: 06/07/2017 23:13   Dg Hip Unilat  With Pelvis 2-3 Views Right  Result Date: 06/07/2017 CLINICAL DATA:  Followup getting out of bed this morning. Right hip pain. Initial encounter. EXAM: DG HIP (WITH OR WITHOUT PELVIS) 2-3V RIGHT COMPARISON:  None. FINDINGS: Comminuted intertrochanteric fracture of the right hip is seen. No evidence of dislocation. No pelvic fracture identified. Generalized osteopenia noted. Bilateral pelvic surgical clips are seen from previous lymphadenectomy. Peripheral vascular calcification noted in right thigh. IMPRESSION: Comminuted intertrochanteric right hip fracture. Electronically Signed   By: Earle Gell M.D.   On: 06/07/2017 11:43    Assessment/Plan 1. AF (paroxysmal atrial fibrillation) (HCC) -rate controlled except mid-morning -try not using heating pad at that time and see if it affects it -if HR >100 again tomorrow am or anytime, obtain EKG for me to evaluate -holding evening lopressor does not explain b/c he has it even on mornings when he got the lopressor the evening before -these elevated HRs are before his am meds, but his HR is so low the rest of the time, that I don't feel comfortable increasing his beta blocker  2. Chronic diastolic CHF (congestive heart failure) (HCC) -cont prn lasix for weight gain -weight is 133# this am  3. Chronic kidney disease (CKD), stage IV (severe) (HCC) -followed by nephrology at [facility] -prognosis not good considering combination of this and his heart failure and difficulty to control afib rates (sick sinus syndrome?  Underlying)  4. Tachycardia -see #1  Family/ staff Communication: discussed with pt, his wife and his daughter  Labs/tests ordered:  EKG for HR over 100 if recurs  Jaimi Belle L. Willette Mudry, D.O. [facility] [address], [place] [address] Cell Phone (Mon-Fri 8am-5pm):  [phone] On Call:  [phone] & follow prompts after 5pm & weekends Office Phone:  [phone] Office Fax:  [phone]

## 2017-07-02 DIAGNOSIS — R278 Other lack of coordination: Secondary | ICD-10-CM | POA: Diagnosis not present

## 2017-07-02 DIAGNOSIS — M25551 Pain in right hip: Secondary | ICD-10-CM | POA: Diagnosis not present

## 2017-07-02 DIAGNOSIS — M6389 Disorders of muscle in diseases classified elsewhere, multiple sites: Secondary | ICD-10-CM | POA: Diagnosis not present

## 2017-07-02 DIAGNOSIS — R2689 Other abnormalities of gait and mobility: Secondary | ICD-10-CM | POA: Diagnosis not present

## 2017-07-02 DIAGNOSIS — I498 Other specified cardiac arrhythmias: Secondary | ICD-10-CM | POA: Diagnosis not present

## 2017-07-02 DIAGNOSIS — M62551 Muscle wasting and atrophy, not elsewhere classified, right thigh: Secondary | ICD-10-CM | POA: Diagnosis not present

## 2017-07-03 DIAGNOSIS — M25551 Pain in right hip: Secondary | ICD-10-CM | POA: Diagnosis not present

## 2017-07-03 DIAGNOSIS — I498 Other specified cardiac arrhythmias: Secondary | ICD-10-CM | POA: Diagnosis not present

## 2017-07-03 DIAGNOSIS — M6389 Disorders of muscle in diseases classified elsewhere, multiple sites: Secondary | ICD-10-CM | POA: Diagnosis not present

## 2017-07-03 DIAGNOSIS — R278 Other lack of coordination: Secondary | ICD-10-CM | POA: Diagnosis not present

## 2017-07-03 DIAGNOSIS — R2689 Other abnormalities of gait and mobility: Secondary | ICD-10-CM | POA: Diagnosis not present

## 2017-07-03 DIAGNOSIS — M62551 Muscle wasting and atrophy, not elsewhere classified, right thigh: Secondary | ICD-10-CM | POA: Diagnosis not present

## 2017-07-05 DIAGNOSIS — I498 Other specified cardiac arrhythmias: Secondary | ICD-10-CM | POA: Diagnosis not present

## 2017-07-05 DIAGNOSIS — R2689 Other abnormalities of gait and mobility: Secondary | ICD-10-CM | POA: Diagnosis not present

## 2017-07-05 DIAGNOSIS — M6389 Disorders of muscle in diseases classified elsewhere, multiple sites: Secondary | ICD-10-CM | POA: Diagnosis not present

## 2017-07-05 DIAGNOSIS — R278 Other lack of coordination: Secondary | ICD-10-CM | POA: Diagnosis not present

## 2017-07-05 DIAGNOSIS — M25551 Pain in right hip: Secondary | ICD-10-CM | POA: Diagnosis not present

## 2017-07-05 DIAGNOSIS — M62551 Muscle wasting and atrophy, not elsewhere classified, right thigh: Secondary | ICD-10-CM | POA: Diagnosis not present

## 2017-07-06 DIAGNOSIS — M62551 Muscle wasting and atrophy, not elsewhere classified, right thigh: Secondary | ICD-10-CM | POA: Diagnosis not present

## 2017-07-06 DIAGNOSIS — M25551 Pain in right hip: Secondary | ICD-10-CM | POA: Diagnosis not present

## 2017-07-06 DIAGNOSIS — M6389 Disorders of muscle in diseases classified elsewhere, multiple sites: Secondary | ICD-10-CM | POA: Diagnosis not present

## 2017-07-06 DIAGNOSIS — N185 Chronic kidney disease, stage 5: Secondary | ICD-10-CM | POA: Diagnosis not present

## 2017-07-06 DIAGNOSIS — D649 Anemia, unspecified: Secondary | ICD-10-CM | POA: Diagnosis not present

## 2017-07-06 DIAGNOSIS — R2689 Other abnormalities of gait and mobility: Secondary | ICD-10-CM | POA: Diagnosis not present

## 2017-07-06 DIAGNOSIS — I498 Other specified cardiac arrhythmias: Secondary | ICD-10-CM | POA: Diagnosis not present

## 2017-07-06 DIAGNOSIS — R278 Other lack of coordination: Secondary | ICD-10-CM | POA: Diagnosis not present

## 2017-07-06 LAB — BASIC METABOLIC PANEL
BUN: 92 — AB (ref 4–21)
Creatinine: 4.1 — AB (ref 0.6–1.3)
Glucose: 100
Potassium: 4.3 (ref 3.4–5.3)
Sodium: 139 (ref 137–147)

## 2017-07-07 ENCOUNTER — Encounter: Payer: Self-pay | Admitting: Internal Medicine

## 2017-07-07 ENCOUNTER — Non-Acute Institutional Stay (SKILLED_NURSING_FACILITY): Payer: Medicare Other | Admitting: Internal Medicine

## 2017-07-07 DIAGNOSIS — R2689 Other abnormalities of gait and mobility: Secondary | ICD-10-CM | POA: Diagnosis not present

## 2017-07-07 DIAGNOSIS — R05 Cough: Secondary | ICD-10-CM

## 2017-07-07 DIAGNOSIS — M25551 Pain in right hip: Secondary | ICD-10-CM | POA: Diagnosis not present

## 2017-07-07 DIAGNOSIS — I5033 Acute on chronic diastolic (congestive) heart failure: Secondary | ICD-10-CM | POA: Diagnosis not present

## 2017-07-07 DIAGNOSIS — M62551 Muscle wasting and atrophy, not elsewhere classified, right thigh: Secondary | ICD-10-CM | POA: Diagnosis not present

## 2017-07-07 DIAGNOSIS — Z7189 Other specified counseling: Secondary | ICD-10-CM

## 2017-07-07 DIAGNOSIS — R278 Other lack of coordination: Secondary | ICD-10-CM | POA: Diagnosis not present

## 2017-07-07 DIAGNOSIS — I498 Other specified cardiac arrhythmias: Secondary | ICD-10-CM | POA: Diagnosis not present

## 2017-07-07 DIAGNOSIS — N184 Chronic kidney disease, stage 4 (severe): Secondary | ICD-10-CM

## 2017-07-07 DIAGNOSIS — M6389 Disorders of muscle in diseases classified elsewhere, multiple sites: Secondary | ICD-10-CM | POA: Diagnosis not present

## 2017-07-07 DIAGNOSIS — R0989 Other specified symptoms and signs involving the circulatory and respiratory systems: Secondary | ICD-10-CM | POA: Diagnosis not present

## 2017-07-07 DIAGNOSIS — R058 Other specified cough: Secondary | ICD-10-CM

## 2017-07-07 NOTE — Progress Notes (Signed)
Patient ID: Johnny Morrow, male   DOB: 12-21-1919, 83 y.o.   MRN: [id number]  Location:  [facility] Room Number: 158 rehab Place of Service:  SNF (31) Provider:  Alexiah Koroma L. Mariea Clonts, D.O., C.M.D.  Wenda Low, MD  Patient Care Team: Wenda Low, MD as PCP - General (Internal Medicine)  Extended Emergency Contact Information Primary Emergency Contact: Marylin Crosby of [place] Phone: [phone] Relation: Spouse Secondary Emergency Contact: Leanord Hawking [place] of [place] Mobile Phone: [phone] Relation: Daughter  Code Status:  FULL CODE--spent 20 minutes reviewing his chronic illnesses including his renal failure and CHF Class 3 symptoms and prognosis and data for out-of-hospital resuscitation.  Pt insists "I understand.  I want to give it a try."  Advanced Directives 07/07/2017  Does Patient Have a Medical Advance Directive? Yes  Type of Advance Directive [place]  Does patient want to make changes to medical advance directive? No - Patient declined  Copy of [place] in Chart? -   Chief Complaint  Patient presents with  . Acute Visit    dry cough    HPI:  Pt is a 83 y.o. male seen today for an acute visit for dry cough.   BP elevated today.  HR low.  Was high for a period of time again before medication, but improves after am meds given.  Has been determined to be due to variability of times meds administered on shifts. Weights:   11/14 133.2 11/15 133.8 11/16 134.8  11/17 138.4 (lasix given? Should have been) 11/18 136.4  11/19 134.8 11/20 137 (needs lasix today) He was notably dyspneic moreso than usual at rest.  He was coughing.  He says he was exposed to a virus by a caregiver here, but he's had no fever, congestion, sore throat, ear pain, chills, just dry cough.  We also discovered that not all weights were obtained consistently at the same time of day and with  the same attire on him.    Past Medical History:  Diagnosis Date  . Anemia   . Arthritis   . Atrial fibrillation ([place])   . Cataract    "ready to come out on both sides; anytime" (06/16/2017)  . CHF (congestive heart failure) ([place])   . CKD (chronic kidney disease), stage III ([place])    "followed by Dr. Keturah Barre @ [facility]; CKD fluctuates between III and IV" (06/16/2017)  . DVT (deep venous thrombosis) ([facility])    LLE  . History of blood transfusion ~ 01/2017; 05/2017   "blood loss; anemia"  . Hypertension   . Pneumonia 12/2016   "a touch"  . Prostate cancer [facility])     follows with hematologists q 6 months. S/p radical prostatectomy and proton therapy in 2001.  . Radiation cystitis   . Retinal hemorrhage of both eyes    follows with ophthalmologists; "never had OR" (06/16/2017)  . Retroperitoneal bleed ?12/2016   [name] 06/15/2017   Past Surgical History:  Procedure Laterality Date  . FRACTURE SURGERY    . INGUINAL HERNIA REPAIR    . INTRAMEDULLARY (IM) NAIL INTERTROCHANTRIC Right 06/07/2017   Performed by [name], MD at [facility]    . TIBIA FRACTURE SURGERY Left 06/2012  . TONSILLECTOMY      Allergies  Allergen Reactions  . Iodinated Diagnostic Agents Rash  . Ace Inhibitors Other (See Comments)    Noted (on MAR) to be "allergic," but no reaction was cited/noted  .  Shellfish-Derived Products Other (See Comments)    Noted (on MAR) to be "allergic," but no reaction was cited/noted  . Penicillins Rash    Has patient had a PCN reaction causing immediate rash, facial/tongue/throat swelling, SOB or lightheadedness with hypotension: Yes Has patient had a PCN reaction causing severe rash involving mucus membranes or skin necrosis: Unknown Has patient had a PCN reaction that required hospitalization: Unknown Has patient had a PCN reaction occurring within the last 10 years: Unknown If all of the above answers are "NO", then may proceed with Cephalosporin use.     Outpatient  Encounter Medications as of 07/07/2017  Medication Sig  . albuterol (PROVENTIL HFA;VENTOLIN HFA) 108 (90 Base) MCG/ACT inhaler Inhale 2 puffs into the lungs every 6 (six) hours as needed for wheezing or shortness of breath.  Marland Kitchen aspirin EC 81 MG tablet Take 1 tablet (81 mg total) by mouth 2 (two) times daily.  . B Complex Vitamins (VITAMIN-B COMPLEX PO) Take 1 tablet by mouth daily.   . bisacodyl (DULCOLAX) 5 MG EC tablet Take 1 tablet (5 mg total) by mouth daily as needed for moderate constipation.  . Cholecalciferol (VITAMIN D3) 5000 units TABS Take 5,000 Units by mouth daily.   . Coenzyme Q10 (CO Q 10) 100 MG CAPS Take 100 mg by mouth daily.  Marland Kitchen diltiazem (CARDIZEM CD) 360 MG 24 hr capsule Take 1 capsule (360 mg total) daily by mouth.  . doxazosin (CARDURA) 1 MG tablet Take 1 mg by mouth every evening.  . ferrous sulfate 325 (65 FE) MG tablet Take 1 tablet (325 mg total) by mouth daily with breakfast.  . furosemide (LASIX) 40 MG tablet Take 40 mg by mouth daily as needed (IF A WEIGHT GAIN OF 2 POUNDS OR MORE OCCURS).   . isosorbide mononitrate (IMDUR) 60 MG 24 hr tablet Take 0.5 tablets (30 mg total) daily by mouth.  . Melatonin 3 MG TABS Take 1 tablet by mouth at bedtime as needed.  . metoprolol tartrate (LOPRESSOR) 25 MG tablet Take 0.5 tablets (12.5 mg total) 2 (two) times daily by mouth.  . Multiple Vitamin (MULTI VITAMIN PO) Take 1 tablet by mouth daily.   . Omega 3 1000 MG CAPS Take 2,000 mg by mouth daily.  . polyethylene glycol (MIRALAX / GLYCOLAX) packet Take 17 g by mouth daily.  . Probiotic Product (PROBIOTIC PO) Chew 1 tablet (250 million cell) by mouth once a day  . senna (SENOKOT) 8.6 MG TABS tablet Take 1 tablet by mouth every morning.  . traMADol (ULTRAM) 50 MG tablet Take 1 tablet (50 mg total) every 6 (six) hours as needed by mouth for moderate pain.   No facility-administered encounter medications on file as of 07/07/2017.     Review of Systems  Constitutional: Positive  for activity change, appetite change, fatigue and unexpected weight change. Negative for chills and fever.  HENT: Negative for congestion.   Eyes: Negative for visual disturbance.       Glasses  Respiratory: Positive for cough and shortness of breath. Negative for apnea, choking, chest tightness and wheezing.   Cardiovascular: Negative for chest pain, palpitations and leg swelling.  Gastrointestinal: Negative for abdominal pain and constipation.       Bowels moving now  Genitourinary: Negative for dysuria.  Musculoskeletal: Positive for gait problem.       Uses walker, difficulty going around the unit--reports having to stop 2-3 times this am with PT  Skin: Negative for color change.  Neurological: Positive for  weakness. Negative for dizziness.  Hematological: Does not bruise/bleed easily.  Psychiatric/Behavioral: Negative for confusion.    Immunization History  Administered Date(s) Administered  . Influenza-Unspecified 06/02/2012, 04/19/2015  . Pneumococcal Conjugate-13 11/30/2013  . Pneumococcal Polysaccharide-23 05/15/2008  . Tdap 04/29/2011  . Zoster 11/12/2007   Pertinent  Health Maintenance Due  Topic Date Due  . FOOT EXAM  06/26/1932  . OPHTHALMOLOGY EXAM  06/26/1932  . HEMOGLOBIN A1C  03/12/2017  . INFLUENZA VACCINE  03/18/2017  . URINE MICROALBUMIN  08/20/2017  . PNA vac Low Risk Adult  Completed   Fall Risk  12/24/2015  Falls in the past year? No  Risk for fall due to : Other (Comment)  Risk for fall due to: Comment Wears glasses   Functional Status Survey:    Vitals:   07/07/17 1101  BP: (!) 153/77  Pulse: 60  Resp: 20  Temp: 97.8 F (36.6 C)  TempSrc: Oral  SpO2: 94%  Weight: 134 lb (60.8 kg)   Body mass index is 23 kg/m. Physical Exam  Constitutional: He is oriented to person, place, and time.  Cachectic appearing, frail african [place] male resting in recliner chair, dyspneic at rest  HENT:  Head: Normocephalic and atraumatic.  Mouth/Throat:  Oropharynx is clear and moist. No oropharyngeal exudate.  Eyes: Conjunctivae are normal. Pupils are equal, round, and reactive to light.  glasses  Cardiovascular: Normal rate and regular rhythm.  Pulmonary/Chest:  Increased effort, coarse rales bilaterally and dininished breath sounds at left greater than right base  Abdominal: Soft. Bowel sounds are normal. There is no tenderness.  Musculoskeletal: Normal range of motion.  Lymphadenopathy:    He has no cervical adenopathy.  Neurological: He is alert and oriented to person, place, and time.  Skin: Skin is warm and dry.  Psychiatric: He has a normal mood and affect.    Labs reviewed: Recent Labs    06/12/17 0308  06/19/17 0247 06/21/17 0136 06/22/17 0240 06/30/17 0300 07/06/17 0500  NA 136   < > 135 132* 132* 134* 139  K 3.6   < > 3.9 4.0 4.2 4.7 4.3  CL 108   < > 108 106 107  --   --   CO2 18*   < > 18* 16* 18*  --   --   GLUCOSE 124*   < > 119* 121* 116*  --   --   BUN 91*   < > 112* 109* 110* 110* 92*  CREATININE 4.86*   < > 4.43* 4.46* 4.55* 5.1* 4.1*  CALCIUM 8.4*   < > 8.3* 8.3* 8.3*  --   --   PHOS 3.6  --   --   --   --   --   --    < > = values in this interval not displayed.   Recent Labs    07/29/16 08/20/16 1221 06/07/17 1022 06/12/17 0308  AST 18 19 30   --   ALT 8* 9 18  --   ALKPHOS 68 61 62  --   BILITOT  --  0.5 0.9  --   PROT  --  6.3 6.6  --   ALBUMIN  --  3.7 3.3* 2.4*   Recent Labs    06/11/17 0434  06/15/17 1730  06/17/17 0230  06/19/17 0247 06/21/17 0136 06/22/17 0240 06/30/17 0300  WBC 4.9   < > 8.1   < > 4.8   < > 6.8 5.9 6.1 5.1  NEUTROABS 3.0  --  5.9  --  3.0  --   --   --   --   --   HGB 7.9*   < > 7.0*   < > 8.0*   < > 7.9* 7.8* 8.1* 9.1*  HCT 22.3*   < > 20.4*   < > 23.1*   < > 22.6* 22.6* 23.7* 28*  MCV 80.8   < > 82.3   < > 82.5   < > 81.9 82.8 82.0  --   PLT 89*   < > 181   < > 173   < > 207 192 209 166   < > = values in this interval not displayed.   Lab Results    Component Value Date   TSH 4.056 06/16/2017   Lab Results  Component Value Date   HGBA1C 5.8 09/12/2016   Lab Results  Component Value Date   CHOL 211 (A) 07/29/2016   HDL 99 (A) 07/29/2016   LDLCALC 97 07/29/2016   TRIG 76 07/29/2016    Significant Diagnostic Results in last 30 days:  Dg Chest 1 View  Result Date: 06/07/2017 CLINICAL DATA:  Acute right hip fracture. Pre-op respiratory exam. Prostate carcinoma. EXAM: CHEST 1 VIEW COMPARISON:  09/30/2016 FINDINGS: Heart size is stable.  Severe thoracolumbar dextroscoliosis. Diffuse nodular pulmonary interstitial prominence is new since previous study. This is nonspecific, and may be due to pulmonary edema, viral pneumonitis, or possibly metastatic disease. No evidence of pneumothorax or pleural effusion. IMPRESSION: New diffuse nodular pulmonary interstitial prominence, which is nonspecific. Differential considerations include pulmonary edema, viral pneumonitis, or possibly metastatic disease. Recommend clinical correlation, and consider continued chest radiographic followup versus chest CT. Electronically Signed   By: Earle Gell M.D.   On: 06/07/2017 11:49   Nm Pulmonary Vent And Perf (v/q Scan)  Result Date: 06/15/2017 CLINICAL DATA:  Tachycardia and weakness. Recent hip surgery. PE suspected, high pretest prob EXAM: NUCLEAR MEDICINE VENTILATION - PERFUSION LUNG SCAN TECHNIQUE: Ventilation images were obtained in multiple projections using inhaled aerosol Tc-21m DTPA. Perfusion images were obtained in multiple projections after intravenous injection of Tc-82m MAA. RADIOPHARMACEUTICALS:  31.6 mCi Technetium-52m DTPA aerosol inhalation and 4.40 mCi Technetium-25m MAA IV COMPARISON:  Chest radiograph earlier this day FINDINGS: Ventilation: Diffusely heterogeneous, no focal ventilation defect. Perfusion: No wedge shaped peripheral perfusion defects to suggest acute pulmonary embolism. Enlarged cardiac silhouette is noted. Perfusion more  homogeneous on ventilation. IMPRESSION: 1. Low probability for pulmonary embolus. 2. Profusion is more homogeneous than ventilation, which can be seen with airways disease. Electronically Signed   By: Jeb Levering M.D.   On: 06/15/2017 23:22   Dg Chest Portable 1 View  Result Date: 06/15/2017 CLINICAL DATA:  Tachycardia EXAM: PORTABLE CHEST 1 VIEW COMPARISON:  06/07/2017 FINDINGS: Prior interstitial prominence has improved/resolved, suggesting resolution of interstitial edema. Mild patchy bilateral lower lobe opacities, likely atelectasis. No definite pleural effusions. No pneumothorax. Cardiomegaly. Midthoracic dextroscoliosis. IMPRESSION: Prior interstitial edema has resolved. Mild patchy bilateral lower lobe opacities, likely atelectasis. Electronically Signed   By: Julian Hy M.D.   On: 06/15/2017 18:06   Dg C-arm 1-60 Min  Result Date: 06/07/2017 CLINICAL DATA:  Right hip fracture fixation EXAM: DG C-ARM 61-120 MIN; OPERATIVE RIGHT HIP WITH PELVIS COMPARISON:  None. FINDINGS: There are 4 C-arm fluoroscopic views provided from a right femoral nail fixation of a comminuted intertrochanteric fracture of the proximal femur with avulsed lesser trochanter. Less varus angulation is currently identified on these images. No evidence of hardware failure  or immediate postoperative fracture. Improved alignment is demonstrated though there is still some cephalad displacement of the femoral shaft relative to the femoral neck. IMPRESSION: Status post nail fixation of intertrochanteric comminuted fracture the right femur. Electronically Signed   By: Ashley Royalty M.D.   On: 06/07/2017 23:13   Dg Hip Operative Unilat W Or W/o Pelvis Right  Result Date: 06/07/2017 CLINICAL DATA:  Right hip fracture fixation EXAM: DG C-ARM 61-120 MIN; OPERATIVE RIGHT HIP WITH PELVIS COMPARISON:  None. FINDINGS: There are 4 C-arm fluoroscopic views provided from a right femoral nail fixation of a comminuted  intertrochanteric fracture of the proximal femur with avulsed lesser trochanter. Less varus angulation is currently identified on these images. No evidence of hardware failure or immediate postoperative fracture. Improved alignment is demonstrated though there is still some cephalad displacement of the femoral shaft relative to the femoral neck. IMPRESSION: Status post nail fixation of intertrochanteric comminuted fracture the right femur. Electronically Signed   By: Ashley Royalty M.D.   On: 06/07/2017 23:13   Dg Hip Unilat  With Pelvis 2-3 Views Right  Result Date: 06/07/2017 CLINICAL DATA:  Followup getting out of bed this morning. Right hip pain. Initial encounter. EXAM: DG HIP (WITH OR WITHOUT PELVIS) 2-3V RIGHT COMPARISON:  None. FINDINGS: Comminuted intertrochanteric fracture of the right hip is seen. No evidence of dislocation. No pelvic fracture identified. Generalized osteopenia noted. Bilateral pelvic surgical clips are seen from previous lymphadenectomy. Peripheral vascular calcification noted in right thigh. IMPRESSION: Comminuted intertrochanteric right hip fracture. Electronically Signed   By: Earle Gell M.D.   On: 06/07/2017 11:43    Assessment/Plan 1. Dry cough -no signs of viral or bacterial illness -seems due to #2 with increased weight and rales on exam  2. Acute on chronic diastolic CHF (congestive heart failure) ([place]) -give prn lasix due to weight gain, then be consistent with weights to ensure accuracy -f/u bmp  3. Chronic kidney disease (CKD), stage IV (severe) ([facility]) -has amazingly been fairly stable despite diuretic requirements every few days -needs outpatient f/u with [facility] upon d/c from rehab  4. Advance care planning -see top of note, patient adamant about full code status--wants to "give it a try"  Family/ staff Communication: discussed with rehab nurse  Labs/tests ordered:  F/u bmp   Ladarius Seubert L. Rihanna Marseille, D.O. [facility] [address], [place] [address] Cell Phone (Mon-Fri 8am-5pm):  [phone] On Call:  [phone] & follow prompts after 5pm & weekends Office Phone:  [phone] Office Fax:  [phone]

## 2017-07-08 DIAGNOSIS — M62551 Muscle wasting and atrophy, not elsewhere classified, right thigh: Secondary | ICD-10-CM | POA: Diagnosis not present

## 2017-07-08 DIAGNOSIS — M6389 Disorders of muscle in diseases classified elsewhere, multiple sites: Secondary | ICD-10-CM | POA: Diagnosis not present

## 2017-07-08 DIAGNOSIS — M25551 Pain in right hip: Secondary | ICD-10-CM | POA: Diagnosis not present

## 2017-07-08 DIAGNOSIS — R2689 Other abnormalities of gait and mobility: Secondary | ICD-10-CM | POA: Diagnosis not present

## 2017-07-08 DIAGNOSIS — I498 Other specified cardiac arrhythmias: Secondary | ICD-10-CM | POA: Diagnosis not present

## 2017-07-08 DIAGNOSIS — R278 Other lack of coordination: Secondary | ICD-10-CM | POA: Diagnosis not present

## 2017-07-10 DIAGNOSIS — R278 Other lack of coordination: Secondary | ICD-10-CM | POA: Diagnosis not present

## 2017-07-10 DIAGNOSIS — R2689 Other abnormalities of gait and mobility: Secondary | ICD-10-CM | POA: Diagnosis not present

## 2017-07-10 DIAGNOSIS — I498 Other specified cardiac arrhythmias: Secondary | ICD-10-CM | POA: Diagnosis not present

## 2017-07-10 DIAGNOSIS — M62551 Muscle wasting and atrophy, not elsewhere classified, right thigh: Secondary | ICD-10-CM | POA: Diagnosis not present

## 2017-07-10 DIAGNOSIS — M6389 Disorders of muscle in diseases classified elsewhere, multiple sites: Secondary | ICD-10-CM | POA: Diagnosis not present

## 2017-07-10 DIAGNOSIS — M25551 Pain in right hip: Secondary | ICD-10-CM | POA: Diagnosis not present

## 2017-07-11 DIAGNOSIS — R278 Other lack of coordination: Secondary | ICD-10-CM | POA: Diagnosis not present

## 2017-07-11 DIAGNOSIS — R2689 Other abnormalities of gait and mobility: Secondary | ICD-10-CM | POA: Diagnosis not present

## 2017-07-11 DIAGNOSIS — M62551 Muscle wasting and atrophy, not elsewhere classified, right thigh: Secondary | ICD-10-CM | POA: Diagnosis not present

## 2017-07-11 DIAGNOSIS — M6389 Disorders of muscle in diseases classified elsewhere, multiple sites: Secondary | ICD-10-CM | POA: Diagnosis not present

## 2017-07-11 DIAGNOSIS — M25551 Pain in right hip: Secondary | ICD-10-CM | POA: Diagnosis not present

## 2017-07-11 DIAGNOSIS — I498 Other specified cardiac arrhythmias: Secondary | ICD-10-CM | POA: Diagnosis not present

## 2017-07-13 ENCOUNTER — Telehealth: Payer: Self-pay | Admitting: Internal Medicine

## 2017-07-13 DIAGNOSIS — R278 Other lack of coordination: Secondary | ICD-10-CM | POA: Diagnosis not present

## 2017-07-13 DIAGNOSIS — D649 Anemia, unspecified: Secondary | ICD-10-CM | POA: Diagnosis not present

## 2017-07-13 DIAGNOSIS — M25551 Pain in right hip: Secondary | ICD-10-CM | POA: Diagnosis not present

## 2017-07-13 DIAGNOSIS — M6389 Disorders of muscle in diseases classified elsewhere, multiple sites: Secondary | ICD-10-CM | POA: Diagnosis not present

## 2017-07-13 DIAGNOSIS — N184 Chronic kidney disease, stage 4 (severe): Secondary | ICD-10-CM | POA: Diagnosis not present

## 2017-07-13 DIAGNOSIS — I498 Other specified cardiac arrhythmias: Secondary | ICD-10-CM | POA: Diagnosis not present

## 2017-07-13 DIAGNOSIS — R2689 Other abnormalities of gait and mobility: Secondary | ICD-10-CM | POA: Diagnosis not present

## 2017-07-13 DIAGNOSIS — I5022 Chronic systolic (congestive) heart failure: Secondary | ICD-10-CM | POA: Diagnosis not present

## 2017-07-13 DIAGNOSIS — M62551 Muscle wasting and atrophy, not elsewhere classified, right thigh: Secondary | ICD-10-CM | POA: Diagnosis not present

## 2017-07-13 NOTE — Telephone Encounter (Signed)
New message   Pt daughter verbalized that she wants her father to have a referral to see Dr.Allred  And she wants to get a better understanding of Dr.Ross care with her father, she was unaware that she is his cardiologist   To see if he even needs EP physician   Daughter states that her father has a cardiologist in [place] and I informed her that a referral is needed but pt is established  She want rn to call her to help her understand

## 2017-07-13 NOTE — Telephone Encounter (Signed)
Left message for patient's daughter, Dr. Cammy Brochure, to call back. (DPR)

## 2017-07-14 DIAGNOSIS — R2689 Other abnormalities of gait and mobility: Secondary | ICD-10-CM | POA: Diagnosis not present

## 2017-07-14 DIAGNOSIS — M25551 Pain in right hip: Secondary | ICD-10-CM | POA: Diagnosis not present

## 2017-07-14 DIAGNOSIS — M6389 Disorders of muscle in diseases classified elsewhere, multiple sites: Secondary | ICD-10-CM | POA: Diagnosis not present

## 2017-07-14 DIAGNOSIS — I498 Other specified cardiac arrhythmias: Secondary | ICD-10-CM | POA: Diagnosis not present

## 2017-07-14 DIAGNOSIS — R278 Other lack of coordination: Secondary | ICD-10-CM | POA: Diagnosis not present

## 2017-07-14 DIAGNOSIS — M62551 Muscle wasting and atrophy, not elsewhere classified, right thigh: Secondary | ICD-10-CM | POA: Diagnosis not present

## 2017-07-15 DIAGNOSIS — M62551 Muscle wasting and atrophy, not elsewhere classified, right thigh: Secondary | ICD-10-CM | POA: Diagnosis not present

## 2017-07-15 DIAGNOSIS — M25551 Pain in right hip: Secondary | ICD-10-CM | POA: Diagnosis not present

## 2017-07-15 DIAGNOSIS — I498 Other specified cardiac arrhythmias: Secondary | ICD-10-CM | POA: Diagnosis not present

## 2017-07-15 DIAGNOSIS — R2689 Other abnormalities of gait and mobility: Secondary | ICD-10-CM | POA: Diagnosis not present

## 2017-07-15 DIAGNOSIS — R278 Other lack of coordination: Secondary | ICD-10-CM | POA: Diagnosis not present

## 2017-07-15 DIAGNOSIS — M6389 Disorders of muscle in diseases classified elsewhere, multiple sites: Secondary | ICD-10-CM | POA: Diagnosis not present

## 2017-07-16 DIAGNOSIS — I498 Other specified cardiac arrhythmias: Secondary | ICD-10-CM | POA: Diagnosis not present

## 2017-07-16 DIAGNOSIS — M62551 Muscle wasting and atrophy, not elsewhere classified, right thigh: Secondary | ICD-10-CM | POA: Diagnosis not present

## 2017-07-16 DIAGNOSIS — R2689 Other abnormalities of gait and mobility: Secondary | ICD-10-CM | POA: Diagnosis not present

## 2017-07-16 DIAGNOSIS — R278 Other lack of coordination: Secondary | ICD-10-CM | POA: Diagnosis not present

## 2017-07-16 DIAGNOSIS — M6389 Disorders of muscle in diseases classified elsewhere, multiple sites: Secondary | ICD-10-CM | POA: Diagnosis not present

## 2017-07-16 DIAGNOSIS — M25551 Pain in right hip: Secondary | ICD-10-CM | POA: Diagnosis not present

## 2017-07-17 DIAGNOSIS — R2689 Other abnormalities of gait and mobility: Secondary | ICD-10-CM | POA: Diagnosis not present

## 2017-07-17 DIAGNOSIS — M6389 Disorders of muscle in diseases classified elsewhere, multiple sites: Secondary | ICD-10-CM | POA: Diagnosis not present

## 2017-07-17 DIAGNOSIS — M62551 Muscle wasting and atrophy, not elsewhere classified, right thigh: Secondary | ICD-10-CM | POA: Diagnosis not present

## 2017-07-17 DIAGNOSIS — I498 Other specified cardiac arrhythmias: Secondary | ICD-10-CM | POA: Diagnosis not present

## 2017-07-17 DIAGNOSIS — M25551 Pain in right hip: Secondary | ICD-10-CM | POA: Diagnosis not present

## 2017-07-17 DIAGNOSIS — R278 Other lack of coordination: Secondary | ICD-10-CM | POA: Diagnosis not present

## 2017-07-18 DIAGNOSIS — I498 Other specified cardiac arrhythmias: Secondary | ICD-10-CM | POA: Diagnosis not present

## 2017-07-18 DIAGNOSIS — Z4789 Encounter for other orthopedic aftercare: Secondary | ICD-10-CM | POA: Diagnosis not present

## 2017-07-18 DIAGNOSIS — R278 Other lack of coordination: Secondary | ICD-10-CM | POA: Diagnosis not present

## 2017-07-18 DIAGNOSIS — M6389 Disorders of muscle in diseases classified elsewhere, multiple sites: Secondary | ICD-10-CM | POA: Diagnosis not present

## 2017-07-18 DIAGNOSIS — Z9181 History of falling: Secondary | ICD-10-CM | POA: Diagnosis not present

## 2017-07-18 DIAGNOSIS — M25551 Pain in right hip: Secondary | ICD-10-CM | POA: Diagnosis not present

## 2017-07-19 ENCOUNTER — Emergency Department (HOSPITAL_COMMUNITY): Payer: Medicare Other

## 2017-07-19 ENCOUNTER — Inpatient Hospital Stay (HOSPITAL_COMMUNITY)
Admission: EM | Admit: 2017-07-19 | Discharge: 2017-07-25 | DRG: 291 | Disposition: A | Discharge status: Home Health | Payer: Medicare Other | Attending: Internal Medicine | Admitting: Internal Medicine

## 2017-07-19 ENCOUNTER — Encounter (HOSPITAL_COMMUNITY): Payer: Self-pay | Admitting: Emergency Medicine

## 2017-07-19 DIAGNOSIS — D631 Anemia in chronic kidney disease: Secondary | ICD-10-CM | POA: Diagnosis not present

## 2017-07-19 DIAGNOSIS — I10 Essential (primary) hypertension: Secondary | ICD-10-CM | POA: Diagnosis present

## 2017-07-19 DIAGNOSIS — J189 Pneumonia, unspecified organism: Secondary | ICD-10-CM | POA: Diagnosis not present

## 2017-07-19 DIAGNOSIS — M810 Age-related osteoporosis without current pathological fracture: Secondary | ICD-10-CM | POA: Diagnosis present

## 2017-07-19 DIAGNOSIS — Z87891 Personal history of nicotine dependence: Secondary | ICD-10-CM | POA: Diagnosis not present

## 2017-07-19 DIAGNOSIS — Z79899 Other long term (current) drug therapy: Secondary | ICD-10-CM

## 2017-07-19 DIAGNOSIS — D649 Anemia, unspecified: Secondary | ICD-10-CM | POA: Diagnosis not present

## 2017-07-19 DIAGNOSIS — R911 Solitary pulmonary nodule: Secondary | ICD-10-CM | POA: Diagnosis present

## 2017-07-19 DIAGNOSIS — R7989 Other specified abnormal findings of blood chemistry: Secondary | ICD-10-CM | POA: Diagnosis not present

## 2017-07-19 DIAGNOSIS — Z91013 Allergy to seafood: Secondary | ICD-10-CM

## 2017-07-19 DIAGNOSIS — Z8546 Personal history of malignant neoplasm of prostate: Secondary | ICD-10-CM | POA: Diagnosis not present

## 2017-07-19 DIAGNOSIS — I350 Nonrheumatic aortic (valve) stenosis: Secondary | ICD-10-CM | POA: Diagnosis present

## 2017-07-19 DIAGNOSIS — Z923 Personal history of irradiation: Secondary | ICD-10-CM

## 2017-07-19 DIAGNOSIS — R069 Unspecified abnormalities of breathing: Secondary | ICD-10-CM | POA: Diagnosis not present

## 2017-07-19 DIAGNOSIS — I480 Paroxysmal atrial fibrillation: Secondary | ICD-10-CM | POA: Diagnosis present

## 2017-07-19 DIAGNOSIS — I130 Hypertensive heart and chronic kidney disease with heart failure and stage 1 through stage 4 chronic kidney disease, or unspecified chronic kidney disease: Principal | ICD-10-CM | POA: Diagnosis present

## 2017-07-19 DIAGNOSIS — R0602 Shortness of breath: Secondary | ICD-10-CM | POA: Diagnosis not present

## 2017-07-19 DIAGNOSIS — N184 Chronic kidney disease, stage 4 (severe): Secondary | ICD-10-CM | POA: Diagnosis not present

## 2017-07-19 DIAGNOSIS — I5032 Chronic diastolic (congestive) heart failure: Secondary | ICD-10-CM | POA: Diagnosis not present

## 2017-07-19 DIAGNOSIS — I5033 Acute on chronic diastolic (congestive) heart failure: Secondary | ICD-10-CM | POA: Diagnosis not present

## 2017-07-19 DIAGNOSIS — N179 Acute kidney failure, unspecified: Secondary | ICD-10-CM | POA: Diagnosis not present

## 2017-07-19 DIAGNOSIS — E876 Hypokalemia: Secondary | ICD-10-CM | POA: Diagnosis present

## 2017-07-19 DIAGNOSIS — Z86718 Personal history of other venous thrombosis and embolism: Secondary | ICD-10-CM | POA: Diagnosis not present

## 2017-07-19 DIAGNOSIS — J9601 Acute respiratory failure with hypoxia: Secondary | ICD-10-CM | POA: Diagnosis present

## 2017-07-19 DIAGNOSIS — I361 Nonrheumatic tricuspid (valve) insufficiency: Secondary | ICD-10-CM | POA: Diagnosis present

## 2017-07-19 DIAGNOSIS — Z7982 Long term (current) use of aspirin: Secondary | ICD-10-CM

## 2017-07-19 DIAGNOSIS — Z91041 Radiographic dye allergy status: Secondary | ICD-10-CM

## 2017-07-19 DIAGNOSIS — E785 Hyperlipidemia, unspecified: Secondary | ICD-10-CM | POA: Diagnosis present

## 2017-07-19 DIAGNOSIS — Z9079 Acquired absence of other genital organ(s): Secondary | ICD-10-CM

## 2017-07-19 DIAGNOSIS — R748 Abnormal levels of other serum enzymes: Secondary | ICD-10-CM | POA: Diagnosis present

## 2017-07-19 DIAGNOSIS — Z888 Allergy status to other drugs, medicaments and biological substances status: Secondary | ICD-10-CM

## 2017-07-19 DIAGNOSIS — Y95 Nosocomial condition: Secondary | ICD-10-CM | POA: Diagnosis present

## 2017-07-19 DIAGNOSIS — D696 Thrombocytopenia, unspecified: Secondary | ICD-10-CM | POA: Diagnosis present

## 2017-07-19 DIAGNOSIS — Z79891 Long term (current) use of opiate analgesic: Secondary | ICD-10-CM | POA: Diagnosis not present

## 2017-07-19 DIAGNOSIS — Z880 Allergy status to penicillin: Secondary | ICD-10-CM

## 2017-07-19 DIAGNOSIS — Z8249 Family history of ischemic heart disease and other diseases of the circulatory system: Secondary | ICD-10-CM

## 2017-07-19 DIAGNOSIS — E8770 Fluid overload, unspecified: Secondary | ICD-10-CM | POA: Diagnosis not present

## 2017-07-19 DIAGNOSIS — I129 Hypertensive chronic kidney disease with stage 1 through stage 4 chronic kidney disease, or unspecified chronic kidney disease: Secondary | ICD-10-CM | POA: Diagnosis not present

## 2017-07-19 LAB — BASIC METABOLIC PANEL
ANION GAP: 9 (ref 5–15)
BUN: 89 mg/dL — ABNORMAL HIGH (ref 6–20)
CO2: 22 mmol/L (ref 22–32)
Calcium: 8.6 mg/dL — ABNORMAL LOW (ref 8.9–10.3)
Chloride: 108 mmol/L (ref 101–111)
Creatinine, Ser: 4.3 mg/dL — ABNORMAL HIGH (ref 0.61–1.24)
GFR calc Af Amer: 12 mL/min — ABNORMAL LOW (ref >60)
GFR, EST NON AFRICAN AMERICAN: 11 mL/min — AB (ref >60)
GLUCOSE: 166 mg/dL — AB (ref 65–99)
POTASSIUM: 4.2 mmol/L (ref 3.5–5.1)
Sodium: 139 mmol/L (ref 135–145)

## 2017-07-19 LAB — CREATININE, SERUM
Creatinine, Ser: 4.4 mg/dL — ABNORMAL HIGH (ref 0.61–1.24)
GFR calc non Af Amer: 10 mL/min — ABNORMAL LOW (ref >60)
GFR, EST AFRICAN AMERICAN: 12 mL/min — AB (ref >60)

## 2017-07-19 LAB — CBC WITH DIFFERENTIAL/PLATELET
Basophils Absolute: 0 10*3/uL (ref 0.0–0.1)
Basophils Relative: 0 %
Eosinophils Absolute: 0.1 10*3/uL (ref 0.0–0.7)
Eosinophils Relative: 2 %
HCT: 27.2 % — ABNORMAL LOW (ref 39.0–52.0)
Hemoglobin: 9.1 g/dL — ABNORMAL LOW (ref 13.0–17.0)
Lymphocytes Relative: 31 %
Lymphs Abs: 1.8 10*3/uL (ref 0.7–4.0)
MCH: 28.3 pg (ref 26.0–34.0)
MCHC: 33.5 g/dL (ref 30.0–36.0)
MCV: 84.5 fL (ref 78.0–100.0)
Monocytes Absolute: 0.6 10*3/uL (ref 0.1–1.0)
Monocytes Relative: 10 %
Neutro Abs: 3.3 10*3/uL (ref 1.7–7.7)
Neutrophils Relative %: 57 %
Platelets: 85 10*3/uL — ABNORMAL LOW (ref 150–400)
RBC: 3.22 MIL/uL — ABNORMAL LOW (ref 4.22–5.81)
RDW: 15.2 % (ref 11.5–15.5)
WBC: 5.9 10*3/uL (ref 4.0–10.5)

## 2017-07-19 LAB — CBC
HEMATOCRIT: 25.6 % — AB (ref 39.0–52.0)
HEMOGLOBIN: 8.5 g/dL — AB (ref 13.0–17.0)
MCH: 27.7 pg (ref 26.0–34.0)
MCHC: 33.2 g/dL (ref 30.0–36.0)
MCV: 83.4 fL (ref 78.0–100.0)
Platelets: 83 10*3/uL — ABNORMAL LOW (ref 150–400)
RBC: 3.07 MIL/uL — ABNORMAL LOW (ref 4.22–5.81)
RDW: 15.1 % (ref 11.5–15.5)
WBC: 4.7 10*3/uL (ref 4.0–10.5)

## 2017-07-19 LAB — STREP PNEUMONIAE URINARY ANTIGEN: Strep Pneumo Urinary Antigen: NEGATIVE

## 2017-07-19 LAB — BRAIN NATRIURETIC PEPTIDE: B Natriuretic Peptide: 2324.6 pg/mL — ABNORMAL HIGH (ref 0.0–100.0)

## 2017-07-19 LAB — TROPONIN I
TROPONIN I: 0.05 ng/mL — AB (ref <0.03)
Troponin I: 0.04 ng/mL

## 2017-07-19 MED ORDER — ACETAMINOPHEN 325 MG PO TABS: 650.0000 mg | ORAL_TABLET | Freq: Four times a day (QID) | ORAL | Status: DC | PRN

## 2017-07-19 MED ORDER — DEXTROSE 5 % IV SOLN
1.0000 g | Freq: Once | INTRAVENOUS | Status: AC
  Administered 2017-07-19: 1 g via INTRAVENOUS
  Filled 2017-07-19 (×2): qty 1

## 2017-07-19 MED ORDER — DOXAZOSIN MESYLATE 1 MG PO TABS
1.0000 mg | ORAL_TABLET | Freq: Every evening | ORAL | Status: DC
  Administered 2017-07-19 – 2017-07-24 (×6): 1 mg via ORAL
  Filled 2017-07-19 (×7): qty 1

## 2017-07-19 MED ORDER — POLYETHYLENE GLYCOL 3350 17 G PO PACK
34.0000 g | PACK | Freq: Every day | ORAL | Status: DC
  Administered 2017-07-20 – 2017-07-23 (×4): 34 g via ORAL
  Filled 2017-07-19 (×6): qty 2

## 2017-07-19 MED ORDER — HEPARIN SODIUM (PORCINE) 5000 UNIT/ML IJ SOLN
5000.0000 [IU] | Freq: Three times a day (TID) | INTRAMUSCULAR | Status: DC
  Administered 2017-07-19 – 2017-07-20 (×2): 5000 [IU] via SUBCUTANEOUS
  Filled 2017-07-19 (×2): qty 1

## 2017-07-19 MED ORDER — DILTIAZEM HCL ER COATED BEADS 360 MG PO CP24
360.0000 mg | ORAL_CAPSULE | Freq: Every day | ORAL | Status: DC
  Administered 2017-07-20 – 2017-07-25 (×6): 360 mg via ORAL
  Filled 2017-07-19: qty 1
  Filled 2017-07-19 (×3): qty 2
  Filled 2017-07-19: qty 1
  Filled 2017-07-19 (×2): qty 2
  Filled 2017-07-19: qty 1
  Filled 2017-07-19: qty 2
  Filled 2017-07-19 (×3): qty 1

## 2017-07-19 MED ORDER — ASPIRIN EC 81 MG PO TBEC
81.0000 mg | DELAYED_RELEASE_TABLET | Freq: Two times a day (BID) | ORAL | Status: DC
  Administered 2017-07-20 – 2017-07-25 (×11): 81 mg via ORAL
  Filled 2017-07-19 (×11): qty 1

## 2017-07-19 MED ORDER — OMEGA 3 1000 MG PO CAPS: 2000.0000 mg | ORAL_CAPSULE | Freq: Every day | ORAL | Status: DC

## 2017-07-19 MED ORDER — ISOSORBIDE MONONITRATE ER 30 MG PO TB24
30.0000 mg | ORAL_TABLET | Freq: Every day | ORAL | Status: DC
  Administered 2017-07-20 – 2017-07-25 (×6): 30 mg via ORAL
  Filled 2017-07-19 (×6): qty 1

## 2017-07-19 MED ORDER — ONDANSETRON HCL 4 MG PO TABS: 4.0000 mg | ORAL_TABLET | Freq: Four times a day (QID) | ORAL | Status: DC | PRN

## 2017-07-19 MED ORDER — VANCOMYCIN HCL 10 G IV SOLR
1250.0000 mg | Freq: Once | INTRAVENOUS | Status: AC
  Administered 2017-07-19: 1250 mg via INTRAVENOUS
  Filled 2017-07-19 (×2): qty 1250

## 2017-07-19 MED ORDER — VITAMIN D 1000 UNITS PO TABS
5000.0000 [IU] | ORAL_TABLET | Freq: Every day | ORAL | Status: DC
  Administered 2017-07-20 – 2017-07-25 (×6): 5000 [IU] via ORAL
  Filled 2017-07-19 (×6): qty 5

## 2017-07-19 MED ORDER — ONDANSETRON HCL 4 MG/2ML IJ SOLN: 4.0000 mg | Freq: Four times a day (QID) | INTRAMUSCULAR | Status: DC | PRN

## 2017-07-19 MED ORDER — LEVALBUTEROL HCL 0.63 MG/3ML IN NEBU: 0.6300 mg | INHALATION_SOLUTION | Freq: Four times a day (QID) | RESPIRATORY_TRACT | Status: DC | PRN

## 2017-07-19 MED ORDER — ACETAMINOPHEN 650 MG RE SUPP: 650.0000 mg | Freq: Four times a day (QID) | RECTAL | Status: DC | PRN

## 2017-07-19 MED ORDER — METOPROLOL TARTRATE 12.5 MG HALF TABLET
12.5000 mg | ORAL_TABLET | Freq: Two times a day (BID) | ORAL | Status: DC
  Administered 2017-07-19 – 2017-07-25 (×12): 12.5 mg via ORAL
  Filled 2017-07-19 (×12): qty 1

## 2017-07-19 MED ORDER — MELATONIN 3 MG PO TABS
9.0000 mg | ORAL_TABLET | Freq: Every day | ORAL | Status: DC
  Administered 2017-07-19 – 2017-07-24 (×6): 9 mg via ORAL
  Filled 2017-07-19 (×7): qty 3

## 2017-07-19 MED ORDER — DEXTROSE 5 % IV SOLN
500.0000 mg | INTRAVENOUS | Status: DC
  Administered 2017-07-20 – 2017-07-22 (×3): 500 mg via INTRAVENOUS
  Filled 2017-07-19 (×8): qty 0.5

## 2017-07-19 MED ORDER — VANCOMYCIN HCL IN DEXTROSE 750-5 MG/150ML-% IV SOLN
750.0000 mg | INTRAVENOUS | Status: DC
  Filled 2017-07-19: qty 150

## 2017-07-19 MED ORDER — BISACODYL 5 MG PO TBEC: 5.0000 mg | DELAYED_RELEASE_TABLET | Freq: Every day | ORAL | Status: DC | PRN

## 2017-07-19 MED ORDER — FERROUS SULFATE 325 (65 FE) MG PO TABS
325.0000 mg | ORAL_TABLET | Freq: Every day | ORAL | Status: DC
  Administered 2017-07-20 – 2017-07-21 (×2): 325 mg via ORAL
  Filled 2017-07-19 (×2): qty 1

## 2017-07-19 MED ORDER — SENNA 8.6 MG PO TABS
1.0000 | ORAL_TABLET | Freq: Every morning | ORAL | Status: DC
  Administered 2017-07-20 – 2017-07-23 (×3): 8.6 mg via ORAL
  Filled 2017-07-19 (×6): qty 1

## 2017-07-19 MED ORDER — OMEGA-3-ACID ETHYL ESTERS 1 G PO CAPS
2.0000 g | ORAL_CAPSULE | Freq: Every day | ORAL | Status: DC
Start: 2017-07-20 — End: 2017-07-25
  Administered 2017-07-20 – 2017-07-25 (×6): 2 g via ORAL
  Filled 2017-07-19 (×6): qty 2

## 2017-07-19 MED ORDER — CO Q 10 100 MG PO CAPS: 100.0000 mg | ORAL_CAPSULE | Freq: Every day | ORAL | Status: DC

## 2017-07-19 MED ORDER — TRAMADOL HCL 50 MG PO TABS: 50.0000 mg | ORAL_TABLET | Freq: Four times a day (QID) | ORAL | Status: DC | PRN

## 2017-07-19 NOTE — H&P (Signed)
History and Physical    Johnny Morrow BHA:[id number] DOB: [birth year] DOA: 07/19/2017  PCP: Wenda Low, MD  Patient coming from: Skilled nursing facility.  Chief Complaint: Shortness of breath.  HPI: Johnny Morrow is a 83 y.o. male with history of diastolic CHF last EF measured was 65-70% with grade 1 diastolic dysfunction, paroxysmal atrial fibrillation not on anticoagulation secondary to severe retroperitoneal bleed, anemia, hypertension, chronic DVT who was admitted last month for atrial flutter and prior to that few days ago was also admitted for hip fracture was brought to the ER patient has been having increasing shortness of breath.  As per the patient patient was treated for pneumonia last week and had taken 7 days of antibiotics.  Antibiotic course was over 3 days ago despite which patient was still coughing and short of breath even at rest.  Denies any chest pain fever or chills.  As per the wife patient's shortness of breath became more acutely this afternoon.  ED Course: In the ER patient chest x-ray shows congestion and possible pneumonia.  BNP was 2300 troponin was negative.  EKG was showing atrial flutter.  Patient admitted for management of healthcare associated pneumonia with possible CHF.  Review of Systems: As per HPI, rest all negative.   Past Medical History:  Diagnosis Date  . Anemia   . Arthritis   . Atrial fibrillation ([place])   . Cataract    "ready to come out on both sides; anytime" (06/16/2017)  . CHF (congestive heart failure) ([place])   . CKD (chronic kidney disease), stage III ([place])    "followed by Dr. Keturah Barre @ [facility]; CKD fluctuates between III and IV" (06/16/2017)  . DVT (deep venous thrombosis) ([facility])    LLE  . History of blood transfusion ~ 01/2017; 05/2017   "blood loss; anemia"  . Hypertension   . Pneumonia 12/2016   "a touch"  . Prostate cancer [facility])     follows with hematologists q 6 months. S/p radical prostatectomy and proton therapy in 2001.  . Radiation  cystitis   . Retinal hemorrhage of both eyes    follows with ophthalmologists; "never had OR" (06/16/2017)  . Retroperitoneal bleed ?12/2016   [name] 06/15/2017    Past Surgical History:  Procedure Laterality Date  . FRACTURE SURGERY    . INGUINAL HERNIA REPAIR    . INTRAMEDULLARY (IM) NAIL INTERTROCHANTERIC Right 06/07/2017   Procedure: INTRAMEDULLARY (IM) NAIL INTERTROCHANTRIC;  Surgeon: [name], MD;  Location: [place];  Service: Orthopedics;  Laterality: Right;  . PROSTATECTOMY    . TIBIA FRACTURE SURGERY Left 06/2012  . TONSILLECTOMY       reports that he quit smoking about 71 years ago. His smoking use included cigarettes. He quit after 10.00 years of use. he has never used smokeless tobacco. He reports that he drinks about 0.6 oz of alcohol per week. He reports that he does not use drugs.  Allergies  Allergen Reactions  . Iodinated Diagnostic Agents Rash  . Ace Inhibitors Other (See Comments)    Noted (on MAR) to be "allergic," but no reaction was cited/noted  . Shellfish-Derived Products Other (See Comments)    Noted (on MAR) to be "allergic," but no reaction was cited/noted  . Penicillins Rash    Has patient had a PCN reaction causing immediate rash, facial/tongue/throat swelling, SOB or lightheadedness with hypotension: Yes Has patient had a PCN reaction causing severe rash involving mucus membranes or skin necrosis: Unknown Has patient had a PCN reaction that required  hospitalization: Unknown Has patient had a PCN reaction occurring within the last 10 years: Unknown If all of the above answers are "NO", then may proceed with Cephalosporin use.     Family History  Problem Relation Age of Onset  . COPD Brother   . Heart attack Brother   . Cancer Father   . Heart attack Father   . Diabetes Mother     Prior to Admission medications   Medication Sig Start Date End Date Taking? Authorizing Provider  albuterol (PROVENTIL HFA;VENTOLIN HFA) 108 (90 Base) MCG/ACT  inhaler Inhale 2 puffs into the lungs every 6 (six) hours as needed for wheezing or shortness of breath. 08/20/16  Yes Martinique, Betty G, MD  aspirin EC 81 MG tablet Take 1 tablet (81 mg total) by mouth 2 (two) times daily. 06/08/17  Yes Corky Sing, PA-C  B Complex Vitamins (VITAMIN-B COMPLEX PO) Take 1 tablet by mouth daily.    Yes [provider]  bisacodyl (DULCOLAX) 5 MG EC tablet Take 1 tablet (5 mg total) by mouth daily as needed for moderate constipation. 06/12/17  Yes Nita Sells, MD  Cholecalciferol (VITAMIN D3) 5000 units TABS Take 5,000 Units by mouth daily.    Yes [provider]  Coenzyme Q10 (CO Q 10) 100 MG CAPS Take 100 mg by mouth daily.   Yes [provider]  diltiazem (CARDIZEM CD) 360 MG 24 hr capsule Take 1 capsule (360 mg total) daily by mouth. 06/23/17  Yes Gherghe, Vella Redhead, MD  doxazosin (CARDURA) 1 MG tablet Take 1 mg by mouth every evening.   Yes [provider]  ferrous sulfate 325 (65 FE) MG tablet Take 1 tablet (325 mg total) by mouth daily with breakfast. 12/22/16  Yes Martinique, Betty G, MD  furosemide (LASIX) 40 MG tablet Take 40 mg by mouth daily as needed (IF A WEIGHT GAIN OF 2 POUNDS OR MORE OCCURS).    Yes [provider]  isosorbide mononitrate (IMDUR) 60 MG 24 hr tablet Take 0.5 tablets (30 mg total) daily by mouth. 06/22/17  Yes Caren Griffins, MD  levalbuterol Penne Lash) 0.63 MG/3ML nebulizer solution Take 0.63 mg by nebulization at bedtime as needed for wheezing or shortness of breath.   Yes [provider]  Melatonin 3 MG TABS Take 10 mg by mouth at bedtime.    Yes [provider]  metoprolol tartrate (LOPRESSOR) 25 MG tablet Take 0.5 tablets (12.5 mg total) 2 (two) times daily by mouth. Patient taking differently: Take 12.5 mg by mouth 2 (two) times daily. Hold for HR < 50 BPM or SBP < 90 06/22/17  Yes Gherghe, Vella Redhead, MD  Multiple Vitamin (MULTI VITAMIN PO) Take 1 tablet by mouth daily.     Yes [provider]  Omega 3 1000 MG CAPS Take 2,000 mg by mouth daily.   Yes [provider]  polyethylene glycol (MIRALAX / GLYCOLAX) packet Take 17 g by mouth daily. Patient taking differently: Take 34 g by mouth daily.  06/12/17  Yes Nita Sells, MD  Probiotic Product (PROBIOTIC PO) Chew 1 tablet (250 million cell) by mouth once a day   Yes [provider]  senna (SENOKOT) 8.6 MG TABS tablet Take 1 tablet by mouth every morning.   Yes [provider]  traMADol (ULTRAM) 50 MG tablet Take 1 tablet (50 mg total) every 6 (six) hours as needed by mouth for moderate pain. 06/22/17  Yes Caren Griffins, MD    Physical Exam:  Vitals:   07/19/17 1830 07/19/17 1845 07/19/17 1900 07/19/17 1915  BP: (!) 166/68 121/85 129/73 132/85  Pulse: 62 (!) 52 (!) 51 (!) 50  Resp: 20 (!) 26 (!) 31 20  Temp:      TempSrc:      SpO2: 98% 99% 98% 97%      Constitutional: Moderately built and nourished. Vitals:   07/19/17 1830 07/19/17 1845 07/19/17 1900 07/19/17 1915  BP: (!) 166/68 121/85 129/73 132/85  Pulse: 62 (!) 52 (!) 51 (!) 50  Resp: 20 (!) 26 (!) 31 20  Temp:      TempSrc:      SpO2: 98% 99% 98% 97%   Eyes: Anicteric no pallor. ENMT: No discharge from the ears eyes nose or mouth. Neck: JVD elevated no mass felt. Respiratory: No rhonchi or crepitations. Cardiovascular: S1-S2 heard no murmurs appreciated. Abdomen: Soft nontender bowel sounds present. Musculoskeletal: No edema.  No joint effusion. Skin: No rash.  Skin appears warm. Neurologic: Alert awake oriented to time place and person.  Moves all extremities. Psychiatric: Appears normal.  Normal affect.   Labs on Admission: I have personally reviewed following labs and imaging studies  CBC: Recent Labs  Lab 07/19/17 1546  WBC 5.9  NEUTROABS 3.3  HGB 9.1*  HCT 27.2*  MCV 84.5  PLT 85*   Basic Metabolic Panel: Recent Labs  Lab 07/19/17 1546  NA 139  K 4.2  CL 108  CO2 22   GLUCOSE 166*  BUN 89*  CREATININE 4.30*  CALCIUM 8.6*   GFR: Estimated Creatinine Clearance: 8.6 mL/min (A) (by C-G formula based on SCr of 4.3 mg/dL (H)). Liver Function Tests: No results for input(s): AST, ALT, ALKPHOS, BILITOT, PROT, ALBUMIN in the last 168 hours. No results for input(s): LIPASE, AMYLASE in the last 168 hours. No results for input(s): AMMONIA in the last 168 hours. Coagulation Profile: No results for input(s): INR, PROTIME in the last 168 hours. Cardiac Enzymes: Recent Labs  Lab 07/19/17 1546  TROPONINI 0.04*   BNP (last 3 results) Recent Labs    08/20/16 1221 12/22/16 1453  PROBNP 373.0* 999.0*   HbA1C: No results for input(s): HGBA1C in the last 72 hours. CBG: No results for input(s): GLUCAP in the last 168 hours. Lipid Profile: No results for input(s): CHOL, HDL, LDLCALC, TRIG, CHOLHDL, LDLDIRECT in the last 72 hours. Thyroid Function Tests: No results for input(s): TSH, T4TOTAL, FREET4, T3FREE, THYROIDAB in the last 72 hours. Anemia Panel: No results for input(s): VITAMINB12, FOLATE, FERRITIN, TIBC, IRON, RETICCTPCT in the last 72 hours. Urine analysis:    Component Value Date/Time   COLORURINE YELLOW 06/07/2017 [place] 06/07/2017 1313   LABSPEC 1.013 06/07/2017 1313   PHURINE 5.0 06/07/2017 1313   GLUCOSEU NEGATIVE 06/07/2017 1313   HGBUR NEGATIVE 06/07/2017 1313   BILIRUBINUR NEGATIVE 06/07/2017 1313   KETONESUR NEGATIVE 06/07/2017 1313   PROTEINUR 100 (A) 06/07/2017 1313   NITRITE NEGATIVE 06/07/2017 1313   LEUKOCYTESUR NEGATIVE 06/07/2017 1313   Sepsis Labs: @LABRCNTIP (procalcitonin:4,lacticidven:4) )No results found for this or any previous visit (from the past 240 hour(s)).   Radiological Exams on Admission: Dg Chest Portable 1 View  Result Date: 07/19/2017 CLINICAL DATA:  Acute shortness of breath and wheezing. EXAM: PORTABLE CHEST 1 VIEW COMPARISON:  06/15/2017 and prior chest radiograph FINDINGS:  Cardiomegaly and pulmonary vascular congestion noted Increased bilateral lower lung opacities/ airspace disease noted. Probable trace right pleural effusion noted. There is no evidence of pneumothorax or acute  bony abnormality. IMPRESSION: Increasing bilateral lower lung opacities -question atelectasis and/or airspace disease/ pneumonia. Probable trace right pleural effusion. Cardiomegaly and pulmonary vascular congestion. Electronically Signed   By: Margarette Canada M.D.   On: 07/19/2017 16:10    EKG: Independently reviewed.  Atrial flutter rate controlled.  Assessment/Plan Principal Problem:   HCAP (healthcare-associated pneumonia) Active Problems:   Essential hypertension, benign   Chronic kidney disease (CKD), stage IV (severe) ([facility])   AF (paroxysmal atrial fibrillation) ([facility])   Chronic diastolic CHF (congestive heart failure) ([facility])   Pneumonia    1. Acute respiratory failure with hypoxia likely from healthcare associated pneumonia -patient is placed on vancomycin and cefepime.  Follow cultures.  Patient may also have mild contribution from CHF for which I will order 1 dose of IV Lasix.  Patient only takes Lasix as needed.  Closely follow intake output metabolic panel and daily weights.  Since patient's presentation became acutely worse and has history of chronic DVT which is not anticoagulated secondary to history of retroperitoneal bleed will check d-dimer and if elevated will get VQ scan.  Patient's last EF measured in 2017 was 65-70% with grade 1 diastolic dysfunction.  Patient has mildly elevated troponin.  ER physician did discuss with cardiologist who advised to cycle cardiac markers and patient's pneumonia or CHF could be contributing to it.  Patient denies any chest pain. 2. Hypertension on metoprolol Cardizem and Imdur. 3. Paroxysmal atrial fibrillation -chads 2 vasc score is more than 2 patient not on anticoagulation secondary to history of severe retroperitoneal bleed.  Patient is rate  controlled on metoprolol and Cardizem. 4. Chronic kidney disease stage IV -creatinine appears to be at baseline follow metabolic panel. 5. Chronic anemia likely from renal disease -follow CBC. 6. History of prostate cancer in remission.    DVT prophylaxis: Heparin. Code Status: Full code. Family Communication: Discussed with wife. Disposition Plan: Back to facility. Consults called: None. Admission status: Inpatient.   Rise Patience MD [facility] Pager [phone].  If 7PM-7AM, please contact night-coverage [email or web address] Password [facility]  07/19/2017, 7:46 PM

## 2017-07-19 NOTE — ED Notes (Signed)
Xray called for a portable chest.

## 2017-07-19 NOTE — ED Notes (Signed)
Spoke with lab about troponin and BNP results that have not resulted. They are in process.

## 2017-07-19 NOTE — Progress Notes (Signed)
Pharmacy Antibiotic Note  Johnny Morrow is a 83 y.o. male admitted on 07/19/2017 with SOB. CXR bilateral lung opacities. Starting empiric abx for pneumonia. SCr 4.3, eCrCl < 10 ml/min.   Plan: -Vancomycin 1250 mg IV x1 then 750 mg IV q48h -Monitor renal fx, cultures, VR as needed     No data recorded.  Recent Labs  Lab 07/19/17 1546  WBC 5.9    Estimated Creatinine Clearance: 9 mL/min (A) (by C-G formula based on SCr of 4.1 mg/dL (A)).    Allergies  Allergen Reactions  . Iodinated Diagnostic Agents Rash  . Ace Inhibitors Other (See Comments)    Noted (on MAR) to be "allergic," but no reaction was cited/noted  . Shellfish-Derived Products Other (See Comments)    Noted (on MAR) to be "allergic," but no reaction was cited/noted  . Penicillins Rash    Has patient had a PCN reaction causing immediate rash, facial/tongue/throat swelling, SOB or lightheadedness with hypotension: Yes Has patient had a PCN reaction causing severe rash involving mucus membranes or skin necrosis: Unknown Has patient had a PCN reaction that required hospitalization: Unknown Has patient had a PCN reaction occurring within the last 10 years: Unknown If all of the above answers are "NO", then may proceed with Cephalosporin use.     Antimicrobials this admission: 12/2 vancomycin > 12/1 cefepime >  Dose adjustments this admission: N/A  Microbiology results: N/A  Harvel Quale 07/19/2017 4:14 PM

## 2017-07-19 NOTE — ED Notes (Signed)
Spoke with pharmacist about missing antibiotics. Sending them.

## 2017-07-19 NOTE — ED Notes (Signed)
Pt continues to have labored breathing with wheezing, 97% on 2 L O2 via nasal cannula.

## 2017-07-19 NOTE — ED Triage Notes (Signed)
Per EMS- pt had shortness of breath with wheezing. Pt states "It has been coming on."  Wheezing present throughout. 10 albuterol given. No Iv access.

## 2017-07-19 NOTE — ED Provider Notes (Signed)
[facility] EMERGENCY DEPARTMENT Provider Note   CSN: [id number] Arrival date & time: 07/19/17  1539     History   Chief Complaint Chief Complaint  Patient presents with  . Shortness of Breath  . Congestive Heart Failure    HPI Johnny Morrow is a 83 y.o. male.  HPI  83 y.o. male with a hx of Atrial Fib not on anticoagulation, CHF, CKD, HTN, presents to the Emergency Department today due to shortness of breath since Friday. Pt with recent Dx of PNA that was treated with ABX last week. Noted no improvement and worsened Friday. Notes symptoms progressed today and daughter (who is physician) requested ED evaluation with possible admission. Pt denies pain. Notes shortness of breath. No CP/ABD pain. No N/V/D. No fevers. Pt does not use oxygen regularly. EMS arrived with O2 saturations low 90s. Given x2 neb treatments. Noted recent DC from hospital on 06-22-17 for aflutter with RVR. Pt not on anticoagulation due to RP bleed in May 2018. No other symptoms noted   Past Medical History:  Diagnosis Date  . Anemia   . Arthritis   . Atrial fibrillation ([place])   . Cataract    "ready to come out on both sides; anytime" (06/16/2017)  . CHF (congestive heart failure) ([place])   . CKD (chronic kidney disease), stage III ([place])    "followed by Dr. Keturah Barre @ [facility]; CKD fluctuates between III and IV" (06/16/2017)  . DVT (deep venous thrombosis) ([facility])    LLE  . History of blood transfusion ~ 01/2017; 05/2017   "blood loss; anemia"  . Hypertension   . Pneumonia 12/2016   "a touch"  . Prostate cancer [facility])     follows with hematologists q 6 months. S/p radical prostatectomy and proton therapy in 2001.  . Radiation cystitis   . Retinal hemorrhage of both eyes    follows with ophthalmologists; "never had OR" (06/16/2017)  . Retroperitoneal bleed ?12/2016   [name] 06/15/2017    Patient Active Problem List   Diagnosis Date Noted  . Tachycardia 06/15/2017  . Retroperitoneal bleed 5/18  06/15/2017  . Chronic diastolic CHF (congestive heart failure) ([place]) 06/15/2017  . Elevated troponin   . Closed intertrochanteric fracture of hip, right, initial encounter ([place]) 10 /18 06/07/2017  . Acute on chronic diastolic CHF (congestive heart failure) ([place]) 06/07/2017  . AF (paroxysmal atrial fibrillation) ([place]) 12/22/2016  . Pulmonary nodule less than 6 cm determined by computed tomography of lung 11/27/2016  . Normocytic anemia 11/27/2016  . Chronic kidney disease (CKD), stage IV (severe) ([place]) 08/20/2016  . Hyperlipidemia 08/20/2016  . Moderate tricuspid regurgitation 01/11/2016  . Mild aortic stenosis 01/11/2016  . Mild aortic regurgitation 01/11/2016  . Osteoporosis 01/11/2016  . Prediabetes 12/19/2015  . Essential hypertension, benign 09/25/2015  . History of prostate cancer 09/25/2015  . Idiopathic scoliosis, thoracic 09/25/2015  . History of DVT of lower extremity, left 09/25/2015    Past Surgical History:  Procedure Laterality Date  . FRACTURE SURGERY    . INGUINAL HERNIA REPAIR    . INTRAMEDULLARY (IM) NAIL INTERTROCHANTERIC Right 06/07/2017   Procedure: INTRAMEDULLARY (IM) NAIL INTERTROCHANTRIC;  Surgeon: [name], MD;  Location: [facility];  Service: Orthopedics;  Laterality: Right;  . PROSTATECTOMY    . TIBIA FRACTURE SURGERY Left 06/2012  . TONSILLECTOMY         Home Medications    Prior to Admission medications   Medication Sig Start Date End Date Taking? Authorizing Provider  albuterol (PROVENTIL HFA;VENTOLIN  HFA) 108 (90 Base) MCG/ACT inhaler Inhale 2 puffs into the lungs every 6 (six) hours as needed for wheezing or shortness of breath. 08/20/16   Martinique, Betty G, MD  aspirin EC 81 MG tablet Take 1 tablet (81 mg total) by mouth 2 (two) times daily. 06/08/17   Corky Sing, PA-C  B Complex Vitamins (VITAMIN-B COMPLEX PO) Take 1 tablet by mouth daily.     [provider]  bisacodyl (DULCOLAX) 5 MG EC tablet Take 1 tablet (5 mg total) by mouth  daily as needed for moderate constipation. 06/12/17   Nita Sells, MD  Cholecalciferol (VITAMIN D3) 5000 units TABS Take 5,000 Units by mouth daily.     [provider]  Coenzyme Q10 (CO Q 10) 100 MG CAPS Take 100 mg by mouth daily.    [provider]  diltiazem (CARDIZEM CD) 360 MG 24 hr capsule Take 1 capsule (360 mg total) daily by mouth. 06/23/17   Caren Griffins, MD  doxazosin (CARDURA) 1 MG tablet Take 1 mg by mouth every evening.    [provider]  ferrous sulfate 325 (65 FE) MG tablet Take 1 tablet (325 mg total) by mouth daily with breakfast. 12/22/16   [place], Betty G, MD  furosemide (LASIX) 40 MG tablet Take 40 mg by mouth daily as needed (IF A WEIGHT GAIN OF 2 POUNDS OR MORE OCCURS).     [provider]  isosorbide mononitrate (IMDUR) 60 MG 24 hr tablet Take 0.5 tablets (30 mg total) daily by mouth. 06/22/17   Caren Griffins, MD  Melatonin 3 MG TABS Take 1 tablet by mouth at bedtime as needed.    [provider]  metoprolol tartrate (LOPRESSOR) 25 MG tablet Take 0.5 tablets (12.5 mg total) 2 (two) times daily by mouth. 06/22/17   Caren Griffins, MD  Multiple Vitamin (MULTI VITAMIN PO) Take 1 tablet by mouth daily.     [provider]  Omega 3 1000 MG CAPS Take 2,000 mg by mouth daily.    [provider]  polyethylene glycol (MIRALAX / GLYCOLAX) packet Take 17 g by mouth daily. 06/12/17   Nita Sells, MD  Probiotic Product (PROBIOTIC PO) Chew 1 tablet (250 million cell) by mouth once a day    [provider]  senna (SENOKOT) 8.6 MG TABS tablet Take 1 tablet by mouth every morning.    [provider]  traMADol (ULTRAM) 50 MG tablet Take 1 tablet (50 mg total) every 6 (six) hours as needed by mouth for moderate pain. 06/22/17   Caren Griffins, MD    Family History Family History  Problem Relation Age of Onset  . COPD Brother   . Heart attack Brother   . Cancer Father   . Heart  attack Father   . Diabetes Mother     Social History Social History   Tobacco Use  . Smoking status: Former Smoker    Years: 10.00    Types: Cigarettes    Last attempt to quit: 1947    Years since quitting: 71.9  . Smokeless tobacco: Never Used  Substance Use Topics  . Alcohol use: Yes    Alcohol/week: 0.6 oz    Types: 1 Glasses of wine per week  . Drug use: No     Allergies   Iodinated diagnostic agents; Ace inhibitors; Shellfish-derived products; and Penicillins   Review of Systems Review of Systems ROS reviewed and all are negative for acute change except as noted  in the HPI.  Physical Exam Updated Vital Signs BP 129/82   Pulse (!) 52   Resp 20   SpO2 96%   Physical Exam  Constitutional: He is oriented to person, place, and time. He appears well-developed and well-nourished. No distress.  HENT:  Head: Normocephalic and atraumatic.  Right Ear: Tympanic membrane, external ear and ear canal normal.  Left Ear: Tympanic membrane, external ear and ear canal normal.  Nose: Nose normal.  Mouth/Throat: Uvula is midline, oropharynx is clear and moist and mucous membranes are normal. No trismus in the jaw. No oropharyngeal exudate, posterior oropharyngeal erythema or tonsillar abscesses.  Eyes: EOM are normal. Pupils are equal, round, and reactive to light.  Neck: Normal range of motion. Neck supple. No tracheal deviation present.  Cardiovascular: Normal rate, regular rhythm, S1 normal, S2 normal, normal heart sounds, intact distal pulses and normal pulses.  Pulmonary/Chest: Effort normal. No respiratory distress. He has decreased breath sounds in the right lower field and the left lower field. He has no wheezes. He has no rhonchi. He has no rales.  Abdominal: Normal appearance and bowel sounds are normal. There is no tenderness.  Musculoskeletal: Normal range of motion.  Neurological: He is alert and oriented to person, place, and time.  Skin: Skin is warm and dry.    Psychiatric: He has a normal mood and affect. His speech is normal and behavior is normal. Thought content normal.  Nursing note and vitals reviewed.  ED Treatments / Results  Labs (all labs ordered are listed, but only abnormal results are displayed) Labs Reviewed  CBC WITH DIFFERENTIAL/PLATELET - Abnormal; Notable for the following components:      Result Value   RBC 3.22 (*)    Hemoglobin 9.1 (*)    HCT 27.2 (*)    Platelets 85 (*)    All other components within normal limits  BASIC METABOLIC PANEL - Abnormal; Notable for the following components:   Glucose, Bld 166 (*)    BUN 89 (*)    Creatinine, Ser 4.30 (*)    Calcium 8.6 (*)    GFR calc non Af Amer 11 (*)    GFR calc Af Amer 12 (*)    All other components within normal limits  TROPONIN I - Abnormal; Notable for the following components:   Troponin I 0.04 (*)    All other components within normal limits  BRAIN NATRIURETIC PEPTIDE  I-STAT TROPONIN, ED    EKG  EKG Interpretation  Date/Time:  Sunday July 19 2017 15:41:00 EST Ventricular Rate:  67 PR Interval:    QRS Duration: 139 QT Interval:  532 QTC Calculation: 562 R Axis:   11 Text Interpretation:  Atrial flutter Right bundle branch block Nonspecific T abnormalities, lateral leads When compared to prior, similar a flutter with slower rate now.  No STEMI Confirmed by Antony Blackbird [phone]) on 07/19/2017 3:54:54 PM       Radiology Dg Chest Portable 1 View  Result Date: 07/19/2017 CLINICAL DATA:  Acute shortness of breath and wheezing. EXAM: PORTABLE CHEST 1 VIEW COMPARISON:  06/15/2017 and prior chest radiograph FINDINGS: Cardiomegaly and pulmonary vascular congestion noted Increased bilateral lower lung opacities/ airspace disease noted. Probable trace right pleural effusion noted. There is no evidence of pneumothorax or acute bony abnormality. IMPRESSION: Increasing bilateral lower lung opacities -question atelectasis and/or airspace disease/ pneumonia.  Probable trace right pleural effusion. Cardiomegaly and pulmonary vascular congestion. Electronically Signed   By: Margarette Canada M.D.   On: 07/19/2017 16:10  Procedures Procedures (including critical care time)  Medications Ordered in ED Medications  ceFEPIme (MAXIPIME) 1 g in dextrose 5 % 50 mL IVPB (not administered)  vancomycin (VANCOCIN) 1,250 mg in sodium chloride 0.9 % 250 mL IVPB (not administered)     Initial Impression / Assessment and Plan / ED Course  I have reviewed the triage vital signs and the nursing notes.  Pertinent labs & imaging results that were available during my care of the patient were reviewed by me and considered in my medical decision making (see chart for details).  Final Clinical Impressions(s) / ED Diagnoses  {I have reviewed and evaluated the relevant laboratory values. {I have reviewed and evaluated the relevant imaging studies. {I have interpreted the relevant EKG. {I have reviewed the relevant previous healthcare records. {I have reviewed EMS Documentation. {I obtained HPI from historian. {Patient discussed with supervising physician.  ED Course:  Assessment: Pt is a 83 y.o. male with a hx of Atrial Fib not on anticoagulation, CHF, CKD, HTN, presents to the Emergency Department today due to shortness of breath since Friday. Pt with recent Dx of PNA that was treated with ABX last week. Noted no improvement and worsened Friday. Notes symptoms progressed today and daughter (who is physician) requested ED evaluation with possible admission. Pt denies pain. Notes shortness of breath. No CP/ABD pain. No N/V/D. No fevers. Pt does not use oxygen regularly. EMS arrived with O2 saturations low 90s. Given x2 neb treatments. On exam, pt in NAD. Nontoxic/nonseptic appearing. VSS. O2 sat low 90s on RA. Placed on 2L .  Afebrile. Lungs diminished bilaterally mild wheeze. Heart RRR. Abdomen nontender soft. EKG unremarkable. Trop 0.04. Notified cardiology. CBC without  leukocytosis. BMP baseline. CXR with bilateral lung opacities. Likely pneumonia. Given cefepime and vanc for HCAP in ED. Plan is to [facility].   Disposition/Plan:  Admit Pt acknowledges and agrees with plan  Supervising Physician Tegeler, Gwenyth Allegra, *  Final diagnoses:  HCAP (healthcare-associated pneumonia)    ED Discharge Orders    None       Shary Decamp, PA-C 07/19/17 1841    Tegeler, Gwenyth Allegra, MD 07/21/17 1316

## 2017-07-20 ENCOUNTER — Inpatient Hospital Stay (HOSPITAL_COMMUNITY): Payer: Medicare Other

## 2017-07-20 DIAGNOSIS — N184 Chronic kidney disease, stage 4 (severe): Secondary | ICD-10-CM

## 2017-07-20 DIAGNOSIS — I5033 Acute on chronic diastolic (congestive) heart failure: Secondary | ICD-10-CM

## 2017-07-20 DIAGNOSIS — J9601 Acute respiratory failure with hypoxia: Secondary | ICD-10-CM

## 2017-07-20 DIAGNOSIS — I10 Essential (primary) hypertension: Secondary | ICD-10-CM

## 2017-07-20 LAB — MRSA PCR SCREENING: MRSA BY PCR: NEGATIVE

## 2017-07-20 LAB — CBC
HEMATOCRIT: 24.9 % — AB (ref 39.0–52.0)
HEMOGLOBIN: 8.4 g/dL — AB (ref 13.0–17.0)
MCH: 28.1 pg (ref 26.0–34.0)
MCHC: 33.7 g/dL (ref 30.0–36.0)
MCV: 83.3 fL (ref 78.0–100.0)
Platelets: 75 10*3/uL — ABNORMAL LOW (ref 150–400)
RBC: 2.99 MIL/uL — ABNORMAL LOW (ref 4.22–5.81)
RDW: 15.2 % (ref 11.5–15.5)
WBC: 3.7 10*3/uL — ABNORMAL LOW (ref 4.0–10.5)

## 2017-07-20 LAB — BASIC METABOLIC PANEL
ANION GAP: 7 (ref 5–15)
BUN: 86 mg/dL — ABNORMAL HIGH (ref 6–20)
CO2: 22 mmol/L (ref 22–32)
Calcium: 8.4 mg/dL — ABNORMAL LOW (ref 8.9–10.3)
Chloride: 110 mmol/L (ref 101–111)
Creatinine, Ser: 4.22 mg/dL — ABNORMAL HIGH (ref 0.61–1.24)
GFR calc Af Amer: 13 mL/min — ABNORMAL LOW (ref >60)
GFR, EST NON AFRICAN AMERICAN: 11 mL/min — AB (ref >60)
GLUCOSE: 125 mg/dL — AB (ref 65–99)
POTASSIUM: 4.2 mmol/L (ref 3.5–5.1)
SODIUM: 139 mmol/L (ref 135–145)

## 2017-07-20 LAB — TROPONIN I
TROPONIN I: 0.05 ng/mL — AB (ref <0.03)
Troponin I: 0.05 ng/mL (ref <0.03)

## 2017-07-20 LAB — INFLUENZA PANEL BY PCR (TYPE A & B)
INFLBPCR: NEGATIVE
Influenza A By PCR: NEGATIVE

## 2017-07-20 LAB — D-DIMER, QUANTITATIVE (NOT AT ARMC): D DIMER QUANT: 1.56 ug{FEU}/mL — AB (ref 0.00–0.50)

## 2017-07-20 MED ORDER — METOPROLOL TARTRATE 5 MG/5ML IV SOLN: 5.0000 mg | Freq: Once | INTRAVENOUS | Status: DC | PRN

## 2017-07-20 MED ORDER — DARBEPOETIN ALFA 100 MCG/0.5ML IJ SOSY
100.0000 ug | PREFILLED_SYRINGE | Freq: Once | INTRAMUSCULAR | Status: AC
  Administered 2017-07-20: 100 ug via SUBCUTANEOUS
  Filled 2017-07-20 (×2): qty 0.5

## 2017-07-20 MED ORDER — TECHNETIUM TC 99M DIETHYLENETRIAME-PENTAACETIC ACID
30.0000 | Freq: Once | INTRAVENOUS | Status: AC | PRN
Start: 2017-07-20 — End: 2017-07-20
  Administered 2017-07-20: 30 via RESPIRATORY_TRACT

## 2017-07-20 MED ORDER — FUROSEMIDE 10 MG/ML IJ SOLN
20.0000 mg | Freq: Once | INTRAMUSCULAR | Status: AC
  Administered 2017-07-20: 20 mg via INTRAVENOUS
  Filled 2017-07-20: qty 2

## 2017-07-20 MED ORDER — TECHNETIUM TO 99M ALBUMIN AGGREGATED
4.0000 | Freq: Once | INTRAVENOUS | Status: AC | PRN
  Administered 2017-07-20: 4 via INTRAVENOUS

## 2017-07-20 MED ORDER — BLISTEX MEDICATED EX OINT
TOPICAL_OINTMENT | CUTANEOUS | Status: DC | PRN
  Filled 2017-07-20: qty 6.3

## 2017-07-20 MED ORDER — FUROSEMIDE 10 MG/ML IJ SOLN
40.0000 mg | Freq: Two times a day (BID) | INTRAMUSCULAR | Status: DC
  Administered 2017-07-20 – 2017-07-22 (×4): 40 mg via INTRAVENOUS
  Filled 2017-07-20 (×4): qty 4

## 2017-07-20 NOTE — Clinical Social Work Note (Signed)
Clinical Social Work Assessment  Patient Details  Name: Johnny Morrow MRN: [id number] Date of Birth: 09/07/1919  Date of referral:  07/20/17               Reason for consult:  Facility Placement(Patient from [facility] rehab)                Permission sought to share information with:  Family Supports Permission granted to share information::  No(Patient did not give CSW verbal consent, but allowed his wife to actively participate in the conversation)  Name::     Johnny Morrow and Johnny Morrow::     Relationship::  Wife and daughter  Contact Information:  Wife - [phone] and Daughter - [phone]  Housing/Transportation Living arrangements for the past 2 months:  Apartment(Patient and wife have a villa at [place] [employer]) Source of Information:  Patient, Spouse Patient Interpreter Needed:  None Criminal Activity/Legal Involvement Pertinent to Current Situation/Hospitalization:  No - Comment as needed Significant Relationships:  Adult Children, Spouse Lives with:  Spouse(Currently at [facility] rehab and patient and wife have a villa at [place] [employer]) Do you feel safe going back to the place where you live?  Yes(Patient wants to return home and go to outpatient rehab) Need for family participation in patient care:  Yes (Comment)  Care giving concerns:  Wife reported that patient was beginning to work on balance in rehab at [employer] [employer] before coming to hospital. Mrs. Mcnutt expressed concern about his balance if he comes home from the hospital. She also expressed concern about the possibility that he contracted  pneumonia at the rehab facility.  Social Worker assessment / plan:  CSW talked at the bedside with patient and his wife. Dr. 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 CSW initiated conversation regarding discharge disposition and patient indicated that  he wants to go home at discharge and go to outpatient rehab and this was discussed. CSW advised patient and wife that PT/OT will be consulted and they will evaluate patient and make a recommendation regarding the appropriate venue for patient's discharge and wife expressed understanding. Patient and wife informed that CSW will talk with nurse regarding the need for a PT/OT evaluation.  Employment status:  Retired Forensic scientist:  Medicare PT Recommendations:  Not assessed at this time [place] / Referral to community resources:  Other (Comment Required)(Patient want to discharge home and go to outpatient rehab. Wife had questions about CIR ([facility] re[facility]))  Patient/Family's Response to care: No concerns expressed by patient or wife regarding care during hospitalization.  Patient/Family's Understanding of and Emotional Response to Diagnosis, Current Treatment, and Prognosis:  Wife expressed understanding regarding patient's medical condition.   Emotional Assessment Appearance:  Appears younger than stated age Attitude/Demeanor/Rapport:  Other(Appropriate) Affect (typically observed):  Appropriate Orientation:  Oriented to Self, Oriented to Place, Oriented to  Time, Oriented to Situation Alcohol / Substance use:  Tobacco Use, Alcohol Use, Illicit Drugs(Patient reported that he quit smoking, drinks one glass of wine per week and does not use illicit drugs) Psych involvement (Current and /or in the community):  No (Comment)  Discharge Needs  Concerns to be addressed:  Discharge Planning Concerns Readmission within the last 30 days:  No Current discharge risk:  None Barriers to Discharge:  Continued Medical Work up   [employer] Mila Homer, [place] 07/20/2017, 2:03 PM

## 2017-07-20 NOTE — Progress Notes (Signed)
[facility] PROGRESS NOTE  Johnny Morrow RJJ:[id number] DOB: January 28, 1920 DOA: 07/19/2017  PCP: Wenda Low, MD  Brief History/Interval Summary: 83 year old African-American male with a past medical history of chronic diastolic CHF paroxysmal atrial fibrillation not on anticoagulation due to severe retroperitoneal bleed in the recent past, history of hypertension, history of DVT in the past, history of atrial flutter, chronic kidney disease stage IV followed at [facility] who lives in a nursing facility and presented with worsening shortness of breath.  Concern was for healthcare associated pneumonia.  Patient was hospitalized for further management.  Reason for Visit: Acute respiratory failure with hypoxia  Consultants: Nephrology  Procedures: None  Antibiotics: Vancomycin and cefepime  Subjective/Interval History: Patient lethargic.  Easily arousable but goes right back to sleep.  His daughter is at the bedside.  ROS: Unable to do  Objective:  Vital Signs  Vitals:   07/19/17 2103 07/20/17 0257 07/20/17 0500 07/20/17 0912  BP: (!) 141/46 (!) 134/94 124/64 (!) 148/98  Pulse: 71 (!) 109 74 (!) 108  Resp: 17 (!) 24 18 18   Temp: 97.7 F (36.5 C)  (!) 95.6 F (35.3 C) 98 F (36.7 C)  TempSrc: Oral   Oral  SpO2: 97% 99% 99% 99%  Weight: 64.7 kg (142 lb 10.2 oz)       Intake/Output Summary (Last 24 hours) at 07/20/2017 1236 Last data filed at 07/20/2017 1127 Gross per 24 hour  Intake 480 ml  Output 1000 ml  Net -520 ml   Filed Weights   07/19/17 2103  Weight: 64.7 kg (142 lb 10.2 oz)    General appearance: Lethargic but easily arousable.  Does not appear to be in any distress.  Protecting his airway. Head: Normocephalic, without obvious abnormality, atraumatic Resp: Diminished air entry at the bases with crackles bilaterally.  No wheezing or rhonchi.  Mildly tachypneic. Cardio: regular rate and rhythm, S1, S2 normal, no murmur, click, rub or gallop GI:  soft, non-tender; bowel sounds normal; no masses,  no organomegaly Extremities: extremities normal, atraumatic, no cyanosis or edema Neurologic: No focal neurological deficits.  Lab Results:  Data Reviewed: I have personally reviewed following labs and imaging studies  CBC: Recent Labs  Lab 07/19/17 1546 07/19/17 2101 07/20/17 0148  WBC 5.9 4.7 3.7*  NEUTROABS 3.3  --   --   HGB 9.1* 8.5* 8.4*  HCT 27.2* 25.6* 24.9*  MCV 84.5 83.4 83.3  PLT 85* 83* 75*    Basic Metabolic Panel: Recent Labs  Lab 07/19/17 1546 07/19/17 2101 07/20/17 0148  NA 139  --  139  K 4.2  --  4.2  CL 108  --  110  CO2 22  --  22  GLUCOSE 166*  --  125*  BUN 89*  --  86*  CREATININE 4.30* 4.40* 4.22*  CALCIUM 8.6*  --  8.4*    GFR: Estimated Creatinine Clearance: 8.8 mL/min (A) (by C-G formula based on SCr of 4.22 mg/dL (H)).  Cardiac Enzymes: Recent Labs  Lab 07/19/17 1546 07/19/17 2101 07/20/17 0148 07/20/17 0638  TROPONINI 0.04* 0.05* 0.05* 0.05*      Recent Results (from the past 240 hour(s))  MRSA PCR Screening     Status: None   Collection Time: 07/19/17 11:03 PM  Result Value Ref Range Status   MRSA by PCR NEGATIVE NEGATIVE Final    Comment:        The GeneXpert MRSA Assay (FDA approved for NASAL specimens only), is one component of a comprehensive  MRSA colonization surveillance program. It is not intended to diagnose MRSA infection nor to guide or monitor treatment for MRSA infections.       Radiology Studies: Dg Chest Portable 1 View  Result Date: 07/19/2017 CLINICAL DATA:  Acute shortness of breath and wheezing. EXAM: PORTABLE CHEST 1 VIEW COMPARISON:  06/15/2017 and prior chest radiograph FINDINGS: Cardiomegaly and pulmonary vascular congestion noted Increased bilateral lower lung opacities/ airspace disease noted. Probable trace right pleural effusion noted. There is no evidence of pneumothorax or acute bony abnormality. IMPRESSION: Increasing bilateral lower  lung opacities -question atelectasis and/or airspace disease/ pneumonia. Probable trace right pleural effusion. Cardiomegaly and pulmonary vascular congestion. Electronically Signed   By: Margarette Canada M.D.   On: 07/19/2017 16:10     Medications:  Scheduled: . aspirin EC  81 mg Oral BID  . ceFEPime (MAXIPIME) IV  500 mg Intravenous Q24H  . cholecalciferol  5,000 Units Oral Daily  . diltiazem  360 mg Oral Daily  . doxazosin  1 mg Oral QPM  . ferrous sulfate  325 mg Oral Q breakfast  . heparin  5,000 Units Subcutaneous Q8H  . isosorbide mononitrate  30 mg Oral Daily  . Melatonin  9 mg Oral QHS  . metoprolol tartrate  12.5 mg Oral BID  . omega-3 acid ethyl esters  2 g Oral Daily  . polyethylene glycol  34 g Oral Daily  . senna  1 tablet Oral q morning - 10a   Continuous: . [START ON 07/21/2017] vancomycin     [id number] **OR** acetaminophen, bisacodyl, levalbuterol, lip balm, metoprolol tartrate, ondansetron **OR** ondansetron (ZOFRAN) IV, traMADol  Assessment/Plan:  Principal Problem:   HCAP (healthcare-associated pneumonia) Active Problems:   Essential hypertension, benign   Chronic kidney disease (CKD), stage IV (severe) ([facility])   AF (paroxysmal atrial fibrillation) ([facility])   Chronic diastolic CHF (congestive heart failure) (H[facility])   Pneumonia    Acute respiratory failure with hypoxia Etiology remains unclear.  There is suspicion for healthcare associated pneumonia.  However patient does have chronic kidney disease.  He also has diastolic CHF.  He has gained significant amount of weight over the last 2 months.  So this could all be fluid overload as well.  He was given a small amount of Lasix yesterday evening.  Continue to monitor for now.  Continue oxygen.  Also noted to have elevated d-dimer although the likelihood of venous thromboembolism is low.  Healthcare associated pneumonia Continue vancomycin and cefepime.  Follow culture reports.  Influenza PCR negative.  Strep  pneumonia urinary antigen negative.  Chronic kidney disease stage IV Followed by nephrology at [facility].  Patient has been on diuretics at home.  However he has gained significant amount of weight loss few weeks.  He likely needs high-dose Lasix.  We will consult nephrology to assist with management.  Acute on chronic diastolic CHF Fluid overload likely due to combination of CKD as well as CHF.  As discussed above.  Echocardiogram from 2017 showed normal systolic function.  Strict ins and outs.  Daily weights.  History of paroxysmal atrial fibrillation Monitor on telemetry.  Continue metoprolol and Cardizem.  Patient not on anticoagulation due to history of severe retroperitoneal bleed.  Anemia of chronic kidney disease Follow CBC closely.  No evidence of overt bleeding.  History of prostate cancer in remission Stable.  Thrombocytopenia  Platelet count was noted to be low.  This appears to be new compared to previous hospitalization.  Etiology is unclear.  Will stop heparin  for now.   DVT Prophylaxis: Change over the SCDs.    Code Status: Remains full code.  Discussed briefly with daughter who will discuss with the rest of the family members. Family Communication: Discussed with daughter at bedside Disposition Plan: Management as outlined above.  Likely will return back to his nursing facility at discharge.    LOS: 1 day   [facility] Pager [phone] 07/20/2017, 12:36 PM  If 7PM-7AM, please contact night-coverage at [email or web address], password [facility]

## 2017-07-20 NOTE — Consult Note (Signed)
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  He also has known stage IV CKD with a creatinine as high as 5 within the last month.  Creatinine in 2017 was in the twos-he is followed by a nephrologist at [facility].  Patient knows that he has stage IV CKD.  I asked if his primary nephrologist ever mentioned dialysis and patient stated that that was the last resort.  I told him I have never put a 83 year old on dialysis.  He said" good, do not start with me".  Patient was residing with his wife in a [facility]-he was undergoing physical therapy for dizziness.  He presented to the hospital with worsening shortness of breath.  Chest x-ray showed cardiomegaly and pulmonary vascular congestion with increased lower lung opacities/airspace disease-differential infection versus fluid with probable trace pleural effusion.  He was admitted, placed on Maxipime and also given 20 mg of Lasix IV.  So far a liter of urine has been recorded.  Patient was only on a as needed Lasix as an outpatient.  He states that his appetite has been pretty good, he does have lower extremity edema, he is anxious to get back to his previous lifestyle  Creatinine  Date/Time Value Ref Range Status  07/06/2017 05:00 AM 4.1 (A) 0.6 - 1.3 Final  06/30/2017 03:00 AM 5.1 (A) 0.6 - 1.3 Final  06/15/2017 4.6 (A) 0.6 - 1.3 Final  07/29/2016 2.6 (A) 0.6 - 1.3 mg/dL Final   Creatinine, Ser  Date/Time Value Ref Range Status  07/20/2017 01:48 AM 4.22 (H) 0.61 - 1.24 mg/dL Final  07/19/2017 09:01 PM 4.40 (H) 0.61 - 1.24 mg/dL Final  07/19/2017 03:46 PM 4.30 (H) 0.61 - 1.24 mg/dL Final  06/22/2017 02:40 AM 4.55 (H) 0.61 - 1.24 mg/dL Final   06/21/2017 01:36 AM 4.46 (H) 0.61 - 1.24 mg/dL Final  06/19/2017 02:47 AM 4.43 (H) 0.61 - 1.24 mg/dL Final  06/18/2017 02:47 AM 4.19 (H) 0.61 - 1.24 mg/dL Final  06/17/2017 02:30 AM 4.27 (H) 0.61 - 1.24 mg/dL Final  06/16/2017 04:35 AM 4.39 (H) 0.61 - 1.24 mg/dL Final  06/15/2017 05:30 PM 4.56 (H) 0.61 - 1.24 mg/dL Final  06/12/2017 03:08 AM 4.86 (H) 0.61 - 1.24 mg/dL Final  06/11/2017 04:34 AM 5.15 (H) 0.61 - 1.24 mg/dL Final  06/10/2017 02:47 AM 4.95 (H) 0.61 - 1.24 mg/dL Final  06/09/2017 03:12 PM 5.03 (H) 0.61 - 1.24 mg/dL Final  06/09/2017 07:37 AM 4.93 (H) 0.61 - 1.24 mg/dL Final  06/08/2017 03:50 PM 4.85 (H) 0.61 - 1.24 mg/dL Final  06/08/2017 04:13 AM 4.26 (H) 0.61 - 1.24 mg/dL Final  06/07/2017 10:22 AM 3.73 (H) 0.61 - 1.24 mg/dL Final  12/22/2016 02:53 PM 3.84 (H) 0.40 - 1.50 mg/dL Final  11/27/2016 03:18 PM 3.92 (H) 0.40 - 1.50 mg/dL Final  08/20/2016 12:21 PM 2.45 (H) 0.40 - 1.50 mg/dL Final  03/25/2016 08:38 AM 2.25 (H) 0.40 - 1.50 mg/dL Final  12/24/2015 09:10 AM 2.29 (H) 0.40 - 1.50 mg/dL Final  12/12/2015 12:07 PM 2.18 (H) 0.40 - 1.50 mg/dL Final     PMHx:   Past Medical History:  Diagnosis Date  . Anemia   . Arthritis   . Atrial fibrillation ([place])   . Cataract    "  ready to come out on both sides; anytime" (06/16/2017)  . CHF (congestive heart failure) ([place])   . CKD (chronic kidney disease), stage III ([place])    "followed by Dr. Keturah Barre @ [facility]; CKD fluctuates between III and IV" (06/16/2017)  . DVT (deep venous thrombosis) ([facility])    LLE  . History of blood transfusion ~ 01/2017; 05/2017   "blood loss; anemia"  . Hypertension   . Pneumonia 12/2016   "a touch"  . Prostate cancer [facility])     follows with hematologists q 6 months. S/p radical prostatectomy and proton therapy in 2001.  . Radiation cystitis   . Retinal hemorrhage of both eyes    follows with ophthalmologists; "never had OR" (06/16/2017)  . Retroperitoneal bleed ?12/2016   [name] 06/15/2017    Past  Surgical History:  Procedure Laterality Date  . FRACTURE SURGERY    . INGUINAL HERNIA REPAIR    . INTRAMEDULLARY (IM) NAIL INTERTROCHANTERIC Right 06/07/2017   Procedure: INTRAMEDULLARY (IM) NAIL INTERTROCHANTRIC;  Surgeon: [name], MD;  Location: [facility];  Service: Orthopedics;  Laterality: Right;  . PROSTATECTOMY    . TIBIA FRACTURE SURGERY Left 06/2012  . TONSILLECTOMY      Family Hx:  Family History  Problem Relation Age of Onset  . COPD Brother   . Heart attack Brother   . Cancer Father   . Heart attack Father   . Diabetes Mother     Social History:  reports that he quit smoking about 71 years ago. His smoking use included cigarettes. He quit after 10.00 years of use. he has never used smokeless tobacco. He reports that he drinks about 0.6 oz of alcohol per week. He reports that he does not use drugs.  Allergies:  Allergies  Allergen Reactions  . Iodinated Diagnostic Agents Rash  . Ace Inhibitors Other (See Comments)    Noted (on MAR) to be "allergic," but no reaction was cited/noted  . Shellfish-Derived Products Other (See Comments)    Noted (on MAR) to be "allergic," but no reaction was cited/noted  . Penicillins Rash    Has patient had a PCN reaction causing immediate rash, facial/tongue/throat swelling, SOB or lightheadedness with hypotension: Yes Has patient had a PCN reaction causing severe rash involving mucus membranes or skin necrosis: Unknown Has patient had a PCN reaction that required hospitalization: Unknown Has patient had a PCN reaction occurring within the last 10 years: Unknown If all of the above answers are "NO", then may proceed with Cephalosporin use.     Medications: Prior to Admission medications   Medication Sig Start Date End Date Taking? Authorizing Provider  albuterol (PROVENTIL HFA;VENTOLIN HFA) 108 (90 Base) MCG/ACT inhaler Inhale 2 puffs into the lungs every 6 (six) hours as needed for wheezing or shortness of breath. 08/20/16  Yes  Martinique, Betty G, MD  aspirin EC 81 MG tablet Take 1 tablet (81 mg total) by mouth 2 (two) times daily. 06/08/17  Yes Corky Sing, PA-C  B Complex Vitamins (VITAMIN-B COMPLEX PO) Take 1 tablet by mouth daily.    Yes [provider]  bisacodyl (DULCOLAX) 5 MG EC tablet Take 1 tablet (5 mg total) by mouth daily as needed for moderate constipation. 06/12/17  Yes Nita Sells, MD  Cholecalciferol (VITAMIN D3) 5000 units TABS Take 5,000 Units by mouth daily.    Yes [provider]  Coenzyme Q10 (CO Q 10) 100 MG CAPS Take 100 mg by mouth daily.   Yes [provider]  diltiazem (CARDIZEM CD) 360 MG 24 hr capsule Take 1 capsule (360 mg total) daily by mouth. 06/23/17  Yes Gherghe, Vella Redhead, MD  doxazosin (CARDURA) 1 MG tablet Take 1 mg by mouth every evening.   Yes [provider]  ferrous sulfate 325 (65 FE) MG tablet Take 1 tablet (325 mg total) by mouth daily with breakfast. 12/22/16  Yes Martinique, Betty G, MD  furosemide (LASIX) 40 MG tablet Take 40 mg by mouth daily as needed (IF A WEIGHT GAIN OF 2 POUNDS OR MORE OCCURS).    Yes [provider]  isosorbide mononitrate (IMDUR) 60 MG 24 hr tablet Take 0.5 tablets (30 mg total) daily by mouth. 06/22/17  Yes Caren Griffins, MD  levalbuterol Penne Lash) 0.63 MG/3ML nebulizer solution Take 0.63 mg by nebulization at bedtime as needed for wheezing or shortness of breath.   Yes [provider]  Melatonin 3 MG TABS Take 10 mg by mouth at bedtime.    Yes [provider]  metoprolol tartrate (LOPRESSOR) 25 MG tablet Take 0.5 tablets (12.5 mg total) 2 (two) times daily by mouth. Patient taking differently: Take 12.5 mg by mouth 2 (two) times daily. Hold for HR < 50 BPM or SBP < 90 06/22/17  Yes Gherghe, Vella Redhead, MD  Multiple Vitamin (MULTI VITAMIN PO) Take 1 tablet by mouth daily.    Yes [provider]  Omega 3 1000 MG CAPS Take 2,000 mg by mouth daily.   Yes [provider]   polyethylene glycol (MIRALAX / GLYCOLAX) packet Take 17 g by mouth daily. Patient taking differently: Take 34 g by mouth daily.  06/12/17  Yes Nita Sells, MD  Probiotic Product (PROBIOTIC PO) Chew 1 tablet (250 million cell) by mouth once a day   Yes [provider]  senna (SENOKOT) 8.6 MG TABS tablet Take 1 tablet by mouth every morning.   Yes [provider]  traMADol (ULTRAM) 50 MG tablet Take 1 tablet (50 mg total) every 6 (six) hours as needed by mouth for moderate pain. 06/22/17  Yes Caren Griffins, MD    I have reviewed the patient's current medications.  Labs:  Results for orders placed or performed during the hospital encounter of 07/19/17 (from the past 48 hour(s))  CBC with Differential     Status: Abnormal   Collection Time: 07/19/17  3:46 PM  Result Value Ref Range   WBC 5.9 4.0 - 10.5 K/uL   RBC 3.22 (L) 4.22 - 5.81 MIL/uL   Hemoglobin 9.1 (L) 13.0 - 17.0 g/dL   HCT 27.2 (L) 39.0 - 52.0 %   MCV 84.5 78.0 - 100.0 fL   MCH 28.3 26.0 - 34.0 pg   MCHC 33.5 30.0 - 36.0 g/dL   RDW 15.2 11.5 - 15.5 %   Platelets 85 (L) 150 - 400 K/uL    Comment: SPECIMEN CHECKED FOR CLOTS REPEATED TO VERIFY PLATELET COUNT CONFIRMED BY SMEAR    Neutrophils Relative % 57 %   Neutro Abs 3.3 1.7 - 7.7 K/uL   Lymphocytes Relative 31 %   Lymphs Abs 1.8 0.7 - 4.0 K/uL   Monocytes Relative 10 %   Monocytes Absolute 0.6 0.1 - 1.0 K/uL   Eosinophils Relative 2 %   Eosinophils Absolute 0.1 0.0 - 0.7 K/uL   Basophils Relative 0 %   Basophils Absolute 0.0 0.0 - 0.1 K/uL  Basic metabolic panel     Status: Abnormal   Collection Time: 07/19/17  3:46 PM  Result Value Ref Range   Sodium 139 135 - 145 mmol/L   Potassium 4.2 3.5 - 5.1 mmol/L   Chloride 108 101 - 111 mmol/L   CO2 22 22 - 32 mmol/L   Glucose, Bld 166 (H) 65 - 99 mg/dL   BUN 89 (H) 6 - 20 mg/dL   Creatinine, Ser 4.30 (H) 0.61 - 1.24 mg/dL   Calcium 8.6 (L) 8.9 - 10.3 mg/dL   GFR calc non Af Amer 11 (L)  >60 mL/min   GFR calc Af Amer 12 (L) >60 mL/min    Comment: (NOTE) The eGFR has been calculated using the CKD EPI equation. This calculation has not been validated in all clinical situations. eGFR's persistently <60 mL/min signify possible Chronic Kidney Disease.    Anion gap 9 5 - 15  Troponin I     Status: Abnormal   Collection Time: 07/19/17  3:46 PM  Result Value Ref Range   Troponin I 0.04 (HH) <0.03 ng/mL    Comment: CRITICAL RESULT CALLED TO, READ BACK BY AND VERIFIED WITH: A.REABOLD,RN SPIKESN 12.2.18 1807   Brain natriuretic peptide     Status: Abnormal   Collection Time: 07/19/17  6:28 PM  Result Value Ref Range   B Natriuretic Peptide 2,324.6 (H) 0.0 - 100.0 pg/mL  Strep pneumoniae urinary antigen     Status: None   Collection Time: 07/19/17  8:22 PM  Result Value Ref Range   Strep Pneumo Urinary Antigen NEGATIVE NEGATIVE    Comment:        Infection due to S. pneumoniae cannot be absolutely ruled out since the antigen present may be below the detection limit of the test.   CBC     Status: Abnormal   Collection Time: 07/19/17  9:01 PM  Result Value Ref Range   WBC 4.7 4.0 - 10.5 K/uL   RBC 3.07 (L) 4.22 - 5.81 MIL/uL   Hemoglobin 8.5 (L) 13.0 - 17.0 g/dL   HCT 25.6 (L) 39.0 - 52.0 %   MCV 83.4 78.0 - 100.0 fL   MCH 27.7 26.0 - 34.0 pg   MCHC 33.2 30.0 - 36.0 g/dL   RDW 15.1 11.5 - 15.5 %   Platelets 83 (L) 150 - 400 K/uL    Comment: CONSISTENT WITH PREVIOUS RESULT  Creatinine, serum     Status: Abnormal   Collection Time: 07/19/17  9:01 PM  Result Value Ref Range   Creatinine, Ser 4.40 (H) 0.61 - 1.24 mg/dL   GFR calc non Af Amer 10 (L) >60 mL/min   GFR calc Af Amer 12 (L) >60 mL/min    Comment: (NOTE) The eGFR has been calculated using the CKD EPI equation. This calculation has not been validated in all clinical situations. eGFR's persistently <60 mL/min signify possible Chronic Kidney Disease.   Troponin I     Status: Abnormal   Collection Time:  07/19/17  9:01 PM  Result Value Ref Range   Troponin I 0.05 (HH) <0.03 ng/mL    Comment: CRITICAL VALUE NOTED.  VALUE IS CONSISTENT WITH PREVIOUSLY REPORTED AND CALLED VALUE.  Influenza panel by PCR (type A & B)     Status: None   Collection Time: 07/19/17 11:03 PM  Result Value Ref Range   Influenza A By PCR NEGATIVE NEGATIVE   Influenza B By PCR NEGATIVE NEGATIVE    Comment: (NOTE) The Xpert Xpress Flu assay is intended as an aid in the diagnosis of  influenza and should not be used as  a sole basis for treatment.  This  assay is FDA approved for nasopharyngeal swab specimens only. Nasal  washings and aspirates are unacceptable for Xpert Xpress Flu testing.   MRSA PCR Screening     Status: None   Collection Time: 07/19/17 11:03 PM  Result Value Ref Range   MRSA by PCR NEGATIVE NEGATIVE    Comment:        The GeneXpert MRSA Assay (FDA approved for NASAL specimens only), is one component of a comprehensive MRSA colonization surveillance program. It is not intended to diagnose MRSA infection nor to guide or monitor treatment for MRSA infections.   Basic metabolic panel     Status: Abnormal   Collection Time: 07/20/17  1:48 AM  Result Value Ref Range   Sodium 139 135 - 145 mmol/L   Potassium 4.2 3.5 - 5.1 mmol/L   Chloride 110 101 - 111 mmol/L   CO2 22 22 - 32 mmol/L   Glucose, Bld 125 (H) 65 - 99 mg/dL   BUN 86 (H) 6 - 20 mg/dL   Creatinine, Ser 4.22 (H) 0.61 - 1.24 mg/dL   Calcium 8.4 (L) 8.9 - 10.3 mg/dL   GFR calc non Af Amer 11 (L) >60 mL/min   GFR calc Af Amer 13 (L) >60 mL/min    Comment: (NOTE) The eGFR has been calculated using the CKD EPI equation. This calculation has not been validated in all clinical situations. eGFR's persistently <60 mL/min signify possible Chronic Kidney Disease.    Anion gap 7 5 - 15  CBC     Status: Abnormal   Collection Time: 07/20/17  1:48 AM  Result Value Ref Range   WBC 3.7 (L) 4.0 - 10.5 K/uL   RBC 2.99 (L) 4.22 - 5.81 MIL/uL    Hemoglobin 8.4 (L) 13.0 - 17.0 g/dL   HCT 24.9 (L) 39.0 - 52.0 %   MCV 83.3 78.0 - 100.0 fL   MCH 28.1 26.0 - 34.0 pg   MCHC 33.7 30.0 - 36.0 g/dL   RDW 15.2 11.5 - 15.5 %   Platelets 75 (L) 150 - 400 K/uL    Comment: CONSISTENT WITH PREVIOUS RESULT  Troponin I     Status: Abnormal   Collection Time: 07/20/17  1:48 AM  Result Value Ref Range   Troponin I 0.05 (HH) <0.03 ng/mL    Comment: CRITICAL VALUE NOTED.  VALUE IS CONSISTENT WITH PREVIOUSLY REPORTED AND CALLED VALUE.  D-dimer, quantitative (not at [facility])     Status: Abnormal   Collection Time: 07/20/17  1:48 AM  Result Value Ref Range   D-Dimer, Quant 1.56 (H) 0.00 - 0.50 ug/mL-FEU    Comment: (NOTE) At the manufacturer cut-off of 0.50 ug/mL FEU, this assay has been documented to exclude PE with a sensitivity and negative predictive value of 97 to 99%.  At this time, this assay has not been approved by the FDA to exclude DVT/VTE. Results should be correlated with clinical presentation.   Troponin I     Status: Abnormal   Collection Time: 07/20/17  6:38 AM  Result Value Ref Range   Troponin I 0.05 (HH) <0.03 ng/mL    Comment: CRITICAL VALUE NOTED.  VALUE IS CONSISTENT WITH PREVIOUSLY REPORTED AND CALLED VALUE.     ROS:  A comprehensive review of systems was negative except for: Constitutional: positive for fatigue Respiratory: positive for cough and dyspnea on exertion Cardiovascular: positive for irregular heart beat and lower extremity edema  Physical Exam: Vitals:  07/20/17 0500 07/20/17 0912  BP: 124/64 (!) 148/98  Pulse: 74 (!) 108  Resp: 18 18  Temp: (!) 95.6 F (35.3 C) 98 F (36.7 C)  SpO2: 99% 99%     General: Thin black male.  He is slightly difficult to understand but he is oriented and was able to give me details regarding his medical history HEENT: Pupils are equally round and reactive to light, extraocular motions are intact, mucous membranes are moist Neck: Positive for JVD Heart: Irregularly  irregular Lungs: Coarse breath sounds bilaterally and decreased breath sounds at the bases Abdomen: Soft, nontender, nondistended Extremities: Pitting edema to lower extremities Skin: Warm and dry Neuro: Alert and oriented.  Nonfocal  Assessment/Plan: 83 year old black male with advanced CKD at baseline.  He now presents with worsening shortness of breath with chest x-ray and exam consistent with volume overload 1.Renal-advanced CKD but has been present long-term.  Current GFR is 13.  Difficult to determine if he is having other uremic symptoms -  I agree and would even go as far to say that I would not start him on dialysis as I cannot anticipate a good outcome for an 83 year old on dialysis 2. Hypertension/volume  -volume overloaded.  However, urine output is not bad with not much Lasix.  I will give him gentle Lasix at 40 mg IV every 12 hours for the next 24 hours and see if we achieve diuresis.  He is also on low-dose Lopressor probably more so for his A. fib than his blood pressure 3.  Pneumonia-I suspect these chest x-ray findings are more due to volume than pneumonia.  Continue Maxipime and vancomycin for now 4. Anemia  -significant anemia-he claims that this is not new.  He tells me that he was not on an ESA as an outpatient.  I will check iron stores and replete as needed and also add ESA   Concepcion Gillott A 07/20/2017, 3:07 PM

## 2017-07-20 NOTE — Progress Notes (Signed)
Pt is a-fib/a-flutter with HR in 110s. BP is 134/94. O2 sat 99% on 2L [place]. Jeannette Corpus, NP notified

## 2017-07-20 NOTE — Telephone Encounter (Signed)
No call back from patient's daughter. He is scheduled with Dr. Harrington Challenger this week, 07/23/17.

## 2017-07-21 ENCOUNTER — Other Ambulatory Visit: Payer: Self-pay

## 2017-07-21 DIAGNOSIS — I5032 Chronic diastolic (congestive) heart failure: Secondary | ICD-10-CM

## 2017-07-21 DIAGNOSIS — I480 Paroxysmal atrial fibrillation: Secondary | ICD-10-CM

## 2017-07-21 LAB — CBC
HCT: 26.5 % — ABNORMAL LOW (ref 39.0–52.0)
Hemoglobin: 8.9 g/dL — ABNORMAL LOW (ref 13.0–17.0)
MCH: 28 pg (ref 26.0–34.0)
MCHC: 33.6 g/dL (ref 30.0–36.0)
MCV: 83.3 fL (ref 78.0–100.0)
PLATELETS: 94 10*3/uL — AB (ref 150–400)
RBC: 3.18 MIL/uL — ABNORMAL LOW (ref 4.22–5.81)
RDW: 15.1 % (ref 11.5–15.5)
WBC: 3.9 10*3/uL — AB (ref 4.0–10.5)

## 2017-07-21 LAB — FERRITIN: Ferritin: 241 ng/mL (ref 24–336)

## 2017-07-21 LAB — IRON AND TIBC
Iron: 30 ug/dL — ABNORMAL LOW (ref 45–182)
SATURATION RATIOS: 12 % — AB (ref 17.9–39.5)
TIBC: 253 ug/dL (ref 250–450)
UIBC: 223 ug/dL

## 2017-07-21 LAB — BASIC METABOLIC PANEL
ANION GAP: 12 (ref 5–15)
BUN: 86 mg/dL — ABNORMAL HIGH (ref 6–20)
CALCIUM: 8.5 mg/dL — AB (ref 8.9–10.3)
CO2: 23 mmol/L (ref 22–32)
CREATININE: 4.33 mg/dL — AB (ref 0.61–1.24)
Chloride: 102 mmol/L (ref 101–111)
GFR, EST AFRICAN AMERICAN: 12 mL/min — AB (ref >60)
GFR, EST NON AFRICAN AMERICAN: 10 mL/min — AB (ref >60)
Glucose, Bld: 121 mg/dL — ABNORMAL HIGH (ref 65–99)
Potassium: 4 mmol/L (ref 3.5–5.1)
SODIUM: 137 mmol/L (ref 135–145)

## 2017-07-21 LAB — LEGIONELLA PNEUMOPHILA SEROGP 1 UR AG: L. PNEUMOPHILA SEROGP 1 UR AG: NEGATIVE

## 2017-07-21 MED ORDER — SODIUM CHLORIDE 0.9 % IV SOLN
125.0000 mg | Freq: Every day | INTRAVENOUS | Status: AC
  Administered 2017-07-21 – 2017-07-24 (×4): 125 mg via INTRAVENOUS
  Filled 2017-07-21 (×4): qty 10

## 2017-07-21 NOTE — Evaluation (Addendum)
Occupational Therapy Evaluation Patient Details Name: Johnny Morrow MRN: [id number] DOB: 07-27-1920 Today's Date: 07/21/2017    History of Present Illness This 83 y.o. male admitted with worsening SOB.  Dx HCAP.  PMH includes:    paroxysmal A-Fib, chronic diastolic CHF, h/o DVT, h/o A-flutter, CKD stage IV   Clinical Impression   Pt admitted with above. He demonstrates the below listed deficits and will benefit from continued OT to maximize safety and independence with BADLs.  Pt presents to OT with impaired balance, generalized weakness, decreased activity tolerance.  He currently requires min A for functional mobility and min - max A for ADLs.  He lives at [facility], but has been in SNF recently.  He prefers to discharge home with wife.  He ultimately would benefit from post acute rehab.  He does not want to return to SNF.  Question if he would benefit from CIR.       Follow Up Recommendations  CIR;Supervision/Assistance - 24 hour .    Equipment Recommendations  None recommended by OT    Recommendations for Other Services       Precautions / Restrictions Precautions Precautions: Fall      Mobility Bed Mobility               General bed mobility comments: Pt sitting up in chair   Transfers Overall transfer level: Needs assistance Equipment used: Rolling walker (2 wheeled) Transfers: Sit to/from [facility] Sit to Stand: Min assist Stand pivot transfers: Min assist       General transfer comment: assist to steady     Balance Overall balance assessment: Needs assistance Sitting-balance support: Feet supported Sitting balance-Leahy Scale: Fair     Standing balance support: Bilateral upper extremity supported Standing balance-Leahy Scale: Poor Standing balance comment: requires UE support                            ADL either performed or assessed with clinical judgement   ADL Overall ADL's : Needs  assistance/impaired Eating/Feeding: Independent   Grooming: Wash/dry face;Wash/dry hands;Oral care;Set up;Sitting   Upper Body Bathing: Minimal assistance;Sitting   Lower Body Bathing: Maximal assistance;Sit to/from stand   Upper Body Dressing : Minimal assistance;Sitting   Lower Body Dressing: Maximal assistance;Sit to/from stand Lower Body Dressing Details (indicate cue type and reason): Pt reports he uses AE at home  Toilet Transfer: Minimal assistance;Ambulation;Comfort height toilet;RW   Toileting- Clothing Manipulation and Hygiene: Minimal assistance;Sit to/from stand       Functional mobility during ADLs: Minimal assistance;Rolling walker General ADL Comments: fatigues quickly      Vision         Perception     Praxis      Pertinent Vitals/Pain Pain Assessment: No/denies pain     Hand Dominance Right   Extremity/Trunk Assessment Upper Extremity Assessment Upper Extremity Assessment: Generalized weakness   Lower Extremity Assessment Lower Extremity Assessment: Defer to PT evaluation   Cervical / Trunk Assessment Cervical / Trunk Assessment: Normal   Communication Communication Communication: Expressive difficulties(slurred speech )   Cognition Arousal/Alertness: Awake/alert Behavior During Therapy: (irritable ) Overall Cognitive Status: Within Functional Limits for tasks assessed                                     General Comments  02 sats 97% on RA, decreased to 73%  with activity, quickly returned to 94% on 2L     Exercises     Shoulder Instructions      Home Living Family/patient expects to be discharged to:: Private residence Living Arrangements: Spouse/significant other Available Help at Discharge: Family Type of Home: Other(Comment)([facility] ) Home Access: Level entry     Home Layout: One level     Bathroom Shower/Tub: Tub/shower unit;Walk-in shower   Bathroom Toilet: Handicapped height     Home  Equipment: Cane - single point;Grab bars - tub/shower;Walker - 2 wheels;Adaptive equipment Adaptive Equipment: Reacher;Sock aid;Long-handled shoe horn Additional Comments: Pt reports he has been at SNF at [facility], but plans to return to his [facility] with his wife at discharge.       Prior Functioning/Environment Level of Independence: Needs assistance  Gait / Transfers Assistance Needed: ambulates with RW ADL's / Homemaking Assistance Needed: Pt reports he is independent with ADLs except donning support hose    Comments: Pt reports he was ambulating with RW at SNF, and was getting ready to progress toward a SPC         OT Problem List: Decreased strength;Decreased activity tolerance;Impaired balance (sitting and/or standing);Decreased safety awareness;Decreased knowledge of use of DME or AE;Cardiopulmonary status limiting activity      OT Treatment/Interventions: Self-care/ADL training;Therapeutic exercise;DME and/or AE instruction;Therapeutic activities;Patient/family education;Balance training;Energy conservation    OT Goals(Current goals can be found in the care plan section) Acute Rehab OT Goals Patient Stated Goal: to go home  OT Goal Formulation: With patient Time For Goal Achievement: 08/04/17 Potential to Achieve Goals: Good ADL Goals Pt Will Perform Grooming: with supervision;standing Pt Will Perform Upper Body Bathing: with set-up;sitting Pt Will Perform Lower Body Bathing: with supervision;sit to/from stand;with adaptive equipment Pt Will Perform Upper Body Dressing: with set-up;with supervision;sitting Pt Will Perform Lower Body Dressing: with supervision;sit to/from stand Pt Will Transfer to Toilet: with min guard assist;ambulating;grab bars Pt Will Perform Toileting - Clothing Manipulation and hygiene: with supervision;sit to/from stand Pt Will Perform Tub/Shower Transfer: Shower transfer;with min guard assist;rolling walker;shower seat;ambulating;grab bars  OT  Frequency: Min 2X/week   Barriers to D/C: Decreased caregiver support  unsure wife can provide necessary level of assist        Co-evaluation              AM-PAC PT "6 Clicks" Daily Activity     Outcome Measure Help from another person eating meals?: None Help from another person taking care of personal grooming?: A Little Help from another person toileting, which includes using toliet, bedpan, or urinal?: A Little Help from another person bathing (including washing, rinsing, drying)?: A Lot Help from another person to put on and taking off regular upper body clothing?: A Little Help from another person to put on and taking off regular lower body clothing?: A Lot 6 Click Score: 17   End of Session Equipment Utilized During Treatment: Rolling walker;Oxygen Nurse Communication: Mobility status  Activity Tolerance: Patient limited by fatigue Patient left: Other (comment)(with PT )  OT Visit Diagnosis: Unsteadiness on feet (R26.81)                Time: 1610-9604   5409-8119 OT Time Calculation (min): 15 min and 21 mins Charges:  OT General Charges $OT Visit: 2 Visit OT Evaluation $OT Eval Moderate Complexity: 1 Mod OT Treatments $Therapeutic Activity: 8-22 mins G-Codes:     [facility], OTR/L [phone]   Lucille Passy M 07/21/2017, 3:09 PM

## 2017-07-21 NOTE — Progress Notes (Signed)
[facility] PROGRESS NOTE  Johnny Morrow TMA:[id number] DOB: May 12, 1920 DOA: 07/19/2017  PCP: Wenda Low, MD  Brief History/Interval Summary: 83 year old African-American male with a past medical history of chronic diastolic CHF paroxysmal atrial fibrillation not on anticoagulation due to severe retroperitoneal bleed in the recent past, history of hypertension, history of DVT in the past, history of atrial flutter, chronic kidney disease stage IV followed at [facility] who lives in a nursing facility and presented with worsening shortness of breath.  Concern was for healthcare associated pneumonia.  Patient was hospitalized for further management.  There was also concern for volume overload.  Due to his chronic kidney disease nephrology was consulted to assist with management.  Reason for Visit: Acute respiratory failure with hypoxia  Consultants: Nephrology  Procedures: None  Antibiotics: Vancomycin and cefepime  Subjective/Interval History: Sitting on the chair.  Denies any complaints.  ROS: No nausea or vomiting.  Objective:  Vital Signs  Vitals:   07/20/17 1718 07/20/17 2108 07/21/17 0500 07/21/17 0817  BP: (!) 145/76 (!) 143/88 139/90 139/90  Pulse: 70 (!) 101 (!) 109 (!) 109  Resp: 18 19 18 18   Temp: (!) 97.5 F (36.4 C) 98.2 F (36.8 C) 97.7 F (36.5 C) 97.7 F (36.5 C)  TempSrc: Oral Oral Oral Oral  SpO2: 98% 98% 92%   Weight:  65 kg (143 lb 4.8 oz)  65 kg (143 lb 4.8 oz)  Height:    5\' 5"  (1.651 m)    Intake/Output Summary (Last 24 hours) at 07/21/2017 1032 Last data filed at 07/21/2017 0745 Gross per 24 hour  Intake 570 ml  Output 1050 ml  Net -480 ml   Filed Weights   07/19/17 2103 07/20/17 2108 07/21/17 0817  Weight: 64.7 kg (142 lb 10.2 oz) 65 kg (143 lb 4.8 oz) 65 kg (143 lb 4.8 oz)    General appearance: Awake.  No distress. Resp: Improved air entry bilaterally.  Continues to have crackles at the bases.  No wheezing or  rhonchi. Cardio: S1-S2 is normal regular.  No S3-S4.  Systolic murmur appreciated over the precordium. GI: Abdomen is soft.  Nontender nondistended.  Bowel sounds are present.  No masses organomegaly Extremities: 1+ edema Neurologic: No focal neurological deficits.  Lab Results:  Data Reviewed: I have personally reviewed following labs and imaging studies  CBC: Recent Labs  Lab 07/19/17 1546 07/19/17 2101 07/20/17 0148 07/21/17 0512  WBC 5.9 4.7 3.7* 3.9*  NEUTROABS 3.3  --   --   --   HGB 9.1* 8.5* 8.4* 8.9*  HCT 27.2* 25.6* 24.9* 26.5*  MCV 84.5 83.4 83.3 83.3  PLT 85* 83* 75* 94*    Basic Metabolic Panel: Recent Labs  Lab 07/19/17 1546 07/19/17 2101 07/20/17 0148 07/21/17 0512  NA 139  --  139 137  K 4.2  --  4.2 4.0  CL 108  --  110 102  CO2 22  --  22 23  GLUCOSE 166*  --  125* 121*  BUN 89*  --  86* 86*  CREATININE 4.30* 4.40* 4.22* 4.33*  CALCIUM 8.6*  --  8.4* 8.5*    GFR: Estimated Creatinine Clearance: 8.9 mL/min (A) (by C-G formula based on SCr of 4.33 mg/dL (H)).  Cardiac Enzymes: Recent Labs  Lab 07/19/17 1546 07/19/17 2101 07/20/17 0148 07/20/17 0638  TROPONINI 0.04* 0.05* 0.05* 0.05*      Recent Results (from the past 240 hour(s))  MRSA PCR Screening     Status: None  Collection Time: 07/19/17 11:03 PM  Result Value Ref Range Status   MRSA by PCR NEGATIVE NEGATIVE Final    Comment:        The GeneXpert MRSA Assay (FDA approved for NASAL specimens only), is one component of a comprehensive MRSA colonization surveillance program. It is not intended to diagnose MRSA infection nor to guide or monitor treatment for MRSA infections.       Radiology Studies: Nm Pulmonary Perf And Vent  Result Date: 07/20/2017 CLINICAL DATA:  Positive D-dimer EXAM: NUCLEAR MEDICINE VENTILATION - PERFUSION LUNG SCAN TECHNIQUE: Ventilation images were obtained in multiple projections using inhaled aerosol Tc-38m DTPA. Perfusion images were obtained  in multiple projections after intravenous injection of Tc-58m MAA. RADIOPHARMACEUTICALS:  32 mCi Technetium-74m DTPA aerosol inhalation and 4.2 mCi Technetium-78m MAA IV COMPARISON:  05/16/2017 and chest x-ray from previous day FINDINGS: Ventilation: The overall ventilation is decrease when compared with the prior exam although no large defect is seen. Some central trapping is noted which can be seen with COPD. Perfusion: Perfusion imaging demonstrates some slight decrease activity in the bases bilaterally related to infiltrative changes seen on the recent chest x-ray. No definitive undulation perfusion mismatch is noted to suggest pulmonary embolism. IMPRESSION: Slight decreased ventilation and perfusion in the bases bilaterally related to recent infiltrates on chest x-ray. No definitive ventilation-perfusion mismatch is noted to suggest pulmonary embolism. Electronically Signed   By: Inez Catalina M.D.   On: 07/20/2017 14:58   Dg Chest Portable 1 View  Result Date: 07/19/2017 CLINICAL DATA:  Acute shortness of breath and wheezing. EXAM: PORTABLE CHEST 1 VIEW COMPARISON:  06/15/2017 and prior chest radiograph FINDINGS: Cardiomegaly and pulmonary vascular congestion noted Increased bilateral lower lung opacities/ airspace disease noted. Probable trace right pleural effusion noted. There is no evidence of pneumothorax or acute bony abnormality. IMPRESSION: Increasing bilateral lower lung opacities -question atelectasis and/or airspace disease/ pneumonia. Probable trace right pleural effusion. Cardiomegaly and pulmonary vascular congestion. Electronically Signed   By: Margarette Canada M.D.   On: 07/19/2017 16:10     Medications:  Scheduled: . aspirin EC  81 mg Oral BID  . ceFEPime (MAXIPIME) IV  500 mg Intravenous Q24H  . cholecalciferol  5,000 Units Oral Daily  . diltiazem  360 mg Oral Daily  . doxazosin  1 mg Oral QPM  . ferrous sulfate  325 mg Oral Q breakfast  . furosemide  40 mg Intravenous Q12H  .  isosorbide mononitrate  30 mg Oral Daily  . Melatonin  9 mg Oral QHS  . metoprolol tartrate  12.5 mg Oral BID  . omega-3 acid ethyl esters  2 g Oral Daily  . polyethylene glycol  34 g Oral Daily  . senna  1 tablet Oral q morning - 10a   Continuous: . vancomycin     DUK:[id number] **OR** acetaminophen, bisacodyl, levalbuterol, lip balm, metoprolol tartrate, ondansetron **OR** ondansetron (ZOFRAN) IV, traMADol  Assessment/Plan:  Principal Problem:   HCAP (healthcare-associated pneumonia) Active Problems:   Essential hypertension, benign   Chronic kidney disease (CKD), stage IV (severe) (HCC)   AF (paroxysmal atrial fibrillation) (HCC)   Chronic diastolic CHF (congestive heart failure) (HCC)   Pneumonia    Acute respiratory failure with hypoxia Etiology likely multifactorial including healthcare associated pneumonia and volume overload due to chronic kidney disease.  Patient also has a history of diastolic CHF.  He has gained significant amount of weight over the last 2 months.  Currently getting intravenous Lasix.  Monitor ins and outs.  Daily weights.  Continue oxygen.  VQ scan did not suggest PE.  Healthcare associated pneumonia Patient remains on vancomycin and cefepime.  Unfortunately no blood cultures were sent at the time of admission.  Discontinue vancomycin and continue just cefepime for now.  Influenza PCR negative.  Strep pneumonia urinary antigen negative.  Chronic kidney disease stage IV Followed by nephrology at [facility].  Patient has been on diuretics at home.  However he has gained significant amount of weight loss few weeks.  Nephrology consulted.  Appreciate their input.  Patient is on IV Lasix.  Acute on chronic diastolic CHF Fluid overload likely due to combination of CKD as well as CHF.  As discussed above.  Echocardiogram from 2017 showed normal systolic function.  Strict ins and outs.  Daily weights.  History of paroxysmal atrial  fibrillation Monitor on telemetry.  Continue metoprolol and Cardizem.  Patient not on anticoagulation due to history of severe retroperitoneal bleed.  Anemia of chronic kidney disease Follow CBC closely.  No evidence of overt bleeding.  History of prostate cancer in remission Stable.  Thrombocytopenia  Platelet count was noted to be low.  This appears to be new compared to previous hospitalization.  Etiology is unclear.  Counts are stable.  No evidence of bleeding.   DVT Prophylaxis: SCDs.    Code Status: Remains full code.   Family Communication: Discussed with wife Disposition Plan: Management as outlined above.  Likely will return back to his nursing facility at discharge.    LOS: 2 days   [facility] Pager [phone] 07/21/2017, 10:32 AM  If 7PM-7AM, please contact night-coverage at [email or web address], password [email or web address]

## 2017-07-21 NOTE — Progress Notes (Signed)
Patients IV came out at approximately 1230. Johnny Peach, RN tried X3 unsuccessfully. IV team paged. When IV team came patient was up in chair eating and requested that they come back. OT began working with patient when he was done eating. Patient now back in bed awaiting IV team to place IV. Will continue to monitor.

## 2017-07-21 NOTE — Progress Notes (Signed)
Subjective:  1500 of UOP yest- overall neg 750 but says his breathing  feels much better- crt stable Objective Vital signs in last 24 hours: Vitals:   07/21/17 0500 07/21/17 0817 07/21/17 1100 07/21/17 1102  BP: 139/90 139/90 (!) 144/98 (!) 144/98  Pulse: (!) 109 (!) 109  86  Resp: 18 18    Temp: 97.7 F (36.5 C) 97.7 F (36.5 C)    TempSrc: Oral Oral    SpO2: 92%     Weight:  65 kg (143 lb 4.8 oz)    Height:  5\' 5"  (1.651 m)     Weight change: 0.3 kg (10.6 oz)  Intake/Output Summary (Last 24 hours) at 07/21/2017 1136 Last data filed at 07/21/2017 0745 Gross per 24 hour  Intake 570 ml  Output 825 ml  Net -255 ml    Assessment/Plan: 83 year old black male with advanced CKD at baseline.  He now presents with worsening shortness of breath with chest x-ray and exam consistent with volume overload 1.Renal-advanced CKD but has been present long-term.  Current GFR is 13.  Difficult to determine if he is having other uremic symptoms -  I agree and would even go as far to say that I would not start him on dialysis as I cannot anticipate a good outcome for an 83 year old on dialysis 2. Hypertension/volume  -volume overloaded.  However, urine output is reasonable Lasix at 40 mg IV every 12 hours - will cont.  He is also on low-dose Lopressor probably more so for his A. fib than his blood pressure 3.  Pneumonia-I suspect these chest x-ray findings are more due to volume than pneumonia.  Continue Maxipime and vancomycin for now 4. Anemia  -significant anemia-he claims that this is not new.  He tells me that he was not on an ESA as an outpatient.  iron stores low, will replete  -  also added ESA- is stable as well      Cailan Antonucci A    Labs: Basic Metabolic Panel: Recent Labs  Lab 07/19/17 1546 07/19/17 2101 07/20/17 0148 07/21/17 0512  NA 139  --  139 137  K 4.2  --  4.2 4.0  CL 108  --  110 102  CO2 22  --  22 23  GLUCOSE 166*  --  125* 121*  BUN 89*  --  86* 86*   CREATININE 4.30* 4.40* 4.22* 4.33*  CALCIUM 8.6*  --  8.4* 8.5*   Liver Function Tests: No results for input(s): AST, ALT, ALKPHOS, BILITOT, PROT, ALBUMIN in the last 168 hours. No results for input(s): LIPASE, AMYLASE in the last 168 hours. No results for input(s): AMMONIA in the last 168 hours. CBC: Recent Labs  Lab 07/19/17 1546 07/19/17 2101 07/20/17 0148 07/21/17 0512  WBC 5.9 4.7 3.7* 3.9*  NEUTROABS 3.3  --   --   --   HGB 9.1* 8.5* 8.4* 8.9*  HCT 27.2* 25.6* 24.9* 26.5*  MCV 84.5 83.4 83.3 83.3  PLT 85* 83* 75* 94*   Cardiac Enzymes: Recent Labs  Lab 07/19/17 1546 07/19/17 2101 07/20/17 0148 07/20/17 0638  TROPONINI 0.04* 0.05* 0.05* 0.05*   CBG: No results for input(s): GLUCAP in the last 168 hours.  Iron Studies:  Recent Labs    07/21/17 0512  IRON 30*  TIBC 253  FERRITIN 241   Studies/Results: Nm Pulmonary Perf And Vent  Result Date: 07/20/2017 CLINICAL DATA:  Positive D-dimer EXAM: NUCLEAR MEDICINE VENTILATION - PERFUSION LUNG SCAN TECHNIQUE: Ventilation images were obtained  in multiple projections using inhaled aerosol Tc-82m DTPA. Perfusion images were obtained in multiple projections after intravenous injection of Tc-57m MAA. RADIOPHARMACEUTICALS:  32 mCi Technetium-66m DTPA aerosol inhalation and 4.2 mCi Technetium-35m MAA IV COMPARISON:  05/16/2017 and chest x-ray from previous day FINDINGS: Ventilation: The overall ventilation is decrease when compared with the prior exam although no large defect is seen. Some central trapping is noted which can be seen with COPD. Perfusion: Perfusion imaging demonstrates some slight decrease activity in the bases bilaterally related to infiltrative changes seen on the recent chest x-ray. No definitive undulation perfusion mismatch is noted to suggest pulmonary embolism. IMPRESSION: Slight decreased ventilation and perfusion in the bases bilaterally related to recent infiltrates on chest x-ray. No definitive  ventilation-perfusion mismatch is noted to suggest pulmonary embolism. Electronically Signed   By: Inez Catalina M.D.   On: 07/20/2017 14:58   Dg Chest Portable 1 View  Result Date: 07/19/2017 CLINICAL DATA:  Acute shortness of breath and wheezing. EXAM: PORTABLE CHEST 1 VIEW COMPARISON:  06/15/2017 and prior chest radiograph FINDINGS: Cardiomegaly and pulmonary vascular congestion noted Increased bilateral lower lung opacities/ airspace disease noted. Probable trace right pleural effusion noted. There is no evidence of pneumothorax or acute bony abnormality. IMPRESSION: Increasing bilateral lower lung opacities -question atelectasis and/or airspace disease/ pneumonia. Probable trace right pleural effusion. Cardiomegaly and pulmonary vascular congestion. Electronically Signed   By: Margarette Canada M.D.   On: 07/19/2017 16:10   Medications: Infusions: . vancomycin      Scheduled Medications: . aspirin EC  81 mg Oral BID  . ceFEPime (MAXIPIME) IV  500 mg Intravenous Q24H  . cholecalciferol  5,000 Units Oral Daily  . diltiazem  360 mg Oral Daily  . doxazosin  1 mg Oral QPM  . ferrous sulfate  325 mg Oral Q breakfast  . furosemide  40 mg Intravenous Q12H  . isosorbide mononitrate  30 mg Oral Daily  . Melatonin  9 mg Oral QHS  . metoprolol tartrate  12.5 mg Oral BID  . omega-3 acid ethyl esters  2 g Oral Daily  . polyethylene glycol  34 g Oral Daily  . senna  1 tablet Oral q morning - 10a    have reviewed scheduled and prn medications.  Physical Exam: General: sitting up in chair Heart: RRR Lungs: CBS bilat Abdomen: soft, non tender Extremities: pitting edema    07/21/2017,11:36 AM  LOS: 2 days

## 2017-07-21 NOTE — Progress Notes (Signed)
Rehab Admissions Coordinator Note:  Patient was screened by Retta Diones for appropriateness for an Inpatient Acute Rehab Consult.  At this time, we are recommending Inpatient Rehab consult.  Retta Diones 07/21/2017, 3:54 PM  I can be reached at [phone].

## 2017-07-21 NOTE — Evaluation (Signed)
Physical Therapy Evaluation Patient Details Name: Johnny Morrow MRN: [id number] DOB: [birth year] Today's Date: 07/21/2017   History of Present Illness  This 83 y.o. male admitted with worsening SOB.  Dx HCAP.  PMH includes:    paroxysmal A-Fib, chronic diastolic CHF, h/o DVT, h/o A-flutter, CKD stage IV  Clinical Impression  Pt was seen for assessment of mobility after OT had been in to see him.  Pt is willing to work but has low endurance and some strength limitations of LE's that are affecting his quality of gait and safety.   Has some reduction in endurance along with O2 sats dropping and requires supplemental O2.  Will continue to see pt as an inpt and will then progress to SNF to get more rehab done in faster time frame.  He is also needing some closer supervision and will therefore need to get the SNF treatment for safety and to prevent loss of progress.    Follow Up Recommendations SNF    Equipment Recommendations  None recommended by PT    Recommendations for Other Services       Precautions / Restrictions Precautions Precautions: Fall Restrictions Weight Bearing Restrictions: No      Mobility  Bed Mobility               General bed mobility comments: up when PT arrived  Transfers Overall transfer level: Needs assistance Equipment used: Rolling walker (2 wheeled) Transfers: Sit to/from Stand Sit to Stand: Min guard;Min assist         General transfer comment: supervised for safety  Ambulation/Gait Ambulation/Gait assistance: Min guard;Min assist Ambulation Distance (Feet): 100 Feet Assistive device: Rolling walker (2 wheeled);1 person hand held assist Gait Pattern/deviations: Step-to pattern;Step-through pattern;Decreased stride length;Narrow base of support;Trunk flexed Gait velocity: reduced Gait velocity interpretation: Below normal speed for age/gender    Stairs            Wheelchair Mobility    Modified Rankin (Stroke Patients Only)        Balance Overall balance assessment: Needs assistance Sitting-balance support: Feet supported Sitting balance-Leahy Scale: Fair     Standing balance support: Bilateral upper extremity supported;During functional activity Standing balance-Leahy Scale: Poor Standing balance comment: cued set up for sitting and standing                             Pertinent Vitals/Pain Pain Assessment: No/denies pain    Home Living Family/patient expects to be discharged to:: Private residence Living Arrangements: Spouse/significant other Available Help at Discharge: Family Type of Home: Other(Comment) Home Access: Level entry     Home Layout: One level Home Equipment: Cane - single point;Grab bars - tub/shower;Walker - 2 wheels;Adaptive equipment Additional Comments: has been in SNF already and hoping to go directly home    Prior Function Level of Independence: Needs assistance   Gait / Transfers Assistance Needed: RW for gait recently  ADL's / Homemaking Assistance Needed: Lives in I living and has wife there to help  Comments: Pt states he was I with gait prior to his admission to SNF     Hand Dominance   Dominant Hand: Right    Extremity/Trunk Assessment   Upper Extremity Assessment Upper Extremity Assessment: Generalized weakness    Lower Extremity Assessment Lower Extremity Assessment: Generalized weakness    Cervical / Trunk Assessment Cervical / Trunk Assessment: Normal  Communication   Communication: Expressive difficulties  Cognition Arousal/Alertness: Awake/alert Behavior During Therapy: [facility]  for tasks assessed/performed Overall Cognitive Status: Within Functional Limits for tasks assessed                                        General Comments General comments (skin integrity, edema, etc.): Maintained O2 sats with 3 L O2 on tank air with cannula    Exercises     Assessment/Plan    PT Assessment Patient needs continued PT services   PT Problem List Decreased strength;Decreased range of motion;Decreased activity tolerance;Decreased balance;Decreased coordination;Decreased mobility;Decreased knowledge of use of DME;Decreased safety awareness;Cardiopulmonary status limiting activity;Decreased skin integrity       PT Treatment Interventions DME instruction;Gait training;Functional mobility training;Therapeutic activities;Therapeutic exercise;Balance training;Neuromuscular re-education;Patient/family education    PT Goals (Current goals can be found in the Care Plan section)  Acute Rehab PT Goals Patient Stated Goal: to go home  PT Goal Formulation: With patient Time For Goal Achievement: 08/04/17 Potential to Achieve Goals: Good    Frequency Min 2X/week   Barriers to discharge Other (comment) will need assistance with all gait and with O2 tank every time he walks for near future    Co-evaluation               AM-PAC PT "6 Clicks" Daily Activity  Outcome Measure Difficulty turning over in bed (including adjusting bedclothes, sheets and blankets)?: A Little Difficulty moving from lying on back to sitting on the side of the bed? : Unable Difficulty sitting down on and standing up from a chair with arms (e.g., wheelchair, bedside commode, etc,.)?: Unable Help needed moving to and from a bed to chair (including a wheelchair)?: A Little Help needed walking in hospital room?: A Little Help needed climbing 3-5 steps with a railing? : Total 6 Click Score: 12    End of Session Equipment Utilized During Treatment: Gait belt;Oxygen Activity Tolerance: Patient tolerated treatment well;Patient limited by fatigue;Treatment limited secondary to medical complications (Comment)(O2 sats required supplemental O2) Patient left: in bed;with call bell/phone within reach;with bed alarm set Nurse Communication: Mobility status PT Visit Diagnosis: Unsteadiness on feet (R26.81);Muscle weakness (generalized) (M62.81);Difficulty in  walking, not elsewhere classified (R26.2);Adult, failure to thrive (R62.7)    Time: 3664-4034 PT Time Calculation (min) (ACUTE ONLY): 14 min   Charges:   PT Evaluation $PT Eval Moderate Complexity: 1 Mod     PT G Codes:   PT G-Codes **NOT FOR INPATIENT CLASS** Functional Assessment Tool Used: AM-PAC 6 Clicks Basic Mobility   Ramond Dial 07/21/2017, 9:06 PM   Mee Hives, PT MS Acute Rehab Dept. Number: [place]

## 2017-07-22 LAB — BASIC METABOLIC PANEL
Anion gap: 13 (ref 5–15)
BUN: 88 mg/dL — ABNORMAL HIGH (ref 6–20)
CHLORIDE: 104 mmol/L (ref 101–111)
CO2: 22 mmol/L (ref 22–32)
CREATININE: 4.4 mg/dL — AB (ref 0.61–1.24)
Calcium: 8.8 mg/dL — ABNORMAL LOW (ref 8.9–10.3)
GFR, EST AFRICAN AMERICAN: 12 mL/min — AB (ref >60)
GFR, EST NON AFRICAN AMERICAN: 10 mL/min — AB (ref >60)
Glucose, Bld: 101 mg/dL — ABNORMAL HIGH (ref 65–99)
POTASSIUM: 3.8 mmol/L (ref 3.5–5.1)
SODIUM: 139 mmol/L (ref 135–145)

## 2017-07-22 LAB — CBC
HCT: 29.2 % — ABNORMAL LOW (ref 39.0–52.0)
HEMOGLOBIN: 9.6 g/dL — AB (ref 13.0–17.0)
MCH: 27.6 pg (ref 26.0–34.0)
MCHC: 32.9 g/dL (ref 30.0–36.0)
MCV: 83.9 fL (ref 78.0–100.0)
Platelets: 88 10*3/uL — ABNORMAL LOW (ref 150–400)
RBC: 3.48 MIL/uL — ABNORMAL LOW (ref 4.22–5.81)
RDW: 15.1 % (ref 11.5–15.5)
WBC: 4.2 10*3/uL (ref 4.0–10.5)

## 2017-07-22 MED ORDER — FUROSEMIDE 10 MG/ML IJ SOLN
80.0000 mg | Freq: Two times a day (BID) | INTRAMUSCULAR | Status: DC
  Administered 2017-07-22 – 2017-07-24 (×4): 80 mg via INTRAVENOUS
  Filled 2017-07-22 (×4): qty 8

## 2017-07-22 NOTE — Clinical Social Work Note (Addendum)
Received call from Mollie Germany [phone]) with [facility] regarding patient and readiness for discharge. She also asked about patient's current code status and if there were any discussions with patient regarding changing his code status. Butch Penny was advised that she will be informed when patient is ready for discharge and if he will be returning to rehab versus back to his [place]. Butch Penny advised that PT has evaluated patient and is recommending SNF.  CSW talked with patient and wife at the bedside regarding discharge disposition and patient is adamant that he wants to return to his home at discharge. Wife at the bedside and is in agreement. They want appropriate home health services when he discharges.  10:19 am: Call made to nurse case manger Ricki Miller regarding patient and [facility] request. She will call wife in the room.  MD on the unit and updated regarding patient's intent to d/c home when medically stable instead of back to rehab. CSW will continue to follow and offer SW intervention services as needed through discharge.  Briseyda Fehr Givens, MSW, LCSW Licensed Clinical Social Worker [place] [phone]

## 2017-07-22 NOTE — Care Management Note (Signed)
Case Management Note  Patient Details  Name: Johnny Morrow MRN: [id number] Date of Birth: 10/21/1919  Subjective/Objective:   83 yr old gentleman admitted with shortness of breath and wheezing.                  Action/Plan: Case manager spoke with Dr. Mindi Junker and his wife concerning discharge plan. He says that they live at [facility], but he is not interested in going to the [facility], nor do they want to use [facility] .  Mrs. Woodrick says that they have recently used [facility] and would like to do so now. Case manager called referral to Miguel Dibble, RN, Liaison with [name]. Patient qualifies for Home first Program, and will be assessed for additional services. Case manager also spoke with Caryl Pina at [facility] [phone], notified her that patient will have independent Home Care services.    Expected Discharge Date:   07/24/17               Expected Discharge Plan:  [place]  In-House Referral:  NA  Discharge planning Services  CM Consult  Post Acute Care Choice:  Home Health Choice offered to:  Spouse, Patient  DME Arranged:  N/A(has DME) DME Agency:  NA  HH Arranged:  PT, OT, NA HH Agency:  [place]  Status of Service:  Completed, signed off  If discussed at [place] of Stay Meetings, dates discussed:    Additional Comments:  Ninfa Meeker, RN 07/22/2017, 1:45 PM

## 2017-07-22 NOTE — Progress Notes (Signed)
Thank you for consult on Mr. Johnny Morrow. Chart reviewed and not that patient was admitted with HCAP and currently with limitations due due hypoxia. He is at min guard assist overall and agree with recommendations of SNF at [facility] to transition him back to independent living. Will defer CIR consult.

## 2017-07-22 NOTE — NC FL2 (Signed)
[place] LEVEL OF CARE SCREENING TOOL     IDENTIFICATION  Patient Name: Johnny Morrow Birthdate: 1920-06-21 Sex: male Admission Date (Current Location): 07/19/2017  [facility] and [place] Number:  Herbalist and Address:  [facility], [place] [address] [place], [place] [address]      Provider Number: [id number]  Attending Physician Name and Address:  Dessa Phi, DO  Relative Name and Phone Number:  Deloris Ping, [phone]; Daughter Dia Jefferys - [phone]    Current Level of Care: Hospital Recommended Level of Care: [place] Prior Approval Number:    Date Approved/Denied:   PASRR Number: [id number](Eff, 07/16/12)  Discharge Plan: SNF    Current Diagnoses: Patient Active Problem List   Diagnosis Date Noted  . HCAP (healthcare-associated pneumonia) 07/19/2017  . Pneumonia 07/19/2017  . Tachycardia 06/15/2017  . Retroperitoneal bleed 5/18 06/15/2017  . Chronic diastolic CHF (congestive heart failure) ([place]) 06/15/2017  . Elevated troponin   . Closed intertrochanteric fracture of hip, right, initial encounter ([place]) 10 /18 06/07/2017  . Acute on chronic diastolic CHF (congestive heart failure) ([place]) 06/07/2017  . AF (paroxysmal atrial fibrillation) ([place]) 12/22/2016  . Pulmonary nodule less than 6 cm determined by computed tomography of lung 11/27/2016  . Normocytic anemia 11/27/2016  . Chronic kidney disease (CKD), stage IV (severe) ([place]) 08/20/2016  . Hyperlipidemia 08/20/2016  . Moderate tricuspid regurgitation 01/11/2016  . Mild aortic stenosis 01/11/2016  . Mild aortic regurgitation 01/11/2016  . Osteoporosis 01/11/2016  . Prediabetes 12/19/2015  . Essential hypertension, benign 09/25/2015  . History of prostate cancer 09/25/2015  . Idiopathic scoliosis, thoracic 09/25/2015  . History of DVT of lower extremity, left 09/25/2015    Orientation RESPIRATION BLADDER Height & Weight     Self,  Time, Situation, Place  O2(2 Liters oxygen) External catheter Weight: 144 lb 2.9 oz (65.4 kg) Height:  5\' 5"  (165.1 cm)  BEHAVIORAL SYMPTOMS/MOOD NEUROLOGICAL BOWEL NUTRITION STATUS      Continent Diet(Heart healthy)  AMBULATORY STATUS COMMUNICATION OF NEEDS Skin   Limited Assist Verbally Other (Comment)(Incision right leg)                       Personal Care Assistance Level of Assistance  Bathing, Feeding, Dressing Bathing Assistance: Maximum assistance(Min assist upper body - Max assist lower body) Feeding assistance: Independent Dressing Assistance: Maximum assistance(Min assist upper body; Max assist lower body)     Functional Limitations Info  Sight, Hearing, Speech Sight Info: Impaired Hearing Info: Adequate Speech Info: Impaired(slurred/dysarthria)    SPECIAL CARE FACTORS FREQUENCY  PT (By licensed PT), OT (By licensed OT)     PT Frequency: Evaluated 12/4 OT Frequency: Evaluated 12/4            Contractures Contractures Info: Not present    Additional Factors Info  Code Status, Allergies Code Status Info: Full Allergies Info: Iodinated diagnostic agents, Ace inhibitors, Shell-fish derived products; Penicillins           Current Medications (07/22/2017):  This is the current hospital active medication list Current Facility-Administered Medications  Medication Dose Route Frequency Provider Last Rate Last Dose  . acetaminophen (TYLENOL) tablet 650 mg  650 mg Oral Q6H PRN Rise Patience, MD       Or  . acetaminophen (TYLENOL) suppository 650 mg  650 mg Rectal Q6H PRN Rise Patience, MD      . aspirin EC tablet 81 mg  81 mg Oral BID  Rise Patience, MD   81 mg at 07/21/17 2256  . bisacodyl (DULCOLAX) EC tablet 5 mg  5 mg Oral Daily PRN Rise Patience, MD      . ceFEPIme (MAXIPIME) 500 mg in dextrose 5 % 50 mL IVPB  500 mg Intravenous Q24H Rise Patience, MD   500 mg at 07/21/17 1817  . cholecalciferol (VITAMIN D) tablet 5,000  Units  5,000 Units Oral Daily Rise Patience, MD   5,000 Units at 07/21/17 1100  . diltiazem (CARDIZEM CD) 24 hr capsule 360 mg  360 mg Oral Daily Rise Patience, MD   360 mg at 07/21/17 1100  . doxazosin (CARDURA) tablet 1 mg  1 mg Oral QPM Rise Patience, MD   1 mg at 07/21/17 1811  . ferric gluconate (NULECIT) 125 mg in sodium chloride 0.9 % 100 mL IVPB  125 mg Intravenous Daily Corliss Parish, MD   Stopped at 07/21/17 1816  . furosemide (LASIX) injection 40 mg  40 mg Intravenous Q12H Corliss Parish, MD   40 mg at 07/22/17 6295  . isosorbide mononitrate (IMDUR) 24 hr tablet 30 mg  30 mg Oral Daily Rise Patience, MD   30 mg at 07/21/17 1100  . levalbuterol (XOPENEX) nebulizer solution 0.63 mg  0.63 mg Nebulization Q6H PRN Rise Patience, MD      . lip balm (BLISTEX) ointment   Topical PRN Bonnielee Haff, MD      . Melatonin TABS 9 mg  9 mg Oral QHS Rise Patience, MD   9 mg at 07/21/17 2256  . metoprolol tartrate (LOPRESSOR) injection 5 mg  5 mg Intravenous Once PRN Blount, Scarlette Shorts T, NP      . metoprolol tartrate (LOPRESSOR) tablet 12.5 mg  12.5 mg Oral BID Rise Patience, MD   12.5 mg at 07/21/17 2256  . omega-3 acid ethyl esters (LOVAZA) capsule 2 g  2 g Oral Daily Rise Patience, MD   2 g at 07/21/17 1100  . ondansetron (ZOFRAN) tablet 4 mg  4 mg Oral Q6H PRN Rise Patience, MD       Or  . ondansetron [facility]) injection 4 mg  4 mg Intravenous Q6H PRN Rise Patience, MD      . polyethylene glycol (MIRALAX / GLYCOLAX) packet 34 g  34 g Oral Daily Rise Patience, MD   34 g at 07/21/17 1100  . senna (SENOKOT) tablet 8.6 mg  1 tablet Oral q morning - 10a Rise Patience, MD   8.6 mg at 07/20/17 0813  . traMADol (ULTRAM) tablet 50 mg  50 mg Oral Q6H PRN Rise Patience, MD         Discharge Medications: Please see discharge summary for a list of discharge medications.  Relevant Imaging Results:  Relevant  Lab Results:   Additional Information ss#[id number]  Sable Feil, LCSW

## 2017-07-22 NOTE — Progress Notes (Addendum)
PROGRESS NOTE    Johnny Morrow  FTD:[id number] DOB: August 25, 1919 DOA: 07/19/2017 PCP: Wenda Low, MD     Brief Narrative:  Johnny Morrow 83 year old African-American male with a past medical history of chronic diastolic CHF paroxysmal atrial fibrillation not on anticoagulation due to severe retroperitoneal bleed in the recent past, history of hypertension, history of DVT in the past, history of atrial flutter, chronic kidney disease stage IV followed at [facility] who lives in a nursing facility and presented with worsening shortness of breath. Concern was for healthcare associated pneumonia. Patient was hospitalized for further management. There was also concern for volume overload. Due to his chronic kidney disease nephrology was consulted to assist with management.  Assessment & Plan:   Principal Problem:   HCAP (healthcare-associated pneumonia) Active Problems:   Essential hypertension, benign   Chronic kidney disease (CKD), stage IV (severe) (HCC)   AF (paroxysmal atrial fibrillation) (HCC)   Chronic diastolic CHF (congestive heart failure) (HCC)   Pneumonia   Acute respiratory failure with hypoxia Etiology likely multifactorial including healthcare associated pneumonia and volume overload due to chronic kidney disease.  Patient also has a history of diastolic CHF.  He has gained significant amount of weight over the last 2 months.  Currently getting intravenous Lasix.  Monitor ins and outs.  Daily weights.  Continue oxygen.  VQ scan did not suggest PE.  Healthcare associated pneumonia Patient remains on vancomycin and cefepime.  Unfortunately no blood cultures were sent at the time of admission.  Discontinue vancomycin and continue just cefepime for now.  Influenza PCR negative.  Strep pneumonia urinary antigen negative.   Chronic kidney disease stage IV Followed by nephrology at [facility].  Patient has been on diuretics at home.  However he has gained significant amount  of weight loss few weeks.  Nephrology consulted.  Appreciate their input.  Patient is on IV Lasix, dose increased today due to lack of improvement   Acute on chronic diastolic CHF Fluid overload likely due to combination of CKD as well as CHF.  BNP 2324 on admission. As discussed above.  Echocardiogram from 2017 showed normal systolic function.  Strict ins and outs.  Daily weights.  History of paroxysmal atrial fibrillation Monitor on telemetry.  Continue metoprolol and Cardizem.  Patient not on anticoagulation due to history of severe retroperitoneal bleed.  Anemia of chronic kidney disease Follow CBC closely.  No evidence of overt bleeding.  History of prostate cancer in remission Stable.  Thrombocytopenia  Platelet count was noted to be low.  This appears to be new compared to previous hospitalization.  Etiology is unclear, ?infection.  Counts are stable.  No evidence of bleeding.   DVT prophylaxis: SCD Code Status: Full Family Communication: Wife at bedside, long discussion with daughter over the phone today  Disposition Plan: Refused SNF. Home with [facility] on discharge    Consultants:   Nephrology  Procedures:   None  Antimicrobials:  Anti-infectives (From admission, onward)   Start     Dose/Rate Route Frequency Ordered Stop   07/21/17 1800  vancomycin (VANCOCIN) IVPB 750 mg/150 ml premix  Status:  Discontinued     750 mg 150 mL/hr over 60 Minutes Intravenous Every 48 hours 07/19/17 1746 07/21/17 1433   07/20/17 1800  ceFEPIme (MAXIPIME) 500 mg in dextrose 5 % 50 mL IVPB     500 mg 100 mL/hr over 30 Minutes Intravenous Every 24 hours 07/19/17 1946 07/27/17 1759   07/19/17 1700  vancomycin (VANCOCIN) 1,250  mg in sodium chloride 0.9 % 250 mL IVPB     1,250 mg 166.7 mL/hr over 90 Minutes Intravenous  Once 07/19/17 1614 07/19/17 1919   07/19/17 1630  ceFEPIme (MAXIPIME) 1 g in dextrose 5 % 50 mL IVPB     1 g 100 mL/hr over 30 Minutes Intravenous  Once 07/19/17  1604 07/19/17 1740       Subjective: No new complaints, states he is feeling better. Denies chest pain, still has some SOB, current on O2. Eating breakfast.   Objective: Vitals:   07/21/17 1811 07/21/17 2143 07/22/17 0526 07/22/17 0921  BP: 121/65 (!) 151/75 (!) 127/98 132/86  Pulse:  95 86 88  Resp:  12 14 16   Temp:  98 F (36.7 C) 97.7 F (36.5 C) 97.9 F (36.6 C)  TempSrc:    Oral  SpO2:  95% 98% 99%  Weight:  65.4 kg (144 lb 2.9 oz)    Height:        Intake/Output Summary (Last 24 hours) at 07/22/2017 1451 Last data filed at 07/22/2017 1300 Gross per 24 hour  Intake 960 ml  Output 1100 ml  Net -140 ml   Filed Weights   07/20/17 2108 07/21/17 0817 07/21/17 2143  Weight: 65 kg (143 lb 4.8 oz) 65 kg (143 lb 4.8 oz) 65.4 kg (144 lb 2.9 oz)    Examination:  General exam: Appears calm and comfortable  Respiratory system: +Crackle left base  Respiratory effort normal. Cardiovascular system: S1 & S2 heard, tachycardic regular rhythm. No JVD, murmurs, rubs, gallops or clicks. +1 pedal edema. Gastrointestinal system: Abdomen is nondistended, soft and nontender. No organomegaly or masses felt. Normal bowel sounds heard. Central nervous system: Alert and oriented. No focal neurological deficits. Extremities: Symmetric 5 x 5 power. Skin: No rashes, lesions or ulcers Psychiatry: Judgement and insight appear normal. Mood & affect appropriate.   Data Reviewed: I have personally reviewed following labs and imaging studies  CBC: Recent Labs  Lab 07/19/17 1546 07/19/17 2101 07/20/17 0148 07/21/17 0512 07/22/17 0951  WBC 5.9 4.7 3.7* 3.9* 4.2  NEUTROABS 3.3  --   --   --   --   HGB 9.1* 8.5* 8.4* 8.9* 9.6*  HCT 27.2* 25.6* 24.9* 26.5* 29.2*  MCV 84.5 83.4 83.3 83.3 83.9  PLT 85* 83* 75* 94* 88*   Basic Metabolic Panel: Recent Labs  Lab 07/19/17 1546 07/19/17 2101 07/20/17 0148 07/21/17 0512 07/22/17 0951  NA 139  --  139 137 139  K 4.2  --  4.2 4.0 3.8  CL 108   --  110 102 104  CO2 22  --  22 23 22   GLUCOSE 166*  --  125* 121* 101*  BUN 89*  --  86* 86* 88*  CREATININE 4.30* 4.40* 4.22* 4.33* 4.40*  CALCIUM 8.6*  --  8.4* 8.5* 8.8*   GFR: Estimated Creatinine Clearance: 8.7 mL/min (A) (by C-G formula based on SCr of 4.4 mg/dL (H)). Liver Function Tests: No results for input(s): AST, ALT, ALKPHOS, BILITOT, PROT, ALBUMIN in the last 168 hours. No results for input(s): LIPASE, AMYLASE in the last 168 hours. No results for input(s): AMMONIA in the last 168 hours. Coagulation Profile: No results for input(s): INR, PROTIME in the last 168 hours. Cardiac Enzymes: Recent Labs  Lab 07/19/17 1546 07/19/17 2101 07/20/17 0148 07/20/17 0638  TROPONINI 0.04* 0.05* 0.05* 0.05*   BNP (last 3 results) Recent Labs    08/20/16 1221 12/22/16 1453  PROBNP 373.0* 999.0*  HbA1C: No results for input(s): HGBA1C in the last 72 hours. CBG: No results for input(s): GLUCAP in the last 168 hours. Lipid Profile: No results for input(s): CHOL, HDL, LDLCALC, TRIG, CHOLHDL, LDLDIRECT in the last 72 hours. Thyroid Function Tests: No results for input(s): TSH, T4TOTAL, FREET4, T3FREE, THYROIDAB in the last 72 hours. Anemia Panel: Recent Labs    07/21/17 0512  FERRITIN 241  TIBC 253  IRON 30*   Sepsis Labs: No results for input(s): PROCALCITON, LATICACIDVEN in the last 168 hours.  Recent Results (from the past 240 hour(s))  MRSA PCR Screening     Status: None   Collection Time: 07/19/17 11:03 PM  Result Value Ref Range Status   MRSA by PCR NEGATIVE NEGATIVE Final    Comment:        The GeneXpert MRSA Assay (FDA approved for NASAL specimens only), is one component of a comprehensive MRSA colonization surveillance program. It is not intended to diagnose MRSA infection nor to guide or monitor treatment for MRSA infections.        Radiology Studies: No results found.    Scheduled Meds: . aspirin EC  81 mg Oral BID  . ceFEPime  (MAXIPIME) IV  500 mg Intravenous Q24H  . cholecalciferol  5,000 Units Oral Daily  . diltiazem  360 mg Oral Daily  . doxazosin  1 mg Oral QPM  . furosemide  80 mg Intravenous Q12H  . isosorbide mononitrate  30 mg Oral Daily  . Melatonin  9 mg Oral QHS  . metoprolol tartrate  12.5 mg Oral BID  . omega-3 acid ethyl esters  2 g Oral Daily  . polyethylene glycol  34 g Oral Daily  . senna  1 tablet Oral q morning - 10a   Continuous Infusions: . ferric gluconate (FERRLECIT/NULECIT) IV Stopped (07/22/17 1151)     LOS: 3 days    Time spent: 40 minutes   Dessa Phi, DO [facility] [email or web address] Password [email or web address] 07/22/2017, 2:51 PM

## 2017-07-22 NOTE — Progress Notes (Signed)
Subjective:  1100 of UOP yest- overall neg 1600 but says his breathing  feels much better- crt basically stable but up a little- weight unchanged- wife at bedside today- understands he is not candidate for dialysis Objective Vital signs in last 24 hours: Vitals:   07/21/17 1811 07/21/17 2143 07/22/17 0526 07/22/17 0921  BP: 121/65 (!) 151/75 (!) 127/98 132/86  Pulse:  95 86 88  Resp:  12 14 16   Temp:  98 F (36.7 C) 97.7 F (36.5 C) 97.9 F (36.6 C)  TempSrc:    Oral  SpO2:  95% 98% 99%  Weight:  65.4 kg (144 lb 2.9 oz)    Height:       Weight change: 0 kg (0 oz)  Intake/Output Summary (Last 24 hours) at 07/22/2017 1053 Last data filed at 07/22/2017 2094 Gross per 24 hour  Intake 480 ml  Output 1100 ml  Net -620 ml    Assessment/Plan: 83 year old black male with advanced CKD at baseline.  He now presents with worsening shortness of breath with chest x-ray and exam consistent with volume overload 1.Renal-advanced CKD but has been present long-term.  Current GFR is 12.  Difficult to determine if he is having other uremic symptoms -  I agree and would even go as far to say that I would not start him on dialysis as I cannot anticipate a good outcome for an 83 year old on dialysis 2. Hypertension/volume  -volume overloaded.  However, urine output was reasonable Lasix at 40 mg IV every 12 hours- but not having impact - will inc to 80 BID.  He is also on low-dose Lopressor probably more so for his A. fib than his blood pressure 3.  Pneumonia-I suspect these chest x-ray findings are more due to volume than pneumonia.  Continue Maxipime and vancomycin for now 4. Anemia  -significant anemia-he claims that this is not new.  He tells me that he was not on an ESA as an outpatient.  iron stores low, repleting  -  also  gave 100 darbe on 12/3- is stable to improving     Keisi Eckford A    Labs: Basic Metabolic Panel: Recent Labs  Lab 07/20/17 0148 07/21/17 0512 07/22/17 0951  NA  139 137 139  K 4.2 4.0 3.8  CL 110 102 104  CO2 22 23 22   GLUCOSE 125* 121* 101*  BUN 86* 86* 88*  CREATININE 4.22* 4.33* 4.40*  CALCIUM 8.4* 8.5* 8.8*   Liver Function Tests: No results for input(s): AST, ALT, ALKPHOS, BILITOT, PROT, ALBUMIN in the last 168 hours. No results for input(s): LIPASE, AMYLASE in the last 168 hours. No results for input(s): AMMONIA in the last 168 hours. CBC: Recent Labs  Lab 07/19/17 1546 07/19/17 2101 07/20/17 0148 07/21/17 0512 07/22/17 0951  WBC 5.9 4.7 3.7* 3.9* 4.2  NEUTROABS 3.3  --   --   --   --   HGB 9.1* 8.5* 8.4* 8.9* 9.6*  HCT 27.2* 25.6* 24.9* 26.5* 29.2*  MCV 84.5 83.4 83.3 83.3 83.9  PLT 85* 83* 75* 94* 88*   Cardiac Enzymes: Recent Labs  Lab 07/19/17 1546 07/19/17 2101 07/20/17 0148 07/20/17 0638  TROPONINI 0.04* 0.05* 0.05* 0.05*   CBG: No results for input(s): GLUCAP in the last 168 hours.  Iron Studies:  Recent Labs    07/21/17 0512  IRON 30*  TIBC 253  FERRITIN 241   Studies/Results: Nm Pulmonary Perf And Vent  Result Date: 07/20/2017 CLINICAL DATA:  Positive D-dimer EXAM: NUCLEAR  MEDICINE VENTILATION - PERFUSION LUNG SCAN TECHNIQUE: Ventilation images were obtained in multiple projections using inhaled aerosol Tc-38m DTPA. Perfusion images were obtained in multiple projections after intravenous injection of Tc-79m MAA. RADIOPHARMACEUTICALS:  32 mCi Technetium-98m DTPA aerosol inhalation and 4.2 mCi Technetium-48m MAA IV COMPARISON:  05/16/2017 and chest x-ray from previous day FINDINGS: Ventilation: The overall ventilation is decrease when compared with the prior exam although no large defect is seen. Some central trapping is noted which can be seen with COPD. Perfusion: Perfusion imaging demonstrates some slight decrease activity in the bases bilaterally related to infiltrative changes seen on the recent chest x-ray. No definitive undulation perfusion mismatch is noted to suggest pulmonary embolism. IMPRESSION:  Slight decreased ventilation and perfusion in the bases bilaterally related to recent infiltrates on chest x-ray. No definitive ventilation-perfusion mismatch is noted to suggest pulmonary embolism. Electronically Signed   By: Inez Catalina M.D.   On: 07/20/2017 14:58   Medications: Infusions: . ferric gluconate (FERRLECIT/NULECIT) IV 125 mg (07/22/17 1051)    Scheduled Medications: . aspirin EC  81 mg Oral BID  . ceFEPime (MAXIPIME) IV  500 mg Intravenous Q24H  . cholecalciferol  5,000 Units Oral Daily  . diltiazem  360 mg Oral Daily  . doxazosin  1 mg Oral QPM  . furosemide  40 mg Intravenous Q12H  . isosorbide mononitrate  30 mg Oral Daily  . Melatonin  9 mg Oral QHS  . metoprolol tartrate  12.5 mg Oral BID  . omega-3 acid ethyl esters  2 g Oral Daily  . polyethylene glycol  34 g Oral Daily  . senna  1 tablet Oral q morning - 10a    have reviewed scheduled and prn medications.  Physical Exam: General: sitting up in chair Heart: RRR Lungs: CBS bilat Abdomen: soft, non tender Extremities: pitting edema    07/22/2017,10:53 AM  LOS: 3 days

## 2017-07-23 ENCOUNTER — Ambulatory Visit: Payer: Medicare Other | Admitting: Internal Medicine

## 2017-07-23 LAB — RENAL FUNCTION PANEL
ALBUMIN: 2.7 g/dL — AB (ref 3.5–5.0)
ANION GAP: 12 (ref 5–15)
BUN: 92 mg/dL — ABNORMAL HIGH (ref 6–20)
CHLORIDE: 99 mmol/L — AB (ref 101–111)
CO2: 26 mmol/L (ref 22–32)
Calcium: 8.7 mg/dL — ABNORMAL LOW (ref 8.9–10.3)
Creatinine, Ser: 4.3 mg/dL — ABNORMAL HIGH (ref 0.61–1.24)
GFR calc Af Amer: 12 mL/min — ABNORMAL LOW (ref >60)
GFR calc non Af Amer: 11 mL/min — ABNORMAL LOW (ref >60)
GLUCOSE: 141 mg/dL — AB (ref 65–99)
PHOSPHORUS: 4.2 mg/dL (ref 2.5–4.6)
POTASSIUM: 3.4 mmol/L — AB (ref 3.5–5.1)
Sodium: 137 mmol/L (ref 135–145)

## 2017-07-23 LAB — CBC
HCT: 26.8 % — ABNORMAL LOW (ref 39.0–52.0)
HEMOGLOBIN: 8.9 g/dL — AB (ref 13.0–17.0)
MCH: 27.7 pg (ref 26.0–34.0)
MCHC: 33.2 g/dL (ref 30.0–36.0)
MCV: 83.5 fL (ref 78.0–100.0)
Platelets: 82 10*3/uL — ABNORMAL LOW (ref 150–400)
RBC: 3.21 MIL/uL — ABNORMAL LOW (ref 4.22–5.81)
RDW: 15 % (ref 11.5–15.5)
WBC: 4 10*3/uL (ref 4.0–10.5)

## 2017-07-23 LAB — HIV 1/2 AB DIFFERENTIATION
HIV 1 AB: NEGATIVE
HIV 2 Ab: NEGATIVE
NOTE (HIV CONF MULTISPOT): NEGATIVE

## 2017-07-23 LAB — RNA QUALITATIVE

## 2017-07-23 LAB — HIV ANTIBODY (ROUTINE TESTING W REFLEX): HIV Screen 4th Generation wRfx: REACTIVE — AB

## 2017-07-23 MED ORDER — CEFUROXIME AXETIL 500 MG PO TABS
500.0000 mg | ORAL_TABLET | ORAL | Status: DC
  Administered 2017-07-23: 500 mg via ORAL
  Filled 2017-07-23 (×2): qty 1

## 2017-07-23 NOTE — Consult Note (Signed)
   [facility] CM Inpatient Consult   07/23/2017  Aaryn Sermon 12/02/1919 [id number]  Patient assessed for multiple hospitalizations in the past 6 months.  Patient was referred to [place] [[facility]] program and has agreed. Came by to speak with the patient and he agrees for the additional follow up.  He endorses that he lives at [facility].  Explained [facility] Care Management support and follow up if needed. Patient states that [name] has transportation. Consent form signed.  Spoke with inpatient RNCM that patient has accepted the [facility] for post hospital transition.  For questions, please contact:  Natividad Brood, RN BSN [facility] Liaison  [phone] business mobile phone Toll free office [phone]

## 2017-07-23 NOTE — Progress Notes (Signed)
PROGRESS NOTE    Johnny Morrow  UDJ:[id number] DOB: 02-03-1920 DOA: 07/19/2017 PCP: Wenda Low, MD      Brief Narrative:  Mitsuru Dault 83 year old African-American male with a past medical history of chronic diastolic CHF paroxysmal atrial fibrillation not on anticoagulation due to severe retroperitoneal bleed in the recent past, history of hypertension, history of DVT in the past, history of atrial flutter, chronic kidney disease stage IV followed at [facility] who lives in a nursing facility and presented with worsening shortness of breath. Concern was for healthcare associated pneumonia. Patient was hospitalized for further management. There was also concern for volume overload. Due to his chronic kidney disease nephrology was consulted to assist with management.  Assessment & Plan:   Principal Problem:   HCAP (healthcare-associated pneumonia) Active Problems:   Essential hypertension, benign   Chronic kidney disease (CKD), stage IV (severe) (HCC)   AF (paroxysmal atrial fibrillation) (HCC)   Chronic diastolic CHF (congestive heart failure) (HCC)   Pneumonia   Acute respiratory failure with hypoxia Etiology likely multifactorial including healthcare associated pneumonia and volume overload due to chronic kidney disease.  Patient also has a history of diastolic CHF.  He has gained significant amount of weight over the last 2 months.  Currently getting intravenous Lasix.  Monitor ins and outs.  Daily weights.  Continue oxygen.  VQ scan did not suggest PE.  Healthcare associated pneumonia Treated with vanco/cefepime initially. Unfortunately no blood cultures were sent at the time of admission. Influenza PCR negative.  Strep pneumonia urinary antigen negative. Deescalate to oral ceftin today   Chronic kidney disease stage IV Followed by nephrology at [facility].  Patient has been on diuretics at home.  However he has gained significant amount of weight loss few weeks.   Nephrology consulted.  Appreciate their input.  Patient is on IV Lasix, continue IV for now. Hopefully can switch to PO   Acute on chronic diastolic CHF Fluid overload likely due to combination of CKD as well as CHF.  BNP 2324 on admission. As discussed above.  Echocardiogram from 2017 showed normal systolic function.  Strict ins and outs.  Daily weights.   History of paroxysmal atrial fibrillation Monitor on telemetry.  Continue metoprolol and Cardizem.  Patient not on anticoagulation due to history of severe retroperitoneal bleed.   Anemia of chronic kidney disease Follow CBC closely.  No evidence of overt bleeding.  History of prostate cancer in remission Stable.  Thrombocytopenia  Platelet count was noted to be low.  This appears to be new compared to previous hospitalization.  Etiology is unclear, ?infection.  Counts are stable.  No evidence of bleeding.   DVT prophylaxis: SCD Code Status: Full Family Communication: No family at bedside, left voicemail with daughter  Disposition Plan: Refused SNF. Home with [facility] on discharge    Consultants:   Nephrology  Procedures:   None  Antimicrobials:  Anti-infectives (From admission, onward)   Start     Dose/Rate Route Frequency Ordered Stop   07/23/17 1800  cefUROXime (CEFTIN) tablet 500 mg     500 mg Oral Every 48 hours 07/23/17 1213 07/27/17 1759   07/21/17 1800  vancomycin (VANCOCIN) IVPB 750 mg/150 ml premix  Status:  Discontinued     750 mg 150 mL/hr over 60 Minutes Intravenous Every 48 hours 07/19/17 1746 07/21/17 1433   07/20/17 1800  ceFEPIme (MAXIPIME) 500 mg in dextrose 5 % 50 mL IVPB  Status:  Discontinued     500 mg  100 mL/hr over 30 Minutes Intravenous Every 24 hours 07/19/17 1946 07/23/17 1213   07/19/17 1700  vancomycin (VANCOCIN) 1,250 mg in sodium chloride 0.9 % 250 mL IVPB     1,250 mg 166.7 mL/hr over 90 Minutes Intravenous  Once 07/19/17 1614 07/19/17 1919   07/19/17 1630  ceFEPIme (MAXIPIME)  1 g in dextrose 5 % 50 mL IVPB     1 g 100 mL/hr over 30 Minutes Intravenous  Once 07/19/17 1604 07/19/17 1740       Subjective: No new complaints, states he is feeling better. Diuresed well on increased lasix dose   Objective: Vitals:   07/22/17 1831 07/22/17 2128 07/23/17 0340 07/23/17 1017  BP: (!) 114/59 (!) 150/76 134/67 (!) 154/88  Pulse: (!) 50 (!) 59 (!) 59 99  Resp: 18 18 18 18   Temp: 98 F (36.7 C) 97.6 F (36.4 C) 97.7 F (36.5 C) 97.7 F (36.5 C)  TempSrc: Oral Oral Oral Oral  SpO2: 97% 98% 99% 100%  Weight:  61.7 kg (136 lb 0.4 oz)    Height:        Intake/Output Summary (Last 24 hours) at 07/23/2017 1247 Last data filed at 07/23/2017 1010 Gross per 24 hour  Intake 1250 ml  Output 2800 ml  Net -1550 ml   Filed Weights   07/21/17 0817 07/21/17 2143 07/22/17 2128  Weight: 65 kg (143 lb 4.8 oz) 65.4 kg (144 lb 2.9 oz) 61.7 kg (136 lb 0.4 oz)    Examination:  General exam: Appears calm and comfortable  Respiratory system: +Crackle bilateral bases,  Respiratory effort normal. Cardiovascular system: S1 & S2 heard. No JVD, murmurs, rubs, gallops or clicks. +1 pedal edema R>L  Gastrointestinal system: Abdomen is nondistended, soft and nontender. No organomegaly or masses felt. Normal bowel sounds heard. Central nervous system: Alert and oriented. No focal neurological deficits. Extremities: Symmetric 5 x 5 power. Skin: No rashes, lesions or ulcers Psychiatry: Judgement and insight appear normal. Mood & affect appropriate.   Data Reviewed: I have personally reviewed following labs and imaging studies  CBC: Recent Labs  Lab 07/19/17 1546 07/19/17 2101 07/20/17 0148 07/21/17 0512 07/22/17 0951 07/23/17 0547  WBC 5.9 4.7 3.7* 3.9* 4.2 4.0  NEUTROABS 3.3  --   --   --   --   --   HGB 9.1* 8.5* 8.4* 8.9* 9.6* 8.9*  HCT 27.2* 25.6* 24.9* 26.5* 29.2* 26.8*  MCV 84.5 83.4 83.3 83.3 83.9 83.5  PLT 85* 83* 75* 94* 88* 82*   Basic Metabolic Panel: Recent  Labs  Lab 07/19/17 1546 07/19/17 2101 07/20/17 0148 07/21/17 0512 07/22/17 0951 07/23/17 0547  NA 139  --  139 137 139 137  K 4.2  --  4.2 4.0 3.8 3.4*  CL 108  --  110 102 104 99*  CO2 22  --  22 23 22 26   GLUCOSE 166*  --  125* 121* 101* 141*  BUN 89*  --  86* 86* 88* 92*  CREATININE 4.30* 4.40* 4.22* 4.33* 4.40* 4.30*  CALCIUM 8.6*  --  8.4* 8.5* 8.8* 8.7*  PHOS  --   --   --   --   --  4.2   GFR: Estimated Creatinine Clearance: 8.9 mL/min (A) (by C-G formula based on SCr of 4.3 mg/dL (H)). Liver Function Tests: Recent Labs  Lab 07/23/17 0547  ALBUMIN 2.7*   No results for input(s): LIPASE, AMYLASE in the last 168 hours. No results for input(s): AMMONIA in the  last 168 hours. Coagulation Profile: No results for input(s): INR, PROTIME in the last 168 hours. Cardiac Enzymes: Recent Labs  Lab 07/19/17 1546 07/19/17 2101 07/20/17 0148 07/20/17 0638  TROPONINI 0.04* 0.05* 0.05* 0.05*   BNP (last 3 results) Recent Labs    08/20/16 1221 12/22/16 1453  PROBNP 373.0* 999.0*   HbA1C: No results for input(s): HGBA1C in the last 72 hours. CBG: No results for input(s): GLUCAP in the last 168 hours. Lipid Profile: No results for input(s): CHOL, HDL, LDLCALC, TRIG, CHOLHDL, LDLDIRECT in the last 72 hours. Thyroid Function Tests: No results for input(s): TSH, T4TOTAL, FREET4, T3FREE, THYROIDAB in the last 72 hours. Anemia Panel: Recent Labs    07/21/17 0512  FERRITIN 241  TIBC 253  IRON 30*   Sepsis Labs: No results for input(s): PROCALCITON, LATICACIDVEN in the last 168 hours.  Recent Results (from the past 240 hour(s))  MRSA PCR Screening     Status: None   Collection Time: 07/19/17 11:03 PM  Result Value Ref Range Status   MRSA by PCR NEGATIVE NEGATIVE Final    Comment:        The GeneXpert MRSA Assay (FDA approved for NASAL specimens only), is one component of a comprehensive MRSA colonization surveillance program. It is not intended to diagnose  MRSA infection nor to guide or monitor treatment for MRSA infections.        Radiology Studies: No results found.    Scheduled Meds: . aspirin EC  81 mg Oral BID  . cefUROXime  500 mg Oral Q48H  . cholecalciferol  5,000 Units Oral Daily  . diltiazem  360 mg Oral Daily  . doxazosin  1 mg Oral QPM  . furosemide  80 mg Intravenous Q12H  . isosorbide mononitrate  30 mg Oral Daily  . Melatonin  9 mg Oral QHS  . metoprolol tartrate  12.5 mg Oral BID  . omega-3 acid ethyl esters  2 g Oral Daily  . polyethylene glycol  34 g Oral Daily  . senna  1 tablet Oral q morning - 10a   Continuous Infusions: . ferric gluconate (FERRLECIT/NULECIT) IV Stopped (07/23/17 1258)     LOS: 4 days    Time spent: 30 minutes   Dessa Phi, DO [facility] [email or web address] Password [facility] 07/23/2017, 12:47 PM

## 2017-07-23 NOTE — Progress Notes (Signed)
Subjective:  2000 of UOP yest-  says his breathing  feels much better- crt basically stable  -weight down- wife at bedside today-  Objective Vital signs in last 24 hours: Vitals:   07/22/17 1831 07/22/17 2128 07/23/17 0340 07/23/17 1017  BP: (!) 114/59 (!) 150/76 134/67 (!) 154/88  Pulse: (!) 50 (!) 59 (!) 59 99  Resp: 18 18 18 18   Temp: 98 F (36.7 C) 97.6 F (36.4 C) 97.7 F (36.5 C) 97.7 F (36.5 C)  TempSrc: Oral Oral Oral Oral  SpO2: 97% 98% 99% 100%  Weight:  61.7 kg (136 lb 0.4 oz)    Height:       Weight change: -3.3 kg (-4.4 oz)  Intake/Output Summary (Last 24 hours) at 07/23/2017 1046 Last data filed at 07/23/2017 1010 Gross per 24 hour  Intake 1350 ml  Output 2800 ml  Net -1450 ml    Assessment/Plan: 83 year old black male with advanced CKD at baseline.  He now presents with worsening shortness of breath with chest x-ray and exam consistent with volume overload 1.Renal-advanced CKD but has been present long-term.  Current GFR is 12.  Difficult to determine if he is having other uremic symptoms -  I agree and would even go as far to say that I would not start him on dialysis as I cannot anticipate a good outcome for an 83 year old on dialysis- wife and pt aware 2. Hypertension/volume  -volume overloaded.  However, urine output was reasonable on  Lasix at 40 mg IV every 12 hours-  inc to 80 BID- better.  He is also on low-dose Lopressor probably more so for his A. fib than his blood pressure.  I will keep IV lasix today then change to PO tomorrow- if maintains on PO dosing then OK to plan for discharge 3.  Pneumonia-I suspect these chest x-ray findings are more due to volume than pneumonia.  Continue Maxipime - he may need O2 for home as we will not be able to get him completely dry 4. Anemia  -significant anemia-he claims that this is not new.  He tells me that he was not on an ESA as an outpatient.  iron stores low, repleting  -  also  gave 100 darbe on 12/3- is stable to  improving     Aasir Daigler A    Labs: Basic Metabolic Panel: Recent Labs  Lab 07/21/17 0512 07/22/17 0951 07/23/17 0547  NA 137 139 137  K 4.0 3.8 3.4*  CL 102 104 99*  CO2 23 22 26   GLUCOSE 121* 101* 141*  BUN 86* 88* 92*  CREATININE 4.33* 4.40* 4.30*  CALCIUM 8.5* 8.8* 8.7*  PHOS  --   --  4.2   Liver Function Tests: Recent Labs  Lab 07/23/17 0547  ALBUMIN 2.7*   No results for input(s): LIPASE, AMYLASE in the last 168 hours. No results for input(s): AMMONIA in the last 168 hours. CBC: Recent Labs  Lab 07/19/17 1546 07/19/17 2101 07/20/17 0148 07/21/17 0512 07/22/17 0951 07/23/17 0547  WBC 5.9 4.7 3.7* 3.9* 4.2 4.0  NEUTROABS 3.3  --   --   --   --   --   HGB 9.1* 8.5* 8.4* 8.9* 9.6* 8.9*  HCT 27.2* 25.6* 24.9* 26.5* 29.2* 26.8*  MCV 84.5 83.4 83.3 83.3 83.9 83.5  PLT 85* 83* 75* 94* 88* 82*   Cardiac Enzymes: Recent Labs  Lab 07/19/17 1546 07/19/17 2101 07/20/17 0148 07/20/17 0638  TROPONINI 0.04* 0.05* 0.05* 0.05*   CBG:  No results for input(s): GLUCAP in the last 168 hours.  Iron Studies:  Recent Labs    07/21/17 0512  IRON 30*  TIBC 253  FERRITIN 241   Studies/Results: No results found. Medications: Infusions: . ferric gluconate (FERRLECIT/NULECIT) IV Stopped (07/22/17 1151)    Scheduled Medications: . aspirin EC  81 mg Oral BID  . ceFEPime (MAXIPIME) IV  500 mg Intravenous Q24H  . cholecalciferol  5,000 Units Oral Daily  . diltiazem  360 mg Oral Daily  . doxazosin  1 mg Oral QPM  . furosemide  80 mg Intravenous Q12H  . isosorbide mononitrate  30 mg Oral Daily  . Melatonin  9 mg Oral QHS  . metoprolol tartrate  12.5 mg Oral BID  . omega-3 acid ethyl esters  2 g Oral Daily  . polyethylene glycol  34 g Oral Daily  . senna  1 tablet Oral q morning - 10a    have reviewed scheduled and prn medications.  Physical Exam: General: sitting up in chair Heart: RRR Lungs: CBS bilat Abdomen: soft, non tender Extremities:  pitting edema    07/23/2017,10:46 AM  LOS: 4 days

## 2017-07-24 ENCOUNTER — Telehealth: Payer: Self-pay | Admitting: Internal Medicine

## 2017-07-24 LAB — CBC
HEMATOCRIT: 27.2 % — AB (ref 39.0–52.0)
Hemoglobin: 9.1 g/dL — ABNORMAL LOW (ref 13.0–17.0)
MCH: 28.3 pg (ref 26.0–34.0)
MCHC: 33.5 g/dL (ref 30.0–36.0)
MCV: 84.5 fL (ref 78.0–100.0)
Platelets: 91 10*3/uL — ABNORMAL LOW (ref 150–400)
RBC: 3.22 MIL/uL — AB (ref 4.22–5.81)
RDW: 15.2 % (ref 11.5–15.5)
WBC: 4.3 10*3/uL (ref 4.0–10.5)

## 2017-07-24 LAB — RENAL FUNCTION PANEL
Albumin: 2.8 g/dL — ABNORMAL LOW (ref 3.5–5.0)
Anion gap: 12 (ref 5–15)
BUN: 93 mg/dL — ABNORMAL HIGH (ref 6–20)
CHLORIDE: 99 mmol/L — AB (ref 101–111)
CO2: 26 mmol/L (ref 22–32)
Calcium: 8.7 mg/dL — ABNORMAL LOW (ref 8.9–10.3)
Creatinine, Ser: 4.54 mg/dL — ABNORMAL HIGH (ref 0.61–1.24)
GFR, EST AFRICAN AMERICAN: 11 mL/min — AB (ref >60)
GFR, EST NON AFRICAN AMERICAN: 10 mL/min — AB (ref >60)
Glucose, Bld: 111 mg/dL — ABNORMAL HIGH (ref 65–99)
POTASSIUM: 3.2 mmol/L — AB (ref 3.5–5.1)
Phosphorus: 3.8 mg/dL (ref 2.5–4.6)
Sodium: 137 mmol/L (ref 135–145)

## 2017-07-24 MED ORDER — FUROSEMIDE 80 MG PO TABS
80.0000 mg | ORAL_TABLET | Freq: Two times a day (BID) | ORAL | Status: DC
  Administered 2017-07-24 – 2017-07-25 (×2): 80 mg via ORAL
  Filled 2017-07-24 (×2): qty 1

## 2017-07-24 MED ORDER — POTASSIUM CHLORIDE CRYS ER 20 MEQ PO TBCR
40.0000 meq | EXTENDED_RELEASE_TABLET | ORAL | Status: AC
  Administered 2017-07-24 (×2): 40 meq via ORAL
  Filled 2017-07-24 (×2): qty 2

## 2017-07-24 NOTE — Progress Notes (Signed)
Physical Therapy Treatment Patient Details Name: Johnny Morrow MRN: [id number] DOB: Apr 19, 1920 Today's Date: 07/24/2017    History of Present Illness Pt is a 83 y.o. male admitted with worsening SOB.  Dx HCAP.  PMH includes:    paroxysmal A-Fib, chronic diastolic CHF, h/o DVT, h/o A-flutter, CKD stage IV    PT Comments    Pt presented supine in bed with HOB elevated, awake and willing to participate in therapy session. Pt making slow progress with mobility and continues to be limited secondary to fatigue. Pt only agreeable to ambulate within room and sit up in recliner chair in preparation for breakfast. Pt would continue to benefit from skilled physical therapy services at this time while admitted and after d/c to address the below listed limitations in order to improve overall safety and independence with functional mobility.   Follow Up Recommendations  SNF;Other (comment)(if pt refuses, will need HHPT and 24/7 assistance)     Equipment Recommendations  None recommended by PT    Recommendations for Other Services       Precautions / Restrictions Precautions Precautions: Fall Restrictions Weight Bearing Restrictions: No    Mobility  Bed Mobility Overal bed mobility: Needs Assistance Bed Mobility: Supine to Sit     Supine to sit: Supervision     General bed mobility comments: supervision for safety  Transfers Overall transfer level: Needs assistance Equipment used: Rolling walker (2 wheeled) Transfers: Sit to/from Stand Sit to Stand: Min assist         General transfer comment: assist for stability with transitional movement  Ambulation/Gait Ambulation/Gait assistance: Min guard Ambulation Distance (Feet): 20 Feet Assistive device: Rolling walker (2 wheeled) Gait Pattern/deviations: Step-through pattern;Decreased step length - right;Decreased step length - left;Decreased stride length;Trunk flexed Gait velocity: decreased Gait velocity interpretation: <1.8  ft/sec, indicative of risk for recurrent falls General Gait Details: slow, cautious gait with use of RW; min guard for safety; no overt LOB or need for physical assistance.   Stairs            Wheelchair Mobility    Modified Rankin (Stroke Patients Only)       Balance Overall balance assessment: Needs assistance Sitting-balance support: Feet supported Sitting balance-Leahy Scale: Fair     Standing balance support: Bilateral upper extremity supported;During functional activity Standing balance-Leahy Scale: Poor Standing balance comment: reliant on bilateral UEs on RW                            Cognition Arousal/Alertness: Awake/alert Behavior During Therapy: WFL for tasks assessed/performed Overall Cognitive Status: Within Functional Limits for tasks assessed                                        Exercises      General Comments        Pertinent Vitals/Pain Pain Assessment: No/denies pain    Home Living                      Prior Function            PT Goals (current goals can now be found in the care plan section) Acute Rehab PT Goals PT Goal Formulation: With patient Time For Goal Achievement: 08/04/17 Potential to Achieve Goals: Good Progress towards PT goals: Progressing toward goals    Frequency    Min  2X/week      PT Plan Current plan remains appropriate    Co-evaluation              AM-PAC PT "6 Clicks" Daily Activity  Outcome Measure  Difficulty turning over in bed (including adjusting bedclothes, sheets and blankets)?: None Difficulty moving from lying on back to sitting on the side of the bed? : None Difficulty sitting down on and standing up from a chair with arms (e.g., wheelchair, bedside commode, etc,.)?: Unable Help needed moving to and from a bed to chair (including a wheelchair)?: A Little Help needed walking in hospital room?: A Little Help needed climbing 3-5 steps with a  railing? : A Lot 6 Click Score: 17    End of Session Equipment Utilized During Treatment: Gait belt;Oxygen Activity Tolerance: Patient tolerated treatment well Patient left: in chair;with call bell/phone within reach;with chair alarm set;with family/visitor present Nurse Communication: Mobility status PT Visit Diagnosis: Unsteadiness on feet (R26.81);Muscle weakness (generalized) (M62.81);Difficulty in walking, not elsewhere classified (R26.2);Adult, failure to thrive (R62.7)     Time: 5038-8828 PT Time Calculation (min) (ACUTE ONLY): 14 min  Charges:  $Gait Training: 8-22 mins                    G Codes:       [place]Greg[place], [place], [place] 07/24/2017, 9:12 AM

## 2017-07-24 NOTE — Progress Notes (Signed)
Occupational Therapy Treatment Patient Details Name: Johnny Morrow MRN: [id number] DOB: 1920/04/04 Today's Date: 07/24/2017    History of present illness Pt is a 83 y.o. male admitted with worsening SOB.  Dx HCAP.  PMH includes:    paroxysmal A-Fib, chronic diastolic CHF, h/o DVT, h/o A-flutter, CKD stage IV   OT comments  Pt progressing towards goals. Focus of session on bil UE HEP and sit<>stand at RW. Pt engaged in bil UE HEP with supervision and min verbal cues using red theraband and with rest breaks throughout. Pt completed sit<>stant at [facility] with MinA x3 during session. Completes UB dressing with MinA. Pt will benefit from continued acute OT services to maximize Pt's safety and independence with ADLs and functional mobility prior to discharge. Feel CIR recommendation remains appropriate at this time.     Follow Up Recommendations  Supervision/Assistance - 24 hour;CIR    Equipment Recommendations  None recommended by OT          Precautions / Restrictions Precautions Precautions: Fall Restrictions Weight Bearing Restrictions: No       Mobility Bed Mobility               General bed mobility comments: OOB in recliner upon arrival   Transfers Overall transfer level: Needs assistance Equipment used: Rolling walker (2 wheeled) Transfers: Sit to/from Stand Sit to Stand: Min assist         General transfer comment: assist for stability with transitional movement, cues for hand placement and controlled descent to sitting, Pt stood x3 from recliner to RW    Balance Overall balance assessment: Needs assistance Sitting-balance support: Feet supported Sitting balance-Leahy Scale: Fair     Standing balance support: Bilateral upper extremity supported Standing balance-Leahy Scale: Poor Standing balance comment: reliant on bilateral UEs on RW                           ADL either performed or assessed with clinical judgement   ADL Overall ADL's : Needs  assistance/impaired                 Upper Body Dressing : Minimal assistance;Sitting Upper Body Dressing Details (indicate cue type and reason): doffing/donning new gown                 Functional mobility during ADLs: Minimal assistance;Rolling walker General ADL Comments: engaged Pt in bil UE HEP and sit<>stand at RW x3 from recliner, Pt's condom cath dislodged and Pt doffing/donning new gown, provided with new chair linens                        Cognition Arousal/Alertness: Awake/alert Behavior During Therapy: WFL for tasks assessed/performed Overall Cognitive Status: Within Functional Limits for tasks assessed                                          Exercises General Exercises - Upper Extremity Shoulder Flexion: AROM;Both;10 reps;Theraband Theraband Level (Shoulder Flexion): Level 2 (Red) Shoulder ABduction: AROM;Both;10 reps;Theraband Theraband Level (Shoulder Abduction): Level 2 (Red) Shoulder ADduction: AROM;Both;10 reps;Theraband Theraband Level (Shoulder Adduction): Level 2 (Red) Elbow Flexion: AROM;Both;10 reps;Theraband Theraband Level (Elbow Flexion): Level 2 (Red) Elbow Extension: AROM;Both;10 reps;Theraband Theraband Level (Elbow Extension): Level 2 (Red)           Pertinent Vitals/ Pain  Pain Assessment: No/denies pain                                                          Frequency  Min 2X/week        Progress Toward Goals  OT Goals(current goals can now be found in the care plan section)  Progress towards OT goals: Progressing toward goals  Acute Rehab OT Goals Patient Stated Goal: to go home  OT Goal Formulation: With patient Time For Goal Achievement: 08/04/17 Potential to Achieve Goals: Good  Plan Discharge plan remains appropriate                    AM-PAC PT "6 Clicks" Daily Activity     Outcome Measure   Help from another person eating meals?: None Help  from another person taking care of personal grooming?: A Little Help from another person toileting, which includes using toliet, bedpan, or urinal?: A Little Help from another person bathing (including washing, rinsing, drying)?: A Lot Help from another person to put on and taking off regular upper body clothing?: A Little Help from another person to put on and taking off regular lower body clothing?: A Lot 6 Click Score: 17    End of Session Equipment Utilized During Treatment: Rolling walker;Oxygen  OT Visit Diagnosis: Unsteadiness on feet (R26.81)   Activity Tolerance Patient tolerated treatment well   Patient Left in chair;with call bell/phone within reach;with chair alarm set   Nurse Communication Mobility status;Other (comment)(need for new condom cath)        Time: 3220-2542 OT Time Calculation (min): 19 min  Charges: OT General Charges $OT Visit: 1 Visit OT Treatments $Therapeutic Activity: 8-22 mins  Lou Cal, [place] Pager [phone] 07/24/2017   Johnny Morrow 07/24/2017, 4:58 PM

## 2017-07-24 NOTE — Telephone Encounter (Signed)
cancel

## 2017-07-24 NOTE — Progress Notes (Signed)
Subjective:  1775 of UOP yest-  No weight- says his breathing  feels much better- crt basically stable  - Objective Vital signs in last 24 hours: Vitals:   07/23/17 1017 07/23/17 1844 07/23/17 2116 07/24/17 0508  BP: (!) 154/88 (!) 158/78 (!) 163/84 (!) 128/92  Pulse: 99 88 72 87  Resp: 18 16 15 14   Temp: 97.7 F (36.5 C) 98 F (36.7 C) 98 F (36.7 C) 97.9 F (36.6 C)  TempSrc: Oral Oral    SpO2: 100% 100% 97% 100%  Weight:      Height:       Weight change:   Intake/Output Summary (Last 24 hours) at 07/24/2017 1047 Last data filed at 07/24/2017 0601 Gross per 24 hour  Intake 830 ml  Output 975 ml  Net -145 ml    Assessment/Plan: 83 year old black male with advanced CKD at baseline.  He now presents with worsening shortness of breath with chest x-ray and exam consistent with volume overload 1.Renal-advanced CKD but has been present long-term.  Current GFR is 11.  Difficult to determine if he is having other uremic symptoms -  I agree and would even go as far to say that I would not start him on dialysis as I cannot anticipate a good outcome for an 83 year old on dialysis- wife and pt aware 2. Hypertension/volume  -volume overloaded.  However, urine output was reasonable on Lasix at 40 mg IV every 12 hours-  inc to 80 BID- better.  He is also on low-dose Lopressor probably more so for his A. fib than his blood pressure.  I will change asix today  to PO 80 BID- if maintains on PO dosing then OK to plan for discharge over weekend 3.  Pneumonia-I suspect these chest x-ray findings are more due to volume than pneumonia.  Continue Maxipime - he may need O2 for home as we will not be able to get him completely dry 4. Anemia  -significant anemia-he claims that this is not new.  He tells me that he was not on an ESA as an outpatient.  iron stores low, repleting  -  also  gave 100 darbe on 12/3- is stable to improving 5. Hypokalemia- repletion ordered 6. Dispo- if maintains, possible d/c over  weekend- has appt with his home nephrologist on 12/18     Johnny Morrow A    Labs: Basic Metabolic Panel: Recent Labs  Lab 07/22/17 0951 07/23/17 0547 07/24/17 0403  NA 139 137 137  K 3.8 3.4* 3.2*  CL 104 99* 99*  CO2 22 26 26   GLUCOSE 101* 141* 111*  BUN 88* 92* 93*  CREATININE 4.40* 4.30* 4.54*  CALCIUM 8.8* 8.7* 8.7*  PHOS  --  4.2 3.8   Liver Function Tests: Recent Labs  Lab 07/23/17 0547 07/24/17 0403  ALBUMIN 2.7* 2.8*   No results for input(s): LIPASE, AMYLASE in the last 168 hours. No results for input(s): AMMONIA in the last 168 hours. CBC: Recent Labs  Lab 07/19/17 1546  07/20/17 0148 07/21/17 0512 07/22/17 0951 07/23/17 0547 07/24/17 0403  WBC 5.9   < > 3.7* 3.9* 4.2 4.0 4.3  NEUTROABS 3.3  --   --   --   --   --   --   HGB 9.1*   < > 8.4* 8.9* 9.6* 8.9* 9.1*  HCT 27.2*   < > 24.9* 26.5* 29.2* 26.8* 27.2*  MCV 84.5   < > 83.3 83.3 83.9 83.5 84.5  PLT 85*   < >  75* 94* 88* 82* 91*   < > = values in this interval not displayed.   Cardiac Enzymes: Recent Labs  Lab 07/19/17 1546 07/19/17 2101 07/20/17 0148 07/20/17 0638  TROPONINI 0.04* 0.05* 0.05* 0.05*   CBG: No results for input(s): GLUCAP in the last 168 hours.  Iron Studies:  No results for input(s): IRON, TIBC, TRANSFERRIN, FERRITIN in the last 72 hours. Studies/Results: No results found. Medications: Infusions: . ferric gluconate (FERRLECIT/NULECIT) IV 125 mg (07/24/17 1036)    Scheduled Medications: . aspirin EC  81 mg Oral BID  . cefUROXime  500 mg Oral Q48H  . cholecalciferol  5,000 Units Oral Daily  . diltiazem  360 mg Oral Daily  . doxazosin  1 mg Oral QPM  . furosemide  80 mg Intravenous Q12H  . isosorbide mononitrate  30 mg Oral Daily  . Melatonin  9 mg Oral QHS  . metoprolol tartrate  12.5 mg Oral BID  . omega-3 acid ethyl esters  2 g Oral Daily  . polyethylene glycol  34 g Oral Daily  . potassium chloride  40 mEq Oral Q4H  . senna  1 tablet Oral q  morning - 10a    have reviewed scheduled and prn medications.  Physical Exam: General: sitting up in chair Heart: RRR Lungs: CBS bilat Abdomen: soft, non tender Extremities: pitting edema    07/24/2017,10:47 AM  LOS: 5 days

## 2017-07-24 NOTE — Care Management Note (Signed)
Case Management Note  Patient Details  Name: Johnny Morrow MRN: [id number] Date of Birth: Nov 09, 1919  CM following for progression and d/c planning.:                    Action/Plan: 07/24/2017 Met with pt and wife, re d/c plans, this CM had notified [name] re possible d/c today. Plan now is for d/c on Saturday, Jul 25, 2017 with [name].  Per pt wife , [name] will provide transportation for this pt to get back to that facility. Pt will d/c to independent living with [name].  11;15am [name] notified that d/c will not take place today.  Pt wife will notify [name] for need for transportation tomorrow, 07/25/2017.   Expected Discharge Date:                  Expected Discharge Plan:  Major  In-House Referral:  NA  Discharge planning Services  CM Consult  Post Acute Care Choice:  Home Health Choice offered to:  Spouse, Patient  DME Arranged:  N/A(has DME) DME Agency:  NA  HH Arranged:  PT, OT, NA HH Agency:  [name]  Status of Service:  Completed, signed off  If discussed at [place] of Stay Meetings, dates discussed:    Additional Comments:  Adron Bene, RN 07/24/2017, 11:23 AM

## 2017-07-24 NOTE — Care Management Important Message (Signed)
Important Message  Patient Details  Name: Johnny Morrow MRN: [id number] Date of Birth: 07-06-1920   Medicare Important Message Given:  Yes    Amara Manalang [address] 07/24/2017, 8:56 AM  Signed on Dec. 6, 2018

## 2017-07-24 NOTE — Progress Notes (Signed)
PROGRESS NOTE    Johnny Morrow  CHE:[id number] DOB: [birth year] DOA: 07/19/2017 PCP: Wenda Low, MD      Brief Narrative:  Johnny Morrow 83 year old African-American male with a past medical history of chronic diastolic CHF paroxysmal atrial fibrillation not on anticoagulation due to severe retroperitoneal bleed in the recent past, history of hypertension, history of DVT in the past, history of atrial flutter, chronic kidney disease stage IV followed at [facility] who lives in a nursing facility and presented with worsening shortness of breath. Concern was for healthcare associated pneumonia. Patient was hospitalized for further management. There was also concern for volume overload. Due to his chronic kidney disease nephrology was consulted to assist with management.  Assessment & Plan:   Principal Problem:   HCAP (healthcare-associated pneumonia) Active Problems:   Essential hypertension, benign   Chronic kidney disease (CKD), stage IV (severe) (HCC)   AF (paroxysmal atrial fibrillation) (HCC)   Chronic diastolic CHF (congestive heart failure) (HCC)   Pneumonia   Acute respiratory failure with hypoxia Etiology likely multifactorial including healthcare associated pneumonia and volume overload due to chronic kidney disease.  VQ scan did not suggest PE. Patient also has a history of diastolic CHF (had follow up appointment with Dr. Dorris Carnes).  He has gained significant amount of weight over the last 2 months.  Received IV Lasix, now switched to PO.  Monitor ins and outs.  Daily weights.  Continue oxygen, wean as able.    Healthcare associated pneumonia Treated with vanco/cefepime initially. Unfortunately no blood cultures were sent at the time of admission. Influenza PCR negative.  Strep pneumonia urinary antigen negative. Deescalate to oral ceftin  Chronic kidney disease stage IV Followed by nephrology at [facility].  Patient has been on diuretics at home.  However he has  gained significant amount of weight loss few weeks.  Nephrology consulted.  Appreciate their input.  Now switched to PO lasix today    Acute on chronic diastolic CHF Fluid overload likely due to combination of CKD as well as CHF.  BNP 2324 on admission. As discussed above.  Echocardiogram from 2017 showed normal systolic function.  Strict ins and outs.  Daily weights.   History of paroxysmal atrial fibrillation Monitor on telemetry.  Continue metoprolol and Cardizem.  Patient not on anticoagulation due to history of severe retroperitoneal bleed.   Anemia of chronic kidney disease Follow CBC closely.  No evidence of overt bleeding.  History of prostate cancer in remission Stable.  Thrombocytopenia  Platelet count was noted to be low.  This appears to be new compared to previous hospitalization.  Etiology is unclear, ?infection.  Counts are stable.  No evidence of bleeding.   DVT prophylaxis: SCD Code Status: Full Family Communication: Wife at bedside, daughter updated over the phone  Disposition Plan: Refused SNF. Home with [facility] on discharge    Consultants:   Nephrology  Procedures:   None  Antimicrobials:  Anti-infectives (From admission, onward)   Start     Dose/Rate Route Frequency Ordered Stop   07/23/17 1800  cefUROXime (CEFTIN) tablet 500 mg     500 mg Oral Every 48 hours 07/23/17 1213 07/27/17 1759   07/21/17 1800  vancomycin (VANCOCIN) IVPB 750 mg/150 ml premix  Status:  Discontinued     750 mg 150 mL/hr over 60 Minutes Intravenous Every 48 hours 07/19/17 1746 07/21/17 1433   07/20/17 1800  ceFEPIme (MAXIPIME) 500 mg in dextrose 5 % 50 mL IVPB  Status:  Discontinued  500 mg 100 mL/hr over 30 Minutes Intravenous Every 24 hours 07/19/17 1946 07/23/17 1213   07/19/17 1700  vancomycin (VANCOCIN) 1,250 mg in sodium chloride 0.9 % 250 mL IVPB     1,250 mg 166.7 mL/hr over 90 Minutes Intravenous  Once 07/19/17 1614 07/19/17 1919   07/19/17 1630  ceFEPIme  (MAXIPIME) 1 g in dextrose 5 % 50 mL IVPB     1 g 100 mL/hr over 30 Minutes Intravenous  Once 07/19/17 1604 07/19/17 1740       Subjective: No new complaints, states he is feeling better. Diuresed well on increased lasix dose. No SOB or CP   Objective: Vitals:   07/23/17 1017 07/23/17 1844 07/23/17 2116 07/24/17 0508  BP: (!) 154/88 (!) 158/78 (!) 163/84 (!) 128/92  Pulse: 99 88 72 87  Resp: 18 16 15 14   Temp: 97.7 F (36.5 C) 98 F (36.7 C) 98 F (36.7 C) 97.9 F (36.6 C)  TempSrc: Oral Oral    SpO2: 100% 100% 97% 100%  Weight:      Height:        Intake/Output Summary (Last 24 hours) at 07/24/2017 1219 Last data filed at 07/24/2017 1036 Gross per 24 hour  Intake 1070 ml  Output 976 ml  Net 94 ml   Filed Weights   07/21/17 0817 07/21/17 2143 07/22/17 2128  Weight: 65 kg (143 lb 4.8 oz) 65.4 kg (144 lb 2.9 oz) 61.7 kg (136 lb 0.4 oz)    Examination:  General exam: Appears calm and comfortable  Respiratory system: CTAB,  Respiratory effort normal. Cardiovascular system: S1 & S2 heard. No JVD, murmurs, rubs, gallops or clicks. +1 pedal edema R>L  Gastrointestinal system: Abdomen is nondistended, soft and nontender. No organomegaly or masses felt. Normal bowel sounds heard. Central nervous system: Alert and oriented. No focal neurological deficits. Extremities: Symmetric 5 x 5 power. Skin: No rashes, lesions or ulcers Psychiatry: Judgement and insight appear normal. Mood & affect appropriate.   Data Reviewed: I have personally reviewed following labs and imaging studies  CBC: Recent Labs  Lab 07/19/17 1546  07/20/17 0148 07/21/17 0512 07/22/17 0951 07/23/17 0547 07/24/17 0403  WBC 5.9   < > 3.7* 3.9* 4.2 4.0 4.3  NEUTROABS 3.3  --   --   --   --   --   --   HGB 9.1*   < > 8.4* 8.9* 9.6* 8.9* 9.1*  HCT 27.2*   < > 24.9* 26.5* 29.2* 26.8* 27.2*  MCV 84.5   < > 83.3 83.3 83.9 83.5 84.5  PLT 85*   < > 75* 94* 88* 82* 91*   < > = values in this interval not  displayed.   Basic Metabolic Panel: Recent Labs  Lab 07/20/17 0148 07/21/17 0512 07/22/17 0951 07/23/17 0547 07/24/17 0403  NA 139 137 139 137 137  K 4.2 4.0 3.8 3.4* 3.2*  CL 110 102 104 99* 99*  CO2 22 23 22 26 26   GLUCOSE 125* 121* 101* 141* 111*  BUN 86* 86* 88* 92* 93*  CREATININE 4.22* 4.33* 4.40* 4.30* 4.54*  CALCIUM 8.4* 8.5* 8.8* 8.7* 8.7*  PHOS  --   --   --  4.2 3.8   GFR: Estimated Creatinine Clearance: 8.5 mL/min (A) (by C-G formula based on SCr of 4.54 mg/dL (H)). Liver Function Tests: Recent Labs  Lab 07/23/17 0547 07/24/17 0403  ALBUMIN 2.7* 2.8*   No results for input(s): LIPASE, AMYLASE in the last 168 hours. No  results for input(s): AMMONIA in the last 168 hours. Coagulation Profile: No results for input(s): INR, PROTIME in the last 168 hours. Cardiac Enzymes: Recent Labs  Lab 07/19/17 1546 07/19/17 2101 07/20/17 0148 07/20/17 0638  TROPONINI 0.04* 0.05* 0.05* 0.05*   BNP (last 3 results) Recent Labs    08/20/16 1221 12/22/16 1453  PROBNP 373.0* 999.0*   HbA1C: No results for input(s): HGBA1C in the last 72 hours. CBG: No results for input(s): GLUCAP in the last 168 hours. Lipid Profile: No results for input(s): CHOL, HDL, LDLCALC, TRIG, CHOLHDL, LDLDIRECT in the last 72 hours. Thyroid Function Tests: No results for input(s): TSH, T4TOTAL, FREET4, T3FREE, THYROIDAB in the last 72 hours. Anemia Panel: No results for input(s): VITAMINB12, FOLATE, FERRITIN, TIBC, IRON, RETICCTPCT in the last 72 hours. Sepsis Labs: No results for input(s): PROCALCITON, LATICACIDVEN in the last 168 hours.  Recent Results (from the past 240 hour(s))  MRSA PCR Screening     Status: None   Collection Time: 07/19/17 11:03 PM  Result Value Ref Range Status   MRSA by PCR NEGATIVE NEGATIVE Final    Comment:        The GeneXpert MRSA Assay (FDA approved for NASAL specimens only), is one component of a comprehensive MRSA colonization surveillance program.  It is not intended to diagnose MRSA infection nor to guide or monitor treatment for MRSA infections.        Radiology Studies: No results found.    Scheduled Meds: . aspirin EC  81 mg Oral BID  . cefUROXime  500 mg Oral Q48H  . cholecalciferol  5,000 Units Oral Daily  . diltiazem  360 mg Oral Daily  . doxazosin  1 mg Oral QPM  . furosemide  80 mg Oral BID  . isosorbide mononitrate  30 mg Oral Daily  . Melatonin  9 mg Oral QHS  . metoprolol tartrate  12.5 mg Oral BID  . omega-3 acid ethyl esters  2 g Oral Daily  . polyethylene glycol  34 g Oral Daily  . potassium chloride  40 mEq Oral Q4H  . senna  1 tablet Oral q morning - 10a   Continuous Infusions:    LOS: 5 days    Time spent: 30 minutes   Dessa Phi, DO [facility] [email or web address] Password [email or web address] 07/24/2017, 12:19 PM

## 2017-07-25 LAB — CBC
HCT: 27.1 % — ABNORMAL LOW (ref 39.0–52.0)
Hemoglobin: 9.1 g/dL — ABNORMAL LOW (ref 13.0–17.0)
MCH: 28.2 pg (ref 26.0–34.0)
MCHC: 33.6 g/dL (ref 30.0–36.0)
MCV: 83.9 fL (ref 78.0–100.0)
PLATELETS: 92 10*3/uL — AB (ref 150–400)
RBC: 3.23 MIL/uL — AB (ref 4.22–5.81)
RDW: 15.3 % (ref 11.5–15.5)
WBC: 4.2 10*3/uL (ref 4.0–10.5)

## 2017-07-25 LAB — RENAL FUNCTION PANEL
Albumin: 2.7 g/dL — ABNORMAL LOW (ref 3.5–5.0)
Anion gap: 11 (ref 5–15)
BUN: 95 mg/dL — AB (ref 6–20)
CHLORIDE: 98 mmol/L — AB (ref 101–111)
CO2: 27 mmol/L (ref 22–32)
CREATININE: 4.37 mg/dL — AB (ref 0.61–1.24)
Calcium: 8.7 mg/dL — ABNORMAL LOW (ref 8.9–10.3)
GFR calc Af Amer: 12 mL/min — ABNORMAL LOW (ref >60)
GFR calc non Af Amer: 10 mL/min — ABNORMAL LOW (ref >60)
Glucose, Bld: 114 mg/dL — ABNORMAL HIGH (ref 65–99)
Phosphorus: 3.4 mg/dL (ref 2.5–4.6)
Potassium: 4.2 mmol/L (ref 3.5–5.1)
Sodium: 136 mmol/L (ref 135–145)

## 2017-07-25 MED ORDER — FUROSEMIDE 80 MG PO TABS: 80.0000 mg | ORAL_TABLET | Freq: Two times a day (BID) | ORAL | 0 refills | Status: DC

## 2017-07-25 MED ORDER — POTASSIUM CHLORIDE ER 20 MEQ PO TBCR: 20.0000 meq | EXTENDED_RELEASE_TABLET | Freq: Every day | ORAL | 0 refills | Status: AC

## 2017-07-25 NOTE — Discharge Summary (Signed)
Physician Discharge Summary  Johnny Morrow MGQ:[id number] DOB: [birth year] DOA: 07/19/2017  PCP: Wenda Low, MD  Admit date: 07/19/2017 Discharge date: 07/25/2017  Admitted From: Home Disposition:  Home with [place]. Refused SNF.   Recommendations for Outpatient Follow-up:  1. Follow up with PCP in 1 week 2. Follow up with Nephrology Dr. Salvatore Decent at [facility]  3. Please obtain BMP/CBC in 1 week   Home Health: PT OT aide  Equipment/Devices: None   Discharge Condition: Stable CODE STATUS: Full  Diet recommendation: Heart healthy   Brief/Interim Summary: Johnny Morrow 83 year old African-American male with a past medical history of chronic diastolic CHF paroxysmal atrial fibrillation not on anticoagulation due to severe retroperitoneal bleed in the recent past, history of hypertension, history of DVT in the past, history of atrial flutter, chronic kidney disease stage IV followed at [facility] who lives in a nursing facility and presented with worsening shortness of breath.Concern was for healthcare associated pneumonia.Patient was hospitalized for further management.There was also concern for volume overload. Due to his chronic kidney disease nephrology was consulted to assist with management.  Patient was not deemed to be a good dialysis candidate at age 83 with other medical comorbidities. He was treated with IV lasix with diuresis. This was transitioned to PO prior to discharge. He continued to slowly improve. PT recommended SNF which patient and family declined.   Discharge Diagnoses:  Principal Problem:   HCAP (healthcare-associated pneumonia) Active Problems:   Essential hypertension, benign   Chronic kidney disease (CKD), stage IV (severe) (HCC)   AF (paroxysmal atrial fibrillation) (HCC)   Chronic diastolic CHF (congestive heart failure) (HCC)   Pneumonia  Acute respiratory failure with hypoxia Etiology likely multifactorial including healthcare associated pneumonia  and volume overload due to chronic kidney disease. VQ scan did not suggest PE. Patient also has a history of diastolic CHF (had follow up appointment with Dr. Dorris Carnes). He has gained significant amount of weight over the last 2 months.Received IV Lasix, now switched to PO. Monitor ins and outs. Daily weights. Wean off [place] O2 prior to discharge.   Healthcare associated pneumonia Treated with vanco/cefepime initially. Unfortunately no blood cultures were sent at the time of admission.Influenza PCR negative. Strep pneumonia urinary antigen negative. Deescalate to oral ceftin. Completed treatment.   Chronic kidney disease stage IV Followed by nephrology at [facility]. Patient has been on diuretics at home. However he has gained significant amount of weight loss few weeks. Nephrology consulted. Appreciate their input. Now switched to PO lasix 80mg  BID and 51meq KCl on discharge. Follow up with [facility].   Acute on chronic diastolic CHF Fluid overload likely due to combination of CKD as well as CHF. BNP 2324 on admission. As discussed above. Echocardiogram from 2017 showed normal systolic function. Strict ins and outs. Daily weights.   History of paroxysmal atrial fibrillation Monitor on telemetry. Continue metoprolol and Cardizem. Patient not on anticoagulation due to history of severe retroperitoneal bleed.   Anemia of chronic kidney disease Follow CBC closely. No evidence of overt bleeding.  History of prostate cancer in remission Stable.  Thrombocytopenia  Platelet count was noted to be low. This appears to be new compared to previous hospitalization. Etiology is unclear, ?infection. Counts are stable. No evidence of bleeding.   Discharge Instructions  Discharge Instructions    Call MD for:  difficulty breathing, headache or visual disturbances   Complete by:  As directed    Call MD for:  extreme fatigue   Complete  by:  As directed    Call MD  for:  persistant dizziness or light-headedness   Complete by:  As directed    Call MD for:  persistant nausea and vomiting   Complete by:  As directed    Call MD for:  severe uncontrolled pain   Complete by:  As directed    Call MD for:  temperature >100.4   Complete by:  As directed    Diet - low sodium heart healthy   Complete by:  As directed    Discharge instructions   Complete by:  As directed    You were cared for by a hospitalist during your hospital stay. If you have any questions about your discharge medications or the care you received while you were in the hospital after you are discharged, you can call the unit and asked to speak with the hospitalist on call if the hospitalist that took care of you is not available. Once you are discharged, your primary care physician will handle any further medical issues. Please note that NO REFILLS for any discharge medications will be authorized once you are discharged, as it is imperative that you return to your primary care physician (or establish a relationship with a primary care physician if you do not have one) for your aftercare needs so that they can reassess your need for medications and monitor your lab values.   Increase activity slowly   Complete by:  As directed      Allergies as of 07/25/2017      Reactions   Iodinated Diagnostic Agents Rash   Ace Inhibitors Other (See Comments)   Noted (on MAR) to be "allergic," but no reaction was cited/noted   [employer] Other (See Comments)   Noted (on MAR) to be "allergic," but no reaction was cited/noted   Penicillins Rash   Has patient had a PCN reaction causing immediate rash, facial/tongue/throat swelling, SOB or lightheadedness with hypotension: Yes Has patient had a PCN reaction causing severe rash involving mucus membranes or skin necrosis: Unknown Has patient had a PCN reaction that required hospitalization: Unknown Has patient had a PCN reaction occurring within  the last 10 years: Unknown If all of the above answers are "NO", then may proceed with Cephalosporin use.      Medication List    TAKE these medications   albuterol 108 (90 Base) MCG/ACT inhaler Commonly known as:  PROVENTIL HFA;VENTOLIN HFA Inhale 2 puffs into the lungs every 6 (six) hours as needed for wheezing or shortness of breath.   aspirin EC 81 MG tablet Take 1 tablet (81 mg total) by mouth 2 (two) times daily.   bisacodyl 5 MG EC tablet Commonly known as:  DULCOLAX Take 1 tablet (5 mg total) by mouth daily as needed for moderate constipation.   Co Q 10 100 MG Caps Take 100 mg by mouth daily.   diltiazem 360 MG 24 hr capsule Commonly known as:  CARDIZEM CD Take 1 capsule (360 mg total) daily by mouth.   doxazosin 1 MG tablet Commonly known as:  CARDURA Take 1 mg by mouth every evening.   ferrous sulfate 325 (65 FE) MG tablet Take 1 tablet (325 mg total) by mouth daily with breakfast.   furosemide 80 MG tablet Commonly known as:  LASIX Take 1 tablet (80 mg total) by mouth 2 (two) times daily. What changed:    medication strength  how much to take  when to take this  reasons to take  this   isosorbide mononitrate 60 MG 24 hr tablet Commonly known as:  IMDUR Take 0.5 tablets (30 mg total) daily by mouth.   levalbuterol 0.63 MG/3ML nebulizer solution Commonly known as:  XOPENEX Take 0.63 mg by nebulization at bedtime as needed for wheezing or shortness of breath.   Melatonin 3 MG Tabs Take 10 mg by mouth at bedtime.   metoprolol tartrate 25 MG tablet Commonly known as:  LOPRESSOR Take 0.5 tablets (12.5 mg total) 2 (two) times daily by mouth. What changed:  additional instructions   MULTI VITAMIN PO Take 1 tablet by mouth daily.   Omega 3 1000 MG Caps Take 2,000 mg by mouth daily.   polyethylene glycol packet Commonly known as:  MIRALAX / GLYCOLAX Take 17 g by mouth daily. What changed:  how much to take   Potassium Chloride ER 20 MEQ  Tbcr Take 20 mEq by mouth daily.   PROBIOTIC PO Chew 1 tablet (250 million cell) by mouth once a day   senna 8.6 MG Tabs tablet Commonly known as:  SENOKOT Take 1 tablet by mouth every morning.   traMADol 50 MG tablet Commonly known as:  ULTRAM Take 1 tablet (50 mg total) every 6 (six) hours as needed by mouth for moderate pain.   Vitamin D3 5000 units Tabs Take 5,000 Units by mouth daily.   VITAMIN-B COMPLEX PO Take 1 tablet by mouth daily.      Follow-up Information    [facility] Follow up.   Specialty:  Home Health Services Why:  A representative from [facility] will contact you to arrange start date and time for your Home Health services. Contact information: [place] [address] [phone]        Wenda Low, MD. Schedule an appointment as soon as possible for a visit in 1 week(s).   Specialty:  Internal Medicine Contact information: [address] [place] [address] [phone]        Quinn Plowman, MD. Schedule an appointment as soon as possible for a visit in 1 week(s).   Specialty:  Nephrology Contact information: [place] [address] [phone]        Fay Records, MD Follow up.   Specialty:  Cardiology Contact information: [address] [place] [address] [phone]          Allergies  Allergen Reactions  . Iodinated Diagnostic Agents Rash  . Ace Inhibitors Other (See Comments)    Noted (on MAR) to be "allergic," but no reaction was cited/noted  . Shellfish-Derived Products Other (See Comments)    Noted (on MAR) to be "allergic," but no reaction was cited/noted  . Penicillins Rash    Has patient had a PCN reaction causing immediate rash, facial/tongue/throat swelling, SOB or lightheadedness with hypotension: Yes Has patient had a PCN reaction causing severe rash involving mucus membranes or skin necrosis:  Unknown Has patient had a PCN reaction that required hospitalization: Unknown Has patient had a PCN reaction occurring within the last 10 years: Unknown If all of the above answers are "NO", then may proceed with Cephalosporin use.     Consultations:  Nephrology    Procedures/Studies: Nm Pulmonary Perf And Vent  Result Date: 07/20/2017 CLINICAL DATA:  Positive D-dimer EXAM: NUCLEAR MEDICINE VENTILATION - PERFUSION LUNG SCAN TECHNIQUE: Ventilation images were obtained in multiple projections using inhaled aerosol Tc-32m DTPA. Perfusion images were obtained in multiple  projections after intravenous injection of Tc-69m MAA. RADIOPHARMACEUTICALS:  32 mCi Technetium-84m DTPA aerosol inhalation and 4.2 mCi Technetium-28m MAA IV COMPARISON:  05/16/2017 and chest x-ray from previous day FINDINGS: Ventilation: The overall ventilation is decrease when compared with the prior exam although no large defect is seen. Some central trapping is noted which can be seen with COPD. Perfusion: Perfusion imaging demonstrates some slight decrease activity in the bases bilaterally related to infiltrative changes seen on the recent chest x-ray. No definitive undulation perfusion mismatch is noted to suggest pulmonary embolism. IMPRESSION: Slight decreased ventilation and perfusion in the bases bilaterally related to recent infiltrates on chest x-ray. No definitive ventilation-perfusion mismatch is noted to suggest pulmonary embolism. Electronically Signed   By: Inez Catalina M.D.   On: 07/20/2017 14:58   Dg Chest Portable 1 View  Result Date: 07/19/2017 CLINICAL DATA:  Acute shortness of breath and wheezing. EXAM: PORTABLE CHEST 1 VIEW COMPARISON:  06/15/2017 and prior chest radiograph FINDINGS: Cardiomegaly and pulmonary vascular congestion noted Increased bilateral lower lung opacities/ airspace disease noted. Probable trace right pleural effusion noted. There is no evidence of pneumothorax or acute bony abnormality.  IMPRESSION: Increasing bilateral lower lung opacities -question atelectasis and/or airspace disease/ pneumonia. Probable trace right pleural effusion. Cardiomegaly and pulmonary vascular congestion. Electronically Signed   By: Margarette Canada M.D.   On: 07/19/2017 16:10      Discharge Exam: Vitals:   07/25/17 1148 07/25/17 1150  BP: (!) 140/93 (!) 140/93  Pulse:  (!) 113  Resp:    Temp:    SpO2:     Vitals:   07/25/17 0441 07/25/17 0535 07/25/17 1148 07/25/17 1150  BP:  (!) 123/94 (!) 140/93 (!) 140/93  Pulse:  (!) 106  (!) 113  Resp:  18    Temp:  (!) 97.4 F (36.3 C)    TempSrc:  Oral    SpO2:  96%    Weight: 69.9 kg (154 lb 1.6 oz)     Height:        General: Pt is alert, awake, not in acute distress Cardiovascular: RRR, S1/S2 +, no rubs, no gallops Respiratory: CTA bilaterally, no wheezing, no rhonchi Abdominal: Soft, NT, ND, bowel sounds + Extremities: trace edema RLE much improved from previous exams, no cyanosis    The results of significant diagnostics from this hospitalization (including imaging, microbiology, ancillary and laboratory) are listed below for reference.     Microbiology: Recent Results (from the past 240 hour(s))  MRSA PCR Screening     Status: None   Collection Time: 07/19/17 11:03 PM  Result Value Ref Range Status   MRSA by PCR NEGATIVE NEGATIVE Final    Comment:        The GeneXpert MRSA Assay (FDA approved for NASAL specimens only), is one component of a comprehensive MRSA colonization surveillance program. It is not intended to diagnose MRSA infection nor to guide or monitor treatment for MRSA infections.      Labs: BNP (last 3 results) Recent Labs    06/07/17 1022 07/19/17 1828  BNP 409.3* 4,401.0*   Basic Metabolic Panel: Recent Labs  Lab 07/21/17 0512 07/22/17 0951 07/23/17 0547 07/24/17 0403 07/25/17 0425  NA 137 139 137 137 136  K 4.0 3.8 3.4* 3.2* 4.2  CL 102 104 99* 99* 98*  CO2 23 22 26 26 27   GLUCOSE 121*  101* 141* 111* 114*  BUN 86* 88* 92* 93* 95*  CREATININE 4.33* 4.40* 4.30* 4.54* 4.37*  CALCIUM 8.5* 8.8*  8.7* 8.7* 8.7*  PHOS  --   --  4.2 3.8 3.4   Liver Function Tests: Recent Labs  Lab 07/23/17 0547 07/24/17 0403 07/25/17 0425  ALBUMIN 2.7* 2.8* 2.7*   No results for input(s): LIPASE, AMYLASE in the last 168 hours. No results for input(s): AMMONIA in the last 168 hours. CBC: Recent Labs  Lab 07/19/17 1546  07/21/17 0512 07/22/17 0951 07/23/17 0547 07/24/17 0403 07/25/17 0425  WBC 5.9   < > 3.9* 4.2 4.0 4.3 4.2  NEUTROABS 3.3  --   --   --   --   --   --   HGB 9.1*   < > 8.9* 9.6* 8.9* 9.1* 9.1*  HCT 27.2*   < > 26.5* 29.2* 26.8* 27.2* 27.1*  MCV 84.5   < > 83.3 83.9 83.5 84.5 83.9  PLT 85*   < > 94* 88* 82* 91* 92*   < > = values in this interval not displayed.   Cardiac Enzymes: Recent Labs  Lab 07/19/17 1546 07/19/17 2101 07/20/17 0148 07/20/17 0638  TROPONINI 0.04* 0.05* 0.05* 0.05*   BNP: Invalid input(s): POCBNP CBG: No results for input(s): GLUCAP in the last 168 hours. D-Dimer No results for input(s): DDIMER in the last 72 hours. Hgb A1c No results for input(s): HGBA1C in the last 72 hours. Lipid Profile No results for input(s): CHOL, HDL, LDLCALC, TRIG, CHOLHDL, LDLDIRECT in the last 72 hours. Thyroid function studies No results for input(s): TSH, T4TOTAL, T3FREE, THYROIDAB in the last 72 hours.  Invalid input(s): FREET3 Anemia work up No results for input(s): VITAMINB12, FOLATE, FERRITIN, TIBC, IRON, RETICCTPCT in the last 72 hours. Urinalysis    Component Value Date/Time   COLORURINE YELLOW 06/07/2017 1313   APPEARANCEUR CLEAR 06/07/2017 1313   LABSPEC 1.013 06/07/2017 1313   PHURINE 5.0 06/07/2017 1313   GLUCOSEU NEGATIVE 06/07/2017 1313   HGBUR NEGATIVE 06/07/2017 1313   BILIRUBINUR NEGATIVE 06/07/2017 1313   KETONESUR NEGATIVE 06/07/2017 1313   PROTEINUR 100 (A) 06/07/2017 1313   NITRITE NEGATIVE 06/07/2017 1313   LEUKOCYTESUR  NEGATIVE 06/07/2017 1313   Sepsis Labs Invalid input(s): PROCALCITONIN,  WBC,  LACTICIDVEN Microbiology Recent Results (from the past 240 hour(s))  MRSA PCR Screening     Status: None   Collection Time: 07/19/17 11:03 PM  Result Value Ref Range Status   MRSA by PCR NEGATIVE NEGATIVE Final    Comment:        The GeneXpert MRSA Assay (FDA approved for NASAL specimens only), is one component of a comprehensive MRSA colonization surveillance program. It is not intended to diagnose MRSA infection nor to guide or monitor treatment for MRSA infections.      Time coordinating discharge: 40 minutes  SIGNED:  Dessa Phi, DO [facility] Pager [phone]  If 7PM-7AM, please contact night-coverage [email or web address] Password [facility] 07/25/2017, 3:19 PM

## 2017-07-25 NOTE — Progress Notes (Signed)
Patient oxygen saturation is ranging between 90-93% on RA after O2 home saturation screening. MD notified. MD stated that patient no longer needs oxygen for home use. Patient and wife notified. Will continue to monitor.

## 2017-07-25 NOTE — Progress Notes (Signed)
Subjective:  At least 950 of UOP yest-   Weight actually reading as heaviest this hosp- says his breathing  feels much better- crt basically stable  - Objective Vital signs in last 24 hours: Vitals:   07/24/17 2148 07/25/17 0319 07/25/17 0441 07/25/17 0535  BP: (!) 134/101 139/80  (!) 123/94  Pulse: (!) 113 (!) 108  (!) 106  Resp: 18 16  18   Temp: (!) 97.4 F (36.3 C) (!) 97.3 F (36.3 C)  (!) 97.4 F (36.3 C)  TempSrc: Oral Oral  Oral  SpO2: 99% 97%  96%  Weight: 69.9 kg (154 lb)  69.9 kg (154 lb 1.6 oz)   Height:       Weight change:   Intake/Output Summary (Last 24 hours) at 07/25/2017 0936 Last data filed at 07/25/2017 0848 Gross per 24 hour  Intake 560 ml  Output 1301 ml  Net -741 ml    Assessment/Plan: 83 year old black male with advanced CKD at baseline.  He now presents with worsening shortness of breath with chest x-ray and exam consistent with volume overload 1.Renal-advanced CKD but has been present long-term.  Current GFR is 11.  Difficult to determine if he is having other uremic symptoms -  I agree and would even go as far to say that I would not start him on dialysis as I cannot anticipate a good outcome for an 84 year old on dialysis- wife and pt aware 2. Hypertension/volume  -volume overloaded.  However, urine output  Reasonable now  on Lasix at 80 mg PO  BID.  He is also on low-dose Lopressor probably more so for his A. fib than his blood pressure. We will not be able to get all volume off without getting him uremic. From renal and volume standpoint I am OK to plan for discharge  3.  Pneumonia-I suspect these chest x-ray findings are more due to volume than pneumonia.  Had been on Maxipime - now ceftin- he may need O2 for home as we will not be able to get him completely dry 4. Anemia  -significant anemia-he claims that this is not new.  He tells me that he was not on an ESA as an outpatient.  iron stores low, repleting  -  also  gave 100 darbe on 12/3- is stable to  improving 5. Hypokalemia- repletion ordered- would say 20 daily until he can be seen by his home nephrologist 6. Dispo- if maintains, possible d/c over weekend- has appt with his home nephrologist on 12/18- Renal will sign off- call with questions     Aedon Deason A    Labs: Basic Metabolic Panel: Recent Labs  Lab 07/23/17 0547 07/24/17 0403 07/25/17 0425  NA 137 137 136  K 3.4* 3.2* 4.2  CL 99* 99* 98*  CO2 26 26 27   GLUCOSE 141* 111* 114*  BUN 92* 93* 95*  CREATININE 4.30* 4.54* 4.37*  CALCIUM 8.7* 8.7* 8.7*  PHOS 4.2 3.8 3.4   Liver Function Tests: Recent Labs  Lab 07/23/17 0547 07/24/17 0403 07/25/17 0425  ALBUMIN 2.7* 2.8* 2.7*   No results for input(s): LIPASE, AMYLASE in the last 168 hours. No results for input(s): AMMONIA in the last 168 hours. CBC: Recent Labs  Lab 07/19/17 1546  07/21/17 0512 07/22/17 0951 07/23/17 0547 07/24/17 0403 07/25/17 0425  WBC 5.9   < > 3.9* 4.2 4.0 4.3 4.2  NEUTROABS 3.3  --   --   --   --   --   --   HGB  9.1*   < > 8.9* 9.6* 8.9* 9.1* 9.1*  HCT 27.2*   < > 26.5* 29.2* 26.8* 27.2* 27.1*  MCV 84.5   < > 83.3 83.9 83.5 84.5 83.9  PLT 85*   < > 94* 88* 82* 91* 92*   < > = values in this interval not displayed.   Cardiac Enzymes: Recent Labs  Lab 07/19/17 1546 07/19/17 2101 07/20/17 0148 07/20/17 0638  TROPONINI 0.04* 0.05* 0.05* 0.05*   CBG: No results for input(s): GLUCAP in the last 168 hours.  Iron Studies:  No results for input(s): IRON, TIBC, TRANSFERRIN, FERRITIN in the last 72 hours. Studies/Results: No results found. Medications: Infusions:   Scheduled Medications: . aspirin EC  81 mg Oral BID  . cefUROXime  500 mg Oral Q48H  . cholecalciferol  5,000 Units Oral Daily  . diltiazem  360 mg Oral Daily  . doxazosin  1 mg Oral QPM  . furosemide  80 mg Oral BID  . isosorbide mononitrate  30 mg Oral Daily  . Melatonin  9 mg Oral QHS  . metoprolol tartrate  12.5 mg Oral BID  . omega-3 acid  ethyl esters  2 g Oral Daily  . polyethylene glycol  34 g Oral Daily  . senna  1 tablet Oral q morning - 10a    have reviewed scheduled and prn medications.  Physical Exam: General: sitting up in chair Heart: RRR Lungs: CBS bilat Abdomen: soft, non tender Extremities: pitting edema    07/25/2017,9:36 AM  LOS: 6 days

## 2017-07-25 NOTE — Progress Notes (Signed)
Home O2 screening preformed per MD request and MD notified of findings. MD stated that she would order oxygen for patient at home. Patient and wife now requesting that home O2 screening be preformed again. NT notified to repeat home O2 screening. Will update MD of new findings.

## 2017-07-25 NOTE — Progress Notes (Signed)
Johnny Morrow to be D/C'd Home per MD order.  Discussed prescriptions and follow up appointments with the patient. Prescriptions given to patient, medication list explained in detail. Pt verbalized understanding.  Allergies as of 07/25/2017      Reactions   Iodinated Diagnostic Agents Rash   Ace Inhibitors Other (See Comments)   Noted (on MAR) to be "allergic," but no reaction was cited/noted   Enter[employer] Products Other (See Comments)   Noted (on MAR) to be "allergic," but no reaction was cited/noted   Penicillins Rash   Has patient had a PCN reaction causing immediate rash, facial/tongue/throat swelling, SOB or lightheadedness with hypotension: Yes Has patient had a PCN reaction causing severe rash involving mucus membranes or skin necrosis: Unknown Has patient had a PCN reaction that required hospitalization: Unknown Has patient had a PCN reaction occurring within the last 10 years: Unknown If all of the above answers are "NO", then may proceed with Cephalosporin use.      Medication List    TAKE these medications   albuterol 108 (90 Base) MCG/ACT inhaler Commonly known as:  PROVENTIL HFA;VENTOLIN HFA Inhale 2 puffs into the lungs every 6 (six) hours as needed for wheezing or shortness of breath.   aspirin EC 81 MG tablet Take 1 tablet (81 mg total) by mouth 2 (two) times daily.   bisacodyl 5 MG EC tablet Commonly known as:  DULCOLAX Take 1 tablet (5 mg total) by mouth daily as needed for moderate constipation.   Co Q 10 100 MG Caps Take 100 mg by mouth daily.   diltiazem 360 MG 24 hr capsule Commonly known as:  CARDIZEM CD Take 1 capsule (360 mg total) daily by mouth.   doxazosin 1 MG tablet Commonly known as:  CARDURA Take 1 mg by mouth every evening.   ferrous sulfate 325 (65 FE) MG tablet Take 1 tablet (325 mg total) by mouth daily with breakfast.   furosemide 80 MG tablet Commonly known as:  LASIX Take 1 tablet (80 mg total) by mouth 2 (two) times daily. What  changed:    medication strength  how much to take  when to take this  reasons to take this   isosorbide mononitrate 60 MG 24 hr tablet Commonly known as:  IMDUR Take 0.5 tablets (30 mg total) daily by mouth.   levalbuterol 0.63 MG/3ML nebulizer solution Commonly known as:  XOPENEX Take 0.63 mg by nebulization at bedtime as needed for wheezing or shortness of breath.   Melatonin 3 MG Tabs Take 10 mg by mouth at bedtime.   metoprolol tartrate 25 MG tablet Commonly known as:  LOPRESSOR Take 0.5 tablets (12.5 mg total) 2 (two) times daily by mouth. What changed:  additional instructions   MULTI VITAMIN PO Take 1 tablet by mouth daily.   Omega 3 1000 MG Caps Take 2,000 mg by mouth daily.   polyethylene glycol packet Commonly known as:  MIRALAX / GLYCOLAX Take 17 g by mouth daily. What changed:  how much to take   Potassium Chloride ER 20 MEQ Tbcr Take 20 mEq by mouth daily.   PROBIOTIC PO Chew 1 tablet (250 million cell) by mouth once a day   senna 8.6 MG Tabs tablet Commonly known as:  SENOKOT Take 1 tablet by mouth every morning.   traMADol 50 MG tablet Commonly known as:  ULTRAM Take 1 tablet (50 mg total) every 6 (six) hours as needed by mouth for moderate pain.   Vitamin D3 5000 units Tabs  Take 5,000 Units by mouth daily.   VITAMIN-B COMPLEX PO Take 1 tablet by mouth daily.       Vitals:   07/25/17 1148 07/25/17 1150  BP: (!) 140/93 (!) 140/93  Pulse:  (!) 113  Resp:    Temp:    SpO2:      Skin clean, dry and intact without evidence of skin break down, no evidence of skin tears noted. IV catheter discontinued intact. Site without signs and symptoms of complications. Dressing and pressure applied. Pt denies pain at this time. No complaints noted.  An After Visit Summary was printed and given to the patient. Patient escorted via [place], and D/C home via private auto.  Dixie Dials RN, BSN

## 2017-07-26 ENCOUNTER — Other Ambulatory Visit: Payer: Self-pay | Admitting: Internal Medicine

## 2017-07-26 MED ORDER — METOPROLOL TARTRATE 25 MG PO TABS: 12.5000 mg | ORAL_TABLET | Freq: Two times a day (BID) | ORAL | 0 refills | Status: AC

## 2017-07-26 MED ORDER — DILTIAZEM HCL ER COATED BEADS 360 MG PO CP24: 360.0000 mg | ORAL_CAPSULE | Freq: Every day | ORAL | 0 refills | Status: AC

## 2017-07-27 DIAGNOSIS — I130 Hypertensive heart and chronic kidney disease with heart failure and stage 1 through stage 4 chronic kidney disease, or unspecified chronic kidney disease: Secondary | ICD-10-CM | POA: Diagnosis not present

## 2017-07-27 DIAGNOSIS — J189 Pneumonia, unspecified organism: Secondary | ICD-10-CM | POA: Diagnosis not present

## 2017-07-28 DIAGNOSIS — I130 Hypertensive heart and chronic kidney disease with heart failure and stage 1 through stage 4 chronic kidney disease, or unspecified chronic kidney disease: Secondary | ICD-10-CM | POA: Diagnosis not present

## 2017-07-28 DIAGNOSIS — J189 Pneumonia, unspecified organism: Secondary | ICD-10-CM | POA: Diagnosis not present

## 2017-07-30 DIAGNOSIS — N184 Chronic kidney disease, stage 4 (severe): Secondary | ICD-10-CM | POA: Diagnosis not present

## 2017-07-30 DIAGNOSIS — J189 Pneumonia, unspecified organism: Secondary | ICD-10-CM | POA: Diagnosis not present

## 2017-07-30 DIAGNOSIS — I4892 Unspecified atrial flutter: Secondary | ICD-10-CM | POA: Diagnosis not present

## 2017-07-30 DIAGNOSIS — I509 Heart failure, unspecified: Secondary | ICD-10-CM | POA: Diagnosis not present

## 2017-07-30 DIAGNOSIS — I10 Essential (primary) hypertension: Secondary | ICD-10-CM | POA: Diagnosis not present

## 2017-07-31 ENCOUNTER — Encounter: Payer: Self-pay | Admitting: Cardiology

## 2017-07-31 ENCOUNTER — Ambulatory Visit (INDEPENDENT_AMBULATORY_CARE_PROVIDER_SITE_OTHER): Payer: Medicare Other

## 2017-07-31 ENCOUNTER — Ambulatory Visit (INDEPENDENT_AMBULATORY_CARE_PROVIDER_SITE_OTHER): Payer: Medicare Other | Admitting: Cardiology

## 2017-07-31 VITALS — BP 110/68 | HR 71 | Ht 65.0 in | Wt 130.6 lb

## 2017-07-31 DIAGNOSIS — I350 Nonrheumatic aortic (valve) stenosis: Secondary | ICD-10-CM

## 2017-07-31 DIAGNOSIS — I4892 Unspecified atrial flutter: Secondary | ICD-10-CM | POA: Diagnosis not present

## 2017-07-31 DIAGNOSIS — I480 Paroxysmal atrial fibrillation: Secondary | ICD-10-CM | POA: Diagnosis not present

## 2017-07-31 DIAGNOSIS — E7800 Pure hypercholesterolemia, unspecified: Secondary | ICD-10-CM

## 2017-07-31 DIAGNOSIS — I5032 Chronic diastolic (congestive) heart failure: Secondary | ICD-10-CM | POA: Diagnosis not present

## 2017-07-31 DIAGNOSIS — Z4789 Encounter for other orthopedic aftercare: Secondary | ICD-10-CM | POA: Diagnosis not present

## 2017-07-31 DIAGNOSIS — I10 Essential (primary) hypertension: Secondary | ICD-10-CM | POA: Diagnosis not present

## 2017-07-31 DIAGNOSIS — S72141D Displaced intertrochanteric fracture of right femur, subsequent encounter for closed fracture with routine healing: Secondary | ICD-10-CM | POA: Diagnosis not present

## 2017-07-31 NOTE — Patient Instructions (Signed)
Medication Instructions:  Your physician recommends that you continue on your current medications as directed. Please refer to the Current Medication list given to you today.   Labwork: None ordered  Testing/Procedures: Your physician has recommended that you wear a 24-hour holter monitor. Holter monitors are medical devices that record the heart's electrical activity. Doctors most often use these monitors to diagnose arrhythmias. Arrhythmias are problems with the speed or rhythm of the heartbeat. The monitor is a small, portable device. You can wear one while you do your normal daily activities. This is usually used to diagnose what is causing palpitations/syncope (passing out).    Follow-Up: Your physician wants you to follow-up in: 6 months with APP on Dr. Theodosia Blender team. Dennis Bast will receive a reminder letter in the mail two months in advance. If you don't receive a letter, please call our office to schedule the follow-up appointment.   Your physician wants you to follow-up in: 1 year with Dr. Radford Pax. You will receive a reminder letter in the mail two months in advance. If you don't receive a letter, please call our office to schedule the follow-up appointment.   Any Other Special Instructions Will Be Listed Below (If Applicable).     If you need a refill on your cardiac medications before your next appointment, please call your pharmacy.

## 2017-07-31 NOTE — Addendum Note (Signed)
Addended by: Drue Novel I on: 07/31/2017 03:46 PM   Modules accepted: Orders

## 2017-07-31 NOTE — Progress Notes (Signed)
Cardiology Office Note:    Date:  07/31/2017   ID:  Johnny Morrow, DOB 1920/01/12, MRN [id number]  PCP:  Wenda Low, MD  Cardiologist:  Fransico Him, MD   Referring MD: Wenda Low, MD   Chief Complaint  Patient presents with  . Follow-up    atrial flutter, HTN, CHF    History of Present Illness:    Johnny Morrow is a 83 y.o. male with a hx of atrial flutter,mild-moderate AS and HFpEF. Most recent echo 03/24/17 showed normal LVEF at 55%, tri leaflet AV with a mean gradient of 21 mmHg. There is no documented h/o CAD. He was hospitalized 06/2017 with atrial flutter with RVR and BB was added.  He was gelt not to be a candidate for anticoagulation or ablation due to advanced age and he was discharged home on Cardizem CD 360mg  daily and Lopressor 25mg  BID.  He also had CKD stage 4 and HTN.  He has asymptomatic bradycardia from BB and CCB.  He is here today for followup and is doing well.  He denies any chest pain or pressure, PND, orthopnea, LE edema, dizziness, palpitations or syncope. He has some mild DOE but he thinks that it is stable.  He is compliant with his meds and is tolerating meds with no SE.      Past Medical History:  Diagnosis Date  . Anemia   . Arthritis   . Cataract    "ready to come out on both sides; anytime" (06/16/2017)  . Chronic diastolic (congestive) heart failure ([place])   . CKD (chronic kidney disease), stage III ([place])    "followed by Dr. Keturah Barre @ [facility]; CKD fluctuates between III and IV" (06/16/2017)  . DVT (deep venous thrombosis) ([facility])    LLE  . History of blood transfusion ~ 01/2017; 05/2017   "blood loss; anemia"  . Hypertension   . Permanent atrial fibrillation ([facility])    atrial fibrillation /atrial flutter - not a candidate for anticoagulation due to advanced age  . Pneumonia 12/2016   "a touch"  . Prostate cancer [facility])     follows with hematologists q 6 months. S/p radical prostatectomy and proton therapy in 2001.  . Radiation cystitis   . Retinal  hemorrhage of both eyes    follows with ophthalmologists; "never had OR" (06/16/2017)  . Retroperitoneal bleed ?12/2016   [name] 06/15/2017    Past Surgical History:  Procedure Laterality Date  . FRACTURE SURGERY    . INGUINAL HERNIA REPAIR    . INTRAMEDULLARY (IM) NAIL INTERTROCHANTERIC Right 06/07/2017   Procedure: INTRAMEDULLARY (IM) NAIL INTERTROCHANTRIC;  Surgeon: [name], MD;  Location: [facility];  Service: Orthopedics;  Laterality: Right;  . PROSTATECTOMY    . TIBIA FRACTURE SURGERY Left 06/2012  . TONSILLECTOMY      Current Medications: Current Meds  Medication Sig  . albuterol (PROVENTIL HFA;VENTOLIN HFA) 108 (90 Base) MCG/ACT inhaler Inhale 2 puffs into the lungs every 6 (six) hours as needed for wheezing or shortness of breath.  Marland Kitchen aspirin EC 81 MG tablet Take 1 tablet (81 mg total) by mouth 2 (two) times daily.  . B Complex Vitamins (VITAMIN-B COMPLEX PO) Take 1 tablet by mouth daily.   . bisacodyl (DULCOLAX) 5 MG EC tablet Take 1 tablet (5 mg total) by mouth daily as needed for moderate constipation.  . Cholecalciferol (VITAMIN D3) 5000 units TABS Take 5,000 Units by mouth daily.   . Coenzyme Q10 (CO Q 10) 100 MG CAPS Take 100 mg by mouth  daily.  . diltiazem (CARDIZEM CD) 360 MG 24 hr capsule Take 1 capsule (360 mg total) by mouth daily.  Marland Kitchen doxazosin (CARDURA) 1 MG tablet Take 1 mg by mouth every evening.  . ferrous sulfate 325 (65 FE) MG tablet Take 1 tablet (325 mg total) by mouth daily with breakfast.  . isosorbide mononitrate (IMDUR) 60 MG 24 hr tablet Take 0.5 tablets (30 mg total) daily by mouth.  . levalbuterol (XOPENEX) 0.63 MG/3ML nebulizer solution Take 0.63 mg by nebulization at bedtime as needed for wheezing or shortness of breath.  . Melatonin 3 MG TABS Take 10 mg by mouth at bedtime.   . metoprolol tartrate (LOPRESSOR) 25 MG tablet Take 0.5 tablets (12.5 mg total) by mouth 2 (two) times daily.  . Multiple Vitamin (MULTI VITAMIN PO) Take 1 tablet by mouth  daily.   . Omega 3 1000 MG CAPS Take 2,000 mg by mouth daily.  . polyethylene glycol (MIRALAX / GLYCOLAX) packet Take 17 g by mouth daily. (Patient taking differently: Take 34 g by mouth daily. )  . potassium chloride 20 MEQ TBCR Take 20 mEq by mouth daily.  . Probiotic Product (PROBIOTIC PO) Chew 1 tablet (250 million cell) by mouth once a day  . senna (SENOKOT) 8.6 MG TABS tablet Take 1 tablet by mouth every morning.  . traMADol (ULTRAM) 50 MG tablet Take 1 tablet (50 mg total) every 6 (six) hours as needed by mouth for moderate pain.     Allergies:   Iodinated diagnostic agents; Ace inhibitors; Shellfish-derived products; and Penicillins   Social History   Socioeconomic History  . Marital status: Married    Spouse name: None  . Number of children: None  . Years of education: None  . Highest education level: None  Social Needs  . Financial resource strain: None  . Food insecurity - worry: None  . Food insecurity - inability: None  . Transportation needs - medical: None  . Transportation needs - non-medical: None  Occupational History  . None  Tobacco Use  . Smoking status: Former Smoker    Years: 10.00    Types: Cigarettes    Last attempt to quit: 1947    Years since quitting: 72.0  . Smokeless tobacco: Never Used  Substance and Sexual Activity  . Alcohol use: Yes    Alcohol/week: 0.6 oz    Types: 1 Glasses of wine per week  . Drug use: No  . Sexual activity: No  Other Topics Concern  . None  Social History Narrative  . None     Family History: The patient's family history includes COPD in his brother; Cancer in his father; Diabetes in his mother; Heart attack in his brother and father.  ROS:   Please see the history of present illness.    ROS  All other systems reviewed and negative.   EKGs/Labs/Other Studies Reviewed:    The following studies were reviewed today: none  EKG:  EKG is  ordered today.  The ekg ordered today demonstrates atrial flutter at  71bpm with RBBB  Recent Labs: 12/22/2016: Pro B Natriuretic peptide (BNP) 999.0 06/07/2017: ALT 18 06/16/2017: TSH 4.056 07/19/2017: B Natriuretic Peptide 2,324.6 07/25/2017: BUN 95; Creatinine, Ser 4.37; Hemoglobin 9.1; Platelets 92; Potassium 4.2; Sodium 136   Recent Lipid Panel    Component Value Date/Time   CHOL 211 (A) 07/29/2016   TRIG 76 07/29/2016   HDL 99 (A) 07/29/2016   LDLCALC 97 07/29/2016    Physical Exam:  VS:  BP 110/68   Pulse 71   Ht 5\' 5"  (1.651 m)   Wt 130 lb 9.6 oz (59.2 kg)   BMI 21.73 kg/m     Wt Readings from Last 3 Encounters:  07/31/17 130 lb 9.6 oz (59.2 kg)  07/25/17 154 lb 1.6 oz (69.9 kg)  07/07/17 137 lb (62.1 kg)     GEN:  Well nourished, well developed in no acute distress HEENT: Normal NECK: No JVD; No carotid bruits LYMPHATICS: No lymphadenopathy CARDIAC: RRR, no murmurs, rubs, gallops RESPIRATORY:  Clear to auscultation without rales, wheezing or rhonchi  ABDOMEN: Soft, non-tender, non-distended MUSCULOSKELETAL:  No edema; No deformity  SKIN: Warm and dry NEUROLOGIC:  Alert and oriented x 3 PSYCHIATRIC:  Normal affect   ASSESSMENT:    1. AF (paroxysmal atrial fibrillation) ([place])   2. Mild aortic stenosis   3. Essential hypertension, benign   4. Chronic diastolic CHF (congestive heart failure) ([place])   5. Pure hypercholesterolemia    PLAN:    In order of problems listed above:  1.  Permanent atrial fibrillation/atrial flutter with recent hospitalization for RVR - his rate is fairly well controlled on BB and CCB.  He is not a candidate for ablation or anticoagulation due to advanced age.  I will get a 24 hour Holter monitor to assess for adequate rate control.    2.  Mild AS by echo 2017.   3.  HTN - BP is well controlled on exam today.He will continue on Cardizem CD 360mg  daily, Cardura 1mg  daily, lopressor 12.5mg  BID.  4.  Chronic diastolic CHF - he appears euvolemic on exam today.  He is not on diuretics.   5.   Hyperlipidemia  - followed by PCP   Medication Adjustments/Labs and Tests Ordered: Current medicines are reviewed at length with the patient today.  Concerns regarding medicines are outlined above.  No orders of the defined types were placed in this encounter.  No orders of the defined types were placed in this encounter.   Signed, Fransico Him, MD  07/31/2017 3:34 PM    [place]

## 2017-08-02 DIAGNOSIS — I4892 Unspecified atrial flutter: Secondary | ICD-10-CM | POA: Diagnosis not present

## 2017-08-03 DIAGNOSIS — I130 Hypertensive heart and chronic kidney disease with heart failure and stage 1 through stage 4 chronic kidney disease, or unspecified chronic kidney disease: Secondary | ICD-10-CM | POA: Diagnosis not present

## 2017-08-03 DIAGNOSIS — J189 Pneumonia, unspecified organism: Secondary | ICD-10-CM | POA: Diagnosis not present

## 2017-08-04 DIAGNOSIS — N184 Chronic kidney disease, stage 4 (severe): Secondary | ICD-10-CM | POA: Diagnosis not present

## 2017-08-04 DIAGNOSIS — I130 Hypertensive heart and chronic kidney disease with heart failure and stage 1 through stage 4 chronic kidney disease, or unspecified chronic kidney disease: Secondary | ICD-10-CM | POA: Diagnosis not present

## 2017-08-04 DIAGNOSIS — J189 Pneumonia, unspecified organism: Secondary | ICD-10-CM | POA: Diagnosis not present

## 2017-08-04 DIAGNOSIS — I10 Essential (primary) hypertension: Secondary | ICD-10-CM | POA: Diagnosis not present

## 2017-08-05 DIAGNOSIS — J189 Pneumonia, unspecified organism: Secondary | ICD-10-CM | POA: Diagnosis not present

## 2017-08-05 DIAGNOSIS — I130 Hypertensive heart and chronic kidney disease with heart failure and stage 1 through stage 4 chronic kidney disease, or unspecified chronic kidney disease: Secondary | ICD-10-CM | POA: Diagnosis not present

## 2017-08-06 ENCOUNTER — Telehealth: Payer: Self-pay

## 2017-08-06 DIAGNOSIS — I130 Hypertensive heart and chronic kidney disease with heart failure and stage 1 through stage 4 chronic kidney disease, or unspecified chronic kidney disease: Secondary | ICD-10-CM | POA: Diagnosis not present

## 2017-08-06 DIAGNOSIS — J189 Pneumonia, unspecified organism: Secondary | ICD-10-CM | POA: Diagnosis not present

## 2017-08-06 NOTE — Telephone Encounter (Signed)
Received a letter from Miguel Dibble, RN at [facility] care stating that furosemide 40 mg BID was changed from 80mg  on 07/30/17 by his PCP.   Updated information in chart and will give information to Dr. Radford Pax to review.

## 2017-08-07 ENCOUNTER — Telehealth: Payer: Self-pay | Admitting: Cardiology

## 2017-08-07 DIAGNOSIS — I130 Hypertensive heart and chronic kidney disease with heart failure and stage 1 through stage 4 chronic kidney disease, or unspecified chronic kidney disease: Secondary | ICD-10-CM | POA: Diagnosis not present

## 2017-08-07 DIAGNOSIS — J189 Pneumonia, unspecified organism: Secondary | ICD-10-CM | POA: Diagnosis not present

## 2017-08-07 NOTE — Telephone Encounter (Signed)
Patient calling and is audibly SOB and cannot get out but 2 words at a time. Requested to speak to patient's wife since I am unable to understand the patient. Wife states that the patient's BP has been fluctuating systolics 83-254 and diastolics 98-264, with HR 55-100. She states that the patient's SOB at rest has worsened. Patient has permanent Afib/flutter and per Dr. Theodosia Blender note from 12/14 he is not a candidate of ablation or anticoagulation due to advanced age. Patient recently wore 24-hr holter 12/14 where the patient had adequate HR control on current meds. Wife states that he recently completed abx for pneumonia. She states that the patient is actually taking lasix 80 mg BID. She states that his kidney MD changed this last Friday. She states that she is taking his other meds as directed. Made the wife aware that I can hear the patient in the background audibly wheezing and that he should be seen in the ER. Made her aware that he may still have pneumonia or that he made need IV diuretics. Patient is adamantly refusing to go to the ER. Wife states that the Home RN is coming to the house tomorrow. She states that her husband is refusing to go the ER but if he gets worse she will make him go.

## 2017-08-07 NOTE — Telephone Encounter (Signed)
Pt c/o BP issue: STAT if pt c/o blurred vision, one-sided weakness or slurred speech  1. What are your last 5 BP readings?  12/19   144/100 109 hr  8:30a    12/20 129/82  54hr 8am  2. Are you having any other symptoms (ex. Dizziness, headache, blurred vision, passed out)? SOB  3. What is your BP issue? Going up and down.

## 2017-08-08 ENCOUNTER — Encounter (HOSPITAL_COMMUNITY): Payer: Self-pay

## 2017-08-08 ENCOUNTER — Other Ambulatory Visit: Payer: Self-pay

## 2017-08-08 ENCOUNTER — Emergency Department (HOSPITAL_COMMUNITY): Payer: Medicare Other

## 2017-08-08 ENCOUNTER — Inpatient Hospital Stay (HOSPITAL_COMMUNITY)
Admission: EM | Admit: 2017-08-08 | Discharge: 2017-08-16 | DRG: 193 | Disposition: A | Discharge status: Hospice | Payer: Medicare Other | Attending: Family Medicine | Admitting: Family Medicine

## 2017-08-08 DIAGNOSIS — J189 Pneumonia, unspecified organism: Secondary | ICD-10-CM | POA: Diagnosis not present

## 2017-08-08 DIAGNOSIS — Z8546 Personal history of malignant neoplasm of prostate: Secondary | ICD-10-CM

## 2017-08-08 DIAGNOSIS — I482 Chronic atrial fibrillation: Secondary | ICD-10-CM | POA: Diagnosis present

## 2017-08-08 DIAGNOSIS — I5032 Chronic diastolic (congestive) heart failure: Secondary | ICD-10-CM | POA: Diagnosis not present

## 2017-08-08 DIAGNOSIS — R627 Adult failure to thrive: Secondary | ICD-10-CM | POA: Diagnosis present

## 2017-08-08 DIAGNOSIS — N184 Chronic kidney disease, stage 4 (severe): Secondary | ICD-10-CM

## 2017-08-08 DIAGNOSIS — E871 Hypo-osmolality and hyponatremia: Secondary | ICD-10-CM | POA: Diagnosis not present

## 2017-08-08 DIAGNOSIS — Z8701 Personal history of pneumonia (recurrent): Secondary | ICD-10-CM

## 2017-08-08 DIAGNOSIS — I4892 Unspecified atrial flutter: Secondary | ICD-10-CM | POA: Diagnosis not present

## 2017-08-08 DIAGNOSIS — Z7189 Other specified counseling: Secondary | ICD-10-CM

## 2017-08-08 DIAGNOSIS — J90 Pleural effusion, not elsewhere classified: Secondary | ICD-10-CM | POA: Diagnosis not present

## 2017-08-08 DIAGNOSIS — M1990 Unspecified osteoarthritis, unspecified site: Secondary | ICD-10-CM | POA: Diagnosis present

## 2017-08-08 DIAGNOSIS — Z09 Encounter for follow-up examination after completed treatment for conditions other than malignant neoplasm: Secondary | ICD-10-CM | POA: Diagnosis not present

## 2017-08-08 DIAGNOSIS — R7989 Other specified abnormal findings of blood chemistry: Secondary | ICD-10-CM | POA: Diagnosis not present

## 2017-08-08 DIAGNOSIS — J690 Pneumonitis due to inhalation of food and vomit: Secondary | ICD-10-CM | POA: Diagnosis present

## 2017-08-08 DIAGNOSIS — D638 Anemia in other chronic diseases classified elsewhere: Secondary | ICD-10-CM | POA: Diagnosis not present

## 2017-08-08 DIAGNOSIS — N185 Chronic kidney disease, stage 5: Secondary | ICD-10-CM | POA: Diagnosis not present

## 2017-08-08 DIAGNOSIS — D500 Iron deficiency anemia secondary to blood loss (chronic): Secondary | ICD-10-CM | POA: Diagnosis present

## 2017-08-08 DIAGNOSIS — Y95 Nosocomial condition: Secondary | ICD-10-CM | POA: Diagnosis present

## 2017-08-08 DIAGNOSIS — J470 Bronchiectasis with acute lower respiratory infection: Secondary | ICD-10-CM | POA: Diagnosis present

## 2017-08-08 DIAGNOSIS — I5030 Unspecified diastolic (congestive) heart failure: Secondary | ICD-10-CM | POA: Diagnosis not present

## 2017-08-08 DIAGNOSIS — I361 Nonrheumatic tricuspid (valve) insufficiency: Secondary | ICD-10-CM | POA: Diagnosis not present

## 2017-08-08 DIAGNOSIS — Z888 Allergy status to other drugs, medicaments and biological substances status: Secondary | ICD-10-CM

## 2017-08-08 DIAGNOSIS — Z91041 Radiographic dye allergy status: Secondary | ICD-10-CM

## 2017-08-08 DIAGNOSIS — Z515 Encounter for palliative care: Secondary | ICD-10-CM | POA: Diagnosis not present

## 2017-08-08 DIAGNOSIS — Z91013 Allergy to seafood: Secondary | ICD-10-CM

## 2017-08-08 DIAGNOSIS — J9601 Acute respiratory failure with hypoxia: Secondary | ICD-10-CM | POA: Diagnosis present

## 2017-08-08 DIAGNOSIS — I272 Pulmonary hypertension, unspecified: Secondary | ICD-10-CM | POA: Diagnosis present

## 2017-08-08 DIAGNOSIS — Z7982 Long term (current) use of aspirin: Secondary | ICD-10-CM

## 2017-08-08 DIAGNOSIS — Z9889 Other specified postprocedural states: Secondary | ICD-10-CM

## 2017-08-08 DIAGNOSIS — I25810 Atherosclerosis of coronary artery bypass graft(s) without angina pectoris: Secondary | ICD-10-CM | POA: Diagnosis not present

## 2017-08-08 DIAGNOSIS — J849 Interstitial pulmonary disease, unspecified: Secondary | ICD-10-CM | POA: Diagnosis present

## 2017-08-08 DIAGNOSIS — R0602 Shortness of breath: Secondary | ICD-10-CM | POA: Diagnosis not present

## 2017-08-08 DIAGNOSIS — Z66 Do not resuscitate: Secondary | ICD-10-CM | POA: Diagnosis not present

## 2017-08-08 DIAGNOSIS — R918 Other nonspecific abnormal finding of lung field: Secondary | ICD-10-CM | POA: Diagnosis not present

## 2017-08-08 DIAGNOSIS — R069 Unspecified abnormalities of breathing: Secondary | ICD-10-CM | POA: Diagnosis not present

## 2017-08-08 DIAGNOSIS — I132 Hypertensive heart and chronic kidney disease with heart failure and with stage 5 chronic kidney disease, or end stage renal disease: Secondary | ICD-10-CM | POA: Diagnosis not present

## 2017-08-08 DIAGNOSIS — I519 Heart disease, unspecified: Secondary | ICD-10-CM | POA: Diagnosis not present

## 2017-08-08 DIAGNOSIS — I350 Nonrheumatic aortic (valve) stenosis: Secondary | ICD-10-CM | POA: Diagnosis present

## 2017-08-08 DIAGNOSIS — R0603 Acute respiratory distress: Secondary | ICD-10-CM | POA: Diagnosis not present

## 2017-08-08 DIAGNOSIS — Z880 Allergy status to penicillin: Secondary | ICD-10-CM

## 2017-08-08 DIAGNOSIS — I5033 Acute on chronic diastolic (congestive) heart failure: Secondary | ICD-10-CM | POA: Diagnosis not present

## 2017-08-08 DIAGNOSIS — I10 Essential (primary) hypertension: Secondary | ICD-10-CM | POA: Diagnosis not present

## 2017-08-08 DIAGNOSIS — N179 Acute kidney failure, unspecified: Secondary | ICD-10-CM | POA: Diagnosis not present

## 2017-08-08 DIAGNOSIS — R630 Anorexia: Secondary | ICD-10-CM | POA: Diagnosis present

## 2017-08-08 DIAGNOSIS — D696 Thrombocytopenia, unspecified: Secondary | ICD-10-CM | POA: Diagnosis present

## 2017-08-08 DIAGNOSIS — H269 Unspecified cataract: Secondary | ICD-10-CM | POA: Diagnosis present

## 2017-08-08 DIAGNOSIS — R0600 Dyspnea, unspecified: Secondary | ICD-10-CM

## 2017-08-08 DIAGNOSIS — Z008 Encounter for other general examination: Secondary | ICD-10-CM | POA: Diagnosis not present

## 2017-08-08 DIAGNOSIS — Z87891 Personal history of nicotine dependence: Secondary | ICD-10-CM | POA: Diagnosis not present

## 2017-08-08 DIAGNOSIS — Z86718 Personal history of other venous thrombosis and embolism: Secondary | ICD-10-CM

## 2017-08-08 DIAGNOSIS — I4891 Unspecified atrial fibrillation: Secondary | ICD-10-CM | POA: Diagnosis not present

## 2017-08-08 DIAGNOSIS — I5031 Acute diastolic (congestive) heart failure: Secondary | ICD-10-CM | POA: Diagnosis not present

## 2017-08-08 DIAGNOSIS — I371 Nonrheumatic pulmonary valve insufficiency: Secondary | ICD-10-CM | POA: Diagnosis not present

## 2017-08-08 DIAGNOSIS — J479 Bronchiectasis, uncomplicated: Secondary | ICD-10-CM | POA: Diagnosis not present

## 2017-08-08 DIAGNOSIS — E785 Hyperlipidemia, unspecified: Secondary | ICD-10-CM | POA: Diagnosis not present

## 2017-08-08 DIAGNOSIS — R54 Age-related physical debility: Secondary | ICD-10-CM | POA: Diagnosis not present

## 2017-08-08 DIAGNOSIS — N19 Unspecified kidney failure: Secondary | ICD-10-CM | POA: Diagnosis not present

## 2017-08-08 LAB — COMPREHENSIVE METABOLIC PANEL
ALK PHOS: 146 U/L — AB (ref 38–126)
ALT: 42 U/L (ref 17–63)
AST: 61 U/L — AB (ref 15–41)
Albumin: 3.2 g/dL — ABNORMAL LOW (ref 3.5–5.0)
Anion gap: 16 — ABNORMAL HIGH (ref 5–15)
BILIRUBIN TOTAL: 0.9 mg/dL (ref 0.3–1.2)
BUN: 134 mg/dL — AB (ref 6–20)
CALCIUM: 8.8 mg/dL — AB (ref 8.9–10.3)
CO2: 21 mmol/L — ABNORMAL LOW (ref 22–32)
CREATININE: 6.7 mg/dL — AB (ref 0.61–1.24)
Chloride: 95 mmol/L — ABNORMAL LOW (ref 101–111)
GFR calc Af Amer: 7 mL/min — ABNORMAL LOW (ref >60)
GFR, EST NON AFRICAN AMERICAN: 6 mL/min — AB (ref >60)
Glucose, Bld: 149 mg/dL — ABNORMAL HIGH (ref 65–99)
Potassium: 5.5 mmol/L — ABNORMAL HIGH (ref 3.5–5.1)
Sodium: 132 mmol/L — ABNORMAL LOW (ref 135–145)
TOTAL PROTEIN: 6.5 g/dL (ref 6.5–8.1)

## 2017-08-08 LAB — CBC WITH DIFFERENTIAL/PLATELET
BASOS PCT: 0 %
Basophils Absolute: 0 10*3/uL (ref 0.0–0.1)
EOS PCT: 0 %
Eosinophils Absolute: 0 10*3/uL (ref 0.0–0.7)
HEMATOCRIT: 29.9 % — AB (ref 39.0–52.0)
Hemoglobin: 10.3 g/dL — ABNORMAL LOW (ref 13.0–17.0)
Lymphocytes Relative: 29 %
Lymphs Abs: 1.6 10*3/uL (ref 0.7–4.0)
MCH: 28.2 pg (ref 26.0–34.0)
MCHC: 34.4 g/dL (ref 30.0–36.0)
MCV: 81.9 fL (ref 78.0–100.0)
MONOS PCT: 10 %
Monocytes Absolute: 0.6 10*3/uL (ref 0.1–1.0)
NEUTROS PCT: 61 %
Neutro Abs: 3.4 10*3/uL (ref 1.7–7.7)
Platelets: 79 10*3/uL — ABNORMAL LOW (ref 150–400)
RBC: 3.65 MIL/uL — AB (ref 4.22–5.81)
RDW: 15.9 % — ABNORMAL HIGH (ref 11.5–15.5)
WBC: 5.6 10*3/uL (ref 4.0–10.5)

## 2017-08-08 LAB — I-STAT TROPONIN, ED: TROPONIN I, POC: 0.06 ng/mL (ref 0.00–0.08)

## 2017-08-08 LAB — LACTIC ACID, PLASMA: Lactic Acid, Venous: 3.6 mmol/L (ref 0.5–1.9)

## 2017-08-08 LAB — I-STAT CG4 LACTIC ACID, ED: Lactic Acid, Venous: 2.01 mmol/L (ref 0.5–1.9)

## 2017-08-08 LAB — BRAIN NATRIURETIC PEPTIDE: B NATRIURETIC PEPTIDE 5: 749.9 pg/mL — AB (ref 0.0–100.0)

## 2017-08-08 MED ORDER — ORAL CARE MOUTH RINSE
15.0000 mL | Freq: Two times a day (BID) | OROMUCOSAL | Status: DC
  Administered 2017-08-09 – 2017-08-16 (×14): 15 mL via OROMUCOSAL

## 2017-08-08 MED ORDER — IPRATROPIUM-ALBUTEROL 0.5-2.5 (3) MG/3ML IN SOLN
3.0000 mL | Freq: Four times a day (QID) | RESPIRATORY_TRACT | Status: DC
  Administered 2017-08-08 – 2017-08-10 (×9): 3 mL via RESPIRATORY_TRACT
  Filled 2017-08-08 (×9): qty 3

## 2017-08-08 MED ORDER — FUROSEMIDE 10 MG/ML IJ SOLN
40.0000 mg | Freq: Once | INTRAMUSCULAR | Status: AC
  Administered 2017-08-08: 40 mg via INTRAVENOUS
  Filled 2017-08-08: qty 4

## 2017-08-08 MED ORDER — SODIUM CHLORIDE 0.9 % IV BOLUS (SEPSIS)
500.0000 mL | Freq: Once | INTRAVENOUS | Status: AC
  Administered 2017-08-08: 500 mL via INTRAVENOUS

## 2017-08-08 MED ORDER — ALBUTEROL (5 MG/ML) CONTINUOUS INHALATION SOLN
10.0000 mg/h | INHALATION_SOLUTION | Freq: Once | RESPIRATORY_TRACT | Status: AC
  Administered 2017-08-08: 10 mg/h via RESPIRATORY_TRACT
  Filled 2017-08-08: qty 20

## 2017-08-08 MED ORDER — CEFEPIME HCL 2 G IJ SOLR
2.0000 g | Freq: Once | INTRAMUSCULAR | Status: AC
  Administered 2017-08-08: 2 g via INTRAVENOUS
  Filled 2017-08-08: qty 2

## 2017-08-08 MED ORDER — VANCOMYCIN HCL IN DEXTROSE 1-5 GM/200ML-% IV SOLN
1000.0000 mg | Freq: Once | INTRAVENOUS | Status: AC
  Administered 2017-08-08: 1000 mg via INTRAVENOUS
  Filled 2017-08-08: qty 200

## 2017-08-08 NOTE — ED Notes (Signed)
Bed: OQ94 Expected date: 08/08/17 Expected time: 8:34 AM Means of arrival: Ambulance Comments: [facility]

## 2017-08-08 NOTE — Progress Notes (Signed)
A consult was received from an ED physician for Cefepime & Vancomycin per pharmacy dosing.  The patient's profile has been reviewed for ht/wt/allergies/indication/available labs.   PCN allergy noted, but patient has received cephalosporins in the past. Also hx CKD with baseline Scr ~4.3. A one time order has been placed for Cefepime 2gm IV & Vanc 1gm IV.  Further antibiotics/pharmacy consults should be ordered by admitting physician if indicated.                       Thank you, Biagio Borg 08/08/2017  10:37 AM

## 2017-08-08 NOTE — ED Notes (Signed)
Date and time results received: 08/08/17  (use smartphrase ".now" to insert current time)  Test: Lactic Critical Value: 3.6  Name of Provider Notified: Jeneen Rinks, MD  Orders Received? Or Actions Taken?:

## 2017-08-08 NOTE — ED Notes (Signed)
Oxygen applied via nasal cannula--2 Liters.

## 2017-08-08 NOTE — ED Triage Notes (Signed)
Transported by [facility] from [facility]. Pt reports SHOB/ productive cough and reports a recent diagnosis of pneumonia. EMS administered a nebulizer treatment and 125 mg of Solu-Medrol IV PTA.

## 2017-08-08 NOTE — ED Notes (Signed)
Attempted to call report charge request 5 minute then return call.

## 2017-08-08 NOTE — H&P (Signed)
Patient is alert and oriented x 4. He confirms DNR code status.

## 2017-08-08 NOTE — H&P (Signed)
History and Physical  Johnny Morrow IEP:[id number] DOB: 1920-06-11 DOA: 08/08/2017  Referring physician: Dr. Jeneen Rinks MArk PCP: Wenda Low, MD  Outpatient Specialists: None Patient coming from: SNF  Chief Complaint: Shortness of breath   HPI: Johnny Morrow is a 83 y.o. male with medical history significant for chronic heart failure with preserved EF 65-70% (2017), CKD stage IV, chronic normocytic anemia, chronic a-fib not anticoagulated due to prior retroperitoneal hemorrhage, recent HCAP completed antibiotics 07/28/17 presents with complaints of progressively worsening dyspnea. He is alert and oriented x 3. Reports not on O2 supplement in the SNF. Admits to productive cough and dyspnea on exertion. Denies chest pain, chills, or fever.  ED Course: CXR 08/08/17 revealed right pleural effusion and increase in pulmonary vascularity, elevated lactic acid and hypoxia with O2 sat 87% on RA. IV antibiotics started empirically for HCAP IV cefepime and IV vancomycin day #0.  Review of Systems: Review of systems as described in HPI otherwise negative   Past Medical History:  Diagnosis Date  . Anemia   . Arthritis   . Cataract    "ready to come out on both sides; anytime" (06/16/2017)  . Chronic diastolic (congestive) heart failure ([place])   . CKD (chronic kidney disease), stage III ([place])    "followed by Dr. Keturah Barre @ [place]; CKD fluctuates between III and IV" (06/16/2017)  . DVT (deep venous thrombosis) (HCC)    LLE  . History of blood transfusion ~ 01/2017; 05/2017   "blood loss; anemia"  . Hypertension   . Permanent atrial fibrillation (HCC)    atrial fibrillation /atrial flutter - not a candidate for anticoagulation due to advanced age  . Pneumonia 12/2016   "a touch"  . Prostate cancer [facility])     follows with hematologists q 6 months. S/p radical prostatectomy and proton therapy in 2001.  . Radiation cystitis   . Retinal hemorrhage of both eyes    follows with ophthalmologists; "never had OR"  (06/16/2017)  . Retroperitoneal bleed ?12/2016   [name] 06/15/2017   Past Surgical History:  Procedure Laterality Date  . FRACTURE SURGERY    . INGUINAL HERNIA REPAIR    . INTRAMEDULLARY (IM) NAIL INTERTROCHANTERIC Right 06/07/2017   Procedure: INTRAMEDULLARY (IM) NAIL INTERTROCHANTRIC;  Surgeon: [name], MD;  Location: [place];  Service: Orthopedics;  Laterality: Right;  . PROSTATECTOMY    . TIBIA FRACTURE SURGERY Left 06/2012  . TONSILLECTOMY      Social History:  reports that he quit smoking about 72 years ago. His smoking use included cigarettes. He quit after 10.00 years of use. he has never used smokeless tobacco. He reports that he drinks about 0.6 oz of alcohol per week. He reports that he does not use drugs.   Allergies  Allergen Reactions  . Iodinated Diagnostic Agents Rash  . Ace Inhibitors Other (See Comments)    Noted (on MAR) to be "allergic," but no reaction was cited/noted  . Shellfish-Derived Products Other (See Comments)    Noted (on MAR) to be "allergic," but no reaction was cited/noted  . Penicillins Rash    Has patient had a PCN reaction causing immediate rash, facial/tongue/throat swelling, SOB or lightheadedness with hypotension: Yes Has patient had a PCN reaction causing severe rash involving mucus membranes or skin necrosis: Unknown Has patient had a PCN reaction that required hospitalization: Unknown Has patient had a PCN reaction occurring within the last 10 years: Unknown If all of the above answers are "NO", then may proceed with Cephalosporin use.  Family History  Problem Relation Age of Onset  . COPD Brother   . Heart attack Brother   . Cancer Father   . Heart attack Father   . Diabetes Mother      Prior to Admission medications   Medication Sig Start Date End Date Taking? Authorizing Provider  albuterol (PROVENTIL HFA;VENTOLIN HFA) 108 (90 Base) MCG/ACT inhaler Inhale 2 puffs into the lungs every 6 (six) hours as needed for wheezing or  shortness of breath. 08/20/16  Yes Martinique, Betty G, MD  aspirin EC 81 MG tablet Take 1 tablet (81 mg total) by mouth 2 (two) times daily. 06/08/17  Yes Corky Sing, PA-C  B Complex Vitamins (VITAMIN-B COMPLEX PO) Take 1 tablet by mouth daily.    Yes [provider]  bisacodyl (DULCOLAX) 5 MG EC tablet Take 1 tablet (5 mg total) by mouth daily as needed for moderate constipation. 06/12/17  Yes Nita Sells, MD  Cholecalciferol (VITAMIN D3) 5000 units TABS Take 5,000 Units by mouth daily.    Yes [provider]  Coenzyme Q10 (CO Q 10) 100 MG CAPS Take 100 mg by mouth daily.   Yes [provider]  diltiazem (CARDIZEM CD) 360 MG 24 hr capsule Take 1 capsule (360 mg total) by mouth daily. 07/26/17 08/25/17 Yes Dessa Phi, DO  doxazosin (CARDURA) 1 MG tablet Take 1 mg by mouth every evening.   Yes [provider]  ferrous sulfate 325 (65 FE) MG tablet Take 1 tablet (325 mg total) by mouth daily with breakfast. 12/22/16  Yes [place], Betty G, MD  furosemide (LASIX) 80 MG tablet Take 80 mg by mouth 2 (two) times daily.   Yes [provider]  isosorbide mononitrate (IMDUR) 60 MG 24 hr tablet Take 0.5 tablets (30 mg total) daily by mouth. 06/22/17  Yes Caren Griffins, MD  Melatonin 3 MG TABS Take 10 mg by mouth at bedtime.    Yes [provider]  metoprolol tartrate (LOPRESSOR) 25 MG tablet Take 0.5 tablets (12.5 mg total) by mouth 2 (two) times daily. 07/26/17 08/25/17 Yes Dessa Phi, DO  Multiple Vitamin (MULTI VITAMIN PO) Take 1 tablet by mouth daily.    Yes [provider]  Omega 3 1000 MG CAPS Take 2,000 mg by mouth daily.   Yes [provider]  polyethylene glycol (MIRALAX / GLYCOLAX) packet Take 17 g by mouth daily. Patient taking differently: Take 34 g by mouth daily.  06/12/17  Yes Nita Sells, MD  potassium chloride 20 MEQ TBCR Take 20 mEq by mouth daily. 07/25/17  Yes Dessa Phi, DO  Probiotic Product  (PROBIOTIC PO) Chew 1 tablet (250 million cell) by mouth once a day   Yes [provider]  senna (SENOKOT) 8.6 MG TABS tablet Take 1 tablet by mouth every morning.   Yes [provider]  traMADol (ULTRAM) 50 MG tablet Take 1 tablet (50 mg total) every 6 (six) hours as needed by mouth for moderate pain. 06/22/17  Yes Caren Griffins, MD    Physical Exam: BP 126/75   Pulse 85   Temp (!) 97.5 F (36.4 C) (Oral)   Resp (!) 22   SpO2 98%   General:  83 yo AAM thin built on [place]. Alert and oriented x 3 Eyes: pupils are round and reactive to light  ENT: Mucous membrane are moist no erythema or exudates Neck: No JVD or thyromegaly  Cardiovascular: IRRR no rubs or gallops Respiratory: Diffused rhonchi bilaterally. No wheezes  noted. Abdomen: soft NT ND NBS x4 Skin: No noted open lesions Musculoskeletal: Non focal. No LE edema Psychiatric: Mood is appropriate for condition and setting Neurologic: Alert and oriented x 3          Labs on Admission:  Basic Metabolic Panel: Recent Labs  Lab 08/08/17 0911  NA 132*  K 5.5*  CL 95*  CO2 21*  GLUCOSE 149*  BUN 134*  CREATININE 6.70*  CALCIUM 8.8*   Liver Function Tests: Recent Labs  Lab 08/08/17 0911  AST 61*  ALT 42  ALKPHOS 146*  BILITOT 0.9  PROT 6.5  ALBUMIN 3.2*   No results for input(s): LIPASE, AMYLASE in the last 168 hours. No results for input(s): AMMONIA in the last 168 hours. CBC: Recent Labs  Lab 08/08/17 0911  WBC 5.6  NEUTROABS 3.4  HGB 10.3*  HCT 29.9*  MCV 81.9  PLT 79*   Cardiac Enzymes: No results for input(s): CKTOTAL, CKMB, CKMBINDEX, TROPONINI in the last 168 hours.  BNP (last 3 results) Recent Labs    06/07/17 1022 07/19/17 1828 08/08/17 0911  BNP 409.3* 2,324.6* 749.9*    ProBNP (last 3 results) Recent Labs    08/20/16 1221 12/22/16 1453  PROBNP 373.0* 999.0*    CBG: No results for input(s): GLUCAP in the last 168 hours.  Radiological Exams on Admission: Dg  Chest 2 View  Result Date: 08/08/2017 CLINICAL DATA:  Shortness of breath. EXAM: CHEST  2 VIEW COMPARISON:  Radiograph July 19, 2017. FINDINGS: Stable cardiomegaly and central pulmonary vascular congestion is noted. Atherosclerosis of thoracic aorta is noted. No pneumothorax is noted. Increased bibasilar opacities are noted concerning for pneumonia or edema. Probable mild right pleural effusion is noted. Degenerative changes seen involving the left glenohumeral joint. IMPRESSION: Stable cardiomegaly and central pulmonary vascular congestion. Increased bibasilar opacities are noted concerning for worsening edema or infiltrates, with probable mild right pleural effusion. Electronically Signed   By: Marijo Conception, M.D.   On: 08/08/2017 10:02    EKG: Independently reviewed. Atrial premature complex  Assessment/Plan Present on Admission: **None**  Active Problems:   * No active hospital problems. *   Acute hypoxic respiratory failure most likely 2/2 to HCAP, poa vs acute on chronic heart failure  -last echo in 2017 LV EF 65-70% -strict I&Os -BNP 700's -daily weights -O2 supplementation to maintain O2 saturation 92% or greater -lasix 40 mg IV once  HCAP, poa -management as stated above -sputum cx, blood cx x2 -duonebs -IV vancomycin and IV cefepime day #0  Acute on chronic heart failure with preserved EF 65% -management as stated above  AKI on CKD stage IV -baseline cr 4.5 -cr 6.70 -azotemia with BUN 134  -avoid nephrotoxic agents and hypotension  Azotemia 2/2 to worsening renal function -BUN 134 -cr 6.70 -No altered mental status -no overt bleeding -BMP am  Acute Hyponatremia most likely dilutional -fluid restriction -monitor urine output  -Na+ 132 -repeat BMP am  Chronic thrombocytopenia -plt 79 k -baseline plt 90 k -no sign of overt bleeding -cbc am  DVT prophylaxis: SCDs  Code Status: DNR per ED physician  Family Communication: No family member at  bedside  Disposition Plan: will stay another midnight to continue current management   Consults called: None  Admission status: Inpatient status    Kayleen Memos MD [facility] Pager [phone]  If 7PM-7AM, please contact night-coverage [email or web address] Password [facility]  08/08/2017, 3:17 PM

## 2017-08-08 NOTE — ED Notes (Signed)
Patient much more alert at this time and requesting meal tray. MD aware and confirmed that it was appropriate to feed patient.

## 2017-08-08 NOTE — ED Provider Notes (Signed)
[facility] DEPT Provider Note   CSN: [id number] Arrival date & time: 08/08/17  7026     History   Chief Complaint Chief Complaint  Patient presents with  . Shortness of Breath    HPI Johnny Morrow is a 83 y.o. male. CC: Shortness of breath.  HPI:  83 year old male from a health care facility with complaint of shortness of breath.  Patient has history of CHF, CK D stage III, permanent A. fib, not anticoagulated due to prior retro-peroneal hemorrhage, and recent healthcare associated pneumonia treated with admission and antibiotics December 2 through December 11.  Describes progressive shortness of breath starting last night. No fever. 87% on room air on arrival here. He is not on O2 chronically.  Past Medical History:  Diagnosis Date  . Anemia   . Arthritis   . Cataract    "ready to come out on both sides; anytime" (06/16/2017)  . Chronic diastolic (congestive) heart failure ([place])   . CKD (chronic kidney disease), stage III ([place])    "followed by Dr. Keturah Barre @ [place]; CKD fluctuates between III and IV" (06/16/2017)  . DVT (deep venous thrombosis) (HCC)    LLE  . History of blood transfusion ~ 01/2017; 05/2017   "blood loss; anemia"  . Hypertension   . Permanent atrial fibrillation (HCC)    atrial fibrillation /atrial flutter - not a candidate for anticoagulation due to advanced age  . Pneumonia 12/2016   "a touch"  . Prostate cancer [facility])     follows with hematologists q 6 months. S/p radical prostatectomy and proton therapy in 2001.  . Radiation cystitis   . Retinal hemorrhage of both eyes    follows with ophthalmologists; "never had OR" (06/16/2017)  . Retroperitoneal bleed ?12/2016   [name] 06/15/2017    Patient Active Problem List   Diagnosis Date Noted  . Atrial flutter ([place]) 07/31/2017  . HCAP (healthcare-associated pneumonia) 07/19/2017  . Pneumonia 07/19/2017  . Tachycardia 06/15/2017  . Retroperitoneal bleed 5/18 06/15/2017  .  Chronic diastolic CHF (congestive heart failure) ([place]) 06/15/2017  . Elevated troponin   . Closed intertrochanteric fracture of hip, right, initial encounter ([place]) 10 /18 06/07/2017  . AF (paroxysmal atrial fibrillation) ([place]) 12/22/2016  . Pulmonary nodule less than 6 cm determined by computed tomography of lung 11/27/2016  . Normocytic anemia 11/27/2016  . Chronic kidney disease (CKD), stage IV (severe) ([place]) 08/20/2016  . Hyperlipidemia 08/20/2016  . Moderate tricuspid regurgitation 01/11/2016  . Mild aortic stenosis 01/11/2016  . Mild aortic regurgitation 01/11/2016  . Osteoporosis 01/11/2016  . Prediabetes 12/19/2015  . Essential hypertension, benign 09/25/2015  . History of prostate cancer 09/25/2015  . Idiopathic scoliosis, thoracic 09/25/2015  . History of DVT of lower extremity, left 09/25/2015    Past Surgical History:  Procedure Laterality Date  . FRACTURE SURGERY    . INGUINAL HERNIA REPAIR    . INTRAMEDULLARY (IM) NAIL INTERTROCHANTERIC Right 06/07/2017   Procedure: INTRAMEDULLARY (IM) NAIL INTERTROCHANTRIC;  Surgeon: [name], MD;  Location: [place];  Service: Orthopedics;  Laterality: Right;  . PROSTATECTOMY    . TIBIA FRACTURE SURGERY Left 06/2012  . TONSILLECTOMY         Home Medications    Prior to Admission medications   Medication Sig Start Date End Date Taking? Authorizing Provider  albuterol (PROVENTIL HFA;VENTOLIN HFA) 108 (90 Base) MCG/ACT inhaler Inhale 2 puffs into the lungs every 6 (six) hours as needed for wheezing or shortness of breath. 08/20/16  Yes [place],  Malka So, MD  aspirin EC 81 MG tablet Take 1 tablet (81 mg total) by mouth 2 (two) times daily. 06/08/17  Yes Corky Sing, PA-C  B Complex Vitamins (VITAMIN-B COMPLEX PO) Take 1 tablet by mouth daily.    Yes [provider]  bisacodyl (DULCOLAX) 5 MG EC tablet Take 1 tablet (5 mg total) by mouth daily as needed for moderate constipation. 06/12/17  Yes Nita Sells, MD    Cholecalciferol (VITAMIN D3) 5000 units TABS Take 5,000 Units by mouth daily.    Yes [provider]  Coenzyme Q10 (CO Q 10) 100 MG CAPS Take 100 mg by mouth daily.   Yes [provider]  diltiazem (CARDIZEM CD) 360 MG 24 hr capsule Take 1 capsule (360 mg total) by mouth daily. 07/26/17 08/25/17 Yes Dessa Phi, DO  doxazosin (CARDURA) 1 MG tablet Take 1 mg by mouth every evening.   Yes [provider]  ferrous sulfate 325 (65 FE) MG tablet Take 1 tablet (325 mg total) by mouth daily with breakfast. 12/22/16  Yes [place], Betty G, MD  furosemide (LASIX) 80 MG tablet Take 80 mg by mouth 2 (two) times daily.   Yes [provider]  isosorbide mononitrate (IMDUR) 60 MG 24 hr tablet Take 0.5 tablets (30 mg total) daily by mouth. 06/22/17  Yes Caren Griffins, MD  Melatonin 3 MG TABS Take 10 mg by mouth at bedtime.    Yes [provider]  metoprolol tartrate (LOPRESSOR) 25 MG tablet Take 0.5 tablets (12.5 mg total) by mouth 2 (two) times daily. 07/26/17 08/25/17 Yes Dessa Phi, DO  Multiple Vitamin (MULTI VITAMIN PO) Take 1 tablet by mouth daily.    Yes [provider]  Omega 3 1000 MG CAPS Take 2,000 mg by mouth daily.   Yes [provider]  polyethylene glycol (MIRALAX / GLYCOLAX) packet Take 17 g by mouth daily. Patient taking differently: Take 34 g by mouth daily.  06/12/17  Yes Nita Sells, MD  potassium chloride 20 MEQ TBCR Take 20 mEq by mouth daily. 07/25/17  Yes Dessa Phi, DO  Probiotic Product (PROBIOTIC PO) Chew 1 tablet (250 million cell) by mouth once a day   Yes [provider]  senna (SENOKOT) 8.6 MG TABS tablet Take 1 tablet by mouth every morning.   Yes [provider]  traMADol (ULTRAM) 50 MG tablet Take 1 tablet (50 mg total) every 6 (six) hours as needed by mouth for moderate pain. 06/22/17  Yes Caren Griffins, MD    Family History Family History  Problem Relation Age of Onset  . COPD  Brother   . Heart attack Brother   . Cancer Father   . Heart attack Father   . Diabetes Mother     Social History Social History   Tobacco Use  . Smoking status: Former Smoker    Years: 10.00    Types: Cigarettes    Last attempt to quit: 1947    Years since quitting: 72.0  . Smokeless tobacco: Never Used  Substance Use Topics  . Alcohol use: Yes    Alcohol/week: 0.6 oz    Types: 1 Glasses of wine per week  . Drug use: No     Allergies   Iodinated diagnostic agents; Ace inhibitors; Shellfish-derived products; and Penicillins   Review of Systems Review of Systems  Constitutional: Negative for appetite change, chills, diaphoresis, fatigue and fever.  HENT: Negative for mouth sores, sore throat and trouble swallowing.  Eyes: Negative for visual disturbance.  Respiratory: Positive for cough and shortness of breath. Negative for chest tightness and wheezing.   Cardiovascular: Negative for chest pain.  Gastrointestinal: Negative for abdominal distention, abdominal pain, diarrhea, nausea and vomiting.  Endocrine: Negative for polydipsia, polyphagia and polyuria.  Genitourinary: Negative for dysuria, frequency and hematuria.  Musculoskeletal: Negative for gait problem.  Skin: Negative for color change, pallor and rash.  Neurological: Negative for dizziness, syncope, light-headedness and headaches.  Hematological: Does not bruise/bleed easily.  Psychiatric/Behavioral: Negative for behavioral problems and confusion.     Physical Exam Updated Vital Signs BP 122/76   Pulse (!) 28   Temp (!) 97.5 F (36.4 C) (Oral)   Resp (!) 35   SpO2 92%   Physical Exam  Constitutional: No distress.  Appears thin and frail.   HENT:  Head: Normocephalic.  Eyes: Conjunctivae are normal. Pupils are equal, round, and reactive to light. No scleral icterus.  Neck: Normal range of motion. Neck supple. No thyromegaly present.  Cardiovascular: Normal rate and regular rhythm. Exam reveals no  gallop and no friction rub.  No murmur heard. Pulmonary/Chest: Effort normal and breath sounds normal. No respiratory distress. He has no wheezes. He has no rales.  Tachypneic. No marked increased work of breathing. Wheezing and prolongation.  Abdominal: Soft. Bowel sounds are normal. He exhibits no distension. There is no tenderness. There is no rebound.  Musculoskeletal: Normal range of motion.  Neurological: He is alert.  Able to answer simple questions. Mainly yes and no. He is not oriented to place currently.  Skin: Skin is warm and dry. No rash noted.  Psychiatric: He has a normal mood and affect. His behavior is normal.     ED Treatments / Results  Labs (all labs ordered are listed, but only abnormal results are displayed) Labs Reviewed  CBC WITH DIFFERENTIAL/PLATELET - Abnormal; Notable for the following components:      Result Value   RBC 3.65 (*)    Hemoglobin 10.3 (*)    HCT 29.9 (*)    RDW 15.9 (*)    Platelets 79 (*)    All other components within normal limits  COMPREHENSIVE METABOLIC PANEL - Abnormal; Notable for the following components:   Sodium 132 (*)    Potassium 5.5 (*)    Chloride 95 (*)    CO2 21 (*)    Glucose, Bld 149 (*)    BUN 134 (*)    Creatinine, Ser 6.70 (*)    Calcium 8.8 (*)    Albumin 3.2 (*)    AST 61 (*)    Alkaline Phosphatase 146 (*)    GFR calc non Af Amer 6 (*)    GFR calc Af Amer 7 (*)    Anion gap 16 (*)    All other components within normal limits  BRAIN NATRIURETIC PEPTIDE - Abnormal; Notable for the following components:   B Natriuretic Peptide 749.9 (*)    All other components within normal limits  I-STAT CG4 LACTIC ACID, ED - Abnormal; Notable for the following components:   Lactic Acid, Venous 2.01 (*)    All other components within normal limits  CULTURE, BLOOD (ROUTINE X 2)  CULTURE, BLOOD (ROUTINE X 2)  LACTIC ACID, PLASMA  I-STAT TROPONIN, ED    EKG  EKG Interpretation None       Radiology Dg Chest 2  View  Result Date: 08/08/2017 CLINICAL DATA:  Shortness of breath. EXAM: CHEST  2 VIEW COMPARISON:  Radiograph July 19, 2017. FINDINGS: Stable cardiomegaly and central pulmonary vascular congestion is noted. Atherosclerosis of thoracic aorta is noted. No pneumothorax is noted. Increased bibasilar opacities are noted concerning for pneumonia or edema. Probable mild right pleural effusion is noted. Degenerative changes seen involving the left glenohumeral joint. IMPRESSION: Stable cardiomegaly and central pulmonary vascular congestion. Increased bibasilar opacities are noted concerning for worsening edema or infiltrates, with probable mild right pleural effusion. Electronically Signed   By: Marijo Conception, M.D.   On: 08/08/2017 10:02    Procedures Procedures (including critical care time)  Medications Ordered in ED Medications  albuterol (PROVENTIL,VENTOLIN) solution continuous neb (10 mg/hr Nebulization Given 08/08/17 0956)  sodium chloride 0.9 % bolus 500 mL (0 mLs Intravenous Stopped 08/08/17 1324)  ceFEPIme (MAXIPIME) 2 g in dextrose 5 % 50 mL IVPB (0 g Intravenous Stopped 08/08/17 1134)  vancomycin (VANCOCIN) IVPB 1000 mg/200 mL premix (0 mg Intravenous Stopped 08/08/17 1234)     Initial Impression / Assessment and Plan / ED Course  I have reviewed the triage vital signs and the nursing notes.  Pertinent labs & imaging results that were available during my care of the patient were reviewed by me and considered in my medical decision making (see chart for details).   given steroids and albuterol via paramedics per he continues to wheeze. Does not have JVD or gallop. Is in A. fib but rate controlled. Clinically does not sound like congestive heart failure. Plan rectal temperature, imaging, labs. Additional nebulized albuterol. Reevaluation.  Final Clinical Impressions(s) / ED Diagnoses   Final diagnoses:  HCAP (healthcare-associated pneumonia)   Patient with acute on chronic renal  failure. Baseline creatinine around 4.5. He is 6.7 today. Does have elevated BUN and clinically appears dry. Given IV fluids 500 mL. Does have elevation of BNP. Has chronic venous congestion on chest x-ray but not frank failure clinically. Heart rate improves. Still requiring O2 at 2 L. Chest x-ray shows worsening basilar infiltrates.  A long discussion with the patient's wife. She states that he has been discussed with her recently that he would not want to be resuscitated. He has declined and his physician has not recommended him for dialysis. Think this is a progressive now healthcare associated pneumonia. Given Maxipime and vancomycin. Fluids have been slowed. Hemolytically stable and oxygenating well on 2 L. We'll discussed with hospitalist regarding admission.   ED Discharge Orders    None       Tanna Furry, MD 08/08/17 1359

## 2017-08-09 ENCOUNTER — Inpatient Hospital Stay (HOSPITAL_COMMUNITY): Payer: Medicare Other

## 2017-08-09 DIAGNOSIS — J690 Pneumonitis due to inhalation of food and vomit: Secondary | ICD-10-CM

## 2017-08-09 DIAGNOSIS — I371 Nonrheumatic pulmonary valve insufficiency: Secondary | ICD-10-CM

## 2017-08-09 DIAGNOSIS — N185 Chronic kidney disease, stage 5: Secondary | ICD-10-CM

## 2017-08-09 DIAGNOSIS — R7989 Other specified abnormal findings of blood chemistry: Secondary | ICD-10-CM

## 2017-08-09 DIAGNOSIS — I361 Nonrheumatic tricuspid (valve) insufficiency: Secondary | ICD-10-CM

## 2017-08-09 LAB — CBC
HEMATOCRIT: 28.2 % — AB (ref 39.0–52.0)
HEMOGLOBIN: 9.8 g/dL — AB (ref 13.0–17.0)
MCH: 28.2 pg (ref 26.0–34.0)
MCHC: 34.8 g/dL (ref 30.0–36.0)
MCV: 81.3 fL (ref 78.0–100.0)
Platelets: 85 10*3/uL — ABNORMAL LOW (ref 150–400)
RBC: 3.47 MIL/uL — AB (ref 4.22–5.81)
RDW: 15.6 % — ABNORMAL HIGH (ref 11.5–15.5)
WBC: 6.3 10*3/uL (ref 4.0–10.5)

## 2017-08-09 LAB — COMPREHENSIVE METABOLIC PANEL
ALBUMIN: 3.2 g/dL — AB (ref 3.5–5.0)
ALK PHOS: 131 U/L — AB (ref 38–126)
ALT: 38 U/L (ref 17–63)
AST: 37 U/L (ref 15–41)
Anion gap: 13 (ref 5–15)
BILIRUBIN TOTAL: 0.9 mg/dL (ref 0.3–1.2)
CALCIUM: 8.8 mg/dL — AB (ref 8.9–10.3)
CO2: 23 mmol/L (ref 22–32)
Chloride: 96 mmol/L — ABNORMAL LOW (ref 101–111)
Creatinine, Ser: 7.11 mg/dL — ABNORMAL HIGH (ref 0.61–1.24)
GFR calc Af Amer: 7 mL/min — ABNORMAL LOW (ref >60)
GFR calc non Af Amer: 6 mL/min — ABNORMAL LOW (ref >60)
GLUCOSE: 249 mg/dL — AB (ref 65–99)
Potassium: 5 mmol/L (ref 3.5–5.1)
Sodium: 132 mmol/L — ABNORMAL LOW (ref 135–145)
TOTAL PROTEIN: 6.6 g/dL (ref 6.5–8.1)

## 2017-08-09 LAB — ECHOCARDIOGRAM COMPLETE
HEIGHTINCHES: 65 in
Weight: 2076.8 oz

## 2017-08-09 MED ORDER — METRONIDAZOLE IN NACL 5-0.79 MG/ML-% IV SOLN
500.0000 mg | Freq: Three times a day (TID) | INTRAVENOUS | Status: DC
  Administered 2017-08-09 – 2017-08-16 (×21): 500 mg via INTRAVENOUS
  Filled 2017-08-09 (×23): qty 100

## 2017-08-09 MED ORDER — CEFEPIME HCL 2 G IJ SOLR
2.0000 g | INTRAMUSCULAR | Status: DC
  Administered 2017-08-09: 2 g via INTRAVENOUS
  Filled 2017-08-09: qty 2

## 2017-08-09 MED ORDER — DEXTROSE 5 % IV SOLN
500.0000 mg | INTRAVENOUS | Status: DC
  Administered 2017-08-09 – 2017-08-15 (×7): 500 mg via INTRAVENOUS
  Filled 2017-08-09 (×9): qty 0.5

## 2017-08-09 NOTE — Plan of Care (Signed)
  Education: Knowledge of General Education information will improve 08/09/2017 0601 - Progressing by Ashley Murrain, RN

## 2017-08-09 NOTE — Progress Notes (Signed)
Pharmacy Antibiotic Note  Johnny Morrow is a 83 y.o. male admitted on 08/08/2017 with pneumonia.  Pharmacy has been consulted for Vancomycin, cefepime dosing.  Plan: Vancomycin 1gm iv x1, then check vancomycin random level on 12/24 at 1000 (~48hr post dose).  Will redose if level < 20. Cefepime 2gm iv q24hr   Height: 5\' 5"  (165.1 cm) Weight: 129 lb 12.8 oz (58.9 kg) IBW/kg (Calculated) : 61.5  Temp (24hrs), Avg:97.5 F (36.4 C), Min:97.1 F (36.2 C), Max:97.9 F (36.6 C)  Recent Labs  Lab 08/08/17 0911 08/08/17 0927 08/08/17 1339  WBC 5.6  --   --   CREATININE 6.70*  --   --   LATICACIDVEN  --  2.01* 3.6*    Estimated Creatinine Clearance: 5.5 mL/min (A) (by C-G formula based on SCr of 6.7 mg/dL (H)).    Allergies  Allergen Reactions  . Iodinated Diagnostic Agents Rash  . Ace Inhibitors Other (See Comments)    Noted (on MAR) to be "allergic," but no reaction was cited/noted  . Shellfish-Derived Products Other (See Comments)    Noted (on MAR) to be "allergic," but no reaction was cited/noted  . Penicillins Rash    Has patient had a PCN reaction causing immediate rash, facial/tongue/throat swelling, SOB or lightheadedness with hypotension: Yes Has patient had a PCN reaction causing severe rash involving mucus membranes or skin necrosis: Unknown Has patient had a PCN reaction that required hospitalization: Unknown Has patient had a PCN reaction occurring within the last 10 years: Unknown If all of the above answers are "NO", then may proceed with Cephalosporin use.     Antimicrobials this admission: Vancomycin 08/08/2017 >> Cefepime 08/08/2017 >>  Dose adjustments this admission: -  Microbiology results: pending  Thank you for allowing pharmacy to be a part of this patient's care.  Nani Skillern Crowford 08/09/2017 3:27 AM

## 2017-08-09 NOTE — Progress Notes (Addendum)
Pharmacy Antibiotic Note  Markis Langland is a 83 y.o. male admitted on 08/08/2017 with pneumonia.  Pharmacy has been consulted for Vancomycin, cefepime dosing. Patient with suspected aspiration PNA to add Flagyl to cefepime and d/c vanc for now  Plan: 1) Change cefepime to 500mg  IV q24 for CrCl < 10 ml/min 2) Flagyl 500mg  IV q8  Height: 5\' 5"  (165.1 cm) Weight: 129 lb 12.8 oz (58.9 kg) IBW/kg (Calculated) : 61.5  Temp (24hrs), Avg:98.2 F (36.8 C), Min:97.9 F (36.6 C), Max:98.4 F (36.9 C)  Recent Labs  Lab 08/08/17 0911 08/08/17 0927 08/08/17 1339 08/09/17 0457  WBC 5.6  --   --  6.3  CREATININE 6.70*  --   --  7.11*  LATICACIDVEN  --  2.01* 3.6*  --     Estimated Creatinine Clearance: 5.2 mL/min (A) (by C-G formula based on SCr of 7.11 mg/dL (H)).    Allergies  Allergen Reactions  . Iodinated Diagnostic Agents Rash  . Ace Inhibitors Other (See Comments)    Noted (on MAR) to be "allergic," but no reaction was cited/noted  . Shellfish-Derived Products Other (See Comments)    Noted (on MAR) to be "allergic," but no reaction was cited/noted  . Penicillins Rash    Has patient had a PCN reaction causing immediate rash, facial/tongue/throat swelling, SOB or lightheadedness with hypotension: Yes Has patient had a PCN reaction causing severe rash involving mucus membranes or skin necrosis: Unknown Has patient had a PCN reaction that required hospitalization: Unknown Has patient had a PCN reaction occurring within the last 10 years: Unknown If all of the above answers are "NO", then may proceed with Cephalosporin use.    Thank you for allowing pharmacy to be a part of this patient's care.   Adrian Saran, PharmD, BCPS Pager [phone] 08/09/2017 11:26 AM

## 2017-08-09 NOTE — Progress Notes (Signed)
Echocardiogram 2D Echocardiogram has been performed.  Joelene Millin 08/09/2017, 10:25 AM

## 2017-08-09 NOTE — Consult Note (Signed)
Johnny Morrow Admit Date: 08/08/2017 08/09/2017 Rexene Agent Requesting Physician:  Nevada Crane MD  Reason for Consult:  AoCKD5, HCAP, Failure to Thrive HPI:  83 year old male admitted 12/22 for dyspnea/hypoxia and being treated for healthcare acquired pneumonia/aspiration pneumonia.  PMH Incudes:  Chronic diastolic heart failure  History of atrial fibrillation not on chronic anticoagulation with history of severe retroperitoneal bleed  History of DVT  Hypertension  History of prostate cancer with radical prostatectomy 25 years ago  Aortic stenosis  Hip fracture in October 2018  Patient in the recent past has had several hospitalizations, most recently 12/2-12/8 where he was treated for pneumonia and CHF.  He also had admissions in October for pneumonia and hip fracture.  No known cognitive issues.  He lives at [facility].  With his progressive health issues, he has required transitioning from independent living to skilled nursing.  This is incredibly distressing to him as he wishes to be home and does not care for skilled facilities.  He was seen earlier this month by nephrology where given his age and comorbidities he was deemed not a candidate for dialysis.  He also follows with nephrology at [facility] and that note from 12/18 was reviewed where he also was stated not to be a dialysis candidate.  At that visit his furosemide dosing was increased.  The day prior to admission he developed progressive dyspnea and was resistant towards hospital evaluation but the following day, the day of admission, it had worsened to a point that he was agreeable.  Imaging has suggested bilateral infiltrates and potentially a right sided pleural effusion.  He was hypoxic on room air.  He has been treated with vancomycin, cefepime, Flagyl.  He has received 1 dose of furosemide.  Urine output is poorly documented and minimal.  Renal ultrasound from February of this year had bilateral normal-sized kidneys without  hydronephrosis.  He and his wife state that he was eating and drinking rather well until the day prior to admission.  He denies lower urinary tract symptoms.  He currently has a urinary condom catheter with urine in the collection bag.  No IV contrast, sulfa antibiotics, ACEi/ARB.  He states he has anorexia and that his food taste flat; he denies nausea, vomiting.   Creatinine, Ser (mg/dL)  Date Value  08/09/2017 7.11 (H)  08/08/2017 6.70 (H)  07/25/2017 4.37 (H)  07/24/2017 4.54 (H)  07/23/2017 4.30 (H)  07/22/2017 4.40 (H)  07/21/2017 4.33 (H)  07/20/2017 4.22 (H)  07/19/2017 4.40 (H)  07/19/2017 4.30 (H)  ] I/Os: I/O last 3 completed shifts: In: 39 [P.O.:240; IV Piggyback:750] Out: 100 [Urine:100]   ROS Balance of 12 systems is negative w/ exceptions as above  PMH  Past Medical History:  Diagnosis Date  . Anemia   . Arthritis   . Cataract    "ready to come out on both sides; anytime" (06/16/2017)  . Chronic diastolic (congestive) heart failure ([place])   . CKD (chronic kidney disease), stage III ([place])    "followed by Dr. Keturah Barre @ [facility]; CKD fluctuates between III and IV" (06/16/2017)  . DVT (deep venous thrombosis) ([facility])    LLE  . History of blood transfusion ~ 01/2017; 05/2017   "blood loss; anemia"  . Hypertension   . Permanent atrial fibrillation (H[facility])    atrial fibrillation /atrial flutter - not a candidate for anticoagulation due to advanced age  . Pneumonia 12/2016   "a touch"  . Prostate cancer [facility])     follows with hematologists q  6 months. S/p radical prostatectomy and proton therapy in 2001.  . Radiation cystitis   . Retinal hemorrhage of both eyes    follows with ophthalmologists; "never had OR" (06/16/2017)  . Retroperitoneal bleed ?12/2016   [name] 06/15/2017   PSH  Past Surgical History:  Procedure Laterality Date  . FRACTURE SURGERY    . INGUINAL HERNIA REPAIR    . INTRAMEDULLARY (IM) NAIL INTERTROCHANTERIC Right 06/07/2017   Procedure:  INTRAMEDULLARY (IM) NAIL INTERTROCHANTRIC;  Surgeon: [name], MD;  Location: [place];  Service: Orthopedics;  Laterality: Right;  . PROSTATECTOMY    . TIBIA FRACTURE SURGERY Left 06/2012  . TONSILLECTOMY     FH  Family History  Problem Relation Age of Onset  . COPD Brother   . Heart attack Brother   . Cancer Father   . Heart attack Father   . Diabetes Mother    [place]  reports that he quit smoking about 72 years ago. His smoking use included cigarettes. He quit after 10.00 years of use. he has never used smokeless tobacco. He reports that he drinks about 0.6 oz of alcohol per week. He reports that he does not use drugs. Allergies  Allergies  Allergen Reactions  . Iodinated Diagnostic Agents Rash  . Ace Inhibitors Other (See Comments)    Noted (on MAR) to be "allergic," but no reaction was cited/noted  . Shellfish-Derived Products Other (See Comments)    Noted (on MAR) to be "allergic," but no reaction was cited/noted  . Penicillins Rash    Has patient had a PCN reaction causing immediate rash, facial/tongue/throat swelling, SOB or lightheadedness with hypotension: Yes Has patient had a PCN reaction causing severe rash involving mucus membranes or skin necrosis: Unknown Has patient had a PCN reaction that required hospitalization: Unknown Has patient had a PCN reaction occurring within the last 10 years: Unknown If all of the above answers are "NO", then may proceed with Cephalosporin use.    Home medications Prior to Admission medications   Medication Sig Start Date End Date Taking? Authorizing Provider  albuterol (PROVENTIL HFA;VENTOLIN HFA) 108 (90 Base) MCG/ACT inhaler Inhale 2 puffs into the lungs every 6 (six) hours as needed for wheezing or shortness of breath. 08/20/16  Yes Martinique, Betty G, MD  aspirin EC 81 MG tablet Take 1 tablet (81 mg total) by mouth 2 (two) times daily. 06/08/17  Yes Corky Sing, PA-C  B Complex Vitamins (VITAMIN-B COMPLEX PO) Take 1 tablet by  mouth daily.    Yes [provider]  bisacodyl (DULCOLAX) 5 MG EC tablet Take 1 tablet (5 mg total) by mouth daily as needed for moderate constipation. 06/12/17  Yes Nita Sells, MD  Cholecalciferol (VITAMIN D3) 5000 units TABS Take 5,000 Units by mouth daily.    Yes [provider]  Coenzyme Q10 (CO Q 10) 100 MG CAPS Take 100 mg by mouth daily.   Yes [provider]  diltiazem (CARDIZEM CD) 360 MG 24 hr capsule Take 1 capsule (360 mg total) by mouth daily. 07/26/17 08/25/17 Yes Dessa Phi, DO  doxazosin (CARDURA) 1 MG tablet Take 1 mg by mouth every evening.   Yes [provider]  ferrous sulfate 325 (65 FE) MG tablet Take 1 tablet (325 mg total) by mouth daily with breakfast. 12/22/16  Yes Martinique, Betty G, MD  furosemide (LASIX) 80 MG tablet Take 80 mg by mouth 2 (two) times daily.   Yes [provider]  isosorbide mononitrate (IMDUR) 60 MG 24  hr tablet Take 0.5 tablets (30 mg total) daily by mouth. 06/22/17  Yes Caren Griffins, MD  Melatonin 3 MG TABS Take 10 mg by mouth at bedtime.    Yes [provider]  metoprolol tartrate (LOPRESSOR) 25 MG tablet Take 0.5 tablets (12.5 mg total) by mouth 2 (two) times daily. 07/26/17 08/25/17 Yes Dessa Phi, DO  Multiple Vitamin (MULTI VITAMIN PO) Take 1 tablet by mouth daily.    Yes [provider]  Omega 3 1000 MG CAPS Take 2,000 mg by mouth daily.   Yes [provider]  polyethylene glycol (MIRALAX / GLYCOLAX) packet Take 17 g by mouth daily. Patient taking differently: Take 34 g by mouth daily.  06/12/17  Yes Nita Sells, MD  potassium chloride 20 MEQ TBCR Take 20 mEq by mouth daily. 07/25/17  Yes Dessa Phi, DO  Probiotic Product (PROBIOTIC PO) Chew 1 tablet (250 million cell) by mouth once a day   Yes [provider]  senna (SENOKOT) 8.6 MG TABS tablet Take 1 tablet by mouth every morning.   Yes [provider]  traMADol (ULTRAM) 50 MG  tablet Take 1 tablet (50 mg total) every 6 (six) hours as needed by mouth for moderate pain. 06/22/17  Yes Caren Griffins, MD    Current Medications Scheduled Meds: . ceFEPime (MAXIPIME) IV  500 mg Intravenous Q24H  . ipratropium-albuterol  3 mL Nebulization Q6H  . mouth rinse  15 mL Mouth Rinse BID   Continuous Infusions: . metronidazole     PRN Meds:.  CBC Recent Labs  Lab 08/08/17 0911 08/09/17 0457  WBC 5.6 6.3  NEUTROABS 3.4  --   HGB 10.3* 9.8*  HCT 29.9* 28.2*  MCV 81.9 81.3  PLT 79* 85*   Basic Metabolic Panel Recent Labs  Lab 08/08/17 0911 08/09/17 0457  NA 132* 132*  K 5.5* 5.0  CL 95* 96*  CO2 21* 23  GLUCOSE 149* 249*  BUN 134* >100*  CREATININE 6.70* 7.11*  CALCIUM 8.8* 8.8*    Physical Exam  Blood pressure (!) 132/95, pulse 95, temperature 98.4 F (36.9 C), temperature source Oral, resp. rate 18, height 5\' 5"  (1.651 m), weight 58.9 kg (129 lb 12.8 oz), SpO2 94 %. GEN: Frail appearing, mildly increased work of breathing, conversant, pleasant, cognitively intact ENT: NCAT EYES: EOMI CV: Regular, normal S1-S2, no rub PULM: Coarse breath sounds bilaterally, especially in the bases, ABD: Soft, nontender SKIN: No rashes or lesions EXT: Trace lower extremity edema bilaterally GU: Normal genitalia, condom penile catheter   Assessment 41M with AoCKD5 in setting of recurrent pneumonia, SOB, failure to thrive  1. AoCKD5, azotemia, not a candidate for dialysis 2. HCAP on Vanc/Cefepime/Flagyl; BCx NGTD 3. Progressive Failure to Thrive, Loss of Independence 4. dCHF  Plan 1. I do not think that he needs further diuretics at the current time, but I would not give him fluids either.  Continue to encourage him to eat his regular diet 2. At length we explored his subacute decline over the past 2-3 months; he is very clear that his goals are to be at home and that his loss of independence, hospitalizations, and time in the nursing facility are all  undesired 3. We explored goals of care and focusing on quality rather than quantity of days; something he was very interested in 4. Again I confirmed and they agree that he is not a candidate for any form of dialysis 5. At this point they wish to treat those things  during this admission that are treatable, such as his pneumonia.  At the time of discharge they wish to transition to home based hospice care. 6. Patient will need a palliative care consult in order to best achieve these goals 7. I will check a renal ultrasound to make sure he is not obstructed, though I doubt this is the case 8. Continue strict I's and O's, daily renal panel 9. We will follow along at the current time   Pearson Grippe MD [phone] pgr 08/09/2017, 1:36 PM

## 2017-08-09 NOTE — Progress Notes (Signed)
PROGRESS NOTE  Johnny Morrow SJG:[id number] DOB: [birth year] DOA: 08/08/2017 PCP: Wenda Low, MD  HPI/Recap of past 24 hours: Johnny Morrow is a 83 y.o. male with medical history significant for chronic heart failure with preserved EF 65-70% (2017), CKD stage IV, chronic normocytic anemia, chronic a-fib not anticoagulated due to prior retroperitoneal hemorrhage, recent HCAP completed antibiotics 07/28/17 presents with complaints of progressively worsening dyspnea. He is alert and oriented x 3. Reports not on O2 supplement in the SNF. Admits to productive cough and dyspnea on exertion. Denies chest pain, chills, or fever.  Pt seen and examined with wife at bedside. Conversational Dyspnea. Denies chest pain. Admits to non productive cough. Worsening azotemia. Nephrology consulted. Patient to be discharged with hospice care. Palliative care team consulted.  Assessment/Plan: Active Problems:   HCAP (healthcare-associated pneumonia)  Acute hypoxic respiratory failure most likely 2/2 to HCAP, poa vs acute on chronic heart failure  -last echo in 2017 LV EF 65-70% -strict I&Os -BNP 700's -daily weights -O2 supplementation to maintain O2 saturation 92% or greater -lasix 40 mg IV once -continuous pulse oxymetry -ABG  HCAP, poa -management as stated above -sputum cx, blood cx in process -duonebs -IV vancomycin and IV cefepime day #1  Acute on chronic heart failure with preserved EF 65% -management as stated above -TTE ordered; last echo in 2017 -TTE 08/09/17 LVEF 50-55%  AKI on CKD stage IV -baseline cr 4.5 -cr 6.70 -azotemia with BUN 134  -avoid nephrotoxic agents and hypotension  Azotemia 2/2 to worsening renal function -BUN >100 -cr 7.11 from 6.70 -avoid diuretics -No altered mental status -no overt bleeding -BMP am -nephrology consulted  Acute Hyponatremia most likely dilutional -fluid restriction -monitor urine output  -Na+ 132 -repeat BMP am  Chronic  thrombocytopenia -plt 79 k -baseline plt 90 k -no sign of overt bleeding -cbc am   Code Status: DNR  Family Communication: wife at bedside. All questions answered to her satisfaction  Disposition Plan: will stay another midnight to continue IV antibiotics    Consultants:  nephrology  Procedures:  none  Antimicrobials:  Iv cefepime and IV flagyl for suspected aspiration PNA, poa  DVT prophylaxis:  SCDs   Objective: Vitals:   08/08/17 2307 08/08/17 2333 08/09/17 0155 08/09/17 0518  BP: (!) 143/90 (!) 138/97  (!) 132/95  Pulse: 85 85  95  Resp: 16 20  18   Temp:  97.9 F (36.6 C)  98.4 F (36.9 C)  TempSrc:  Oral  Oral  SpO2: 98% 94% 97% 98%  Weight:  58.9 kg (129 lb 12.8 oz)    Height:  5\' 5"  (1.651 m)      Intake/Output Summary (Last 24 hours) at 08/09/2017 0742 Last data filed at 08/09/2017 0521 Gross per 24 hour  Intake 990 ml  Output 100 ml  Net 890 ml   Filed Weights   08/08/17 2333  Weight: 58.9 kg (129 lb 12.8 oz)    Exam:   General:  83 yo AAM thin built on [place]. Alert and oriented x 3  Eyes: pupils are round and reactive to light   ENT: Mucous membrane are moist no erythema or exudates  Neck: No JVD or thyromegaly   Cardiovascular: IRRR no rubs or gallops  Respiratory: Diffused rhonchi bilaterally. No wheezes noted.  Abdomen: soft NT ND NBS x4  Skin: No noted open lesions  Musculoskeletal: Non focal. No LE edema  Psychiatric: Mood is appropriate for condition and setting  Neurologic: Alert and oriented x 3  Data Reviewed: CBC: Recent Labs  Lab 08/08/17 0911 08/09/17 0457  WBC 5.6 6.3  NEUTROABS 3.4  --   HGB 10.3* 9.8*  HCT 29.9* 28.2*  MCV 81.9 81.3  PLT 79* 85*   Basic Metabolic Panel: Recent Labs  Lab 08/08/17 0911 08/09/17 0457  NA 132* 132*  K 5.5* 5.0  CL 95* 96*  CO2 21* 23  GLUCOSE 149* 249*  BUN 134* >100*  CREATININE 6.70* 7.11*  CALCIUM 8.8* 8.8*   GFR: Estimated Creatinine Clearance: 5.2  mL/min (A) (by C-G formula based on SCr of 7.11 mg/dL (H)). Liver Function Tests: Recent Labs  Lab 08/08/17 0911 08/09/17 0457  AST 61* 37  ALT 42 38  ALKPHOS 146* 131*  BILITOT 0.9 0.9  PROT 6.5 6.6  ALBUMIN 3.2* 3.2*   No results for input(s): LIPASE, AMYLASE in the last 168 hours. No results for input(s): AMMONIA in the last 168 hours. Coagulation Profile: No results for input(s): INR, PROTIME in the last 168 hours. Cardiac Enzymes: No results for input(s): CKTOTAL, CKMB, CKMBINDEX, TROPONINI in the last 168 hours. BNP (last 3 results) Recent Labs    08/20/16 1221 12/22/16 1453  PROBNP 373.0* 999.0*   HbA1C: No results for input(s): HGBA1C in the last 72 hours. CBG: No results for input(s): GLUCAP in the last 168 hours. Lipid Profile: No results for input(s): CHOL, HDL, LDLCALC, TRIG, CHOLHDL, LDLDIRECT in the last 72 hours. Thyroid Function Tests: No results for input(s): TSH, T4TOTAL, FREET4, T3FREE, THYROIDAB in the last 72 hours. Anemia Panel: No results for input(s): VITAMINB12, FOLATE, FERRITIN, TIBC, IRON, RETICCTPCT in the last 72 hours. Urine analysis:    Component Value Date/Time   COLORURINE YELLOW 06/07/2017 [place] 06/07/2017 1313   LABSPEC 1.013 06/07/2017 1313   PHURINE 5.0 06/07/2017 1313   GLUCOSEU NEGATIVE 06/07/2017 1313   HGBUR NEGATIVE 06/07/2017 1313   BILIRUBINUR NEGATIVE 06/07/2017 1313   KETONESUR NEGATIVE 06/07/2017 1313   PROTEINUR 100 (A) 06/07/2017 1313   NITRITE NEGATIVE 06/07/2017 1313   LEUKOCYTESUR NEGATIVE 06/07/2017 1313   Sepsis Labs: @LABRCNTIP (procalcitonin:4,lacticidven:4)  )No results found for this or any previous visit (from the past 240 hour(s)).    Studies: Dg Chest 2 View  Result Date: 08/08/2017 CLINICAL DATA:  Shortness of breath. EXAM: CHEST  2 VIEW COMPARISON:  Radiograph July 19, 2017. FINDINGS: Stable cardiomegaly and central pulmonary vascular congestion is noted. Atherosclerosis  of thoracic aorta is noted. No pneumothorax is noted. Increased bibasilar opacities are noted concerning for pneumonia or edema. Probable mild right pleural effusion is noted. Degenerative changes seen involving the left glenohumeral joint. IMPRESSION: Stable cardiomegaly and central pulmonary vascular congestion. Increased bibasilar opacities are noted concerning for worsening edema or infiltrates, with probable mild right pleural effusion. Electronically Signed   By: Marijo Conception, M.D.   On: 08/08/2017 10:02    Scheduled Meds: . ipratropium-albuterol  3 mL Nebulization Q6H  . mouth rinse  15 mL Mouth Rinse BID    Continuous Infusions: . ceFEPime (MAXIPIME) IV       LOS: 1 day     Kayleen Memos, MD [facility] Pager [phone]  If 7PM-7AM, please contact night-coverage [email or web address] Password [facility] 08/09/2017, 7:42 AM

## 2017-08-10 DIAGNOSIS — N19 Unspecified kidney failure: Secondary | ICD-10-CM

## 2017-08-10 DIAGNOSIS — I5031 Acute diastolic (congestive) heart failure: Secondary | ICD-10-CM

## 2017-08-10 LAB — CBC
HCT: 29.8 % — ABNORMAL LOW (ref 39.0–52.0)
HEMOGLOBIN: 10.3 g/dL — AB (ref 13.0–17.0)
MCH: 28.2 pg (ref 26.0–34.0)
MCHC: 34.6 g/dL (ref 30.0–36.0)
MCV: 81.6 fL (ref 78.0–100.0)
Platelets: 88 10*3/uL — ABNORMAL LOW (ref 150–400)
RBC: 3.65 MIL/uL — ABNORMAL LOW (ref 4.22–5.81)
RDW: 15.9 % — AB (ref 11.5–15.5)
WBC: 8 10*3/uL (ref 4.0–10.5)

## 2017-08-10 LAB — BASIC METABOLIC PANEL
Anion gap: 16 — ABNORMAL HIGH (ref 5–15)
BUN: 134 mg/dL — AB (ref 6–20)
CALCIUM: 8.8 mg/dL — AB (ref 8.9–10.3)
CHLORIDE: 98 mmol/L — AB (ref 101–111)
CO2: 22 mmol/L (ref 22–32)
CREATININE: 6.58 mg/dL — AB (ref 0.61–1.24)
GFR calc non Af Amer: 6 mL/min — ABNORMAL LOW (ref >60)
GFR, EST AFRICAN AMERICAN: 7 mL/min — AB (ref >60)
Glucose, Bld: 174 mg/dL — ABNORMAL HIGH (ref 65–99)
Potassium: 3.8 mmol/L (ref 3.5–5.1)
SODIUM: 136 mmol/L (ref 135–145)

## 2017-08-10 MED ORDER — FERROUS SULFATE 325 (65 FE) MG PO TABS
325.0000 mg | ORAL_TABLET | Freq: Every day | ORAL | Status: DC
  Administered 2017-08-11 – 2017-08-16 (×6): 325 mg via ORAL
  Filled 2017-08-10 (×6): qty 1

## 2017-08-10 MED ORDER — ISOSORBIDE MONONITRATE ER 30 MG PO TB24
30.0000 mg | ORAL_TABLET | Freq: Every day | ORAL | Status: DC
  Administered 2017-08-10 – 2017-08-16 (×7): 30 mg via ORAL
  Filled 2017-08-10 (×7): qty 1

## 2017-08-10 MED ORDER — FUROSEMIDE 10 MG/ML IJ SOLN
160.0000 mg | Freq: Three times a day (TID) | INTRAMUSCULAR | Status: DC
  Administered 2017-08-10 – 2017-08-11 (×3): 160 mg via INTRAVENOUS
  Filled 2017-08-10 (×2): qty 16
  Filled 2017-08-10: qty 4
  Filled 2017-08-10: qty 16

## 2017-08-10 MED ORDER — METOPROLOL TARTRATE 25 MG PO TABS
12.5000 mg | ORAL_TABLET | Freq: Two times a day (BID) | ORAL | Status: DC
  Administered 2017-08-10 – 2017-08-16 (×12): 12.5 mg via ORAL
  Filled 2017-08-10 (×12): qty 1

## 2017-08-10 MED ORDER — IPRATROPIUM-ALBUTEROL 0.5-2.5 (3) MG/3ML IN SOLN
3.0000 mL | Freq: Three times a day (TID) | RESPIRATORY_TRACT | Status: DC
  Administered 2017-08-11 – 2017-08-15 (×13): 3 mL via RESPIRATORY_TRACT
  Filled 2017-08-10 (×13): qty 3

## 2017-08-10 MED ORDER — DILTIAZEM HCL ER COATED BEADS 180 MG PO CP24
360.0000 mg | ORAL_CAPSULE | Freq: Every day | ORAL | Status: DC
  Administered 2017-08-10 – 2017-08-16 (×7): 360 mg via ORAL
  Filled 2017-08-10 (×7): qty 2

## 2017-08-10 NOTE — Progress Notes (Signed)
Pt HR sustaining in the 130s-140s. BP 146 102 Pt is asymptomatic w/no complaints of chest pain. EKG on chart and MD made aware

## 2017-08-10 NOTE — Consult Note (Signed)
   [facility] CM Inpatient Consult   08/10/2017  Johnny Morrow 04-14-1920 [id number]  Referral received from inpatient RNCM of the patient's re-admission.  Patient was active with [place] with inpatient, Cookie regarding referral she states that it was a notification of the patient to return home with the [place] program. No other needs identified.  Left a message with Tommi Rumps for [facility] regarding the Palliative consult that was referred during hospital.  For questions, please contact:  Natividad Brood, RN BSN [facility] Liaison  [phone] business mobile phone Toll free office [phone]

## 2017-08-10 NOTE — Progress Notes (Signed)
Palliative Medicine consult noted. Due to high referral volume, there will be a delay seeing this patient. Please call the Palliative Medicine Team office at [phone] if recommendations are needed in the interim.  If patient and family want hospice care, RNCM can set it up at home & SW can set it up at a facility without a PMT consult.   Marjie Skiff Aisha Greenberger, RN, BSN, [facility] 08/10/2017 8:59 AM Office [phone]

## 2017-08-10 NOTE — Progress Notes (Signed)
PROGRESS NOTE  Johnny Morrow BSJ:[id number] DOB: 19-Nov-1919 DOA: 08/08/2017 PCP: Wenda Low, MD  HPI/Recap of past 24 hours: Johnny Morrow is a 83 y.o. male with medical history significant for chronic heart failure with preserved EF 65-70% (2017), CKD stage IV, chronic normocytic anemia, chronic a-fib not anticoagulated due to prior retroperitoneal hemorrhage, recent HCAP completed antibiotics 07/28/17 presents with complaints of progressively worsening dyspnea. He is alert and oriented x 3. Reports not on O2 supplement in the SNF. Admits to productive cough and dyspnea on exertion. Denies chest pain, chills, or fever.  A-flutter overnight. EKG ordered this am.   Assessment/Plan: Active Problems:   HCAP (healthcare-associated pneumonia)  Acute hypoxic respiratory failure most likely 2/2 to HCAP, poa vs acute on chronic heart failure  -last echo in 2017 LV EF 65-70% -strict I&Os -BNP 700's -daily weights -O2 supplementation to maintain O2 saturation 92% or greater -lasix 40 mg IV once -continuous pulse oxymetry -ABG  HCAP, poa -management as stated above -sputum cx, blood cx in process -duonebs -IV vancomycin and IV cefepime day #1  Acute on chronic heart failure with preserved EF 65% -management as stated above -TTE ordered; last echo in 2017 -TTE 08/09/17 LVEF 50-55% -passed swallow eval -restarted po cardizem and po beta blocker  AKI on CKD stage IV -baseline cr 4.5 -cr 6.70 -azotemia with BUN 134  -avoid nephrotoxic agents and hypotension  Azotemia 2/2 to worsening renal function -BUN >100 -cr 7.11 from 6.70 -avoid diuretics -No altered mental status -no overt bleeding -BMP am -nephrology consulted  Acute Hyponatremia most likely dilutional -fluid restriction -monitor urine output  -Na+ 132 -repeat BMP am  Chronic thrombocytopenia -plt 79 k -baseline plt 90 k -no sign of overt bleeding -cbc am   Code Status: DNR  Family Communication:  wife at bedside. All questions answered to her satisfaction  Disposition Plan: will stay another midnight to continue IV antibiotics    Consultants:  nephrology  Procedures:  none  Antimicrobials:  Iv cefepime and IV flagyl for suspected aspiration PNA, poa  DVT prophylaxis:  SCDs   Objective: Vitals:   08/10/17 0102 08/10/17 0456 08/10/17 0459 08/10/17 0500  BP:  (!) 151/101 (!) 152/74   Pulse:  (!) 108    Resp:  18    Temp:  (!) 97.5 F (36.4 C)    TempSrc:  Oral    SpO2: 97% 98%    Weight:    59.4 kg (131 lb)  Height:        Intake/Output Summary (Last 24 hours) at 08/10/2017 0738 Last data filed at 08/10/2017 0622 Gross per 24 hour  Intake 350 ml  Output 600 ml  Net -250 ml   Filed Weights   08/08/17 2333 08/10/17 0500  Weight: 58.9 kg (129 lb 12.8 oz) 59.4 kg (131 lb)    Exam:   General:  83 yo AAM thin built on [place]. Alert and oriented x 3  Eyes: pupils are round and reactive to light   ENT: Mucous membrane are moist no erythema or exudates  Neck: No JVD or thyromegaly   Cardiovascular: IRRR no rubs or gallops  Respiratory: Diffused rhonchi bilaterally. No wheezes noted.  Abdomen: soft NT ND NBS x4  Skin: No noted open lesions  Musculoskeletal: Non focal. No LE edema  Psychiatric: Mood is appropriate for condition and setting  Neurologic: Alert and oriented x 3   Data Reviewed: CBC: Recent Labs  Lab 08/08/17 0911 08/09/17 0457 08/10/17 0444  WBC 5.6 6.3  8.0  NEUTROABS 3.4  --   --   HGB 10.3* 9.8* 10.3*  HCT 29.9* 28.2* 29.8*  MCV 81.9 81.3 81.6  PLT 79* 85* 88*   Basic Metabolic Panel: Recent Labs  Lab 08/08/17 0911 08/09/17 0457 08/10/17 0444  NA 132* 132* 136  K 5.5* 5.0 3.8  CL 95* 96* 98*  CO2 21* 23 22  GLUCOSE 149* 249* 174*  BUN 134* >100* 134*  CREATININE 6.70* 7.11* 6.58*  CALCIUM 8.8* 8.8* 8.8*   GFR: Estimated Creatinine Clearance: 5.6 mL/min (A) (by C-G formula based on SCr of 6.58 mg/dL  (H)). Liver Function Tests: Recent Labs  Lab 08/08/17 0911 08/09/17 0457  AST 61* 37  ALT 42 38  ALKPHOS 146* 131*  BILITOT 0.9 0.9  PROT 6.5 6.6  ALBUMIN 3.2* 3.2*   No results for input(s): LIPASE, AMYLASE in the last 168 hours. No results for input(s): AMMONIA in the last 168 hours. Coagulation Profile: No results for input(s): INR, PROTIME in the last 168 hours. Cardiac Enzymes: No results for input(s): CKTOTAL, CKMB, CKMBINDEX, TROPONINI in the last 168 hours. BNP (last 3 results) Recent Labs    08/20/16 1221 12/22/16 1453  PROBNP 373.0* 999.0*   HbA1C: No results for input(s): HGBA1C in the last 72 hours. CBG: No results for input(s): GLUCAP in the last 168 hours. Lipid Profile: No results for input(s): CHOL, HDL, LDLCALC, TRIG, CHOLHDL, LDLDIRECT in the last 72 hours. Thyroid Function Tests: No results for input(s): TSH, T4TOTAL, FREET4, T3FREE, THYROIDAB in the last 72 hours. Anemia Panel: No results for input(s): VITAMINB12, FOLATE, FERRITIN, TIBC, IRON, RETICCTPCT in the last 72 hours. Urine analysis:    Component Value Date/Time   COLORURINE YELLOW 06/07/2017 [place] 06/07/2017 1313   LABSPEC 1.013 06/07/2017 1313   PHURINE 5.0 06/07/2017 1313   GLUCOSEU NEGATIVE 06/07/2017 1313   HGBUR NEGATIVE 06/07/2017 1313   BILIRUBINUR NEGATIVE 06/07/2017 1313   KETONESUR NEGATIVE 06/07/2017 1313   PROTEINUR 100 (A) 06/07/2017 1313   NITRITE NEGATIVE 06/07/2017 1313   LEUKOCYTESUR NEGATIVE 06/07/2017 1313   Sepsis Labs: @LABRCNTIP (procalcitonin:4,lacticidven:4)  ) Recent Results (from the past 240 hour(s))  Culture, blood (Routine X 2) w Reflex to ID Panel     Status: None (Preliminary result)   Collection Time: 08/08/17  9:11 AM  Result Value Ref Range Status   Specimen Description BLOOD BLOOD LEFT HAND  Final   Special Requests   Final    BOTTLES DRAWN AEROBIC AND ANAEROBIC Blood Culture adequate volume   Culture   Final    NO GROWTH  1 DAY Performed at [facility], [place] [address], [place], [place] [address]    Report Status PENDING  Incomplete  Culture, blood (Routine X 2) w Reflex to ID Panel     Status: None (Preliminary result)   Collection Time: 08/08/17  9:16 AM  Result Value Ref Range Status   Specimen Description BLOOD BLOOD LEFT ARM  Final   Special Requests   Final    BOTTLES DRAWN AEROBIC AND ANAEROBIC Blood Culture adequate volume   Culture   Final    NO GROWTH 1 DAY Performed at [facility], [place] [address] [place], [place] [address]    Report Status PENDING  Incomplete      Studies: US Renal  Result Date: 08/09/2017 CLINICAL DATA:  Acute kidney injury EXAM: RENAL / URINARY TRACT ULTRASOUND COMPLETE COMPARISON:  Ultrasound 10/03/2016 FINDINGS: Right Kidney: Length: 8.7 cm.  1.6 cm cyst in the upper pole which appears simple. Increased echotexture. No hydronephrosis. Left Kidney: Length: 8.6 cm.  Increased echotexture.  No hydronephrosis. Bladder: Appears normal for degree of bladder distention. IMPRESSION: Increased echotexture in the kidneys bilaterally compatible with chronic medical renal disease. No hydronephrosis. Electronically Signed   By: Rolm Baptise M.D.   On: 08/09/2017 18:08    Scheduled Meds: . ceFEPime (MAXIPIME) IV  500 mg Intravenous Q24H  . ipratropium-albuterol  3 mL Nebulization Q6H  . mouth rinse  15 mL Mouth Rinse BID    Continuous Infusions: . metronidazole 500 mg (08/10/17 0622)     LOS: 2 days     Kayleen Memos, MD [facility] Pager [phone]  If 7PM-7AM, please contact night-coverage [email or web address] Password [facility] 08/10/2017, 7:38 AM

## 2017-08-10 NOTE — Evaluation (Signed)
Clinical/Bedside Swallow Evaluation Patient Details  Name: Xzavion Doswell MRN: [id number] Date of Birth: 12/18/1919  Today's Date: 08/10/2017 Time: SLP Start Time (ACUTE ONLY): 1030 SLP Stop Time (ACUTE ONLY): 1042 SLP Time Calculation (min) (ACUTE ONLY): 12 min  Past Medical History:  Past Medical History:  Diagnosis Date  . Anemia   . Arthritis   . Cataract    "ready to come out on both sides; anytime" (06/16/2017)  . Chronic diastolic (congestive) heart failure ([place])   . CKD (chronic kidney disease), stage III ([place])    "followed by Dr. Keturah Barre @ [place]; CKD fluctuates between III and IV" (06/16/2017)  . DVT (deep venous thrombosis) (H[facility])    LLE  . History of blood transfusion ~ 01/2017; 05/2017   "blood loss; anemia"  . Hypertension   . Permanent atrial fibrillation ([facility])    atrial fibrillation /atrial flutter - not a candidate for anticoagulation due to advanced age  . Pneumonia 12/2016   "a touch"  . Prostate cancer [facility])     follows with hematologists q 6 months. S/p radical prostatectomy and proton therapy in 2001.  . Radiation cystitis   . Retinal hemorrhage of both eyes    follows with ophthalmologists; "never had OR" (06/16/2017)  . Retroperitoneal bleed ?12/2016   [name] 06/15/2017   Past Surgical History:  Past Surgical History:  Procedure Laterality Date  . FRACTURE SURGERY    . INGUINAL HERNIA REPAIR    . INTRAMEDULLARY (IM) NAIL INTERTROCHANTERIC Right 06/07/2017   Procedure: INTRAMEDULLARY (IM) NAIL INTERTROCHANTRIC;  Surgeon: [name], MD;  Location: [place];  Service: Orthopedics;  Laterality: Right;  . PROSTATECTOMY    . TIBIA FRACTURE SURGERY Left 06/2012  . TONSILLECTOMY     HPI:  83 year old male admitted 12/22 for dyspnea/hypoxia and being treated for healthcare acquired pneumonia/aspiration pneumonia.  Patient in the recent past has had several hospitalizations, most recently 12/2-12/8 where he was treated for pneumonia and CHF.He also had admissions in  October for pneumonia and hip fracture.  Hx includes DHF, afib, HTN. Pt reports increased frequency of choking episodes, generally with solids.  Plan is to D/C home with Palliative Care f/u.    Assessment / Plan / Recommendation Clinical Impression  Pt presents with s/s of dysphagia, most likely esophageal in nature, describing globus, solids "lodging" in throat.  He makes accommodations by eating more slowly, avoiding hard solids (breads/meats).  There were no overt s/s of aspiration during assessment with no focal CN deficits, brisk swallow, no coughing.  Pt tachypneic, which can impact the coordination of the swallow/respiratory mechanism and increase risk of aspiration.  Per notes, pt has discussed with MD the desire to go home with focus on quality of life; arrangements have been made for f/u with Palliative Care at [facility].  Given pt's wishes, current comfort with eating and ability to self-monitor, I would not recommend further SLP intervention nor any objective swallow studies.  Recommend diet per pt's preferences - our services wil respectfully sign off.   SLP Visit Diagnosis: Dysphagia, unspecified (R13.10)    Aspiration Risk       Diet Recommendation   regular solids, thin liquids per pt's discretion  Medication Administration: Whole meds with liquid    Other  Recommendations     Follow up Recommendations        Frequency and Duration            Prognosis        Swallow Study   General HPI: 83 year old  male admitted 12/22 for dyspnea/hypoxia and being treated for healthcare acquired pneumonia/aspiration pneumonia.  Patient in the recent past has had several hospitalizations, most recently 12/2-12/8 where he was treated for pneumonia and CHF.He also had admissions in October for pneumonia and hip fracture.  Hx includes DHF, afib, HTN. Pt reports increased frequency of choking episodes, generally with solids.  Plan is to D/C home with Palliative Care f/u.  Type of Study:  Bedside Swallow Evaluation Previous Swallow Assessment: no Diet Prior to this Study: Regular;Thin liquids Temperature Spikes Noted: No Respiratory Status: Nasal cannula History of Recent Intubation: No Behavior/Cognition: Alert;Cooperative Oral Cavity Assessment: Within Functional Limits Oral Care Completed by SLP: No Vision: Functional for self-feeding Self-Feeding Abilities: Able to feed self Patient Positioning: Upright in chair Baseline Vocal Quality: Normal Volitional Cough: Strong Volitional Swallow: Able to elicit    Oral/Motor/Sensory Function Overall Oral Motor/Sensory Function: Within functional limits   Ice Chips Ice chips: Within functional limits   Thin Liquid Thin Liquid: Within functional limits    Nectar Thick Nectar Thick Liquid: Not tested   Honey Thick Honey Thick Liquid: Not tested   Puree Puree: Within functional limits   Solid   GO   Solid: Not tested        Juan Quam Laurice 08/10/2017,10:50 AM

## 2017-08-10 NOTE — Progress Notes (Signed)
[facility] Progress Note  Subjective: SOB a little better, 600 cc UOP yest and 300 cc today.  No new c/o's.    Vitals:   08/10/17 0459 08/10/17 0500 08/10/17 0838 08/10/17 0844  BP: (!) 152/74     Pulse:      Resp:      Temp:      TempSrc:      SpO2:   98% 98%  Weight:  59.4 kg (131 lb)    Height:        Inpatient medications: . ceFEPime (MAXIPIME) IV  500 mg Intravenous Q24H  . ipratropium-albuterol  3 mL Nebulization Q6H  . mouth rinse  15 mL Mouth Rinse BID   . furosemide 160 mg (08/10/17 1426)  . metronidazole 500 mg (08/10/17 0622)     Exam: Elderly pleasant AAM, some ^WOB, not in distress, gurgling respirations +JVD Chest bilat rales 1/3 up RRR  Abd soft ntnd Ext 2+ pitting depend hip edema bilat Condom cath NF, ox 3  Last UA 06/07/17 - negative Renal US 12/23 - 8.5 cm kidneys, no hydro, chronic medical renal disease CXR - bilat pulm infiltrates/ edema     Impression: 1. Acute on CKD 5 - creat 6's, was in the 4's two mos ago.  Progressive renal decline, poor HD candidate.  Pt unstable resp status.  May have some vol overload, will start high dose IV lasix, not sure if it will help.  No other solutions to offer, very sick from resp standpoint and severe renal failure.  Pall care consult is pending, have d/w primary MD.  Would support transition to comfort measures. 2. Chron diast CHF - prob acute component now 3. DNR 4. FTT - from uremia    Plan - as above   Kelly Splinter MD [facility] pager [phone]   08/10/2017, 3:21 PM   Recent Labs  Lab 08/08/17 0911 08/09/17 0457 08/10/17 0444  NA 132* 132* 136  K 5.5* 5.0 3.8  CL 95* 96* 98*  CO2 21* 23 22  GLUCOSE 149* 249* 174*  BUN 134* >100* 134*  CREATININE 6.70* 7.11* 6.58*  CALCIUM 8.8* 8.8* 8.8*   Recent Labs  Lab 08/08/17 0911 08/09/17 0457  AST 61* 37  ALT 42 38  ALKPHOS 146* 131*  BILITOT 0.9 0.9  PROT 6.5 6.6  ALBUMIN 3.2* 3.2*   Recent Labs  Lab  08/08/17 0911 08/09/17 0457 08/10/17 0444  WBC 5.6 6.3 8.0  NEUTROABS 3.4  --   --   HGB 10.3* 9.8* 10.3*  HCT 29.9* 28.2* 29.8*  MCV 81.9 81.3 81.6  PLT 79* 85* 88*   Iron/TIBC/Ferritin/ %Sat    Component Value Date/Time   IRON 30 (L) 07/21/2017 0512   TIBC 253 07/21/2017 0512   FERRITIN 241 07/21/2017 0512   IRONPCTSAT 12 (L) 07/21/2017 2841

## 2017-08-11 LAB — BASIC METABOLIC PANEL
ANION GAP: 12 (ref 5–15)
BUN: 147 mg/dL — ABNORMAL HIGH (ref 6–20)
CHLORIDE: 100 mmol/L — AB (ref 101–111)
CO2: 27 mmol/L (ref 22–32)
Calcium: 8.7 mg/dL — ABNORMAL LOW (ref 8.9–10.3)
Creatinine, Ser: 5.72 mg/dL — ABNORMAL HIGH (ref 0.61–1.24)
GFR, EST AFRICAN AMERICAN: 9 mL/min — AB (ref >60)
GFR, EST NON AFRICAN AMERICAN: 7 mL/min — AB (ref >60)
Glucose, Bld: 143 mg/dL — ABNORMAL HIGH (ref 65–99)
POTASSIUM: 3.8 mmol/L (ref 3.5–5.1)
SODIUM: 139 mmol/L (ref 135–145)

## 2017-08-11 MED ORDER — FUROSEMIDE 10 MG/ML IJ SOLN
120.0000 mg | Freq: Three times a day (TID) | INTRAVENOUS | Status: DC
  Administered 2017-08-11 – 2017-08-12 (×3): 120 mg via INTRAVENOUS
  Filled 2017-08-11 (×2): qty 10
  Filled 2017-08-11: qty 2
  Filled 2017-08-11 (×2): qty 12

## 2017-08-11 NOTE — Progress Notes (Addendum)
Spoke with Mrs. Chiaramonte who states pt do not want to back to [facility] and do not want [facility] to be involved. Mrs. Longenecker continued to state that the Nephrology told her pt would not be discharge today because of fluid in pt's lungs, pt need more lasix. Mrs. Bethel continued to say pt will go home with [facility] with [name], then transition to Hospice. Explained to Mrs. Gongaware that [facility] was called in reference to Dr. Durene Cal call to refer pt to Hospice. Mrs. Boyajian states,"whatever is professionally recommended is what we will do." Dr. [place] Crane is aware of this information. [facility] and [facility] was called, to make aware of the above information. CM will continue to follow for discharge plans.

## 2017-08-11 NOTE — Care Management Note (Signed)
Case Management Note  Patient Details  Name: Johnny Morrow MRN: [id number] Date of Birth: 11-13-1919  Subjective/Objective:                    Action/Plan: spoke pt and wife at bedside in length on 12/24 concerning home with hospice. Both explained Home [facility] program and selected to go home with [facility] . A call today 12/25 from MD stated pt will need to go home with Hospice. [facility] was called to give referral.Checking to see if pt can be seen today related to it being a Holiday. Pt lives in [place] at [facility]. A call to [facility] revealed that pt can come rehab their.   E`xpected Discharge Date:                  Expected Discharge Plan:  [place]  In-House Referral:  [facility]  Discharge planning Services  CM Consult  Post Acute Care Choice:    Choice offered to:  Spouse, Patient  DME Arranged:    DME Agency:     HH Arranged:  RN, PT, OT, NA HH Agency:  [facility]  Status of Service:  Completed, signed off  If discussed at [place] of Stay Meetings, dates discussed:    Additional Comments:  Purcell Mouton, RN 08/11/2017, 10:50 AM

## 2017-08-11 NOTE — Progress Notes (Signed)
PROGRESS NOTE  Johnny Morrow WUX:[id number] DOB: 14-Jan-1920 DOA: 08/08/2017 PCP: Wenda Low, MD  HPI/Recap of past 24 hours: Johnny Morrow is a 83 y.o. male with medical history significant for chronic heart failure with preserved EF 65-70% (2017), CKD stage IV, chronic normocytic anemia, chronic a-fib not anticoagulated due to prior retroperitoneal hemorrhage, recent HCAP completed antibiotics 07/28/17 presents with complaints of progressively worsening dyspnea. He is alert and oriented x 3. Reports not on O2 supplement in the SNF. Admits to productive cough and dyspnea on exertion. Denies chest pain, chills, or fever.  Responded well clinically to diuresis with good urine output. Nephrology following and we appreciate recommendations. Will continue current management.  Assessment/Plan: Active Problems:   HCAP (healthcare-associated pneumonia)  Acute hypoxic respiratory failure most likely 2/2 to HCAP, poa vs acute on chronic heart failure  -last echo in 2017 LV EF 65-70% -repeated echo 08/09/17 revealed LVEF 50-55% -strict I&Os -BNP 700's -daily weights -O2 supplementation to maintain O2 saturation 92% or greater -lasix IV per nephrology -continuous pulse oxymetry  HCAP, poa -management as stated above -sputum cx, blood cx x 2 no growth 2 days -MRSA negative -duonebs -IV flagyl and IV cefepime day #2 -stopped IV vancomycin  Acute on chronic heart failure with preserved EF 50-55% -management as stated above -TTE ordered; last echo in 2017 -TTE 08/09/17 LVEF 50-55% -passed swallow eval -restarted po cardizem and po beta blocker  Aflutter -Improved after restarting meds -po diltiazem and po lopressor  AKI on CKD stage V -baseline cr 4.5 -cr 6.70 -azotemia with BUN 147 (08/11/17) -avoid nephrotoxic agents and hypotension -nephrology following -we appreciate recommendations  Azotemia 2/2 to worsening renal function -BUN >100 -cr 7.11 from 6.70 -No altered mental  status -no overt bleeding -BMP am -nephrology following  Acute Hyponatremia most likely dilutional, resolved -fluid restriction -monitor urine output  -Na+ 136 from 132 -BMP am  Chronic thrombocytopenia, improving -plt 88k from 79 k -baseline plt 90 k -no sign of overt bleeding -cbc am   Code Status: DNR  Family Communication: No family member at bedside  Disposition Plan: will stay another midnight to continue IV antibiotics and diuresis    Consultants:  nephrology  Procedures:  none  Antimicrobials:  Iv cefepime and IV flagyl day #2 for HCAP, poa  DVT prophylaxis:  SCDs   Objective: Vitals:   08/10/17 1953 08/10/17 2006 08/11/17 0434 08/11/17 0940  BP: (!) 161/92  (!) 146/92   Pulse: (!) 123  (!) 121   Resp: 20  20   Temp: 97.8 F (36.6 C)  97.7 F (36.5 C)   TempSrc: Oral  Axillary   SpO2: 98% 99% 98% 95%  Weight:   56.8 kg (125 lb 3.2 oz)   Height:        Intake/Output Summary (Last 24 hours) at 08/11/2017 1127 Last data filed at 08/11/2017 0751 Gross per 24 hour  Intake 548 ml  Output 3125 ml  Net -2577 ml   Filed Weights   08/08/17 2333 08/10/17 0500 08/11/17 0434  Weight: 58.9 kg (129 lb 12.8 oz) 59.4 kg (131 lb) 56.8 kg (125 lb 3.2 oz)    Exam:   General:  83 yo AAM thin built on [place]. Alert and oriented x 3  Eyes: pupils are round and reactive to light   ENT: Mucous membrane are moist no erythema or exudates  Neck: No JVD or thyromegaly   Cardiovascular: RRR no rubs or gallops  Respiratory: Diffused but improved rhonchi bilaterally. No  wheezes noted.  Abdomen: soft NT ND NBS x4  Skin: No noted open lesions  Musculoskeletal: Non focal. No LE edema  Psychiatric: Mood is appropriate for condition and setting  Neurologic: Alert and oriented x 3. No acute focal neurological deficits   Data Reviewed: CBC: Recent Labs  Lab 08/08/17 0911 08/09/17 0457 08/10/17 0444  WBC 5.6 6.3 8.0  NEUTROABS 3.4  --   --   HGB  10.3* 9.8* 10.3*  HCT 29.9* 28.2* 29.8*  MCV 81.9 81.3 81.6  PLT 79* 85* 88*   Basic Metabolic Panel: Recent Labs  Lab 08/08/17 0911 08/09/17 0457 08/10/17 0444 08/11/17 0913  NA 132* 132* 136 PENDING  K 5.5* 5.0 3.8 PENDING  CL 95* 96* 98* PENDING  CO2 21* 23 22 PENDING  GLUCOSE 149* 249* 174* PENDING  BUN 134* >100* 134* 147*  CREATININE 6.70* 7.11* 6.58* PENDING  CALCIUM 8.8* 8.8* 8.8* PENDING   GFR: CrCl cannot be calculated (This lab value cannot be used to calculate CrCl because it is not a number: PENDING). Liver Function Tests: Recent Labs  Lab 08/08/17 0911 08/09/17 0457  AST 61* 37  ALT 42 38  ALKPHOS 146* 131*  BILITOT 0.9 0.9  PROT 6.5 6.6  ALBUMIN 3.2* 3.2*   No results for input(s): LIPASE, AMYLASE in the last 168 hours. No results for input(s): AMMONIA in the last 168 hours. Coagulation Profile: No results for input(s): INR, PROTIME in the last 168 hours. Cardiac Enzymes: No results for input(s): CKTOTAL, CKMB, CKMBINDEX, TROPONINI in the last 168 hours. BNP (last 3 results) Recent Labs    08/20/16 1221 12/22/16 1453  PROBNP 373.0* 999.0*   HbA1C: No results for input(s): HGBA1C in the last 72 hours. CBG: No results for input(s): GLUCAP in the last 168 hours. Lipid Profile: No results for input(s): CHOL, HDL, LDLCALC, TRIG, CHOLHDL, LDLDIRECT in the last 72 hours. Thyroid Function Tests: No results for input(s): TSH, T4TOTAL, FREET4, T3FREE, THYROIDAB in the last 72 hours. Anemia Panel: No results for input(s): VITAMINB12, FOLATE, FERRITIN, TIBC, IRON, RETICCTPCT in the last 72 hours. Urine analysis:    Component Value Date/Time   COLORURINE YELLOW 06/07/2017 [place] 06/07/2017 1313   LABSPEC 1.013 06/07/2017 1313   PHURINE 5.0 06/07/2017 1313   GLUCOSEU NEGATIVE 06/07/2017 1313   HGBUR NEGATIVE 06/07/2017 1313   BILIRUBINUR NEGATIVE 06/07/2017 1313   KETONESUR NEGATIVE 06/07/2017 1313   PROTEINUR 100 (A) 06/07/2017  1313   NITRITE NEGATIVE 06/07/2017 1313   LEUKOCYTESUR NEGATIVE 06/07/2017 1313   Sepsis Labs: @LABRCNTIP (procalcitonin:4,lacticidven:4)  ) Recent Results (from the past 240 hour(s))  Culture, blood (Routine X 2) w Reflex to ID Panel     Status: None (Preliminary result)   Collection Time: 08/08/17  9:11 AM  Result Value Ref Range Status   Specimen Description BLOOD BLOOD LEFT HAND  Final   Special Requests   Final    BOTTLES DRAWN AEROBIC AND ANAEROBIC Blood Culture adequate volume   Culture   Final    NO GROWTH 2 DAYS Performed at [facility], [place] [address] [place], [place] [address]    Report Status PENDING  Incomplete  Culture, blood (Routine X 2) w Reflex to ID Panel     Status: None (Preliminary result)   Collection Time: 08/08/17  9:16 AM  Result Value Ref Range Status   Specimen Description BLOOD BLOOD LEFT ARM  Final   Special Requests   Final    BOTTLES DRAWN  AEROBIC AND ANAEROBIC Blood Culture adequate volume   Culture   Final    NO GROWTH 2 DAYS Performed at [facility], [place] [address], [place], [place] [address]    Report Status PENDING  Incomplete      Studies: No results found.  Scheduled Meds: . ceFEPime (MAXIPIME) IV  500 mg Intravenous Q24H  . diltiazem  360 mg Oral Daily  . ferrous sulfate  325 mg Oral Q breakfast  . ipratropium-albuterol  3 mL Nebulization TID  . isosorbide mononitrate  30 mg Oral Daily  . mouth rinse  15 mL Mouth Rinse BID  . metoprolol tartrate  12.5 mg Oral BID    Continuous Infusions: . furosemide    . metronidazole Stopped (08/11/17 0558)     LOS: 3 days     Kayleen Memos, MD [facility] Pager [phone]  If 7PM-7AM, please contact night-coverage [email or web address] Password [facility] 08/11/2017, 11:27 AM

## 2017-08-11 NOTE — Progress Notes (Addendum)
[facility] Progress Note  Subjective: SOB much better, voided 3 L with IV lasix yesterday.    Vitals:   08/10/17 1953 08/10/17 2006 08/11/17 0434 08/11/17 0940  BP: (!) 161/92  (!) 146/92   Pulse: (!) 123  (!) 121   Resp: 20  20   Temp: 97.8 F (36.6 C)  97.7 F (36.5 C)   TempSrc: Oral  Axillary   SpO2: 98% 99% 98% 95%  Weight:   56.8 kg (125 lb 3.2 oz)   Height:        Inpatient medications: . ceFEPime (MAXIPIME) IV  500 mg Intravenous Q24H  . diltiazem  360 mg Oral Daily  . ferrous sulfate  325 mg Oral Q breakfast  . ipratropium-albuterol  3 mL Nebulization TID  . isosorbide mononitrate  30 mg Oral Daily  . mouth rinse  15 mL Mouth Rinse BID  . metoprolol tartrate  12.5 mg Oral BID   . furosemide Stopped (08/11/17 0558)  . metronidazole Stopped (08/11/17 6144)     Exam: Elderly pleasant AAM, looks better, breathing better No jvd Chest faint rales at bases RRR  Abd soft ntnd Ext mild depend hip edema bilat Condom cath NF, ox 3  Last UA 06/07/17 - negative Renal US 12/23 - 8.5 cm kidneys, no hydro, chronic medical renal disease CXR - bilat pulm infiltrates/ edema     Impression: 1. Acute on CKD 5 - creat 6's, was in the 4's two mos ago.  Progressive renal decline, not a good HD candidate.  Pt wants to go home, however don't think this is in his best interest, he is still close to unstable and needs a few more days in the hospital to stabilize his volume status and see what is going to happen with his renal function.  Cont IV lasix for now.  Have d/w family (wife, daughter) and primary MD.   2. Chron diast CHF 3. Pulm edema - better w diuresis clinically 4. DNR - pall care consult pending 5. FTT - from uremia    Plan - as above   Kelly Splinter MD [facility] pager [phone]   08/11/2017, 9:47 AM   Recent Labs  Lab 08/08/17 0911 08/09/17 0457 08/10/17 0444  NA 132* 132* 136  K 5.5* 5.0 3.8  CL 95* 96* 98*  CO2 21* 23  22  GLUCOSE 149* 249* 174*  BUN 134* >100* 134*  CREATININE 6.70* 7.11* 6.58*  CALCIUM 8.8* 8.8* 8.8*   Recent Labs  Lab 08/08/17 0911 08/09/17 0457  AST 61* 37  ALT 42 38  ALKPHOS 146* 131*  BILITOT 0.9 0.9  PROT 6.5 6.6  ALBUMIN 3.2* 3.2*   Recent Labs  Lab 08/08/17 0911 08/09/17 0457 08/10/17 0444  WBC 5.6 6.3 8.0  NEUTROABS 3.4  --   --   HGB 10.3* 9.8* 10.3*  HCT 29.9* 28.2* 29.8*  MCV 81.9 81.3 81.6  PLT 79* 85* 88*   Iron/TIBC/Ferritin/ %Sat    Component Value Date/Time   IRON 30 (L) 07/21/2017 0512   TIBC 253 07/21/2017 0512   FERRITIN 241 07/21/2017 0512   IRONPCTSAT 12 (L) 07/21/2017 1950

## 2017-08-12 ENCOUNTER — Inpatient Hospital Stay (HOSPITAL_COMMUNITY): Payer: Medicare Other

## 2017-08-12 DIAGNOSIS — N184 Chronic kidney disease, stage 4 (severe): Secondary | ICD-10-CM

## 2017-08-12 DIAGNOSIS — N179 Acute kidney failure, unspecified: Secondary | ICD-10-CM

## 2017-08-12 DIAGNOSIS — Z09 Encounter for follow-up examination after completed treatment for conditions other than malignant neoplasm: Secondary | ICD-10-CM

## 2017-08-12 DIAGNOSIS — Z515 Encounter for palliative care: Secondary | ICD-10-CM

## 2017-08-12 DIAGNOSIS — Z7189 Other specified counseling: Secondary | ICD-10-CM

## 2017-08-12 LAB — BASIC METABOLIC PANEL
ANION GAP: 12 (ref 5–15)
BUN: 141 mg/dL — AB (ref 6–20)
CHLORIDE: 100 mmol/L — AB (ref 101–111)
CO2: 28 mmol/L (ref 22–32)
Calcium: 8.7 mg/dL — ABNORMAL LOW (ref 8.9–10.3)
Creatinine, Ser: 5.83 mg/dL — ABNORMAL HIGH (ref 0.61–1.24)
GFR, EST AFRICAN AMERICAN: 8 mL/min — AB (ref >60)
GFR, EST NON AFRICAN AMERICAN: 7 mL/min — AB (ref >60)
Glucose, Bld: 133 mg/dL — ABNORMAL HIGH (ref 65–99)
POTASSIUM: 3.5 mmol/L (ref 3.5–5.1)
SODIUM: 140 mmol/L (ref 135–145)

## 2017-08-12 MED ORDER — DEXTROSE 5 % IV SOLN
160.0000 mg | Freq: Three times a day (TID) | INTRAVENOUS | Status: DC
  Administered 2017-08-12 – 2017-08-14 (×6): 160 mg via INTRAVENOUS
  Filled 2017-08-12: qty 4
  Filled 2017-08-12 (×2): qty 10
  Filled 2017-08-12: qty 16
  Filled 2017-08-12: qty 10
  Filled 2017-08-12 (×2): qty 16
  Filled 2017-08-12 (×2): qty 10

## 2017-08-12 NOTE — Progress Notes (Signed)
Palliative Care has a family meeting set up for 12/27. I spoke with [facility] liaison Margaretmary Eddy re referral for hospice- family needs goals of care discussion to determine best services because they do not appear to understand his current condition, prognosis and anticipated trajectory. He lives with his wife at [facility] and has said he does not want to go back to [place] has been helping in the home at [facility] -but he is currently so ill hospice would be much better equiped to care for him. By report they have not wanted providers to contact their daughter or involve [facility] in their decisions.Concern for cognition and capacity.  PC will try to sort through the above issues and determine their goals and what is possible for his care. Full consult will follow on 12/27.  Lane Hacker, DO Palliative Medicine [phone]  No Charge Encounter.

## 2017-08-12 NOTE — Consult Note (Signed)
Consultation Note Date: 08/12/2017   Patient Name: Johnny Morrow  DOB: [birth year]  MRN: [id number]  Age / Sex: 83 y.o., male  PCP: Wenda Low, MD Referring Physician: Kayleen Memos, DO  Reason for Consultation: Establishing goals of care  HPI/Patient Profile: 83 y.o. male  with past medical history of diastolic heart failure with EF 65-70% (2017), CKD stage IV, chronic normocytic anemia, chronic a-fib not anticoagulated due to prior retroperitoneal hemorrhage, recent HCAP completed antibiotics 07/28/17 admitted on 08/08/2017 with worsening SOB. Being treated this admission for HCAP, acute on chronic worsening renal failure, acute on chronic heart failure. Prognosis noted to be very poor mostly due to declining renal function. Palliative care requested to assist with GOC.   Clinical Assessment and Goals of Care: I met today with Johnny Morrow who is still fluid overloaded and congested and somewhat difficult to understand. We were discussing palliative care and my role when his daughter phoned the room - Johnny Morrow answered and gave the phone to me. Johnny Morrow daughter is a doctor and begins to tell me that she is concerned about her father leaving the hospital too early (and having to come right back to the hospital), worried about her parents having enough help at home, and worried about his respiratory needs after discharge. I will address some of her concerns when I meet with her parents together as she requests assistance with convincing them to hire more help (they have 4 hours daily but daughter believes they need 8 hours). I also explained briefly the role of palliative care. Daughter believes that he has weeks "hopefully longer." She said her goal for him is QOL but also to help him live longer. Very clear about her desire to optimize his health prior to discharge.   I called and spoke with Mrs. Falzone as well.  Mrs. Sitzer seems a little confused on the services they plan to have if he is able to discharge. I have read in the chart hospice care but daughter and wife talk about [facility] home care. There seems to be a great difference in the plan documented in chart for optimizing health and pursuing hospice and family expectations from my brief conversations. For this reason I will meet with Johnny Morrow and his wife tomorrow morning to further discuss GOC, options, and plan.   Primary Decision Maker Next of kin wife    SUMMARY OF RECOMMENDATIONS   Will meet in the morning with patient and wife to further discuss [place]  Code Status/Advance Care Planning:  DNR - did not discuss   Symptom Management:   Renal following for recommendations to optimize renal function  Worry about respiratory status worsening at any time  Palliative Prophylaxis:   Aspiration, Delirium Protocol and Oral Care  Additional Recommendations (Limitations, Scope, Preferences):  Full Scope Treatment  Psycho-social/Spiritual:   Desire for further Chaplaincy support:yes  Additional Recommendations: Caregiving  Support/Resources and Education on Hospice  Prognosis:   Likely weeks or less  Discharge Planning: To Be Determined  Primary Diagnoses: Present on Admission: . HCAP (healthcare-associated pneumonia)   I have reviewed the medical record, interviewed the patient and family, and examined the patient. The following aspects are pertinent.  Past Medical History:  Diagnosis Date  . Anemia   . Arthritis   . Cataract    "ready to come out on both sides; anytime" (06/16/2017)  . Chronic diastolic (congestive) heart failure ([place])   . CKD (chronic kidney disease), stage III ([place])    "followed by Dr. Keturah Barre @ [facility]; CKD fluctuates between III and IV" (06/16/2017)  . DVT (deep venous thrombosis) (HCC)    LLE  . History of blood transfusion ~ 01/2017; 05/2017   "blood loss; anemia"  . Hypertension   . Permanent  atrial fibrillation (H[facility])    atrial fibrillation /atrial flutter - not a candidate for anticoagulation due to advanced age  . Pneumonia 12/2016   "a touch"  . Prostate cancer [facility])     follows with hematologists q 6 months. S/p radical prostatectomy and proton therapy in 2001.  . Radiation cystitis   . Retinal hemorrhage of both eyes    follows with ophthalmologists; "never had OR" (06/16/2017)  . Retroperitoneal bleed ?12/2016   [name] 06/15/2017   Social History   Socioeconomic History  . Marital status: Married    Spouse name: None  . Number of children: None  . Years of education: None  . Highest education level: None  Social Needs  . Financial resource strain: None  . Food insecurity - worry: None  . Food insecurity - inability: None  . Transportation needs - medical: None  . Transportation needs - non-medical: None  Occupational History  . None  Tobacco Use  . Smoking status: Former Smoker    Years: 10.00    Types: Cigarettes    Last attempt to quit: 1947    Years since quitting: 72.0  . Smokeless tobacco: Never Used  Substance and Sexual Activity  . Alcohol use: Yes    Alcohol/week: 0.6 oz    Types: 1 Glasses of wine per week  . Drug use: No  . Sexual activity: No  Other Topics Concern  . None  Social History Narrative  . None   Family History  Problem Relation Age of Onset  . COPD Brother   . Heart attack Brother   . Cancer Father   . Heart attack Father   . Diabetes Mother    Scheduled Meds: . ceFEPime (MAXIPIME) IV  500 mg Intravenous Q24H  . diltiazem  360 mg Oral Daily  . ferrous sulfate  325 mg Oral Q breakfast  . ipratropium-albuterol  3 mL Nebulization TID  . isosorbide mononitrate  30 mg Oral Daily  . mouth rinse  15 mL Mouth Rinse BID  . metoprolol tartrate  12.5 mg Oral BID   Continuous Infusions: . furosemide Stopped (08/12/17 0529)  . metronidazole Stopped (08/12/17 0528)   PRN Meds:. Allergies  Allergen Reactions  . Iodinated  Diagnostic Agents Rash  . Ace Inhibitors Other (See Comments)    Noted (on MAR) to be "allergic," but no reaction was cited/noted  . Shellfish-Derived Products Other (See Comments)    Noted (on MAR) to be "allergic," but no reaction was cited/noted  . Penicillins Rash    Has patient had a PCN reaction causing immediate rash, facial/tongue/throat swelling, SOB or lightheadedness with hypotension: Yes Has patient had a PCN reaction causing severe rash involving mucus membranes or skin necrosis: Unknown Has patient had a  PCN reaction that required hospitalization: Unknown Has patient had a PCN reaction occurring within the last 10 years: Unknown If all of the above answers are "NO", then may proceed with Cephalosporin use.    Review of Systems  Constitutional: Positive for activity change.  Respiratory: Positive for cough.   Neurological: Positive for weakness.    Physical Exam  Constitutional: He is oriented to person, place, and time. He appears well-developed. He has a sickly appearance.  Thin, frail, elderly  HENT:  Head: Normocephalic and atraumatic.  Cardiovascular: An irregularly irregular rhythm present. Tachycardia present.  Pulmonary/Chest: No accessory muscle usage. No tachypnea. No respiratory distress. He has rhonchi. He has rales.  Abdominal: Soft. Normal appearance.  Neurological: He is alert and oriented to person, place, and time.  Difficult to understand at times  Nursing note and vitals reviewed.   Vital Signs: BP 135/83 (BP Location: Right Arm)   Pulse 80   Temp 97.6 F (36.4 C) (Oral)   Resp 20   Ht _0  (1.651 m)   Wt 55.6 kg (122 lb 9.2 oz)   SpO2 92%   BMI 20.40 kg/m  Pain Assessment: 0-10   Pain Score: 0-No pain   SpO2: SpO2: 92 % O2 Device:SpO2: 92 % O2 Flow Rate: .O2 Flow Rate (L/min): 2 L/min  IO: Intake/output summary:   Intake/Output Summary (Last 24 hours) at 08/12/2017 1103 Last data filed at 08/12/2017 0900 Gross per 24 hour    Intake 692 ml  Output 2100 ml  Net -1408 ml    LBM: Last BM Date: 08/10/17 Baseline Weight: Weight: 58.9 kg (129 lb 12.8 oz) Most recent weight: Weight: 55.6 kg (122 lb 9.2 oz)     Palliative Assessment/Data: 30%    Time Total: 60 min  Greater than 50%  of this time was spent counseling and coordinating care related to the above assessment and plan.  Signed by: Vinie Sill, NP Palliative Medicine Team Pager # [phone] (M-F 8a-5p) Team Phone # [phone] (Nights/Weekends)

## 2017-08-12 NOTE — Progress Notes (Signed)
   08/12/17 1444  Clinical Encounter Type  Visited With Patient  Visit Type Initial  Spiritual Encounters  Spiritual Needs Emotional;Prayer  Stress Factors  Patient Stress Factors Health changes   Rounding on patients on the Palliative list.  Patient welcomed me in and was alone.  He expressed his frustration over being in and out of the hospital and hopes the pneumonia will clear up soon.  Stated there is a plan in place to go back to [facility] with Hospice care.  He expressed his wishes to get home soon.  He seems content with the plan in place.  We prayed together.   Will follow as needed. Chaplain Katherene Ponto

## 2017-08-12 NOTE — Progress Notes (Signed)
PROGRESS NOTE  Johnny Morrow WIO:[id number] DOB: 08-Feb-1920 DOA: 08/08/2017 PCP: Wenda Low, MD  HPI/Recap of past 24 hours: Johnny Morrow is a 83 y.o. male with medical history significant for chronic heart failure with preserved EF 65-70% (2017), CKD stage IV, chronic normocytic anemia, chronic a-fib not anticoagulated due to prior retroperitoneal hemorrhage, recent HCAP completed antibiotics 07/28/17 presents with complaints of progressively worsening dyspnea. He is alert and oriented x 3. Reports not on O2 supplement in the SNF. Admits to productive cough and dyspnea on exertion. Denies chest pain, chills, or fever.  Responding well clinically to diuresis with good urine output. Nephrology following and we appreciate recommendations. Will continue current management.  Seen and examined at bedside. Breathing is improving. O2 sat at least 90% on RA.  Assessment/Plan: Active Problems:   HCAP (healthcare-associated pneumonia)  Acute hypoxic respiratory failure most likely 2/2 to HCAP, poa vs acute on chronic heart failure , improving -last echo in 2017 LV EF 65-70% -repeated echo 08/09/17 revealed LVEF 50-55% -strict I&Os -BNP 700's -daily weights -O2 supplementation to maintain O2 saturation 92% or greater -lasix IV per nephrology -duonebs TID -continuous pulse oxymetry  HCAP, poa, improving -management as stated above -sputum cx, blood cx x 2 no growth 3 days -MRSA negative -duonebs TID -IV flagyl and IV cefepime day #3 -stopped IV vancomycin 08/09/17  Acute on chronic heart failure with preserved EF 50-55% -management as stated above -TTE ordered; last echo in 2017 -TTE 08/09/17 LVEF 50-55% -passed swallow eval -restarted po cardizem and po beta blocker  Aflutter -Improved after restarting meds -po diltiazem and po lopressor  AKI on CKD stage V -baseline cr 4.5 -cr 5.83 from 5.72 from 6.58 -azotemia with BUN 141 (08/12/17) -avoid nephrotoxic agents and  hypotension -nephrology following -we appreciate recommendations  Azotemia 2/2 to worsening renal function/uremia -BUN >100 -cr 5.83 from 5.72 -No altered mental status -no overt bleeding -BMP am -nephrology following  Acute Hyponatremia most likely dilutional, resolved -fluid restriction -monitor urine output  -Na+ 140 from 136 from 132 -BMP am  Chronic thrombocytopenia, improving -plt 88k from 79 k -baseline plt 90 k -no sign of overt bleeding -cbc am   Code Status: DNR  Family Communication: No family member at bedside  Disposition Plan: will stay another midnight to continue IV antibiotics and diuresis    Consultants:  nephrology  Procedures:  none  Antimicrobials:  Iv cefepime and IV flagyl day #3 for HCAP, poa  DVT prophylaxis:  SCDs   Objective: Vitals:   08/11/17 1700 08/11/17 2008 08/11/17 2100 08/12/17 0411  BP:  (!) 145/96  135/83  Pulse:  97  80  Resp:  20  20  Temp:  98 F (36.7 C)  97.6 F (36.4 C)  TempSrc:  Oral  Oral  SpO2: 95% 96% 92% 95%  Weight:    55.6 kg (122 lb 9.2 oz)  Height:        Intake/Output Summary (Last 24 hours) at 08/12/2017 0749 Last data filed at 08/12/2017 0431 Gross per 24 hour  Intake 692 ml  Output 2350 ml  Net -1658 ml   Filed Weights   08/10/17 0500 08/11/17 0434 08/12/17 0411  Weight: 59.4 kg (131 lb) 56.8 kg (125 lb 3.2 oz) 55.6 kg (122 lb 9.2 oz)    Exam:   General:  83 yo AAM thin built on [place]. Alert and oriented x 3  Eyes: pupils are round and reactive to light   ENT: Mucous membrane are moist no  erythema or exudates  Neck: No JVD or thyromegaly   Cardiovascular: RRR no rubs or gallops  Respiratory: Diffused but improved rhonchi bilaterally. No wheezes noted.  Abdomen: soft NT ND NBS x4  Skin: No noted open lesions  Musculoskeletal: Non focal. No LE edema  Psychiatric: Mood is appropriate for condition and setting  Neurologic: Alert and oriented x 3. No acute focal  neurological deficits   Data Reviewed: CBC: Recent Labs  Lab 08/08/17 0911 08/09/17 0457 08/10/17 0444  WBC 5.6 6.3 8.0  NEUTROABS 3.4  --   --   HGB 10.3* 9.8* 10.3*  HCT 29.9* 28.2* 29.8*  MCV 81.9 81.3 81.6  PLT 79* 85* 88*   Basic Metabolic Panel: Recent Labs  Lab 08/08/17 0911 08/09/17 0457 08/10/17 0444 08/11/17 0913 08/12/17 0505  NA 132* 132* 136 139 140  K 5.5* 5.0 3.8 3.8 3.5  CL 95* 96* 98* 100* 100*  CO2 21* 23 22 27 28   GLUCOSE 149* 249* 174* 143* 133*  BUN 134* >100* 134* 147* 141*  CREATININE 6.70* 7.11* 6.58* 5.72* 5.83*  CALCIUM 8.8* 8.8* 8.8* 8.7* 8.7*   GFR: Estimated Creatinine Clearance: 6 mL/min (A) (by C-G formula based on SCr of 5.83 mg/dL (H)). Liver Function Tests: Recent Labs  Lab 08/08/17 0911 08/09/17 0457  AST 61* 37  ALT 42 38  ALKPHOS 146* 131*  BILITOT 0.9 0.9  PROT 6.5 6.6  ALBUMIN 3.2* 3.2*   No results for input(s): LIPASE, AMYLASE in the last 168 hours. No results for input(s): AMMONIA in the last 168 hours. Coagulation Profile: No results for input(s): INR, PROTIME in the last 168 hours. Cardiac Enzymes: No results for input(s): CKTOTAL, CKMB, CKMBINDEX, TROPONINI in the last 168 hours. BNP (last 3 results) Recent Labs    08/20/16 1221 12/22/16 1453  PROBNP 373.0* 999.0*   HbA1C: No results for input(s): HGBA1C in the last 72 hours. CBG: No results for input(s): GLUCAP in the last 168 hours. Lipid Profile: No results for input(s): CHOL, HDL, LDLCALC, TRIG, CHOLHDL, LDLDIRECT in the last 72 hours. Thyroid Function Tests: No results for input(s): TSH, T4TOTAL, FREET4, T3FREE, THYROIDAB in the last 72 hours. Anemia Panel: No results for input(s): VITAMINB12, FOLATE, FERRITIN, TIBC, IRON, RETICCTPCT in the last 72 hours. Urine analysis:    Component Value Date/Time   COLORURINE YELLOW 06/07/2017 [place] 06/07/2017 1313   LABSPEC 1.013 06/07/2017 1313   PHURINE 5.0 06/07/2017 1313    GLUCOSEU NEGATIVE 06/07/2017 1313   HGBUR NEGATIVE 06/07/2017 1313   BILIRUBINUR NEGATIVE 06/07/2017 1313   KETONESUR NEGATIVE 06/07/2017 1313   PROTEINUR 100 (A) 06/07/2017 1313   NITRITE NEGATIVE 06/07/2017 1313   LEUKOCYTESUR NEGATIVE 06/07/2017 1313   Sepsis Labs: @LABRCNTIP (procalcitonin:4,lacticidven:4)  ) Recent Results (from the past 240 hour(s))  Culture, blood (Routine X 2) w Reflex to ID Panel     Status: None (Preliminary result)   Collection Time: 08/08/17  9:11 AM  Result Value Ref Range Status   Specimen Description BLOOD BLOOD LEFT HAND  Final   Special Requests   Final    BOTTLES DRAWN AEROBIC AND ANAEROBIC Blood Culture adequate volume   Culture   Final    NO GROWTH 3 DAYS Performed at [facility], [place] [address] [place], [place] [address]    Report Status PENDING  Incomplete  Culture, blood (Routine X 2) w Reflex to ID Panel     Status: None (Preliminary result)   Collection Time: 08/08/17  9:16 AM  Result Value Ref Range Status   Specimen Description BLOOD BLOOD LEFT ARM  Final   Special Requests   Final    BOTTLES DRAWN AEROBIC AND ANAEROBIC Blood Culture adequate volume   Culture   Final    NO GROWTH 3 DAYS Performed at [facility], [address], [place], [place] [address]    Report Status PENDING  Incomplete      Studies: No results found.  Scheduled Meds: . ceFEPime (MAXIPIME) IV  500 mg Intravenous Q24H  . diltiazem  360 mg Oral Daily  . ferrous sulfate  325 mg Oral Q breakfast  . ipratropium-albuterol  3 mL Nebulization TID  . isosorbide mononitrate  30 mg Oral Daily  . mouth rinse  15 mL Mouth Rinse BID  . metoprolol tartrate  12.5 mg Oral BID    Continuous Infusions: . furosemide Stopped (08/12/17 0529)  . metronidazole Stopped (08/12/17 0528)     LOS: 4 days     Kayleen Memos, MD [facility] Pager [phone]  If 7PM-7AM, please contact night-coverage [email or web address] Password [facility] 08/12/2017, 7:49  AM

## 2017-08-12 NOTE — Progress Notes (Signed)
Pharmacy Antibiotic Note  Johnny Morrow is a 83 y.o. male admitted on 08/08/2017 with pneumonia.  Pharmacy has been consulted for Cefepime dosing. Flagyl added for suspected aspiration PNA.  Blood cultures ngtd.  Plan: 1) Continue cefepime to 500mg  IV q24 for CrCl < 10 ml/min 2) Flagyl 500mg  IV q8  Height: 5\' 5"  (165.1 cm) Weight: 122 lb 9.2 oz (55.6 kg) IBW/kg (Calculated) : 61.5  Temp (24hrs), Avg:97.8 F (36.6 C), Min:97.6 F (36.4 C), Max:98 F (36.7 C)  Recent Labs  Lab 08/08/17 0911 08/08/17 0927 08/08/17 1339 08/09/17 0457 08/10/17 0444 08/11/17 0913 08/12/17 0505  WBC 5.6  --   --  6.3 8.0  --   --   CREATININE 6.70*  --   --  7.11* 6.58* 5.72* 5.83*  LATICACIDVEN  --  2.01* 3.6*  --   --   --   --     Estimated Creatinine Clearance: 6 mL/min (A) (by C-G formula based on SCr of 5.83 mg/dL (H)).    Allergies  Allergen Reactions  . Iodinated Diagnostic Agents Rash  . Ace Inhibitors Other (See Comments)    Noted (on MAR) to be "allergic," but no reaction was cited/noted  . Shellfish-Derived Products Other (See Comments)    Noted (on MAR) to be "allergic," but no reaction was cited/noted  . Penicillins Rash    Has patient had a PCN reaction causing immediate rash, facial/tongue/throat swelling, SOB or lightheadedness with hypotension: Yes Has patient had a PCN reaction causing severe rash involving mucus membranes or skin necrosis: Unknown Has patient had a PCN reaction that required hospitalization: Unknown Has patient had a PCN reaction occurring within the last 10 years: Unknown If all of the above answers are "NO", then may proceed with Cephalosporin use.    Thank you for allowing pharmacy to be a part of this patient's care.   Hershal Coria, PharmD, BCPS Pager: [phone] 08/12/2017 8:22 AM

## 2017-08-12 NOTE — Progress Notes (Signed)
1445---[facility]--[facility] RN Note  Spoke with Sunday Spillers, [facility] regarding referral received 08/11/17 for hospice care in the home. Vinnie Level shares that the patient is insistent that he is going home with home care with [facility] and will call hospice if and when it is felt it is needed.  At this time we will not contact the family or patient. If we can be of any further assistance, please feel free to call [phone].  Thank you, Margaretmary Eddy, RN, [facility] Liaison [place] are on AMION.

## 2017-08-12 NOTE — Progress Notes (Signed)
[facility] Progress Note  Subjective: voided 2.4 L UOP yesterday on IV lasix .    Vitals:   08/11/17 2008 08/11/17 2100 08/12/17 0411 08/12/17 0853  BP: (!) 145/96  135/83   Pulse: 97  80   Resp: 20  20   Temp: 98 F (36.7 C)  97.6 F (36.4 C)   TempSrc: Oral  Oral   SpO2: 96% 92% 95% 92%  Weight:   55.6 kg (122 lb 9.2 oz)   Height:        Inpatient medications: . ceFEPime (MAXIPIME) IV  500 mg Intravenous Q24H  . diltiazem  360 mg Oral Daily  . ferrous sulfate  325 mg Oral Q breakfast  . ipratropium-albuterol  3 mL Nebulization TID  . isosorbide mononitrate  30 mg Oral Daily  . mouth rinse  15 mL Mouth Rinse BID  . metoprolol tartrate  12.5 mg Oral BID   . furosemide Stopped (08/12/17 0529)  . metronidazole Stopped (08/12/17 3662)     Exam: Elderly AAM, coughing more, ^WOB again No jvd Chest rales 1/2 up on L, dec'd BS 1/3 up on R and rales R mid lung RRR  Abd soft ntnd Ext 2+ pitting depend hip edema bilat Condom cath NF, ox 3  Last UA 06/07/17 - negative Renal US 12/23 - 8.5 cm kidneys, no hydro, chronic medical renal disease CXR 12/26 - improving bilat edema, still R effusion     Impression: 1. Acute on CKD 5 - creat 6's, was in the 4's two mos ago.  Plan is conservative care, no dialysis. Creat / BUN no better, likely this is new baseline.  Remains vol overloaded with hip edema and pulm edema.  Looks worse today again w/ gurgling respirations again and SOB.  Will increase IV lasix to max dose.  Poor short-term prognosis in my opinion, may not make it out of the hospital. 2. Pulm edema - worse again today on exam 3. Afib, permanent 4. HTN - bp's stable 5. DNR - pall care consult pending 6. FTT - from uremia    Plan - as above   Kelly Splinter MD [place] pager [phone]   08/12/2017, 12:13 PM   Recent Labs  Lab 08/10/17 0444 08/11/17 0913 08/12/17 0505  NA 136 139 140  K 3.8 3.8 3.5  CL 98* 100* 100*  CO2 22  27 28   GLUCOSE 174* 143* 133*  BUN 134* 147* 141*  CREATININE 6.58* 5.72* 5.83*  CALCIUM 8.8* 8.7* 8.7*   Recent Labs  Lab 08/08/17 0911 08/09/17 0457  AST 61* 37  ALT 42 38  ALKPHOS 146* 131*  BILITOT 0.9 0.9  PROT 6.5 6.6  ALBUMIN 3.2* 3.2*   Recent Labs  Lab 08/08/17 0911 08/09/17 0457 08/10/17 0444  WBC 5.6 6.3 8.0  NEUTROABS 3.4  --   --   HGB 10.3* 9.8* 10.3*  HCT 29.9* 28.2* 29.8*  MCV 81.9 81.3 81.6  PLT 79* 85* 88*   Iron/TIBC/Ferritin/ %Sat    Component Value Date/Time   IRON 30 (L) 07/21/2017 0512   TIBC 253 07/21/2017 0512   FERRITIN 241 07/21/2017 0512   IRONPCTSAT 12 (L) 07/21/2017 5465

## 2017-08-13 ENCOUNTER — Inpatient Hospital Stay (HOSPITAL_COMMUNITY): Payer: Medicare Other

## 2017-08-13 ENCOUNTER — Encounter (HOSPITAL_COMMUNITY): Payer: Self-pay

## 2017-08-13 DIAGNOSIS — Z008 Encounter for other general examination: Secondary | ICD-10-CM

## 2017-08-13 DIAGNOSIS — Z515 Encounter for palliative care: Secondary | ICD-10-CM

## 2017-08-13 DIAGNOSIS — Z9889 Other specified postprocedural states: Secondary | ICD-10-CM

## 2017-08-13 DIAGNOSIS — N184 Chronic kidney disease, stage 4 (severe): Secondary | ICD-10-CM

## 2017-08-13 DIAGNOSIS — N179 Acute kidney failure, unspecified: Secondary | ICD-10-CM

## 2017-08-13 DIAGNOSIS — R0603 Acute respiratory distress: Secondary | ICD-10-CM

## 2017-08-13 DIAGNOSIS — Z7189 Other specified counseling: Secondary | ICD-10-CM

## 2017-08-13 LAB — BODY FLUID CELL COUNT WITH DIFFERENTIAL
LYMPHS FL: 23 %
Monocyte-Macrophage-Serous Fluid: 68 % (ref 50–90)
Neutrophil Count, Fluid: 9 % (ref 0–25)
Total Nucleated Cell Count, Fluid: 443 cu mm (ref 0–1000)

## 2017-08-13 LAB — LACTATE DEHYDROGENASE, PLEURAL OR PERITONEAL FLUID: LD FL: 96 U/L — AB (ref 3–23)

## 2017-08-13 LAB — GLUCOSE, PLEURAL OR PERITONEAL FLUID: Glucose, Fluid: 215 mg/dL

## 2017-08-13 LAB — GRAM STAIN

## 2017-08-13 LAB — CULTURE, BLOOD (ROUTINE X 2)
Culture: NO GROWTH
Culture: NO GROWTH
Special Requests: ADEQUATE
Special Requests: ADEQUATE

## 2017-08-13 LAB — BASIC METABOLIC PANEL
ANION GAP: 13 (ref 5–15)
BUN: 144 mg/dL — AB (ref 6–20)
CHLORIDE: 97 mmol/L — AB (ref 101–111)
CO2: 30 mmol/L (ref 22–32)
Calcium: 9.1 mg/dL (ref 8.9–10.3)
Creatinine, Ser: 5.51 mg/dL — ABNORMAL HIGH (ref 0.61–1.24)
GFR, EST AFRICAN AMERICAN: 9 mL/min — AB (ref >60)
GFR, EST NON AFRICAN AMERICAN: 8 mL/min — AB (ref >60)
Glucose, Bld: 139 mg/dL — ABNORMAL HIGH (ref 65–99)
POTASSIUM: 3.2 mmol/L — AB (ref 3.5–5.1)
SODIUM: 140 mmol/L (ref 135–145)

## 2017-08-13 LAB — PROTEIN, PLEURAL OR PERITONEAL FLUID

## 2017-08-13 LAB — ALBUMIN, PLEURAL OR PERITONEAL FLUID: Albumin, Fluid: 1.6 g/dL

## 2017-08-13 MED ORDER — POTASSIUM CHLORIDE CRYS ER 20 MEQ PO TBCR
40.0000 meq | EXTENDED_RELEASE_TABLET | Freq: Two times a day (BID) | ORAL | Status: AC
  Administered 2017-08-13 (×2): 40 meq via ORAL
  Filled 2017-08-13 (×2): qty 2

## 2017-08-13 MED ORDER — LIDOCAINE HCL 1 % IJ SOLN
INTRAMUSCULAR | Status: AC
  Filled 2017-08-13: qty 20

## 2017-08-13 MED ORDER — METOLAZONE 2.5 MG PO TABS
2.5000 mg | ORAL_TABLET | Freq: Two times a day (BID) | ORAL | Status: DC
  Administered 2017-08-13 – 2017-08-14 (×2): 2.5 mg via ORAL
  Filled 2017-08-13 (×2): qty 1

## 2017-08-13 MED ORDER — MAGNESIUM SULFATE 2 GM/50ML IV SOLN
2.0000 g | Freq: Once | INTRAVENOUS | Status: AC
  Administered 2017-08-13: 2 g via INTRAVENOUS
  Filled 2017-08-13: qty 50

## 2017-08-13 NOTE — Consult Note (Signed)
[facility] Consult   Reason for Consult:  Capacity evaluation  Referring Physician:  Dr. Nevada Crane Patient Identification: Johnny Morrow MRN:  [id number] Principal Diagnosis: Evaluation by psychiatric service required Diagnosis:   Patient Active Problem List   Diagnosis Date Noted  . Acute renal failure superimposed on stage 4 chronic kidney disease ([place]) [N17.9, N18.4]   . Goals of care, counseling/discussion [Z71.89]   . Palliative care encounter [Z51.5]   . Atrial flutter ([place]) [I48.92] 07/31/2017  . HCAP (healthcare-associated pneumonia) [J18.9] 07/19/2017  . Pneumonia [J18.9] 07/19/2017  . Tachycardia [R00.0] 06/15/2017  . Retroperitoneal bleed 5/18 [R58] 06/15/2017  . Chronic diastolic CHF (congestive heart failure) ([place]) [I50.32] 06/15/2017  . Elevated troponin [R74.8]   . Closed intertrochanteric fracture of hip, right, initial encounter ([place]) 10 /18 [S72.141A] 06/07/2017  . AF (paroxysmal atrial fibrillation) ([place]) [I48.0] 12/22/2016  . Pulmonary nodule less than 6 cm determined by computed tomography of lung [R91.1] 11/27/2016  . Normocytic anemia [D64.9] 11/27/2016  . Chronic kidney disease (CKD), stage IV (severe) ([place]) [N18.4] 08/20/2016  . Hyperlipidemia [E78.5] 08/20/2016  . Moderate tricuspid regurgitation [I07.1] 01/11/2016  . Mild aortic stenosis [I35.0] 01/11/2016  . Mild aortic regurgitation [I35.1] 01/11/2016  . Osteoporosis [M81.0] 01/11/2016  . Prediabetes [R73.03] 12/19/2015  . Essential hypertension, benign [I10] 09/25/2015  . History of prostate cancer [Z85.46] 09/25/2015  . Idiopathic scoliosis, thoracic [M41.20] 09/25/2015  . History of DVT of lower extremity, left [Z86.718] 09/25/2015    Total Time spent with patient: 1 hour  Subjective:   Johnny Morrow is a 83 y.o. male patient admitted with HCAP, acute on chronic worsening renal failure and acute on chronic heart failure.  HPI:  Per primary team, patient has CKD stage V with complications  and has been determined to best benefit from hospice care. He has been unable to sustain a choice regarding hospice care. He agrees to hospice care at times and at other times he requests medical interventions that are not the standard of care for his current condition. He remains fluid overloaded with pulmonary edema. He is not a candidate for dialysis so conservative care has been recommended with a suspected poor short term prognosis. There has been a difference in opinion between the treatment team and the patient and his wife about optimizing his health and goals of care. They do not appear to understand his current condition, prognosis and anticipated trajectory. There is a concern for cognition and capacity.   On interview, Johnny Morrow is able to list his medical conditions. He is unable to list his medications and was unable to fully explain what his primary team is currently treating. He was able to state he was taking antibiotics but did not know what the antibiotics were treating. He reports that he has kidney failure and would like to have palliative care but he would prefer to have services through a private company. He later reports that in 6 weeks he will likely be able to run up a hill and reports, "I will die when I die" when asked about his prognosis. He denies problems with anxiety, depression, sleep or appetite. He denies SI, HI or AVH.   Past Psychiatric History: Denies  Risk to Self: Is patient at risk for suicide?: No Risk to Others:  None. Denies HI.  Prior Inpatient Therapy:  Denies Prior Outpatient Therapy:  Denies  Past Medical History:  Past Medical History:  Diagnosis Date  . Anemia   . Arthritis   . Cataract    "  ready to come out on both sides; anytime" (06/16/2017)  . Chronic diastolic (congestive) heart failure ([place])   . CKD (chronic kidney disease), stage III ([place])    "followed by Dr. Keturah Barre @ [facility]; CKD fluctuates between III and IV" (06/16/2017)  . DVT (deep venous  thrombosis) ([facility])    LLE  . History of blood transfusion ~ 01/2017; 05/2017   "blood loss; anemia"  . Hypertension   . Permanent atrial fibrillation ([facility])    atrial fibrillation /atrial flutter - not a candidate for anticoagulation due to advanced age  . Pneumonia 12/2016   "a touch"  . Prostate cancer [facility])     follows with hematologists q 6 months. S/p radical prostatectomy and proton therapy in 2001.  . Radiation cystitis   . Retinal hemorrhage of both eyes    follows with ophthalmologists; "never had OR" (06/16/2017)  . Retroperitoneal bleed ?12/2016   [name] 06/15/2017    Past Surgical History:  Procedure Laterality Date  . FRACTURE SURGERY    . INGUINAL HERNIA REPAIR    . INTRAMEDULLARY (IM) NAIL INTERTROCHANTERIC Right 06/07/2017   Procedure: INTRAMEDULLARY (IM) NAIL INTERTROCHANTRIC;  Surgeon: [name], MD;  Location: [place];  Service: Orthopedics;  Laterality: Right;  . PROSTATECTOMY    . TIBIA FRACTURE SURGERY Left 06/2012  . TONSILLECTOMY     Family History:  Family History  Problem Relation Age of Onset  . COPD Brother   . Heart attack Brother   . Cancer Father   . Heart attack Father   . Diabetes Mother    Family Psychiatric  History: Denies Social History:  Social History   Substance and Sexual Activity  Alcohol Use Yes  . Alcohol/week: 0.6 oz  . Types: 1 Glasses of wine per week     Social History   Substance and Sexual Activity  Drug Use No    Social History   Socioeconomic History  . Marital status: Married    Spouse name: None  . Number of children: None  . Years of education: None  . Highest education level: None  Social Needs  . Financial resource strain: None  . Food insecurity - worry: None  . Food insecurity - inability: None  . Transportation needs - medical: None  . Transportation needs - non-medical: None  Occupational History  . None  Tobacco Use  . Smoking status: Former Smoker    Years: 10.00    Types: Cigarettes     Last attempt to quit: 1947    Years since quitting: 72.0  . Smokeless tobacco: Never Used  Substance and Sexual Activity  . Alcohol use: Yes    Alcohol/week: 0.6 oz    Types: 1 Glasses of wine per week  . Drug use: No  . Sexual activity: No  Other Topics Concern  . None  Social History Narrative  . None   Additional Social History: He is married to his wife of 20 years. He has 2 children. 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 He is from [place]. He reports drinking an occasional glass of wine. He denies illicit substance use or tobacco use. He quit smoking in 1947.     Allergies:   Allergies  Allergen Reactions  . Iodinated Diagnostic Agents Rash  . Ace Inhibitors Other (See Comments)    Noted (on MAR) to be "allergic," but no reaction was cited/noted  . Shellfish-Derived Products Other (See Comments)    Noted (on MAR) to be "allergic,"  but no reaction was cited/noted  . Penicillins Rash    Has patient had a PCN reaction causing immediate rash, facial/tongue/throat swelling, SOB or lightheadedness with hypotension: Yes Has patient had a PCN reaction causing severe rash involving mucus membranes or skin necrosis: Unknown Has patient had a PCN reaction that required hospitalization: Unknown Has patient had a PCN reaction occurring within the last 10 years: Unknown If all of the above answers are "NO", then may proceed with Cephalosporin use.     Labs:  Results for orders placed or performed during the hospital encounter of 08/08/17 (from the past 48 hour(s))  Basic metabolic panel     Status: Abnormal   Collection Time: 08/12/17  5:05 AM  Result Value Ref Range   Sodium 140 135 - 145 mmol/L   Potassium 3.5 3.5 - 5.1 mmol/L   Chloride 100 (L) 101 - 111 mmol/L   CO2 28 22 - 32 mmol/L   Glucose, Bld 133 (H) 65 - 99 mg/dL   BUN 141 (H) 6 - 20 mg/dL    Comment: RESULTS CONFIRMED BY MANUAL DILUTION   Creatinine, Ser 5.83 (H) 0.61 - 1.24 mg/dL   Calcium  8.7 (L) 8.9 - 10.3 mg/dL   GFR calc non Af Amer 7 (L) >60 mL/min   GFR calc Af Amer 8 (L) >60 mL/min    Comment: (NOTE) The eGFR has been calculated using the CKD EPI equation. This calculation has not been validated in all clinical situations. eGFR's persistently <60 mL/min signify possible Chronic Kidney Disease.    Anion gap 12 5 - 15  Basic metabolic panel     Status: Abnormal   Collection Time: 08/13/17  4:26 AM  Result Value Ref Range   Sodium 140 135 - 145 mmol/L   Potassium 3.2 (L) 3.5 - 5.1 mmol/L   Chloride 97 (L) 101 - 111 mmol/L   CO2 30 22 - 32 mmol/L   Glucose, Bld 139 (H) 65 - 99 mg/dL   BUN 144 (H) 6 - 20 mg/dL    Comment: RESULTS CONFIRMED BY MANUAL DILUTION   Creatinine, Ser 5.51 (H) 0.61 - 1.24 mg/dL   Calcium 9.1 8.9 - 10.3 mg/dL   GFR calc non Af Amer 8 (L) >60 mL/min   GFR calc Af Amer 9 (L) >60 mL/min    Comment: (NOTE) The eGFR has been calculated using the CKD EPI equation. This calculation has not been validated in all clinical situations. eGFR's persistently <60 mL/min signify possible Chronic Kidney Disease.    Anion gap 13 5 - 15    Current Facility-Administered Medications  Medication Dose Route Frequency Provider Last Rate Last Dose  . ceFEPIme (MAXIPIME) 500 mg in dextrose 5 % 50 mL IVPB  500 mg Intravenous Q24H Irene Pap N, DO   500 mg at 08/12/17 1207  . diltiazem (CARDIZEM CD) 24 hr capsule 360 mg  360 mg Oral Daily Irene Pap N, DO   360 mg at 08/13/17 1007  . ferrous sulfate tablet 325 mg  325 mg Oral Q breakfast Irene Pap N, DO   325 mg at 08/13/17 1007  . furosemide (LASIX) 160 mg in dextrose 5 % 50 mL IVPB  160 mg Intravenous Q8H Roney Jaffe, MD   Stopped at 08/13/17 [phone]  . ipratropium-albuterol (DUONEB) 0.5-2.5 (3) MG/3ML nebulizer solution 3 mL  3 mL Nebulization TID Irene Pap N, DO   3 mL at 08/12/17 1926  . isosorbide mononitrate (IMDUR) 24 hr tablet 30 mg  30 mg Oral Daily Irene Pap N, DO   30 mg at 08/13/17 1006   . magnesium sulfate IVPB 2 g 50 mL  2 g Intravenous Once Irene Pap N, DO      . MEDLINE mouth rinse  15 mL Mouth Rinse BID Hall, Carole N, DO   15 mL at 08/13/17 1017  . metoprolol tartrate (LOPRESSOR) tablet 12.5 mg  12.5 mg Oral BID Irene Pap N, DO   12.5 mg at 08/13/17 1008  . metroNIDAZOLE (FLAGYL) IVPB 500 mg  500 mg Intravenous Q8H Hall, Carole N, DO   Stopped at 08/13/17 1018  . potassium chloride SA (K-DUR,KLOR-CON) CR tablet 40 mEq  40 mEq Oral BID Irene Pap N, DO   40 mEq at 08/13/17 1006    Musculoskeletal: Strength & Muscle Tone: decreased due to physical deconditioning.  Gait & Station: UTA due to patient lying in bed.  Patient leans: N/A  Psychiatric Specialty Exam: Physical Exam  Nursing note and vitals reviewed. Constitutional: He is oriented to person, place, and time. He appears well-developed and well-nourished.  HENT:  Head: Normocephalic and atraumatic.  Neck: Normal range of motion.  Respiratory: Effort normal.  Musculoskeletal: Normal range of motion.  Neurological: He is alert and oriented to person, place, and time.  Skin: No rash noted.  Psychiatric: He has a normal mood and affect. His speech is normal and behavior is normal. Judgment and thought content normal. Cognition and memory are normal.    Review of Systems  Constitutional: Negative for chills and fever.  Respiratory: Positive for shortness of breath.   Cardiovascular: Negative for chest pain.  Gastrointestinal: Negative for abdominal pain, diarrhea, nausea and vomiting.    Blood pressure (!) 126/91, pulse 65, temperature 97.8 F (36.6 C), temperature source Oral, resp. rate 18, height _0  (1.651 m), weight 52.8 kg (116 lb 4.8 oz), SpO2 93 %.Body mass index is 19.35 kg/m.  General Appearance: Well Groomed, elderly, African American male who has curly thinning hair, wearing a hospital gown and lying in bed. NAD.   Eye Contact:  Good  Speech:  Clear and Coherent and Normal Rate   Volume:  Normal  Mood:  "Better"  Affect:  Appropriate and Congruent  Thought Process:  Goal Directed and Linear  Orientation:  Full (Time, Place, and Person)  Thought Content:  Logical  Suicidal Thoughts:  No  Homicidal Thoughts:  No  Memory:  Immediate;   Fair Recent;   Fair Remote;   Fair  Judgement:  Poor  Insight:  Fair  Psychomotor Activity:  Normal  Concentration:  Concentration: Good and Attention Span: Good  Recall:  [employer] of Knowledge:  Fair  Language:  Fair  Akathisia:  No  Handed:  Right  AIMS (if indicated):   N/A  Assets:  Communication Skills Housing Social Support  ADL's:  Impaired  Cognition:  WNL  Sleep:   Okay   Assessment:  Johnny Morrow is a 83 y.o. male who was admitted with HCAP, acute on chronic worsening renal failure and acute on chronic heart failure. Patient does not demonstrate capacity to make decisions regarding hospice care. He does not appear to understand the seriousness of his condition and has reportedly changed his mind numerous times about hospice care and is therefore unable to sustain a choice. His presentation may be complicated by denial of his condition or stoicism. He will require an authorized representative to help him make medications decisions regarding need for hospice care.  Treatment Plan Summary: -Patient does not demonstrate capacity to make medical decisions regarding need for hospice care. Please assign an authorized representative to help him make medical decisions regarding his current condition. Please see assessment for further details.  -Psychiatry will sign off on patient at this time. Please consult psychiatry again as needed.   Disposition: No evidence of imminent risk to self or others at present.   Patient does not meet criteria for psychiatric inpatient admission.   Faythe Dingwall, DO 08/13/2017 11:02 AM

## 2017-08-13 NOTE — Progress Notes (Addendum)
[facility] Progress Note  Subjective: voided 2 L yest on IV lasix 160 tid. No new c/o's, hard to understand.  On RA.     Vitals:   08/13/17 1402 08/13/17 1405 08/13/17 1436 08/13/17 1500  BP: (!) 125/91  114/83 (!) 124/95  Pulse: 74     Resp: 18     Temp: 97.7 F (36.5 C)     TempSrc: Oral     SpO2: 92% 94%    Weight:      Height:        Inpatient medications: . ceFEPime (MAXIPIME) IV  500 mg Intravenous Q24H  . diltiazem  360 mg Oral Daily  . ferrous sulfate  325 mg Oral Q breakfast  . ipratropium-albuterol  3 mL Nebulization TID  . isosorbide mononitrate  30 mg Oral Daily  . lidocaine      . mouth rinse  15 mL Mouth Rinse BID  . metoprolol tartrate  12.5 mg Oral BID  . potassium chloride  40 mEq Oral BID   . furosemide Stopped (08/13/17 4481)  . metronidazole 500 mg (08/13/17 1517)     Exam: Elderly AAM, less coughing, still gurgling respirations noted No jvd Chest no change bibasilar rales, no wheezing RRR  Abd soft ntnd Ext 1- 2+ pitting depend hip edema bilat, a little better Condom cath NF, ox 3  Last UA 06/07/17 - negative Renal US 12/23 - 8.5 cm kidneys, no hydro, chronic medical renal disease CXR 12/26 - improving bilat edema, still R effusion     Impression: 1. Acute on CKD 5 - baseline creat 3.5- 4.5 from 2 mos ago. Plan is conservative care, no dialysis. Wt's down 6kg from peak here w/ high dose IV lasix.  Still wet on exam. Drowsy which may be uremia. Fully oriented however.  Still not stable for dc.  Some improvement in creat down to 5.5 today (7.1 on admission).  Cont IV lasix, add po zarox 2.5 bid.  2. Pulm edema - cont lasix IV 160 tid  3. Afib, permanent 4. HTN - bp's stable 5. DNR - pall care meeting w/ family and pt   Plan - as above   Kelly Splinter MD [facility] pager [phone]   08/13/2017, 4:15 PM   Recent Labs  Lab 08/11/17 0913 08/12/17 0505 08/13/17 0426  NA 139 140 140  K 3.8 3.5 3.2*  CL  100* 100* 97*  CO2 27 28 30   GLUCOSE 143* 133* 139*  BUN 147* 141* 144*  CREATININE 5.72* 5.83* 5.51*  CALCIUM 8.7* 8.7* 9.1   Recent Labs  Lab 08/08/17 0911 08/09/17 0457  AST 61* 37  ALT 42 38  ALKPHOS 146* 131*  BILITOT 0.9 0.9  PROT 6.5 6.6  ALBUMIN 3.2* 3.2*   Recent Labs  Lab 08/08/17 0911 08/09/17 0457 08/10/17 0444  WBC 5.6 6.3 8.0  NEUTROABS 3.4  --   --   HGB 10.3* 9.8* 10.3*  HCT 29.9* 28.2* 29.8*  MCV 81.9 81.3 81.6  PLT 79* 85* 88*   Iron/TIBC/Ferritin/ %Sat    Component Value Date/Time   IRON 30 (L) 07/21/2017 0512   TIBC 253 07/21/2017 0512   FERRITIN 241 07/21/2017 0512   IRONPCTSAT 12 (L) 07/21/2017 6378

## 2017-08-13 NOTE — Consult Note (Signed)
   [facility] CM Inpatient Consult   08/13/2017  Johnny Morrow 22-Apr-1920 [id number]     Mr. Paulsen was active with [facility] program prior to hospitalization.   Spoke with Bethena Roys with [facility] regarding PMT consult notes.   Edwina to follow up with patient/wife to discuss that [place] was initiated after last hospitalization for rehab at home. Aide services were not indefinite. It appears palliative discussion remain ongoing.  Spoke with (covering) inpatient RNCM to make aware of above notes.   Marthenia Rolling, MSN-Ed, RN,BSN [facility] Liaison [phone]

## 2017-08-13 NOTE — Procedures (Signed)
Ultrasound-guided diagnostic and therapeutic right thoracentesis performed yielding 0.35 liters of serosanguineous colored fluid. No immediate complications. Follow-up chest x-ray pending.    Johnny Morrow 3:01 PM 08/13/2017

## 2017-08-13 NOTE — Progress Notes (Signed)
PROGRESS NOTE  Johnny Morrow DVV:[id number] DOB: 02-11-1920 DOA: 08/08/2017 PCP: Wenda Low, MD  HPI/Recap of past 24 hours: Johnny Morrow is a 83 y.o. male with medical history significant for chronic heart failure with preserved EF 65-70% (2017), CKD stage IV, chronic normocytic anemia, chronic a-fib not anticoagulated due to prior retroperitoneal hemorrhage, recent HCAP completed antibiotics 07/28/17 presents with complaints of progressively worsening dyspnea. He is alert and oriented x 3. Reports not on O2 supplement in the SNF. Admits to productive cough and dyspnea on exertion. Denies chest pain, chills, or fever.  The writer informed the patient that will call his daughter patient states "no, not now." Daughter has requested to be called for update. Patient has poor overall insight of his condition. Will consult psychiatry to evaluate capacity. The patient has no capacity per psychiatry Dr. Mariea Clonts.  The writer called the patient's daughter with his permission. No answer.  Assessment/Plan: Active Problems:   HCAP (healthcare-associated pneumonia)  Acute hypoxic respiratory failure most likely 2/2 to HCAP, poa vs acute on chronic heart failure , improving -last echo in 2017 LV EF 65-70% -repeated echo 08/09/17 revealed LVEF 50-55% -strict I&Os -BNP 700's -daily weights -O2 supplementation to maintain O2 saturation 92% or greater -lasix IV per nephrology -duonebs TID -continuous pulse oxymetry -thoracentesis R 08/13/17  Right pleural effusion post right thoracentesis -08/13/17 -350 cc removed  HCAP, poa, improving -management as stated above -sputum cx, blood cx x 2 no growth 3 days -MRSA negative -duonebs TID -IV flagyl and IV cefepime day #4 -stopped IV vancomycin 08/09/17  Acute on chronic heart failure with preserved EF 50-55% -management as stated above -TTE ordered; last echo in 2017 -TTE 08/09/17 LVEF 50-55% -passed swallow eval -restarted po cardizem and po  beta blocker  Aflutter -Improved after restarting meds -po diltiazem and po lopressor  AKI on CKD stage V -baseline cr 4.5 -cr 5.83 from 5.72 from 6.58 -azotemia with BUN 141 (08/12/17) -avoid nephrotoxic agents and hypotension -nephrology following -we appreciate recommendations  Azotemia 2/2 to worsening renal function/uremia -BUN >100 -cr 5.83 from 5.72 -No altered mental status -no overt bleeding -BMP am -nephrology following  Acute Hyponatremia most likely dilutional, resolved -fluid restriction -monitor urine output  -Na+ 140 from 136 from 132 -BMP am  Chronic thrombocytopenia, improving -plt 88k from 79 k -baseline plt 90 k -no sign of overt bleeding -cbc am   Code Status: DNR  Family Communication: No family member at bedside  Disposition Plan: will stay another midnight to continue IV antibiotics and diuresis    Consultants:  nephrology  Procedures:  none  Antimicrobials:  Iv cefepime and IV flagyl day #3 for HCAP, poa  DVT prophylaxis:  SCDs   Objective: Vitals:   08/12/17 0853 08/12/17 1300 08/12/17 2129 08/13/17 0426  BP:  (!) 143/96 138/87 (!) 126/91  Pulse:  100 (!) 127 65  Resp:  20 20 18   Temp:  98 F (36.7 C) 98.1 F (36.7 C) 97.8 F (36.6 C)  TempSrc:  Oral Oral Oral  SpO2: 92% 95% 96% 93%  Weight:    52.8 kg (116 lb 4.8 oz)  Height:        Intake/Output Summary (Last 24 hours) at 08/13/2017 0805 Last data filed at 08/13/2017 0600 Gross per 24 hour  Intake 1516 ml  Output 2075 ml  Net -559 ml   Filed Weights   08/11/17 0434 08/12/17 0411 08/13/17 0426  Weight: 56.8 kg (125 lb 3.2 oz) 55.6 kg (122 lb 9.2  oz) 52.8 kg (116 lb 4.8 oz)    Exam:   General:  83 yo AAM thin built on [place]. Alert and oriented x 3  Eyes: pupils are round and reactive to light   ENT: Mucous membrane are moist no erythema or exudates  Neck: No JVD or thyromegaly   Cardiovascular: RRR no rubs or gallops  Respiratory: Diffused  but improved rhonchi bilaterally. No wheezes noted.  Abdomen: soft NT ND NBS x4  Skin: No noted open lesions  Musculoskeletal: Non focal. No LE edema  Psychiatric: Mood is appropriate for condition and setting  Neurologic: Alert and oriented x 3. No acute focal neurological deficits   Data Reviewed: CBC: Recent Labs  Lab 08/08/17 0911 08/09/17 0457 08/10/17 0444  WBC 5.6 6.3 8.0  NEUTROABS 3.4  --   --   HGB 10.3* 9.8* 10.3*  HCT 29.9* 28.2* 29.8*  MCV 81.9 81.3 81.6  PLT 79* 85* 88*   Basic Metabolic Panel: Recent Labs  Lab 08/09/17 0457 08/10/17 0444 08/11/17 0913 08/12/17 0505 08/13/17 0426  NA 132* 136 139 140 140  K 5.0 3.8 3.8 3.5 3.2*  CL 96* 98* 100* 100* 97*  CO2 23 22 27 28 30   GLUCOSE 249* 174* 143* 133* 139*  BUN >100* 134* 147* 141* 144*  CREATININE 7.11* 6.58* 5.72* 5.83* 5.51*  CALCIUM 8.8* 8.8* 8.7* 8.7* 9.1   GFR: Estimated Creatinine Clearance: 6 mL/min (A) (by C-G formula based on SCr of 5.51 mg/dL (H)). Liver Function Tests: Recent Labs  Lab 08/08/17 0911 08/09/17 0457  AST 61* 37  ALT 42 38  ALKPHOS 146* 131*  BILITOT 0.9 0.9  PROT 6.5 6.6  ALBUMIN 3.2* 3.2*   No results for input(s): LIPASE, AMYLASE in the last 168 hours. No results for input(s): AMMONIA in the last 168 hours. Coagulation Profile: No results for input(s): INR, PROTIME in the last 168 hours. Cardiac Enzymes: No results for input(s): CKTOTAL, CKMB, CKMBINDEX, TROPONINI in the last 168 hours. BNP (last 3 results) Recent Labs    08/20/16 1221 12/22/16 1453  PROBNP 373.0* 999.0*   HbA1C: No results for input(s): HGBA1C in the last 72 hours. CBG: No results for input(s): GLUCAP in the last 168 hours. Lipid Profile: No results for input(s): CHOL, HDL, LDLCALC, TRIG, CHOLHDL, LDLDIRECT in the last 72 hours. Thyroid Function Tests: No results for input(s): TSH, T4TOTAL, FREET4, T3FREE, THYROIDAB in the last 72 hours. Anemia Panel: No results for input(s):  VITAMINB12, FOLATE, FERRITIN, TIBC, IRON, RETICCTPCT in the last 72 hours. Urine analysis:    Component Value Date/Time   COLORURINE YELLOW 06/07/2017 [place] 06/07/2017 1313   LABSPEC 1.013 06/07/2017 1313   PHURINE 5.0 06/07/2017 1313   GLUCOSEU NEGATIVE 06/07/2017 1313   HGBUR NEGATIVE 06/07/2017 1313   BILIRUBINUR NEGATIVE 06/07/2017 1313   KETONESUR NEGATIVE 06/07/2017 1313   PROTEINUR 100 (A) 06/07/2017 1313   NITRITE NEGATIVE 06/07/2017 1313   LEUKOCYTESUR NEGATIVE 06/07/2017 1313   Sepsis Labs: @LABRCNTIP (procalcitonin:4,lacticidven:4)  ) Recent Results (from the past 240 hour(s))  Culture, blood (Routine X 2) w Reflex to ID Panel     Status: None (Preliminary result)   Collection Time: 08/08/17  9:11 AM  Result Value Ref Range Status   Specimen Description BLOOD BLOOD LEFT HAND  Final   Special Requests   Final    BOTTLES DRAWN AEROBIC AND ANAEROBIC Blood Culture adequate volume   Culture   Final    NO GROWTH 4 DAYS Performed  at [facility], [place] [address] [place], [place] [address]    Report Status PENDING  Incomplete  Culture, blood (Routine X 2) w Reflex to ID Panel     Status: None (Preliminary result)   Collection Time: 08/08/17  9:16 AM  Result Value Ref Range Status   Specimen Description BLOOD BLOOD LEFT ARM  Final   Special Requests   Final    BOTTLES DRAWN AEROBIC AND ANAEROBIC Blood Culture adequate volume   Culture   Final    NO GROWTH 4 DAYS Performed at [facility], [place] [address], [place], [place] [address]    Report Status PENDING  Incomplete      Studies: Dg Chest Port 1 View  Result Date: 08/12/2017 CLINICAL DATA:  Weakness EXAM: PORTABLE CHEST 1 VIEW COMPARISON:  08/08/2017 FINDINGS: Cardiomegaly with vascular congestion. Bilateral airspace opacities are noted, most confluent in the lung bases. Small right pleural effusion. No acute bony abnormality. IMPRESSION: Cardiomegaly with vascular congestion. Stable  bibasilar opacities and small right effusion. Opacities could represent edema or infection. Electronically Signed   By: Rolm Baptise M.D.   On: 08/12/2017 09:43    Scheduled Meds: . ceFEPime (MAXIPIME) IV  500 mg Intravenous Q24H  . diltiazem  360 mg Oral Daily  . ferrous sulfate  325 mg Oral Q breakfast  . ipratropium-albuterol  3 mL Nebulization TID  . isosorbide mononitrate  30 mg Oral Daily  . mouth rinse  15 mL Mouth Rinse BID  . metoprolol tartrate  12.5 mg Oral BID  . potassium chloride  40 mEq Oral BID    Continuous Infusions: . furosemide 160 mg (08/13/17 2956)  . magnesium sulfate 1 - 4 g bolus IVPB    . metronidazole 500 mg (08/13/17 2130)     LOS: 5 days     Kayleen Memos, MD [facility] Pager [phone]  If 7PM-7AM, please contact night-coverage [email or web address] Password [email or web address] 08/13/2017, 8:05 AM

## 2017-08-13 NOTE — Progress Notes (Signed)
I agree with the assessment from the previous RN.

## 2017-08-13 NOTE — Progress Notes (Addendum)
                                                                                                                                                                                                          Daily Progress Note   Patient Name: Johnny Morrow       Date: 08/13/2017 DOB: 07/10/1920  Age: 83 y.o. MRN#: [id number] Attending Physician: Hall, Carole N, DO Primary Care Physician: Husain, Karrar, MD Admit Date: 08/08/2017  Reason for Consultation/Follow-up: Establishing goals of care  Subjective: Mr. Reid "I'm good." He emphatically expresses he is "fine."   Length of Stay: 5  Current Medications: Scheduled Meds:  . ceFEPime (MAXIPIME) IV  500 mg Intravenous Q24H  . diltiazem  360 mg Oral Daily  . ferrous sulfate  325 mg Oral Q breakfast  . ipratropium-albuterol  3 mL Nebulization TID  . isosorbide mononitrate  30 mg Oral Daily  . mouth rinse  15 mL Mouth Rinse BID  . metoprolol tartrate  12.5 mg Oral BID  . potassium chloride  40 mEq Oral BID    Continuous Infusions: . furosemide 160 mg (08/13/17 0638)  . magnesium sulfate 1 - 4 g bolus IVPB    . metronidazole 500 mg (08/13/17 0637)    PRN Meds:   Physical Exam         Constitutional: He is oriented to person, place, and time. He appears well-developed. He has a sickly appearance.  Thin, frail, elderly  HENT:  Head: Normocephalic and atraumatic.  Cardiovascular: An irregularly irregular rhythm present. Tachycardia present.  Pulmonary/Chest: No accessory muscle usage. No tachypnea. No respiratory distress. He has rhonchi. He has rales.  Abdominal: Soft. Normal appearance.  Neurological: He is alert and oriented to person, place, and time.  Difficult to understand at times  Nursing note and vitals reviewed.   Vital Signs: BP (!) 126/91 (BP Location: Right Arm)   Pulse 65   Temp 97.8 F (36.6 C) (Oral)   Resp 18   Ht 5' 5" (1.651 m)   Wt 52.8 kg (116 lb 4.8 oz)   SpO2 93%   BMI 19.35 kg/m  SpO2: SpO2: 93  % O2 Device: O2 Device: Not Delivered O2 Flow Rate: O2 Flow Rate (L/min): 2 L/min  Intake/output summary:   Intake/Output Summary (Last 24 hours) at 08/13/2017 0901 Last data filed at 08/13/2017 0600 Gross per 24 hour  Intake 1276 ml  Output 1625 ml  Net -349 ml   LBM: Last BM Date: 08/12/17 Baseline Weight: Weight: 58.9 kg (129 lb 12.8 oz)   Most recent weight: Weight: 52.8 kg (116 lb 4.8 oz)       Palliative Assessment/Data: 30%      Patient Active Problem List   Diagnosis Date Noted  . Acute renal failure superimposed on stage 4 chronic kidney disease ([place])   . Goals of care, counseling/discussion   . Palliative care encounter   . Atrial flutter ([place]) 07/31/2017  . HCAP (healthcare-associated pneumonia) 07/19/2017  . Pneumonia 07/19/2017  . Tachycardia 06/15/2017  . Retroperitoneal bleed 5/18 06/15/2017  . Chronic diastolic CHF (congestive heart failure) ([place]) 06/15/2017  . Elevated troponin   . Closed intertrochanteric fracture of hip, right, initial encounter ([place]) 10 /18 06/07/2017  . AF (paroxysmal atrial fibrillation) ([place]) 12/22/2016  . Pulmonary nodule less than 6 cm determined by computed tomography of lung 11/27/2016  . Normocytic anemia 11/27/2016  . Chronic kidney disease (CKD), stage IV (severe) ([place]) 08/20/2016  . Hyperlipidemia 08/20/2016  . Moderate tricuspid regurgitation 01/11/2016  . Mild aortic stenosis 01/11/2016  . Mild aortic regurgitation 01/11/2016  . Osteoporosis 01/11/2016  . Prediabetes 12/19/2015  . Essential hypertension, benign 09/25/2015  . History of prostate cancer 09/25/2015  . Idiopathic scoliosis, thoracic 09/25/2015  . History of DVT of lower extremity, left 09/25/2015    Palliative Care Assessment & Plan   HPI: 83 y.o. male  with past medical history of diastolic heart failure with EF 65-70% (2017), CKD stage IV, chronic normocytic anemia, chronic a-fib not anticoagulated due to prior retroperitoneal hemorrhage, recent  HCAP completed antibiotics 07/28/17 admitted on 08/08/2017 with worsening SOB. Being treated this admission for HCAP, acute on chronic worsening renal failure, acute on chronic heart failure. Prognosis noted to be very poor mostly due to declining renal function. Palliative care requested to assist with GOC.   Assessment: I met today at Mr. Fontanella's bedside along with his wife. Mr. Streater participates somewhat but falls asleep throughout conversation at times. His wife seems to have insight into poor prognosis and desire for more comfort focused care but very hopeful for her husband to live longer. Mr. Migliaccio makes contradicting comments at times and tells me "it [fluid] won't come back" as I was discussing the concern with progressing renal disease but agreeing to comfort focused care with further decline at home. I impressed that we are utilizing very potent diuretics to try and get his fluid status improved and I fear (if he is able to get home) that this will become problematic sooner than later. I explained that for this reason we are recommending consideration of hospice given their goal NOT to return to the hospital so that they have assistance to make sure he is comfortable and does not suffer. They are open to hospice care BUT have been getting 4 hours/day from [name] which is covered by insurance and want to utilize this resource as long as they are able too (since they do not pay for this and because hospice cannot offer 4 hours/day care). I will discuss with [facility]MR[facility] and will meet with family again tomorrow ~9am.   We also discussed and completed MOST form: DNR, comfort measures (NO return to the hospital), determine benefit/limitation of antibiotic when infection occurs, no IVF, no feeding tube. They are very clear they want his health optimized prior to discharge (although expectations may not be realistic at this time). Mr. Mckeithan was asleep during parts of conversation but agreed with his  wife on MOST decisions and plan. Will continue to discuss.   Recommendations/Plan:  MOST complete  They desire continued hospitalization with hopes of better managing fluid status and optimizing renal function - renal following and discussing as well  Goals of Care and Additional Recommendations:  Limitations on Scope of Treatment: No Artificial Feeding, No Hemodialysis and No Tracheostomy  Code Status:  DNR  Prognosis:   Likely weeks or even less depending on response to current aggressive diuresis  Discharge Planning:  To Be Determined. Recommending home with hospice.    Thank you for allowing the Palliative Medicine Team to assist in the care of this patient.   Time In: 0915 Time Out: 1045 Total Time 90min Prolonged Time Billed  yes       Greater than 50%  of this time was spent counseling and coordinating care related to the above assessment and plan.  Alicia Parker, NP Palliative Medicine Team Pager # [phone] (M-F 8a-5p) Team Phone # [phone] (Nights/Weekends)      

## 2017-08-14 DIAGNOSIS — J479 Bronchiectasis, uncomplicated: Secondary | ICD-10-CM

## 2017-08-14 LAB — BASIC METABOLIC PANEL
ANION GAP: 17 — AB (ref 5–15)
BUN: 141 mg/dL — ABNORMAL HIGH (ref 6–20)
CO2: 28 mmol/L (ref 22–32)
Calcium: 9.3 mg/dL (ref 8.9–10.3)
Chloride: 95 mmol/L — ABNORMAL LOW (ref 101–111)
Creatinine, Ser: 5.45 mg/dL — ABNORMAL HIGH (ref 0.61–1.24)
GFR calc Af Amer: 9 mL/min — ABNORMAL LOW (ref >60)
GFR, EST NON AFRICAN AMERICAN: 8 mL/min — AB (ref >60)
GLUCOSE: 240 mg/dL — AB (ref 65–99)
POTASSIUM: 4.1 mmol/L (ref 3.5–5.1)
Sodium: 140 mmol/L (ref 135–145)

## 2017-08-14 LAB — PH, BODY FLUID: pH, Body Fluid: 7.7

## 2017-08-14 LAB — PATHOLOGIST SMEAR REVIEW

## 2017-08-14 MED ORDER — ENSURE ENLIVE PO LIQD
237.0000 mL | Freq: Two times a day (BID) | ORAL | Status: DC
  Administered 2017-08-14 – 2017-08-16 (×4): 237 mL via ORAL

## 2017-08-14 MED ORDER — FUROSEMIDE 40 MG PO TABS
160.0000 mg | ORAL_TABLET | Freq: Two times a day (BID) | ORAL | Status: DC
  Administered 2017-08-14 – 2017-08-16 (×4): 160 mg via ORAL
  Filled 2017-08-14 (×4): qty 4

## 2017-08-14 NOTE — Progress Notes (Signed)
[facility]  PROGRESS NOTE  Johnny Morrow FBP:[id number] DOB: 1919/09/12 DOA: 08/08/2017 PCP: Wenda Low, MD   Brief HPI:   83 y.o.malewith medical history significant forchronic heart failure with preserved EF 65-70% (2017), CKD stage IV, chronic normocytic anemia, chronic a-fib not anticoagulated due to prior retroperitoneal hemorrhage, recent HCAP completed antibiotics 07/28/17 presents with complaints of progressively worsening dyspnea. He is alert and oriented x 3. Reports not on O2 supplement in the SNF. Admits to productive cough and dyspnea on exertion. Denies chest pain, chills, or fever.  The writer informed the patient that will call his daughter patient states "no, not now." Daughter has requested to be called for update. Patient has poor overall insight of his condition. Will consult psychiatry to evaluate capacity. The patient has no capacity per psychiatry Dr. Mariea Clonts.      Subjective   Patient is breathing better.   Assessment/Plan:     1. Acute hypoxic respiratory failure- multifactorial from acute on chronic CHF, pulmonary hypertension, chronic lung disease including bronchiectasis/interstitial lung disease. Patient has diuresed well with IV Lasix. Currently not requiring oxygen. Still has some rhonchi bilaterally. We'll consult pulmonary for further recommendations. 2. Pleural effusion- patient underwent thoracentesis on 08/13/2017, with 0.3 L serosanguineous fluid. Obtained. Final labs are pending. Appears transudate at this time. 3. Healthcare associated pneumonia- blood cultures 2 are negative to date. Continue IV Flagyl and IV cefepime. Vancomycin stopped on 08/09/2017. 4. Acute on chronic CHF- preserved EF50-55%. Diuresed with IV Lasix. Much improvement. He is not requiring oxygen this morning. 5. Atrial flutter- continue by mouth diltiazem and Lopressor. 6. Acute kidney injury on CK D stage V- baseline creatinine 4.5. Nephrology was consulted and  patient is currently on IV Lasix. Today creatinine is 5.45. Patient is not a candidate for dialysis. Nephrology has signed off. 7. Chronic thrombocytopenia- last CBC on 10/10/2016 showed platelets of 88,000.   Patient has been accepted for home hospice. Likely discharge home in a.m. Discussed with patient's daughter on phone.    DVT prophylaxis: SCD  Code Status: DO NOT RESUSCITATE  Family Communication: Discussed with patient's wife at bedside and daughter on phone  Disposition Plan: Likely home hospice in a.m.   Consultants:  Nephrology  Procedures:  None  Continuous infusions . metronidazole Stopped (08/14/17 1612)      Antibiotics:   Anti-infectives (From admission, onward)   Start     Dose/Rate Route Frequency Ordered Stop   08/09/17 1400  metroNIDAZOLE (FLAGYL) IVPB 500 mg     500 mg 100 mL/hr over 60 Minutes Intravenous Every 8 hours 08/09/17 1134     08/09/17 1200  ceFEPIme (MAXIPIME) 500 mg in dextrose 5 % 50 mL IVPB     500 mg 100 mL/hr over 30 Minutes Intravenous Every 24 hours 08/09/17 1134     08/09/17 1000  ceFEPIme (MAXIPIME) 2 g in dextrose 5 % 50 mL IVPB  Status:  Discontinued     2 g 100 mL/hr over 30 Minutes Intravenous Every 24 hours 08/09/17 0312 08/09/17 1123   08/08/17 1045  ceFEPIme (MAXIPIME) 2 g in dextrose 5 % 50 mL IVPB     2 g 100 mL/hr over 30 Minutes Intravenous  Once 08/08/17 1032 08/08/17 1134   08/08/17 1045  vancomycin (VANCOCIN) IVPB 1000 mg/200 mL premix     1,000 mg 200 mL/hr over 60 Minutes Intravenous  Once 08/08/17 1032 08/08/17 1234       Objective   Vitals:   08/13/17 2108 08/14/17 0529 08/14/17  1610 08/14/17 1416  BP: 129/84 (!) 131/99 130/79   Pulse: 71 60 72   Resp: 20 18    Temp: 98.4 F (36.9 C) 97.6 F (36.4 C) 98.2 F (36.8 C)   TempSrc: Oral Oral Oral   SpO2: 94% 92% 94% 95%  Weight:  52.7 kg (116 lb 3.2 oz)    Height:        Intake/Output Summary (Last 24 hours) at 08/14/2017 1821 Last data  filed at 08/14/2017 1300 Gross per 24 hour  Intake 332 ml  Output 1125 ml  Net -793 ml   Filed Weights   08/12/17 0411 08/13/17 0426 08/14/17 0529  Weight: 55.6 kg (122 lb 9.2 oz) 52.8 kg (116 lb 4.8 oz) 52.7 kg (116 lb 3.2 oz)     Physical Examination:  Physical Exam: Eyes: No icterus, extraocular muscles intact  Mouth: Oral mucosa is moist, no lesions on palate,  Neck: Supple, no deformities, masses, or tenderness Lungs: Normal respiratory effort, bilateral clear to auscultation, no crackles or wheezes.  Heart: Regular rate and rhythm, S1 and S2 normal, no murmurs, rubs auscultated Abdomen: BS normoactive,soft,nondistended,non-tender to palpation,no organomegaly Extremities: No pretibial edema, no erythema, no cyanosis, no clubbing Neuro : Alert and oriented to time, place and person, No focal deficits  Skin: No rashes seen on exam     Data Reviewed: I have personally reviewed following labs and imaging studies  CBG: No results for input(s): GLUCAP in the last 168 hours.  CBC: Recent Labs  Lab 08/08/17 0911 08/09/17 0457 08/10/17 0444  WBC 5.6 6.3 8.0  NEUTROABS 3.4  --   --   HGB 10.3* 9.8* 10.3*  HCT 29.9* 28.2* 29.8*  MCV 81.9 81.3 81.6  PLT 79* 85* 88*    Basic Metabolic Panel: Recent Labs  Lab 08/10/17 0444 08/11/17 0913 08/12/17 0505 08/13/17 0426 08/14/17 1003  NA 136 139 140 140 140  K 3.8 3.8 3.5 3.2* 4.1  CL 98* 100* 100* 97* 95*  CO2 22 27 28 30 28   GLUCOSE 174* 143* 133* 139* 240*  BUN 134* 147* 141* 144* 141*  CREATININE 6.58* 5.72* 5.83* 5.51* 5.45*  CALCIUM 8.8* 8.7* 8.7* 9.1 9.3    Recent Results (from the past 240 hour(s))  Culture, blood (Routine X 2) w Reflex to ID Panel     Status: None   Collection Time: 08/08/17  9:11 AM  Result Value Ref Range Status   Specimen Description BLOOD BLOOD LEFT HAND  Final   Special Requests   Final    BOTTLES DRAWN AEROBIC AND ANAEROBIC Blood Culture adequate volume   Culture   Final    NO  GROWTH 5 DAYS Performed at [facility], [address] [place], [place] [address]    Report Status 08/13/2017 FINAL  Final  Culture, blood (Routine X 2) w Reflex to ID Panel     Status: None   Collection Time: 08/08/17  9:16 AM  Result Value Ref Range Status   Specimen Description BLOOD BLOOD LEFT ARM  Final   Special Requests   Final    BOTTLES DRAWN AEROBIC AND ANAEROBIC Blood Culture adequate volume   Culture   Final    NO GROWTH 5 DAYS Performed at [facility], [place] [address] [place], [place] [address]    Report Status 08/13/2017 FINAL  Final  Culture, body fluid-bottle     Status: None (Preliminary result)   Collection Time: 08/13/17  3:11 PM  Result Value  Ref Range Status   Specimen Description FLUID PLEURAL RIGHT  Final   Special Requests BOTTLES DRAWN AEROBIC AND ANAEROBIC  Final   Culture   Final    NO GROWTH < 24 HOURS Performed at [facility], [place] [address], [place], [place] [address]    Report Status PENDING  Incomplete  Gram stain     Status: None   Collection Time: 08/13/17  3:11 PM  Result Value Ref Range Status   Specimen Description FLUID PLEURAL RIGHT  Final   Special Requests NONE  Final   Gram Stain   Final    FEW WBC PRESENT, PREDOMINANTLY MONONUCLEAR NO ORGANISMS SEEN Performed at [facility], [place] [address] [place], [place] [address]    Report Status 08/13/2017 FINAL  Final     Liver Function Tests: Recent Labs  Lab 08/08/17 0911 08/09/17 0457  AST 61* 37  ALT 42 38  ALKPHOS 146* 131*  BILITOT 0.9 0.9  PROT 6.5 6.6  ALBUMIN 3.2* 3.2*   No results for input(s): LIPASE, AMYLASE in the last 168 hours. No results for input(s): AMMONIA in the last 168 hours.  Cardiac Enzymes: No results for input(s): CKTOTAL, CKMB, CKMBINDEX, TROPONINI in the last 168 hours. BNP (last 3 results) Recent Labs    06/07/17 1022 07/19/17 1828 08/08/17 0911  BNP 409.3* 2,324.6* 749.9*    ProBNP (last 3 results) Recent Labs     08/20/16 1221 12/22/16 1453  PROBNP 373.0* 999.0*      Studies: Dg Chest 1 View  Result Date: 08/13/2017 CLINICAL DATA:  83 year old post right-sided thoracentesis. Subsequent encounter. EXAM: CHEST 1 VIEW COMPARISON:  08/13/2017 chest CT.  08/12/2017 chest x-ray. FINDINGS: Decrease in size of right-sided pleural effusion post thoracentesis. No pneumothorax. Remainder of pulmonary findings without change. Cardiomegaly. Calcified tortuous aorta. Scoliosis. Left shoulder joint degenerative changes. IMPRESSION: Decrease in size of right-sided pleural effusion post thoracentesis. No pneumothorax. Electronically Signed   By: Genia Del M.D.   On: 08/13/2017 15:27   Ct Chest Wo Contrast  Result Date: 08/13/2017 CLINICAL DATA:  83 year old male with history of chronic heart failure with preserved ejection fraction and stage 4 chronic kidney disease presenting with progressively worsening dyspnea. EXAM: CT CHEST WITHOUT CONTRAST TECHNIQUE: Multidetector CT imaging of the chest was performed following the standard protocol without IV contrast. COMPARISON:  No priors. FINDINGS: Cardiovascular: Heart size is mildly enlarged with biatrial dilatation. There is no significant pericardial fluid, thickening or pericardial calcification. There is aortic atherosclerosis, as well as atherosclerosis of the great vessels of the mediastinum and the coronary arteries, including calcified atherosclerotic plaque in the left main, left anterior descending and left circumflex coronary arteries. Calcifications of the aortic valve and mitral annulus. Mediastinum/Nodes: No pathologically enlarged mediastinal or hilar lymph nodes. Please note that accurate exclusion of hilar adenopathy is limited on noncontrast CT scans. Esophagus is unremarkable in appearance. No axillary lymphadenopathy. Lungs/Pleura: Small right pleural effusion. Extensive atelectasis in the right lower lobe which is very poorly aerated; some associated  airspace consolidation is also suspected in the right lower lobe. Patchy areas with thickening of the peribronchovascular interstitium are noted throughout the lungs bilaterally, most evident in the left lower lobe with there is also some regional architectural distortion and patchy areas of ground-glass attenuation. In the upper lobes of the lungs bilaterally there are some scattered areas of mild cylindrical bronchiectasis with some septal thickening, peripheral bronchiolectasis and what appears to be some areas of early honeycombing. 4  mm subpleural nodule along the left major fissure in the anterior aspect of the superior segment of the left lower lobe (axial image 57 of series 7), most likely a subpleural lymph node. No other larger more suspicious appearing pulmonary nodules or masses are noted. Upper Abdomen: 2 mm nonobstructive calculus in the upper pole collecting system of the right kidney. Left kidney is incompletely imaged, but there appears to be at least mild if not moderate hydronephrosis. Low-attenuation lesions in the liver, incompletely characterized but measuring up to 1.7 cm in segment 7, favored to represent cysts. Aortic atherosclerosis. Musculoskeletal: There are no aggressive appearing lytic or blastic lesions noted in the visualized portions of the skeleton. IMPRESSION: 1. There appears to be a combination of atelectasis and airspace consolidation in the right lower lobe concerning for possible pneumonia. This is associated with a small parapneumonic pleural effusion. Additional patchy foci of peribronchovascular airspace disease are noted elsewhere in the lungs, most evident in the left lower lobe. 2. There is a spectrum of findings in the lungs concerning for interstitial lung disease. At this time, findings are most concerning for potential chronic hypersensitivity pneumonitis. Repeat high-resolution chest CT is suggested in 12 months to assess for temporal changes in the appearance of  the lung parenchyma. 3. Cardiomegaly with biatrial dilatation. 4. Aortic atherosclerosis, in addition to left main and 2 vessel coronary artery disease. 5. There are calcifications of the aortic valve and mitral annulus. Echocardiographic correlation for evaluation of potential valvular dysfunction may be warranted if clinically indicated. 6. 2 mm nonobstructive calculus in the upper pole collecting system of the right kidney. Left kidney is incompletely visualized, but there appears to be at least mild if not moderate left-sided hydronephrosis. If there is any clinical concern for obstructive uropathy, further evaluation with noncontrast CT the abdomen and pelvis is suggested to evaluate for potential ureteral obstruction. Aortic Atherosclerosis (ICD10-I70.0). Electronically Signed   By: Vinnie Langton M.D.   On: 08/13/2017 11:03   US Thoracentesis Asp Pleural Space W/img Guide  Result Date: 08/13/2017 INDICATION: History of healthcare acquired pneumonia with a small right-sided pleural effusion noted on recent imaging. Request is made for diagnostic and therapeutic thoracentesis. EXAM: ULTRASOUND GUIDED DIAGNOSTIC AND THERAPEUTIC THORACENTESIS MEDICATIONS: 1% lidocaine COMPLICATIONS: None immediate. PROCEDURE: An ultrasound guided thoracentesis was thoroughly discussed with the patient and questions answered. The benefits, risks, alternatives and complications were also discussed. The patient understands and wishes to proceed with the procedure. Written consent was obtained. Ultrasound was performed to localize and mark an adequate pocket of fluid in the right chest. The area was then prepped and draped in the normal sterile fashion. 1% Lidocaine was used for local anesthesia. Under ultrasound guidance a Safe-T-Centesis catheter was introduced. Thoracentesis was performed. The catheter was removed and a dressing applied. FINDINGS: A total of approximately 0.35 L of serosanguineous fluid was removed. Samples  were sent to the laboratory as requested by the clinical team. IMPRESSION: Successful ultrasound guided right thoracentesis yielding 0.35 L of pleural fluid. Read by: Saverio Danker, PA-C Electronically Signed   By: Sandi Mariscal M.D.   On: 08/13/2017 16:46    Scheduled Meds: . ceFEPime (MAXIPIME) IV  500 mg Intravenous Q24H  . diltiazem  360 mg Oral Daily  . feeding supplement (ENSURE ENLIVE)  237 mL Oral BID BM  . ferrous sulfate  325 mg Oral Q breakfast  . furosemide  160 mg Oral BID  . ipratropium-albuterol  3 mL Nebulization TID  . isosorbide mononitrate  30 mg Oral Daily  . mouth rinse  15 mL Mouth Rinse BID  . metoprolol tartrate  12.5 mg Oral BID      Time spent: 25 min  [facility] Pager [phone]. If 7PM-7AM, please contact night-coverage at [email or web address], Office  [phone]  password [email or web address]  08/14/2017, 6:21 PM  LOS: 6 days

## 2017-08-14 NOTE — Plan of Care (Signed)
Eating better today and taking ensure (strawberry). Continue.

## 2017-08-14 NOTE — Progress Notes (Signed)
[facility] Liaison: RN visit  Notified by Vinie Sill NP with PMT  of patient/family request for [facility] services at home after discharge. Chart and patient information under review by [facility] physician. Hospice eligibility pending at this time.  Writer spoke with patient and wife briefly at bedside to initiate education related to hospice philosophy, services and team approach to care.  The wife did not have time to talk today and requested a visit 12/29 at [facility].   Per discussion, plan is for discharge to home in next few days.   Please send signed and completed DNR form home with patient/family. Patient will need prescriptions for discharge comfort medications.  DME needs have been discussed, patient currently has the following equipment in the home: walker and cane. Patient/family requests the following DME for delivery to the home: W/C . [facility] equipment manager has been notified and will contact [place] to arrange delivery to the home. Home address has been verified and is correct in the chart. [place] Vandergriff is the family member to contact to arrange time of delivery.  [facility] aware of the above. Please notify [facility] when patient is ready to leave the unit at discharge. (Call [phone] or [phone] after 5pm.) [facility] information and contact numbers given to at time of visit. Above information shared with CMRN.  Please call with any hospice related questions.  Thank you for this referral.  Farrel Gordon, RN, [facility] Liaison [phone]   All hospital liaisons are on [place]

## 2017-08-14 NOTE — Progress Notes (Signed)
Daily Progress Note   Patient Name: Johnny Morrow       Date: 08/14/2017 DOB: November 22, 1919  Age: 83 y.o. MRN#: [id number] Attending Physician: Oswald Hillock, MD Primary Care Physician: Wenda Low, MD Admit Date: 08/08/2017  Reason for Consultation/Follow-up: Establishing goals of care  Subjective: Mr. Johnny Morrow feels better today. Voice is a little clearer and he says he slept very well last night. Denies any trouble or discomfort with breathing.   Length of Stay: 6  Current Medications: Scheduled Meds:  . ceFEPime (MAXIPIME) IV  500 mg Intravenous Q24H  . diltiazem  360 mg Oral Daily  . ferrous sulfate  325 mg Oral Q breakfast  . ipratropium-albuterol  3 mL Nebulization TID  . isosorbide mononitrate  30 mg Oral Daily  . mouth rinse  15 mL Mouth Rinse BID  . metolazone  2.5 mg Oral BID  . metoprolol tartrate  12.5 mg Oral BID    Continuous Infusions: . furosemide Stopped (08/14/17 0624)  . metronidazole 500 mg (08/14/17 0532)    PRN Meds:   Physical Exam         Constitutional: He is oriented to person, place, and time. He appears well-developed. He has a sickly appearance.  Thin, frail, elderly  HENT:  Head: Normocephalic and atraumatic.  Cardiovascular: An irregularly irregular rhythm present. Tachycardia present.  Pulmonary/Chest: No accessory muscle usage. No tachypnea. No respiratory distress. He has rhonchi. He has rales.  Abdominal: Soft. Normal appearance.  Neurological: He is alert and oriented to person, place, and time.  Difficult to understand at times  Nursing note and vitals reviewed.   Vital Signs: BP (!) 131/99 (BP Location: Left Arm)   Pulse 60   Temp 97.6 F (36.4 C) (Oral)   Resp 18   Ht _0  (1.651 m)   Wt 52.7 kg (116 lb 3.2 oz)   SpO2 94%    BMI 19.34 kg/m  SpO2: SpO2: 94 % O2 Device: O2 Device: Not Delivered O2 Flow Rate: O2 Flow Rate (L/min): 2 L/min  Intake/output summary:   Intake/Output Summary (Last 24 hours) at 08/14/2017 1000 Last data filed at 08/14/2017 0827 Gross per 24 hour  Intake 714 ml  Output 1800 ml  Net -1086 ml   LBM: Last BM Date: 08/12/17 Baseline Weight: Weight: 58.9 kg (129 lb  12.8 oz) Most recent weight: Weight: 52.7 kg (116 lb 3.2 oz)       Palliative Assessment/Data: 30%    Flowsheet Rows     Most Recent Value  Intake Tab  Referral Department  Hospitalist  Unit at Time of Referral  Med/Surg Unit  Palliative Care Primary Diagnosis  -- [healthcare-associated pneumonia]  Date Notified  08/09/17  Palliative Care Type  New Palliative care  Reason for referral  Clarify Goals of Care, Counsel Regarding Hospice  Date of Admission  08/08/17  Date first seen by Palliative Care  08/12/17  # of days Palliative referral response time  3 Day(s)  # of days IP prior to Palliative referral  1  Clinical Assessment  Psychosocial & Spiritual Assessment  Palliative Care Outcomes      Patient Active Problem List   Diagnosis Date Noted  . Evaluation by psychiatric service required 08/13/2017  . Acute renal failure superimposed on stage 4 chronic kidney disease ([place])   . Goals of care, counseling/discussion   . Palliative care encounter   . Atrial flutter ([place]) 07/31/2017  . HCAP (healthcare-associated pneumonia) 07/19/2017  . Pneumonia 07/19/2017  . Tachycardia 06/15/2017  . Retroperitoneal bleed 5/18 06/15/2017  . Chronic diastolic CHF (congestive heart failure) ([place]) 06/15/2017  . Elevated troponin   . Closed intertrochanteric fracture of hip, right, initial encounter ([place]) 10 /18 06/07/2017  . AF (paroxysmal atrial fibrillation) ([place]) 12/22/2016  . Pulmonary nodule less than 6 cm determined by computed tomography of lung 11/27/2016  . Normocytic anemia 11/27/2016  . Chronic kidney  disease (CKD), stage IV (severe) ([place]) 08/20/2016  . Hyperlipidemia 08/20/2016  . Moderate tricuspid regurgitation 01/11/2016  . Mild aortic stenosis 01/11/2016  . Mild aortic regurgitation 01/11/2016  . Osteoporosis 01/11/2016  . Prediabetes 12/19/2015  . Essential hypertension, benign 09/25/2015  . History of prostate cancer 09/25/2015  . Idiopathic scoliosis, thoracic 09/25/2015  . History of DVT of lower extremity, left 09/25/2015    Palliative Care Assessment & Plan   HPI: 83 y.o. male  with past medical history of diastolic heart failure with EF 65-70% (2017), CKD stage IV, chronic normocytic anemia, chronic a-fib not anticoagulated due to prior retroperitoneal hemorrhage, recent HCAP completed antibiotics 07/28/17 admitted on 08/08/2017 with worsening SOB. Being treated this admission for HCAP, acute on chronic worsening renal failure, acute on chronic heart failure. Prognosis noted to be very poor mostly due to declining renal function. Palliative care requested to assist with GOC.   Assessment: I met today at Mr. Johnny Morrow's bedside along with his wife. Mr. Johnny Morrow participates somewhat but falls asleep throughout conversation at times. Mrs. Johnny Morrow is aware that [facility] is no longer an option for them so we further discuss hospice and the support they can provide at home. Mrs. Johnny Morrow continues to confirm the desire to optimize his health (she now agrees that he is close to being optimized and planning for potential transition home with hospice this weekend). MOST has been completed to reflect comfort focused care and no rehospitalization. Discussed hospice role in assisting with comfort and minimize suffering when symptoms occur such as SOB and to call hospice instead of EMS - she was happy to have this option.   They await more information from pulmonary to discuss CT chest results - they wish for pulmonologist to discuss with their daughter Dr. Cammy Brochure (number on board in room). No  further questions or concerns today. They are pleased with his status today.   Recommendations/Plan:  MOST complete  They desire continued hospitalization with hopes of better managing fluid status and optimizing renal function and respiratory status  Home with hospice to follow  Goals of Care and Additional Recommendations:  Limitations on Scope of Treatment: No Artificial Feeding, No Hemodialysis and No Tracheostomy  Code Status:  DNR  Prognosis:   Likely weeks or even less depending on response to current aggressive diuresis  Discharge Planning:  To Be Determined. Recommending home with hospice.    Thank you for allowing the Palliative Medicine Team to assist in the care of this patient.   Total Time 59mn Prolonged Time Billed  no      Greater than 50%  of this time was spent counseling and coordinating care related to the above assessment and plan.  AVinie Sill NP Palliative Medicine Team Pager # [phone](M-F 8a-5p) Team Phone # [phone](Nights/Weekends)

## 2017-08-14 NOTE — Evaluation (Signed)
Physical Therapy Evaluation Patient Details Name: Johnny Morrow MRN: [id number] DOB: [birth year] Today's Date: 08/14/2017   History of Present Illness  83 yo male admitted with dyspnea, acute resp failure, Pna, acute on chronic CKD. Hx of Afib, pulm edema, FTT, CKD, anemia, IM nail R LE 05/2017.      Clinical Impression  On eval, pt required Mod assist to stand and Min assist for ambulation. Pt walked ~10 feet with a RW. He is unsteady and at risk for falls. Pt presents with general weakness, decreased activity tolerance, and impaired gait and balance. Noted dyspnea with activity as well. No family present during session. Per chart, plan is for home with hospice. Will follow and progress activity as pt tolerates.     Follow Up Recommendations Supervision/Assistance - 24 hour (per chart, plan is for home with hospice)    Equipment Recommendations  Wheelchair     Recommendations for Other Services       Precautions / Restrictions Precautions Precautions: Fall Restrictions Weight Bearing Restrictions: No      Mobility  Bed Mobility               General bed mobility comments: oob in recliner  Transfers Overall transfer level: Needs assistance Equipment used: Rolling walker (2 wheeled) Transfers: Sit to/from Stand Sit to Stand: Mod assist         General transfer comment: Assist to rise, stabilize, control descent. Posterior bias. VCS safety, technique, hand/feet placement. Pre-gait marching prior to ambulation.   Ambulation/Gait Ambulation/Gait assistance: Min assist Ambulation Distance (Feet): 10 Feet Assistive device: Rolling walker (2 wheeled) Gait Pattern/deviations: Step-to pattern;Step-through pattern;Decreased stride length;Decreased step length - right;Decreased step length - left;Narrow base of support;Trunk flexed     General Gait Details: Assist to stabilize and maneuver safely with RW. Slow gait speed. Unsteady and at risk for falls. Pt fatigues easily.  Dyspnea 2/4.   Stairs            Wheelchair Mobility    Modified Rankin (Stroke Patients Only)       Balance Overall balance assessment: Needs assistance   Sitting balance-Leahy Scale: Fair   Postural control: Posterior lean   Standing balance-Leahy Scale: Poor                               Pertinent Vitals/Pain Pain Assessment: No/denies pain    Home Living Family/patient expects to be discharged to:: Private residence Living Arrangements: Spouse/significant other Available Help at Discharge: Family Type of Home: Independent living facility([name]) Home Access: Level entry     Home Layout: One level Home Equipment: Cane - single point;Grab bars - tub/shower;Walker - 2 wheels;Adaptive equipment      Prior Function Level of Independence: Needs assistance   Gait / Transfers Assistance Needed: RW and assistanace for ambulation most recently           Hand Dominance        Extremity/Trunk Assessment   Upper Extremity Assessment Upper Extremity Assessment: Generalized weakness    Lower Extremity Assessment Lower Extremity Assessment: Generalized weakness    Cervical / Trunk Assessment Cervical / Trunk Assessment: Kyphotic  Communication   Communication: Expressive difficulties  Cognition Arousal/Alertness: Awake/alert Behavior During Therapy: WFL for tasks assessed/performed Overall Cognitive Status: Difficult to assess  General Comments: appears to be [facility]      General Comments      Exercises General Exercises - Lower Extremity Long Arc Quad: AROM;10 reps;Seated Hip Flexion/Marching: AROM;Both;10 reps;Standing Heel Raises: AROM;Both;10 reps;Standing   Assessment/Plan    PT Assessment Patient needs continued PT services  PT Problem List Decreased strength;Decreased mobility;Decreased activity tolerance;Decreased balance;Decreased knowledge of use of DME;Decreased  coordination       PT Treatment Interventions DME instruction;Therapeutic activities;Therapeutic exercise;Gait training;Functional mobility training;Balance training;Patient/family education    PT Goals (Current goals can be found in the Care Plan section)  Acute Rehab PT Goals Patient Stated Goal: to go home  PT Goal Formulation: With patient Time For Goal Achievement: 08/28/17 Potential to Achieve Goals: Poor    Frequency Min 2X/week   Barriers to discharge        Co-evaluation               AM-PAC PT "6 Clicks" Daily Activity  Outcome Measure Difficulty turning over in bed (including adjusting bedclothes, sheets and blankets)?: Unable Difficulty moving from lying on back to sitting on the side of the bed? : Unable Difficulty sitting down on and standing up from a chair with arms (e.g., wheelchair, bedside commode, etc,.)?: Unable Help needed moving to and from a bed to chair (including a wheelchair)?: A Lot Help needed walking in hospital room?: A Lot Help needed climbing 3-5 steps with a railing? : Total 6 Click Score: 8    End of Session Equipment Utilized During Treatment: Gait belt Activity Tolerance: Patient limited by fatigue Patient left: in chair;with call bell/phone within reach;with chair alarm set   PT Visit Diagnosis: Difficulty in walking, not elsewhere classified (R26.2);Muscle weakness (generalized) (M62.81);History of falling (Z91.81)    Time: 9373-4287 PT Time Calculation (min) (ACUTE ONLY): 24 min   Charges:   PT Evaluation $PT Eval Moderate Complexity: 1 Mod PT Treatments $Gait Training: 8-22 mins   PT G Codes:         Weston Anna, MPT Pager: [phone]

## 2017-08-14 NOTE — Care Management Important Message (Signed)
Important Message  Patient Details  Name: Enoch Moffa MRN: [id number] Date of Birth: [birth year]   Medicare Important Message Given:  Yes    Kerin Salen 08/14/2017, 11:25 AMImportant Message  Patient Details  Name: Mayank Teuscher MRN: [id number] Date of Birth: Dec 15, 1919   Medicare Important Message Given:  Yes    Kerin Salen 08/14/2017, 11:25 AM

## 2017-08-14 NOTE — Progress Notes (Signed)
[facility] Progress Note  Subjective: 1.5 L out yest, up in chair.     Vitals:   08/13/17 1925 08/13/17 2108 08/14/17 0529 08/14/17 0923  BP:  129/84 (!) 131/99   Pulse:  71 60   Resp:  20 18   Temp:  98.4 F (36.9 C) 97.6 F (36.4 C)   TempSrc:  Oral Oral   SpO2: 91% 94% 92% 94%  Weight:   52.7 kg (116 lb 3.2 oz)   Height:        Inpatient medications: . ceFEPime (MAXIPIME) IV  500 mg Intravenous Q24H  . diltiazem  360 mg Oral Daily  . feeding supplement (ENSURE ENLIVE)  237 mL Oral BID BM  . ferrous sulfate  325 mg Oral Q breakfast  . ipratropium-albuterol  3 mL Nebulization TID  . isosorbide mononitrate  30 mg Oral Daily  . mouth rinse  15 mL Mouth Rinse BID  . metolazone  2.5 mg Oral BID  . metoprolol tartrate  12.5 mg Oral BID   . furosemide Stopped (08/14/17 6301)  . metronidazole 500 mg (08/14/17 0532)     Exam: Elderly AAM, less coughing, still gurgling respirations noted No jvd Chest no change bibasilar rales, no wheezing RRR  Abd soft ntnd Ext 1- 2+ pitting depend hip edema bilat, a little better Condom cath NF, ox 3  Last UA 06/07/17 - negative Renal US 12/23 - 8.5 cm kidneys, no hydro, chronic medical renal disease CXR 12/26 - improving bilat edema, still R effusion     Impression: 1. Acute on CKD 5 - baseline creat 3- 4 from 2 mos ago. Plan is conservative care, no dialysis.  Pulm edema resolved on CXR yesterday. Still has some peripheral edema,  down 6kg w/ diuresis.  Will change to po lasix 160 bid.   2. Afib, permanent 3. HTN - bp's stable 4. DNR - pall care meeting w/ family and pt.  Possible dc to hospice soon. I d/w w/ daughter and wife on the phone today and answered questions about what to expect with the dying process from uremia.  No further suggestions, will sign off, have d/w primary MD.   Plan - as above   Kelly Splinter MD [facility] pager [phone]   08/14/2017, 12:12 PM   Recent Labs  Lab  08/12/17 0505 08/13/17 0426 08/14/17 1003  NA 140 140 140  K 3.5 3.2* 4.1  CL 100* 97* 95*  CO2 28 30 28   GLUCOSE 133* 139* 240*  BUN 141* 144* 141*  CREATININE 5.83* 5.51* 5.45*  CALCIUM 8.7* 9.1 9.3   Recent Labs  Lab 08/08/17 0911 08/09/17 0457  AST 61* 37  ALT 42 38  ALKPHOS 146* 131*  BILITOT 0.9 0.9  PROT 6.5 6.6  ALBUMIN 3.2* 3.2*   Recent Labs  Lab 08/08/17 0911 08/09/17 0457 08/10/17 0444  WBC 5.6 6.3 8.0  NEUTROABS 3.4  --   --   HGB 10.3* 9.8* 10.3*  HCT 29.9* 28.2* 29.8*  MCV 81.9 81.3 81.6  PLT 79* 85* 88*   Iron/TIBC/Ferritin/ %Sat    Component Value Date/Time   IRON 30 (L) 07/21/2017 0512   TIBC 253 07/21/2017 0512   FERRITIN 241 07/21/2017 0512   IRONPCTSAT 12 (L) 07/21/2017 7322

## 2017-08-14 NOTE — Progress Notes (Signed)
Initial Nutrition Assessment  DOCUMENTATION CODES:   (Will assess for malnutrition at follow-up, if appropriate at that time)  INTERVENTION:  - Will order Ensure Enlive po BID, each supplement provides 350 kcal and 20 grams of protein - Continue to encourage PO intakes of meals and supplements.  - RD will continue to monitor POC/GOC and associated nutrition-related needs. - Will attempt to complete NFPE at follow-up.   NUTRITION DIAGNOSIS:   Inadequate oral intake related to acute illness as evidenced by other (comment)(PO intakes do not meet estimated kcal need).  GOAL:   Patient will meet greater than or equal to 90% of their needs  MONITOR:   PO intake, Supplement acceptance, Weight trends, Labs  REASON FOR ASSESSMENT:   Consult Assessment of nutrition requirement/status  ASSESSMENT:   83 y.o. male with medical history significant for chronic heart failure with preserved EF 65-70% (2017), CKD stage IV, chronic normocytic anemia, chronic a-fib not anticoagulated due to prior retroperitoneal hemorrhage, recent HCAP completed antibiotics 07/28/17 presents with complaints of progressively worsening dyspnea. Reports not on O2 supplement in the SNF. Admits to productive cough and dyspnea on exertion.  BMI indicates normal weight. Unable to perform NFPE at this time. Per chart review, pt consumed 80% of breakfast dinner and 90% of lunch on 12/26. Per review of [facility], intakes of breakfast and dinner on 12/26 provided 650 kcal, 32 grams of protein and it does not show lunch being ordered (brought in from home?). Unable to obtain information at this time from pt/family so all information obtained from the chart. Per review, pt has lost 21 lbs (15% body weight) in the past 1 month which is significant for time frame; pt is noted to be on aggressive diuretics at this time.   Palliative Care is following and saw pt yesterday as well as this AM. Notes reviewed. Per note this AM, pt and  wife are open to home hospice at time of d/c and it is possible that pt will d/c over the weekend, "MOST has been completed to reflect comfort focused care and no rehospitalization." Palliative notes outline a prognosis of weeks or less.   Medications reviewed; 325 mg ferrous sulfate/day, 160 mg IV Lasix TID, 2 g IV Mg sulfate x1 run yesterday, 40 mEq oral KCl x2 doses yesterday.  Labs reviewed from 12/27 AM; K: 3.2 mmol/L, Cl: 97 mmol/L, BUN: 144 mg/dL, creatinine: 5.51 mg/dL, GFR: 9 mL/min.     NUTRITION - FOCUSED PHYSICAL EXAM:  Unable to complete/assess at this time but will attempt at follow-up  Diet Order:  Diet Heart Room service appropriate? Yes; Fluid consistency: Thin  EDUCATION NEEDS:   No education needs have been identified at this time  Skin:  Skin Assessment: Reviewed RN Assessment  Last BM:  12/26  Height:   Ht Readings from Last 1 Encounters:  08/08/17 5\' 5"  (1.651 m)    Weight:   Wt Readings from Last 1 Encounters:  08/14/17 116 lb 3.2 oz (52.7 kg)    Ideal Body Weight:  61.82 kg  BMI:  Body mass index is 19.34 kg/m.  Estimated Nutritional Needs:   Kcal:  1320-1580 (25-30 kcal/kg)  Protein:  52-63 grams (1-1.2 grams/kg)  Fluid:  >/= 1.6 L/day     Jarome Matin, MS, RD, LDN, [facility] Inpatient Clinical Dietitian Pager # [phone] After hours/weekend pager # [phone]

## 2017-08-14 NOTE — Progress Notes (Signed)
Family has agreed to home hospice.  D/w Dr. Darrick Meigs.  PCCM will sign off.  Chesley Mires, MD [facility] Pulmonary/Critical Care 08/14/2017, 4:12 PM Pager:  [phone] After 3pm call: [phone]

## 2017-08-14 NOTE — Consult Note (Signed)
Name: Oakley Kossman MRN: [id number] DOB: 28-Dec-1919    ADMISSION DATE:  08/08/2017 CONSULTATION DATE:  08/14/2017  REFERRING MD :  Dr. Nevada Crane  CHIEF COMPLAINT: Effusion and Respiratory Distress   BRIEF PATIENT DESCRIPTION: Frail elderly male OOB in chair, on RA and pleasantly confused. States he cannot tell if he feels better after thoracentesis 12/27.  SIGNIFICANT EVENTS  Admission 12/22 12/27>> Thoracentesis for .35 L of serosanguineous colored fluid 12/26>> Palliative care consult  ABX:  Flagyl 12/23>> Cefepime 12/23 >> Vancomycin 12/22>>12/23  Cultures: Blood 12/22>> No growth Pleural Fluid 12/27>> pending  STUDIES:  12/27>> There appears to be a combination of atelectasis and airspace consolidation in the right lower lobe concerning for possible pneumonia. This is associated with a small parapneumonic pleural effusion. Additional patchy foci of peribronchovascular airspace disease are noted elsewhere in the lungs, most evident in the left lower lobe. 2. There is a spectrum of findings in the lungs concerning for interstitial lung disease. At this time, findings are most concerning for potential chronic hypersensitivity pneumonitis. Repeat high-resolution chest CT is suggested in 12 months to assess for temporal changes in the appearance of the lung parenchyma.   HISTORY OF PRESENT ILLNESS:   Hendrixx Severin a 83 y.o.malewith medical history significant forchronic heart failure with preserved EF 65-70% (2017), CKD stage IV, chronic normocytic anemia, chronic a-fib not anticoagulated due to prior retroperitoneal hemorrhage, recent HCAP completed antibiotics 07/28/17 presents with complaints of progressively worsening dyspnea. He is alert and oriented x 2-3. Reports not on O2 supplement in the SNF. Admits to productive cough and dyspnea on exertion. Denies chest pain, chills, or fever.   PAST MEDICAL HISTORY :   has a past medical history of Anemia, Arthritis, Cataract,  Chronic diastolic (congestive) heart failure ([place]), CKD (chronic kidney disease), stage III ([place]), DVT (deep venous thrombosis) ([place]), History of blood transfusion (~ 01/2017; 05/2017), Hypertension, Permanent atrial fibrillation ([place]), Pneumonia (12/2016), Prostate cancer ([place]), Radiation cystitis, Retinal hemorrhage of both eyes, and Retroperitoneal bleed (?12/2016).  has a past surgical history that includes Tonsillectomy; Prostatectomy; Intramedullary (im) nail intertrochanteric (Right, 06/07/2017); Fracture surgery; Inguinal hernia repair; and Tibia fracture surgery (Left, 06/2012). Prior to Admission medications   Medication Sig Start Date End Date Taking? Authorizing Provider  albuterol (PROVENTIL HFA;VENTOLIN HFA) 108 (90 Base) MCG/ACT inhaler Inhale 2 puffs into the lungs every 6 (six) hours as needed for wheezing or shortness of breath. 08/20/16  Yes [place], Betty G, MD  aspirin EC 81 MG tablet Take 1 tablet (81 mg total) by mouth 2 (two) times daily. 06/08/17  Yes Corky Sing, PA-C  B Complex Vitamins (VITAMIN-B COMPLEX PO) Take 1 tablet by mouth daily.    Yes [provider]  bisacodyl (DULCOLAX) 5 MG EC tablet Take 1 tablet (5 mg total) by mouth daily as needed for moderate constipation. 06/12/17  Yes Nita Sells, MD  Cholecalciferol (VITAMIN D3) 5000 units TABS Take 5,000 Units by mouth daily.    Yes [provider]  Coenzyme Q10 (CO Q 10) 100 MG CAPS Take 100 mg by mouth daily.   Yes [provider]  diltiazem (CARDIZEM CD) 360 MG 24 hr capsule Take 1 capsule (360 mg total) by mouth daily. 07/26/17 08/25/17 Yes Dessa Phi, DO  doxazosin (CARDURA) 1 MG tablet Take 1 mg by mouth every evening.   Yes [provider]  ferrous sulfate 325 (65 FE) MG tablet Take 1 tablet (325 mg total) by mouth daily with breakfast. 12/22/16  Yes [place],  Malka So, MD  furosemide (LASIX) 80 MG tablet Take 80 mg by mouth 2 (two) times daily.   Yes [provider]  isosorbide mononitrate (IMDUR) 60 MG 24 hr tablet Take 0.5 tablets (30 mg total) daily by mouth. 06/22/17  Yes Caren Griffins, MD  Melatonin 3 MG TABS Take 10 mg by mouth at bedtime.    Yes [provider]  metoprolol tartrate (LOPRESSOR) 25 MG tablet Take 0.5 tablets (12.5 mg total) by mouth 2 (two) times daily. 07/26/17 08/25/17 Yes Dessa Phi, DO  Multiple Vitamin (MULTI VITAMIN PO) Take 1 tablet by mouth daily.    Yes [provider]  Omega 3 1000 MG CAPS Take 2,000 mg by mouth daily.   Yes [provider]  polyethylene glycol (MIRALAX / GLYCOLAX) packet Take 17 g by mouth daily. Patient taking differently: Take 34 g by mouth daily.  06/12/17  Yes Nita Sells, MD  potassium chloride 20 MEQ TBCR Take 20 mEq by mouth daily. 07/25/17  Yes Dessa Phi, DO  Probiotic Product (PROBIOTIC PO) Chew 1 tablet (250 million cell) by mouth once a day   Yes [provider]  senna (SENOKOT) 8.6 MG TABS tablet Take 1 tablet by mouth every morning.   Yes [provider]  traMADol (ULTRAM) 50 MG tablet Take 1 tablet (50 mg total) every 6 (six) hours as needed by mouth for moderate pain. 06/22/17  Yes Caren Griffins, MD   Allergies  Allergen Reactions  . Iodinated Diagnostic Agents Rash  . Ace Inhibitors Other (See Comments)    Noted (on MAR) to be "allergic," but no reaction was cited/noted  . Shellfish-Derived Products Other (See Comments)    Noted (on MAR) to be "allergic," but no reaction was cited/noted  . Penicillins Rash    Has patient had a PCN reaction causing immediate rash, facial/tongue/throat swelling, SOB or lightheadedness with hypotension: Yes Has patient had a PCN reaction causing severe rash involving mucus membranes or skin necrosis: Unknown Has patient had a PCN reaction that required hospitalization: Unknown Has patient had a PCN reaction occurring within the last 10 years: Unknown If all of the above answers  are "NO", then may proceed with Cephalosporin use.     FAMILY HISTORY:  family history includes COPD in his brother; Cancer in his father; Diabetes in his mother; Heart attack in his brother and father. SOCIAL HISTORY:  reports that he quit smoking about 72 years ago. His smoking use included cigarettes. He quit after 10.00 years of use. he has never used smokeless tobacco. He reports that he drinks about 0.6 oz of alcohol per week. He reports that he does not use drugs.  REVIEW OF SYSTEMS:   Constitutional: Negative for fever, chills, weight loss, malaise/fatigue and diaphoresis.  HENT: Negative for hearing loss, ear pain, nosebleeds, congestion, sore throat, neck pain, tinnitus and ear discharge.   Eyes: Negative for blurred vision, double vision, photophobia, pain, discharge and redness.  Respiratory: +  for cough, no hemoptysis, + sputum production, + shortness of breath with exertion, less at rest, ,no  wheezing and stridor. Cardiovascular: Negative for chest pain, palpitations, orthopnea, claudication, leg swelling and PND.  Gastrointestinal: Negative for heartburn, nausea, vomiting, abdominal pain, diarrhea, constipation, blood in stool and melena.  Genitourinary: Negative for dysuria, urgency, frequency, hematuria and flank pain.  Musculoskeletal: Negative for myalgias, back pain, joint pain and falls.  Skin: Negative for itching and rash.  Neurological: Negative for dizziness, tingling, tremors, sensory change,  speech change, focal weakness, seizures, loss of consciousness, weakness and headaches.  Endo/Heme/Allergies: Negative for environmental allergies and polydipsia. Does not bruise/bleed easily.  SUBJECTIVE:  Awake and alert frail male sitting in a chair, knows he is in the hospital, but has periods of confusion.  VITAL SIGNS: Temp:  [97.6 F (36.4 C)-98.4 F (36.9 C)] 97.6 F (36.4 C) (12/28 0529) Pulse Rate:  [60-74] 60 (12/28 0529) Resp:  [18-20] 18 (12/28 0529) BP:  (114-131)/(83-99) 131/99 (12/28 0529) SpO2:  [91 %-94 %] 94 % (12/28 0923) Weight:  [116 lb 3.2 oz (52.7 kg)] 116 lb 3.2 oz (52.7 kg) (12/28 0529)  PHYSICAL EXAMINATION: General:  Frail elderly male , sitting  in chair on room air, gets SOB while speaking Neuro:  Awake, MAE x 4, following commands, Pleasantly confused HEENT:  NCAT, trace JVD Cardiovascular:  S1, S2, Irreg, No RMG, fib per telemetry Lungs: Coarse throughout with crackles per bases Abdomen: Non-distended, non-tender  Musculoskeletal:No obvious abnormalities, frail Skin:  Thin and frail, intact  Recent Labs  Lab 08/12/17 0505 08/13/17 0426 08/14/17 1003  NA 140 140 140  K 3.5 3.2* 4.1  CL 100* 97* 95*  CO2 28 30 28   BUN 141* 144* 141*  CREATININE 5.83* 5.51* 5.45*  GLUCOSE 133* 139* 240*   Recent Labs  Lab 08/08/17 0911 08/09/17 0457 08/10/17 0444  HGB 10.3* 9.8* 10.3*  HCT 29.9* 28.2* 29.8*  WBC 5.6 6.3 8.0  PLT 79* 85* 88*   Dg Chest 1 View  Result Date: 08/13/2017 CLINICAL DATA:  83 year old post right-sided thoracentesis. Subsequent encounter. EXAM: CHEST 1 VIEW COMPARISON:  08/13/2017 chest CT.  08/12/2017 chest x-ray. FINDINGS: Decrease in size of right-sided pleural effusion post thoracentesis. No pneumothorax. Remainder of pulmonary findings without change. Cardiomegaly. Calcified tortuous aorta. Scoliosis. Left shoulder joint degenerative changes. IMPRESSION: Decrease in size of right-sided pleural effusion post thoracentesis. No pneumothorax. Electronically Signed   By: Genia Del M.D.   On: 08/13/2017 15:27   Ct Chest Wo Contrast  Result Date: 08/13/2017 CLINICAL DATA:  83 year old male with history of chronic heart failure with preserved ejection fraction and stage 4 chronic kidney disease presenting with progressively worsening dyspnea. EXAM: CT CHEST WITHOUT CONTRAST TECHNIQUE: Multidetector CT imaging of the chest was performed following the standard protocol without IV contrast.  COMPARISON:  No priors. FINDINGS: Cardiovascular: Heart size is mildly enlarged with biatrial dilatation. There is no significant pericardial fluid, thickening or pericardial calcification. There is aortic atherosclerosis, as well as atherosclerosis of the great vessels of the mediastinum and the coronary arteries, including calcified atherosclerotic plaque in the left main, left anterior descending and left circumflex coronary arteries. Calcifications of the aortic valve and mitral annulus. Mediastinum/Nodes: No pathologically enlarged mediastinal or hilar lymph nodes. Please note that accurate exclusion of hilar adenopathy is limited on noncontrast CT scans. Esophagus is unremarkable in appearance. No axillary lymphadenopathy. Lungs/Pleura: Small right pleural effusion. Extensive atelectasis in the right lower lobe which is very poorly aerated; some associated airspace consolidation is also suspected in the right lower lobe. Patchy areas with thickening of the peribronchovascular interstitium are noted throughout the lungs bilaterally, most evident in the left lower lobe with there is also some regional architectural distortion and patchy areas of ground-glass attenuation. In the upper lobes of the lungs bilaterally there are some scattered areas of mild cylindrical bronchiectasis with some septal thickening, peripheral bronchiolectasis and what appears to be some areas of early honeycombing. 4 mm subpleural nodule along the left major fissure  in the anterior aspect of the superior segment of the left lower lobe (axial image 57 of series 7), most likely a subpleural lymph node. No other larger more suspicious appearing pulmonary nodules or masses are noted. Upper Abdomen: 2 mm nonobstructive calculus in the upper pole collecting system of the right kidney. Left kidney is incompletely imaged, but there appears to be at least mild if not moderate hydronephrosis. Low-attenuation lesions in the liver, incompletely  characterized but measuring up to 1.7 cm in segment 7, favored to represent cysts. Aortic atherosclerosis. Musculoskeletal: There are no aggressive appearing lytic or blastic lesions noted in the visualized portions of the skeleton. IMPRESSION: 1. There appears to be a combination of atelectasis and airspace consolidation in the right lower lobe concerning for possible pneumonia. This is associated with a small parapneumonic pleural effusion. Additional patchy foci of peribronchovascular airspace disease are noted elsewhere in the lungs, most evident in the left lower lobe. 2. There is a spectrum of findings in the lungs concerning for interstitial lung disease. At this time, findings are most concerning for potential chronic hypersensitivity pneumonitis. Repeat high-resolution chest CT is suggested in 12 months to assess for temporal changes in the appearance of the lung parenchyma. 3. Cardiomegaly with biatrial dilatation. 4. Aortic atherosclerosis, in addition to left main and 2 vessel coronary artery disease. 5. There are calcifications of the aortic valve and mitral annulus. Echocardiographic correlation for evaluation of potential valvular dysfunction may be warranted if clinically indicated. 6. 2 mm nonobstructive calculus in the upper pole collecting system of the right kidney. Left kidney is incompletely visualized, but there appears to be at least mild if not moderate left-sided hydronephrosis. If there is any clinical concern for obstructive uropathy, further evaluation with noncontrast CT the abdomen and pelvis is suggested to evaluate for potential ureteral obstruction. Aortic Atherosclerosis (ICD10-I70.0). Electronically Signed   By: Vinnie Langton M.D.   On: 08/13/2017 11:03   US Thoracentesis Asp Pleural Space W/img Guide  Result Date: 08/13/2017 INDICATION: History of healthcare acquired pneumonia with a small right-sided pleural effusion noted on recent imaging. Request is made for  diagnostic and therapeutic thoracentesis. EXAM: ULTRASOUND GUIDED DIAGNOSTIC AND THERAPEUTIC THORACENTESIS MEDICATIONS: 1% lidocaine COMPLICATIONS: None immediate. PROCEDURE: An ultrasound guided thoracentesis was thoroughly discussed with the patient and questions answered. The benefits, risks, alternatives and complications were also discussed. The patient understands and wishes to proceed with the procedure. Written consent was obtained. Ultrasound was performed to localize and mark an adequate pocket of fluid in the right chest. The area was then prepped and draped in the normal sterile fashion. 1% Lidocaine was used for local anesthesia. Under ultrasound guidance a Safe-T-Centesis catheter was introduced. Thoracentesis was performed. The catheter was removed and a dressing applied. FINDINGS: A total of approximately 0.35 L of serosanguineous fluid was removed. Samples were sent to the laboratory as requested by the clinical team. IMPRESSION: Successful ultrasound guided right thoracentesis yielding 0.35 L of pleural fluid. Read by: Saverio Danker, PA-C Electronically Signed   By: Sandi Mariscal M.D.   On: 08/13/2017 16:46    ASSESSMENT / PLAN: -Acute Hypoxic Respiratory Failure 2/2 HCAP vs acute on chronic HF -Small parapneumonic pleural effusion -Right Thoracentesis 0.3 L 12/27>> serosanguinous fluid  CT Chest - spectrum of findings in the lungs concerning for interstitial lung disease. Findings are most concerning for potential chronic hypersensitivity pneumonitis. - last echo in 2017 LV EF 65-70% -repeated echo 08/09/17 revealed LVEF 50-55%  Plan: Oxygen supplementation to maintain sats  92% or greater Continuous pulse oximetry Continue Duonebs TID Continue diuresis per renal/ primary Daily weights BNP prn Maintain net negative balance Continue IV flagyl and IV cefepime per primary Follow micro and adjust antibiotics per sensitivities Pt. Too frail for Chest PT   Per patient RN and Per  Palliation note, patient may go home with hospice this weekend and in hospital care has been optimized. There have been discussions with family as patient has ESRD  (conservative management No HD) Chronic  CHF, chronic a fib not anticoagulated 2/2 previous retroperitoneal bleed, recent HCAP with antibiotic  treatment completed 07/28/2017 and readmission . Prognosis is poor due to rapidly declining renal function. Palliation is involved to align plan of care with goals of care. PCCM will follow up Monday if patient remains in hospital.     Magdalen Spatz, AGACNP-BC Pulmonary and [place] Pager: [phone]  08/14/2017, 11:37 AM

## 2017-08-15 LAB — BASIC METABOLIC PANEL WITH GFR
Anion gap: 15 (ref 5–15)
BUN: 147 mg/dL — ABNORMAL HIGH (ref 6–20)
CO2: 32 mmol/L (ref 22–32)
Calcium: 9.2 mg/dL (ref 8.9–10.3)
Chloride: 93 mmol/L — ABNORMAL LOW (ref 101–111)
Creatinine, Ser: 5.24 mg/dL — ABNORMAL HIGH (ref 0.61–1.24)
GFR calc Af Amer: 10 mL/min — ABNORMAL LOW
GFR calc non Af Amer: 8 mL/min — ABNORMAL LOW
Glucose, Bld: 150 mg/dL — ABNORMAL HIGH (ref 65–99)
Potassium: 3.7 mmol/L (ref 3.5–5.1)
Sodium: 140 mmol/L (ref 135–145)

## 2017-08-15 MED ORDER — IPRATROPIUM-ALBUTEROL 0.5-2.5 (3) MG/3ML IN SOLN
3.0000 mL | Freq: Two times a day (BID) | RESPIRATORY_TRACT | Status: DC
  Filled 2017-08-15 (×2): qty 3

## 2017-08-15 NOTE — Progress Notes (Signed)
[facility]  PROGRESS NOTE  Johnny Morrow IFO:[id number] DOB: 10/14/1919 DOA: 08/08/2017 PCP: Wenda Low, MD   Brief HPI:    83 y.o.malewith medical history significant forchronic heart failure with preserved EF 65-70% (2017), CKD stage IV, chronic normocytic anemia, chronic a-fib not anticoagulated due to prior retroperitoneal hemorrhage, recent HCAP completed antibiotics 07/28/17 presents with complaints of progressively worsening dyspnea. He is alert and oriented x 3. Reports not on O2 supplement in the SNF. Admits to productive cough and dyspnea on exertion. Denies chest pain, chills, or fever.  The writer informed the patient that will call his daughter patient states "no, not now." Daughter has requested to be called for update. Patient has poor overall insight of his condition. Will consult psychiatry to evaluate capacity. The patient has no capacity per psychiatry Dr. Mariea Clonts.      Subjective   Patient denies shortness of breath.   Assessment/Plan:     1. Acute hypoxic respiratory failure- improved, multifactorial from acute on chronic CHF, pulmonary hypertension, chronic lung disease including bronchiectasis/interstitial lung disease. Patient has diuresed well with IV Lasix. Currently not requiring oxygen. Was seen by pulmonary service yesterday.  2. Pleural effusion- patient underwent thoracentesis on 08/13/2017, with 0.3 L serosanguineous fluid. Obtained. Final labs are pending. Appears transudate at this time. 3. Healthcare associated pneumonia- blood cultures 2 are negative to date. Continue IV Flagyl and IV cefepime. Vancomycin stopped on 08/09/2017. 4. Acute on chronic CHF- preserved EF50-55%. Diuresed with IV Lasix. Much improvement. He is not requiring oxygen  5. Atrial flutter- continue by mouth diltiazem and Lopressor. 6. Acute kidney injury on CK D stage V- baseline creatinine 4.5. Nephrology was consulted and patient is currently on IV Lasix. Today  creatinine is 5.24 Patient is not a candidate for dialysis. Nephrology has signed off. 7. Chronic thrombocytopenia- last CBC on 10/10/2016 showed platelets of 88,000.   Patient has been accepted for home hospice. Likely discharge home in a.m. Discussed with patient's daughter on phone.    DVT prophylaxis: SCD  Code Status: DO NOT RESUSCITATE  Family Communication: Discussed with patient's wife at bedside and daughter on phone  Disposition Plan: Likely home hospice in a.m.   Consultants:  Nephrology  Procedures:  None  Continuous infusions . metronidazole 500 mg (08/15/17 1300)      Antibiotics:   Anti-infectives (From admission, onward)   Start     Dose/Rate Route Frequency Ordered Stop   08/09/17 1400  metroNIDAZOLE (FLAGYL) IVPB 500 mg     500 mg 100 mL/hr over 60 Minutes Intravenous Every 8 hours 08/09/17 1134     08/09/17 1200  ceFEPIme (MAXIPIME) 500 mg in dextrose 5 % 50 mL IVPB     500 mg 100 mL/hr over 30 Minutes Intravenous Every 24 hours 08/09/17 1134     08/09/17 1000  ceFEPIme (MAXIPIME) 2 g in dextrose 5 % 50 mL IVPB  Status:  Discontinued     2 g 100 mL/hr over 30 Minutes Intravenous Every 24 hours 08/09/17 0312 08/09/17 1123   08/08/17 1045  ceFEPIme (MAXIPIME) 2 g in dextrose 5 % 50 mL IVPB     2 g 100 mL/hr over 30 Minutes Intravenous  Once 08/08/17 1032 08/08/17 1134   08/08/17 1045  vancomycin (VANCOCIN) IVPB 1000 mg/200 mL premix     1,000 mg 200 mL/hr over 60 Minutes Intravenous  Once 08/08/17 1032 08/08/17 1234       Objective   Vitals:   08/14/17 1958 08/14/17 2031 08/15/17 6767  08/15/17 0735  BP:  122/76 130/76   Pulse:  (!) 110 61   Resp:  18 19   Temp:  98 F (36.7 C) 97.9 F (36.6 C)   TempSrc:  Oral Oral   SpO2: 92% 96% 95% 95%  Weight:   49 kg (108 lb)   Height:        Intake/Output Summary (Last 24 hours) at 08/15/2017 1437 Last data filed at 08/15/2017 1223 Gross per 24 hour  Intake 720 ml  Output 975 ml  Net  -255 ml   Filed Weights   08/13/17 0426 08/14/17 0529 08/15/17 0437  Weight: 52.8 kg (116 lb 4.8 oz) 52.7 kg (116 lb 3.2 oz) 49 kg (108 lb)     Physical Examination:  Physical Exam: Eyes: No icterus, extraocular muscles intact  Mouth: Oral mucosa is moist, no lesions on palate,  Neck: Supple, no deformities, masses, or tenderness Lungs: Normal respiratory effort, bilateral clear to auscultation, no crackles or wheezes.  Heart: Regular rate and rhythm, S1 and S2 normal, no murmurs, rubs auscultated Abdomen: BS normoactive,soft,nondistended,non-tender to palpation,no organomegaly Extremities: No pretibial edema, no erythema, no cyanosis, no clubbing Neuro : Alert and oriented to time, place and person, No focal deficits Skin: No rashes seen on exam    Data Reviewed: I have personally reviewed following labs and imaging studies  CBG: No results for input(s): GLUCAP in the last 168 hours.  CBC: Recent Labs  Lab 08/09/17 0457 08/10/17 0444  WBC 6.3 8.0  HGB 9.8* 10.3*  HCT 28.2* 29.8*  MCV 81.3 81.6  PLT 85* 88*    Basic Metabolic Panel: Recent Labs  Lab 08/11/17 0913 08/12/17 0505 08/13/17 0426 08/14/17 1003 08/15/17 0511  NA 139 140 140 140 140  K 3.8 3.5 3.2* 4.1 3.7  CL 100* 100* 97* 95* 93*  CO2 27 28 30 28  32  GLUCOSE 143* 133* 139* 240* 150*  BUN 147* 141* 144* 141* 147*  CREATININE 5.72* 5.83* 5.51* 5.45* 5.24*  CALCIUM 8.7* 8.7* 9.1 9.3 9.2    Recent Results (from the past 240 hour(s))  Culture, blood (Routine X 2) w Reflex to ID Panel     Status: None   Collection Time: 08/08/17  9:11 AM  Result Value Ref Range Status   Specimen Description BLOOD BLOOD LEFT HAND  Final   Special Requests   Final    BOTTLES DRAWN AEROBIC AND ANAEROBIC Blood Culture adequate volume   Culture   Final    NO GROWTH 5 DAYS Performed at [facility], [address] [place], [place] [address]    Report Status 08/13/2017 FINAL  Final  Culture, blood (Routine X  2) w Reflex to ID Panel     Status: None   Collection Time: 08/08/17  9:16 AM  Result Value Ref Range Status   Specimen Description BLOOD BLOOD LEFT ARM  Final   Special Requests   Final    BOTTLES DRAWN AEROBIC AND ANAEROBIC Blood Culture adequate volume   Culture   Final    NO GROWTH 5 DAYS Performed at [facility], [place] [address] [place], [place] [address]    Report Status 08/13/2017 FINAL  Final  Culture, body fluid-bottle     Status: None (Preliminary result)   Collection Time: 08/13/17  3:11 PM  Result Value Ref Range Status   Specimen Description FLUID PLEURAL RIGHT  Final   Special Requests BOTTLES DRAWN AEROBIC AND ANAEROBIC  Final   Culture  Final    NO GROWTH 2 DAYS Performed at [facility], [place] [address], [place], [place] [address]    Report Status PENDING  Incomplete  Gram stain     Status: None   Collection Time: 08/13/17  3:11 PM  Result Value Ref Range Status   Specimen Description FLUID PLEURAL RIGHT  Final   Special Requests NONE  Final   Gram Stain   Final    FEW WBC PRESENT, PREDOMINANTLY MONONUCLEAR NO ORGANISMS SEEN Performed at [facility], [place] [address] [place], [place] [address]    Report Status 08/13/2017 FINAL  Final     Liver Function Tests: Recent Labs  Lab 08/09/17 0457  AST 37  ALT 38  ALKPHOS 131*  BILITOT 0.9  PROT 6.6  ALBUMIN 3.2*   No results for input(s): LIPASE, AMYLASE in the last 168 hours. No results for input(s): AMMONIA in the last 168 hours.  Cardiac Enzymes: No results for input(s): CKTOTAL, CKMB, CKMBINDEX, TROPONINI in the last 168 hours. BNP (last 3 results) Recent Labs    06/07/17 1022 07/19/17 1828 08/08/17 0911  BNP 409.3* 2,324.6* 749.9*    ProBNP (last 3 results) Recent Labs    08/20/16 1221 12/22/16 1453  PROBNP 373.0* 999.0*      Studies: Dg Chest 1 View  Result Date: 08/13/2017 CLINICAL DATA:  84 year old post right-sided thoracentesis. Subsequent encounter. EXAM:  CHEST 1 VIEW COMPARISON:  08/13/2017 chest CT.  08/12/2017 chest x-ray. FINDINGS: Decrease in size of right-sided pleural effusion post thoracentesis. No pneumothorax. Remainder of pulmonary findings without change. Cardiomegaly. Calcified tortuous aorta. Scoliosis. Left shoulder joint degenerative changes. IMPRESSION: Decrease in size of right-sided pleural effusion post thoracentesis. No pneumothorax. Electronically Signed   By: Genia Del M.D.   On: 08/13/2017 15:27   US Thoracentesis Asp Pleural Space W/img Guide  Result Date: 08/13/2017 INDICATION: History of healthcare acquired pneumonia with a small right-sided pleural effusion noted on recent imaging. Request is made for diagnostic and therapeutic thoracentesis. EXAM: ULTRASOUND GUIDED DIAGNOSTIC AND THERAPEUTIC THORACENTESIS MEDICATIONS: 1% lidocaine COMPLICATIONS: None immediate. PROCEDURE: An ultrasound guided thoracentesis was thoroughly discussed with the patient and questions answered. The benefits, risks, alternatives and complications were also discussed. The patient understands and wishes to proceed with the procedure. Written consent was obtained. Ultrasound was performed to localize and mark an adequate pocket of fluid in the right chest. The area was then prepped and draped in the normal sterile fashion. 1% Lidocaine was used for local anesthesia. Under ultrasound guidance a Safe-T-Centesis catheter was introduced. Thoracentesis was performed. The catheter was removed and a dressing applied. FINDINGS: A total of approximately 0.35 L of serosanguineous fluid was removed. Samples were sent to the laboratory as requested by the clinical team. IMPRESSION: Successful ultrasound guided right thoracentesis yielding 0.35 L of pleural fluid. Read by: Saverio Danker, PA-C Electronically Signed   By: Sandi Mariscal M.D.   On: 08/13/2017 16:46    Scheduled Meds: . ceFEPime (MAXIPIME) IV  500 mg Intravenous Q24H  . diltiazem  360 mg Oral Daily  .  feeding supplement (ENSURE ENLIVE)  237 mL Oral BID BM  . ferrous sulfate  325 mg Oral Q breakfast  . furosemide  160 mg Oral BID  . ipratropium-albuterol  3 mL Nebulization BID  . isosorbide mononitrate  30 mg Oral Daily  . mouth rinse  15 mL Mouth Rinse BID  . metoprolol tartrate  12.5 mg Oral BID      Time  spent: 25 min  [facility] Pager [phone]. If 7PM-7AM, please contact night-coverage at [email or web address], Office  [phone]  password [email or web address]  08/15/2017, 2:37 PM  LOS: 7 days

## 2017-08-15 NOTE — Progress Notes (Addendum)
Notified by Johnny Sill NP with PMT  of patient/family request for [facility] services at home after discharge. Eligibility has been confirmed.   Met wife at bedside to initiate education related to hospice philosophy, services and team approach to care. Confirmed interest and desire for services. Provided spouse with brochure with [facility] contact information during this visit. Confirmed request for W/C to be delivered to home after patient returns home. Spouse prefers to have RN assess home environment before arranging for additional DME. Home address has been verified and is correct in Epic. Johnny Morrow is family member to contact to arrange DME delivery.   Please send signed and completed DNR home with patient/family.  Please send home with patient prescriptions for any medication patient does not already have including comfort medications.  [facility] aware of the above. Please notify [facility] when patient is ready to leave the unit at discharge. (Call [phone] or [phone] after 5pm.)  Please call with any hospice related questions.  Thank you for this referral.  Erling Conte, LCSW [phone]

## 2017-08-16 LAB — BASIC METABOLIC PANEL
ANION GAP: 14 (ref 5–15)
BUN: 162 mg/dL — ABNORMAL HIGH (ref 6–20)
CALCIUM: 9.2 mg/dL (ref 8.9–10.3)
CO2: 35 mmol/L — ABNORMAL HIGH (ref 22–32)
CREATININE: 5.66 mg/dL — AB (ref 0.61–1.24)
Chloride: 92 mmol/L — ABNORMAL LOW (ref 101–111)
GFR, EST AFRICAN AMERICAN: 9 mL/min — AB (ref >60)
GFR, EST NON AFRICAN AMERICAN: 8 mL/min — AB (ref >60)
Glucose, Bld: 151 mg/dL — ABNORMAL HIGH (ref 65–99)
Potassium: 3.8 mmol/L (ref 3.5–5.1)
Sodium: 141 mmol/L (ref 135–145)

## 2017-08-16 MED ORDER — IPRATROPIUM-ALBUTEROL 0.5-2.5 (3) MG/3ML IN SOLN: 3.0000 mL | Freq: Two times a day (BID) | RESPIRATORY_TRACT | 0 refills | Status: AC

## 2017-08-16 MED ORDER — TRAMADOL HCL 50 MG PO TABS: 50.0000 mg | ORAL_TABLET | Freq: Four times a day (QID) | ORAL | 0 refills | Status: AC | PRN

## 2017-08-16 MED ORDER — FUROSEMIDE 80 MG PO TABS: 160.0000 mg | ORAL_TABLET | Freq: Two times a day (BID) | ORAL | 0 refills | Status: AC

## 2017-08-16 NOTE — Progress Notes (Addendum)
Per RN, pt is ready to be D/C today. Contacted [facility] 24 hour patient line at [phone], spoke with Maudie Mercury and made her aware that pt is ready to be D/C today. She will notify RN.

## 2017-08-16 NOTE — Discharge Summary (Signed)
Physician Discharge Summary  Johnny Morrow QMV:[id number] DOB: 06/11/1920 DOA: 08/08/2017  PCP: Wenda Low, MD  Admit date: 08/08/2017 Discharge date: 08/16/2017  Time spent: 35* minutes  Recommendations for Outpatient Follow-up:  1. Follow up PCP in 2 weeks   Discharge Diagnoses:  Principal Problem:   Evaluation by psychiatric service required Active Problems:   HCAP (healthcare-associated pneumonia)   Acute renal failure superimposed on stage 4 chronic kidney disease (HCC)   Goals of care, counseling/discussion   Palliative care encounter   Discharge Condition: Stable  Diet recommendation: regular diet  Filed Weights   08/14/17 0529 08/15/17 0437 08/16/17 0509  Weight: 52.7 kg (116 lb 3.2 oz) 49 kg (108 lb) 51.9 kg (114 lb 6.4 oz)    History of present illness:  83 y.o.malewith medical history significant forchronic heart failure with preserved EF 65-70% (2017), CKD stage IV, chronic normocytic anemia, chronic a-fib not anticoagulated due to prior retroperitoneal hemorrhage, recent HCAP completed antibiotics 07/28/17 presents with complaints of progressively worsening dyspnea. He is alert and oriented x 3. Reports not on O2 supplement in the SNF. Admits to productive cough and dyspnea on exertion. Denies chest pain, chills, or fever.    Hospital Course:   1. Acute hypoxic respiratory failure- improved, multifactorial from acute on chronic CHF, pulmonary hypertension, chronic lung disease including bronchiectasis/interstitial lung disease. Patient has diuresed well with IV Lasix. Currently not requiring oxygen. Was seen by pulmonary service yesterday.  2. Pleural effusion- patient underwent thoracentesis on 08/13/2017, with 0.3 L serosanguineous fluid. Obtained. Final labs are pending. Appears transudate from CHF 3. Healthcare associated pneumonia- blood cultures 2 are negative to date. Patient was started on  IV Flagyl and IV cefepime. Vancomycin stopped on 08/09/2017.  Has completed 8 days of antibiotics in the hospital. Will stop antibiotics, as pneumonia has resolved. 4. Acute on chronic CHF- preserved EF50-55%. Diuresed with IV Lasix. Much improvement. He is not requiring oxygen. Patient is currently on lasix 160 mg po bid, checked with nephrology, will discharge on this dose at home. 5. Atrial flutter- continue by mouth diltiazem and Lopressor. 6. Acute kidney injury on CK D stage V- baseline creatinine 4.5. Nephrology was consulted and patient is currently on IV Lasix. Today creatinine is 5.24 Patient is not a candidate for dialysis. Nephrology has signed off. 7. Chronic thrombocytopenia- last CBC on 10/10/2016 showed platelets of 88,000.   Patient has been accepted for home hospice.    Procedures:  None   Consultations:  Nephrology  Pulmonology   Discharge Exam: Vitals:   08/15/17 2034 08/16/17 0509  BP: 124/89 122/87  Pulse: 65 (!) 56  Resp: (!) 22 (!) 21  Temp: 99.1 F (37.3 C) 97.9 F (36.6 C)  SpO2: 97% 93%    General: Appears in no acute distress Cardiovascular: S1S2 RRR Respiratory: Clear bilaterally  Discharge Instructions   Discharge Instructions    Diet - low sodium heart healthy   Complete by:  As directed    Increase activity slowly   Complete by:  As directed      Allergies as of 08/16/2017      Reactions   Iodinated Diagnostic Agents Rash   Ace Inhibitors Other (See Comments)   Noted (on MAR) to be "allergic," but no reaction was cited/noted   [employer] Other (See Comments)   Noted (on MAR) to be "allergic," but no reaction was cited/noted   Penicillins Rash   Has patient had a PCN reaction causing immediate rash, facial/tongue/throat swelling, SOB or lightheadedness  with hypotension: Yes Has patient had a PCN reaction causing severe rash involving mucus membranes or skin necrosis: Unknown Has patient had a PCN reaction that required hospitalization: Unknown Has patient had a PCN reaction  occurring within the last 10 years: Unknown If all of the above answers are "NO", then may proceed with Cephalosporin use.      Medication List    STOP taking these medications   albuterol 108 (90 Base) MCG/ACT inhaler Commonly known as:  PROVENTIL HFA;VENTOLIN HFA   VITAMIN-B COMPLEX PO     TAKE these medications   aspirin EC 81 MG tablet Take 1 tablet (81 mg total) by mouth 2 (two) times daily.   bisacodyl 5 MG EC tablet Commonly known as:  DULCOLAX Take 1 tablet (5 mg total) by mouth daily as needed for moderate constipation.   Co Q 10 100 MG Caps Take 100 mg by mouth daily.   diltiazem 360 MG 24 hr capsule Commonly known as:  CARDIZEM CD Take 1 capsule (360 mg total) by mouth daily.   doxazosin 1 MG tablet Commonly known as:  CARDURA Take 1 mg by mouth every evening.   ferrous sulfate 325 (65 FE) MG tablet Take 1 tablet (325 mg total) by mouth daily with breakfast.   furosemide 80 MG tablet Commonly known as:  LASIX Take 2 tablets (160 mg total) by mouth 2 (two) times daily. What changed:  how much to take   ipratropium-albuterol 0.5-2.5 (3) MG/3ML Soln Commonly known as:  DUONEB Take 3 mLs by nebulization 2 (two) times daily.   isosorbide mononitrate 60 MG 24 hr tablet Commonly known as:  IMDUR Take 0.5 tablets (30 mg total) daily by mouth.   Melatonin 3 MG Tabs Take 10 mg by mouth at bedtime.   metoprolol tartrate 25 MG tablet Commonly known as:  LOPRESSOR Take 0.5 tablets (12.5 mg total) by mouth 2 (two) times daily.   MULTI VITAMIN PO Take 1 tablet by mouth daily.   Omega 3 1000 MG Caps Take 2,000 mg by mouth daily.   polyethylene glycol packet Commonly known as:  MIRALAX / GLYCOLAX Take 17 g by mouth daily. What changed:  how much to take   Potassium Chloride ER 20 MEQ Tbcr Take 20 mEq by mouth daily.   PROBIOTIC PO Chew 1 tablet (250 million cell) by mouth once a day   senna 8.6 MG Tabs tablet Commonly known as:  SENOKOT Take 1  tablet by mouth every morning.   traMADol 50 MG tablet Commonly known as:  ULTRAM Take 1 tablet (50 mg total) by mouth every 6 (six) hours as needed for moderate pain.   Vitamin D3 5000 units Tabs Take 5,000 Units by mouth daily.      Allergies  Allergen Reactions  . Iodinated Diagnostic Agents Rash  . Ace Inhibitors Other (See Comments)    Noted (on MAR) to be "allergic," but no reaction was cited/noted  . Shellfish-Derived Products Other (See Comments)    Noted (on MAR) to be "allergic," but no reaction was cited/noted  . Penicillins Rash    Has patient had a PCN reaction causing immediate rash, facial/tongue/throat swelling, SOB or lightheadedness with hypotension: Yes Has patient had a PCN reaction causing severe rash involving mucus membranes or skin necrosis: Unknown Has patient had a PCN reaction that required hospitalization: Unknown Has patient had a PCN reaction occurring within the last 10 years: Unknown If all of the above answers are "NO", then may proceed with  Cephalosporin use.    Follow-up Information    [facility] Follow up.   Specialty:  Home Health Services Contact information: [place] [address] [phone]            The results of significant diagnostics from this hospitalization (including imaging, microbiology, ancillary and laboratory) are listed below for reference.    Significant Diagnostic Studies: Dg Chest 1 View  Result Date: 08/13/2017 CLINICAL DATA:  83 year old post right-sided thoracentesis. Subsequent encounter. EXAM: CHEST 1 VIEW COMPARISON:  08/13/2017 chest CT.  08/12/2017 chest x-ray. FINDINGS: Decrease in size of right-sided pleural effusion post thoracentesis. No pneumothorax. Remainder of pulmonary findings without change. Cardiomegaly. Calcified tortuous aorta. Scoliosis. Left shoulder joint degenerative changes. IMPRESSION: Decrease in size of right-sided pleural effusion post  thoracentesis. No pneumothorax. Electronically Signed   By: Genia Del M.D.   On: 08/13/2017 15:27   Dg Chest 2 View  Result Date: 08/08/2017 CLINICAL DATA:  Shortness of breath. EXAM: CHEST  2 VIEW COMPARISON:  Radiograph July 19, 2017. FINDINGS: Stable cardiomegaly and central pulmonary vascular congestion is noted. Atherosclerosis of thoracic aorta is noted. No pneumothorax is noted. Increased bibasilar opacities are noted concerning for pneumonia or edema. Probable mild right pleural effusion is noted. Degenerative changes seen involving the left glenohumeral joint. IMPRESSION: Stable cardiomegaly and central pulmonary vascular congestion. Increased bibasilar opacities are noted concerning for worsening edema or infiltrates, with probable mild right pleural effusion. Electronically Signed   By: Marijo Conception, M.D.   On: 08/08/2017 10:02   Ct Chest Wo Contrast  Result Date: 08/13/2017 CLINICAL DATA:  83 year old male with history of chronic heart failure with preserved ejection fraction and stage 4 chronic kidney disease presenting with progressively worsening dyspnea. EXAM: CT CHEST WITHOUT CONTRAST TECHNIQUE: Multidetector CT imaging of the chest was performed following the standard protocol without IV contrast. COMPARISON:  No priors. FINDINGS: Cardiovascular: Heart size is mildly enlarged with biatrial dilatation. There is no significant pericardial fluid, thickening or pericardial calcification. There is aortic atherosclerosis, as well as atherosclerosis of the great vessels of the mediastinum and the coronary arteries, including calcified atherosclerotic plaque in the left main, left anterior descending and left circumflex coronary arteries. Calcifications of the aortic valve and mitral annulus. Mediastinum/Nodes: No pathologically enlarged mediastinal or hilar lymph nodes. Please note that accurate exclusion of hilar adenopathy is limited on noncontrast CT scans. Esophagus is unremarkable  in appearance. No axillary lymphadenopathy. Lungs/Pleura: Small right pleural effusion. Extensive atelectasis in the right lower lobe which is very poorly aerated; some associated airspace consolidation is also suspected in the right lower lobe. Patchy areas with thickening of the peribronchovascular interstitium are noted throughout the lungs bilaterally, most evident in the left lower lobe with there is also some regional architectural distortion and patchy areas of ground-glass attenuation. In the upper lobes of the lungs bilaterally there are some scattered areas of mild cylindrical bronchiectasis with some septal thickening, peripheral bronchiolectasis and what appears to be some areas of early honeycombing. 4 mm subpleural nodule along the left major fissure in the anterior aspect of the superior segment of the left lower lobe (axial image 57 of series 7), most likely a subpleural lymph node. No other larger more suspicious appearing pulmonary nodules or masses are noted. Upper Abdomen: 2 mm nonobstructive calculus in the upper pole collecting system of the right kidney. Left kidney is incompletely imaged, but there appears to be at least mild if not moderate hydronephrosis. Low-attenuation  lesions in the liver, incompletely characterized but measuring up to 1.7 cm in segment 7, favored to represent cysts. Aortic atherosclerosis. Musculoskeletal: There are no aggressive appearing lytic or blastic lesions noted in the visualized portions of the skeleton. IMPRESSION: 1. There appears to be a combination of atelectasis and airspace consolidation in the right lower lobe concerning for possible pneumonia. This is associated with a small parapneumonic pleural effusion. Additional patchy foci of peribronchovascular airspace disease are noted elsewhere in the lungs, most evident in the left lower lobe. 2. There is a spectrum of findings in the lungs concerning for interstitial lung disease. At this time, findings are  most concerning for potential chronic hypersensitivity pneumonitis. Repeat high-resolution chest CT is suggested in 12 months to assess for temporal changes in the appearance of the lung parenchyma. 3. Cardiomegaly with biatrial dilatation. 4. Aortic atherosclerosis, in addition to left main and 2 vessel coronary artery disease. 5. There are calcifications of the aortic valve and mitral annulus. Echocardiographic correlation for evaluation of potential valvular dysfunction may be warranted if clinically indicated. 6. 2 mm nonobstructive calculus in the upper pole collecting system of the right kidney. Left kidney is incompletely visualized, but there appears to be at least mild if not moderate left-sided hydronephrosis. If there is any clinical concern for obstructive uropathy, further evaluation with noncontrast CT the abdomen and pelvis is suggested to evaluate for potential ureteral obstruction. Aortic Atherosclerosis (ICD10-I70.0). Electronically Signed   By: Vinnie Langton M.D.   On: 08/13/2017 11:03   US Renal  Result Date: 08/09/2017 CLINICAL DATA:  Acute kidney injury EXAM: RENAL / URINARY TRACT ULTRASOUND COMPLETE COMPARISON:  Ultrasound 10/03/2016 FINDINGS: Right Kidney: Length: 8.7 cm. 1.6 cm cyst in the upper pole which appears simple. Increased echotexture. No hydronephrosis. Left Kidney: Length: 8.6 cm.  Increased echotexture.  No hydronephrosis. Bladder: Appears normal for degree of bladder distention. IMPRESSION: Increased echotexture in the kidneys bilaterally compatible with chronic medical renal disease. No hydronephrosis. Electronically Signed   By: Rolm Baptise M.D.   On: 08/09/2017 18:08   Nm Pulmonary Perf And Vent  Result Date: 07/20/2017 CLINICAL DATA:  Positive D-dimer EXAM: NUCLEAR MEDICINE VENTILATION - PERFUSION LUNG SCAN TECHNIQUE: Ventilation images were obtained in multiple projections using inhaled aerosol Tc-75m DTPA. Perfusion images were obtained in multiple projections  after intravenous injection of Tc-79m MAA. RADIOPHARMACEUTICALS:  32 mCi Technetium-73m DTPA aerosol inhalation and 4.2 mCi Technetium-2m MAA IV COMPARISON:  05/16/2017 and chest x-ray from previous day FINDINGS: Ventilation: The overall ventilation is decrease when compared with the prior exam although no large defect is seen. Some central trapping is noted which can be seen with COPD. Perfusion: Perfusion imaging demonstrates some slight decrease activity in the bases bilaterally related to infiltrative changes seen on the recent chest x-ray. No definitive undulation perfusion mismatch is noted to suggest pulmonary embolism. IMPRESSION: Slight decreased ventilation and perfusion in the bases bilaterally related to recent infiltrates on chest x-ray. No definitive ventilation-perfusion mismatch is noted to suggest pulmonary embolism. Electronically Signed   By: Inez Catalina M.D.   On: 07/20/2017 14:58   Dg Chest Port 1 View  Result Date: 08/12/2017 CLINICAL DATA:  Weakness EXAM: PORTABLE CHEST 1 VIEW COMPARISON:  08/08/2017 FINDINGS: Cardiomegaly with vascular congestion. Bilateral airspace opacities are noted, most confluent in the lung bases. Small right pleural effusion. No acute bony abnormality. IMPRESSION: Cardiomegaly with vascular congestion. Stable bibasilar opacities and small right effusion. Opacities could represent edema or infection. Electronically Signed   By:  Rolm Baptise M.D.   On: 08/12/2017 09:43   Dg Chest Portable 1 View  Result Date: 07/19/2017 CLINICAL DATA:  Acute shortness of breath and wheezing. EXAM: PORTABLE CHEST 1 VIEW COMPARISON:  06/15/2017 and prior chest radiograph FINDINGS: Cardiomegaly and pulmonary vascular congestion noted Increased bilateral lower lung opacities/ airspace disease noted. Probable trace right pleural effusion noted. There is no evidence of pneumothorax or acute bony abnormality. IMPRESSION: Increasing bilateral lower lung opacities -question atelectasis  and/or airspace disease/ pneumonia. Probable trace right pleural effusion. Cardiomegaly and pulmonary vascular congestion. Electronically Signed   By: Margarette Canada M.D.   On: 07/19/2017 16:10   US Thoracentesis Asp Pleural Space W/img Guide  Result Date: 08/13/2017 INDICATION: History of healthcare acquired pneumonia with a small right-sided pleural effusion noted on recent imaging. Request is made for diagnostic and therapeutic thoracentesis. EXAM: ULTRASOUND GUIDED DIAGNOSTIC AND THERAPEUTIC THORACENTESIS MEDICATIONS: 1% lidocaine COMPLICATIONS: None immediate. PROCEDURE: An ultrasound guided thoracentesis was thoroughly discussed with the patient and questions answered. The benefits, risks, alternatives and complications were also discussed. The patient understands and wishes to proceed with the procedure. Written consent was obtained. Ultrasound was performed to localize and mark an adequate pocket of fluid in the right chest. The area was then prepped and draped in the normal sterile fashion. 1% Lidocaine was used for local anesthesia. Under ultrasound guidance a Safe-T-Centesis catheter was introduced. Thoracentesis was performed. The catheter was removed and a dressing applied. FINDINGS: A total of approximately 0.35 L of serosanguineous fluid was removed. Samples were sent to the laboratory as requested by the clinical team. IMPRESSION: Successful ultrasound guided right thoracentesis yielding 0.35 L of pleural fluid. Read by: Saverio Danker, PA-C Electronically Signed   By: Sandi Mariscal M.D.   On: 08/13/2017 16:46    Microbiology: Recent Results (from the past 240 hour(s))  Culture, blood (Routine X 2) w Reflex to ID Panel     Status: None   Collection Time: 08/08/17  9:11 AM  Result Value Ref Range Status   Specimen Description BLOOD BLOOD LEFT HAND  Final   Special Requests   Final    BOTTLES DRAWN AEROBIC AND ANAEROBIC Blood Culture adequate volume   Culture   Final    NO GROWTH 5  DAYS Performed at [facility], [address], [place], [place] [address]    Report Status 08/13/2017 FINAL  Final  Culture, blood (Routine X 2) w Reflex to ID Panel     Status: None   Collection Time: 08/08/17  9:16 AM  Result Value Ref Range Status   Specimen Description BLOOD BLOOD LEFT ARM  Final   Special Requests   Final    BOTTLES DRAWN AEROBIC AND ANAEROBIC Blood Culture adequate volume   Culture   Final    NO GROWTH 5 DAYS Performed at [facility], [place] [address] [place],  [address]    Report Status 08/13/2017 FINAL  Final  Culture, body fluid-bottle     Status: None (Preliminary result)   Collection Time: 08/13/17  3:11 PM  Result Value Ref Range Status   Specimen Description FLUID PLEURAL RIGHT  Final   Special Requests BOTTLES DRAWN AEROBIC AND ANAEROBIC  Final   Culture   Final    NO GROWTH 2 DAYS Performed at [facility], [place] [address], [place],  [address]    Report Status PENDING  Incomplete  Gram stain     Status: None   Collection Time:  08/13/17  3:11 PM  Result Value Ref Range Status   Specimen Description FLUID PLEURAL RIGHT  Final   Special Requests NONE  Final   Gram Stain   Final    FEW WBC PRESENT, PREDOMINANTLY MONONUCLEAR NO ORGANISMS SEEN Performed at [facility], [address], [place], [place] [address]    Report Status 08/13/2017 FINAL  Final     Labs: Basic Metabolic Panel: Recent Labs  Lab 08/12/17 0505 08/13/17 0426 08/14/17 1003 08/15/17 0511 08/16/17 0441  NA 140 140 140 140 141  K 3.5 3.2* 4.1 3.7 3.8  CL 100* 97* 95* 93* 92*  CO2 28 30 28  32 35*  GLUCOSE 133* 139* 240* 150* 151*  BUN 141* 144* 141* 147* 162*  CREATININE 5.83* 5.51* 5.45* 5.24* 5.66*  CALCIUM 8.7* 9.1 9.3 9.2 9.2   Liver Function Tests: No results for input(s): AST, ALT, ALKPHOS, BILITOT, PROT, ALBUMIN in the last 168 hours. No results for input(s): LIPASE, AMYLASE in the last 168 hours. No results for input(s):  AMMONIA in the last 168 hours. CBC: Recent Labs  Lab 08/10/17 0444  WBC 8.0  HGB 10.3*  HCT 29.8*  MCV 81.6  PLT 88*   Cardiac Enzymes: No results for input(s): CKTOTAL, CKMB, CKMBINDEX, TROPONINI in the last 168 hours. BNP: BNP (last 3 results) Recent Labs    06/07/17 1022 07/19/17 1828 08/08/17 0911  BNP 409.3* 2,324.6* 749.9*    ProBNP (last 3 results) Recent Labs    08/20/16 1221 12/22/16 1453  PROBNP 373.0* 999.0*    CBG: No results for input(s): GLUCAP in the last 168 hours.     Signed:  Oswald Hillock MD.  [facility] 08/16/2017, 10:32 AM

## 2017-08-18 LAB — CULTURE, BODY FLUID-BOTTLE: CULTURE: NO GROWTH

## 2017-08-27 DIAGNOSIS — I509 Heart failure, unspecified: Secondary | ICD-10-CM | POA: Diagnosis not present

## 2017-08-27 DIAGNOSIS — N184 Chronic kidney disease, stage 4 (severe): Secondary | ICD-10-CM | POA: Diagnosis not present

## 2017-08-27 DIAGNOSIS — R627 Adult failure to thrive: Secondary | ICD-10-CM | POA: Diagnosis not present

## 2017-09-18 DEATH — deceased

## 2017-12-11 LAB — BLOOD GAS, ARTERIAL
ACID-BASE DEFICIT: 2.8 mmol/L — AB (ref 0.0–2.0)
BICARBONATE: 21.8 mmol/L (ref 20.0–28.0)
DRAWN BY: 331471
O2 CONTENT: 2 L/min
O2 Saturation: 93.7 %
PATIENT TEMPERATURE: 98.6
PO2 ART: 74 mmHg — AB (ref 83.0–108.0)
pCO2 arterial: 39.8 mmHg (ref 32.0–48.0)
pH, Arterial: 7.359 (ref 7.350–7.450)

## 2018-07-17 IMAGING — DX DG CHEST 1V PORT
1 series · 1 of 1 positions shown · non-contrast
Comparison: 06/07/2017

CLINICAL DATA: Tachycardia

EXAM:
PORTABLE CHEST 1 VIEW

[chest ap]
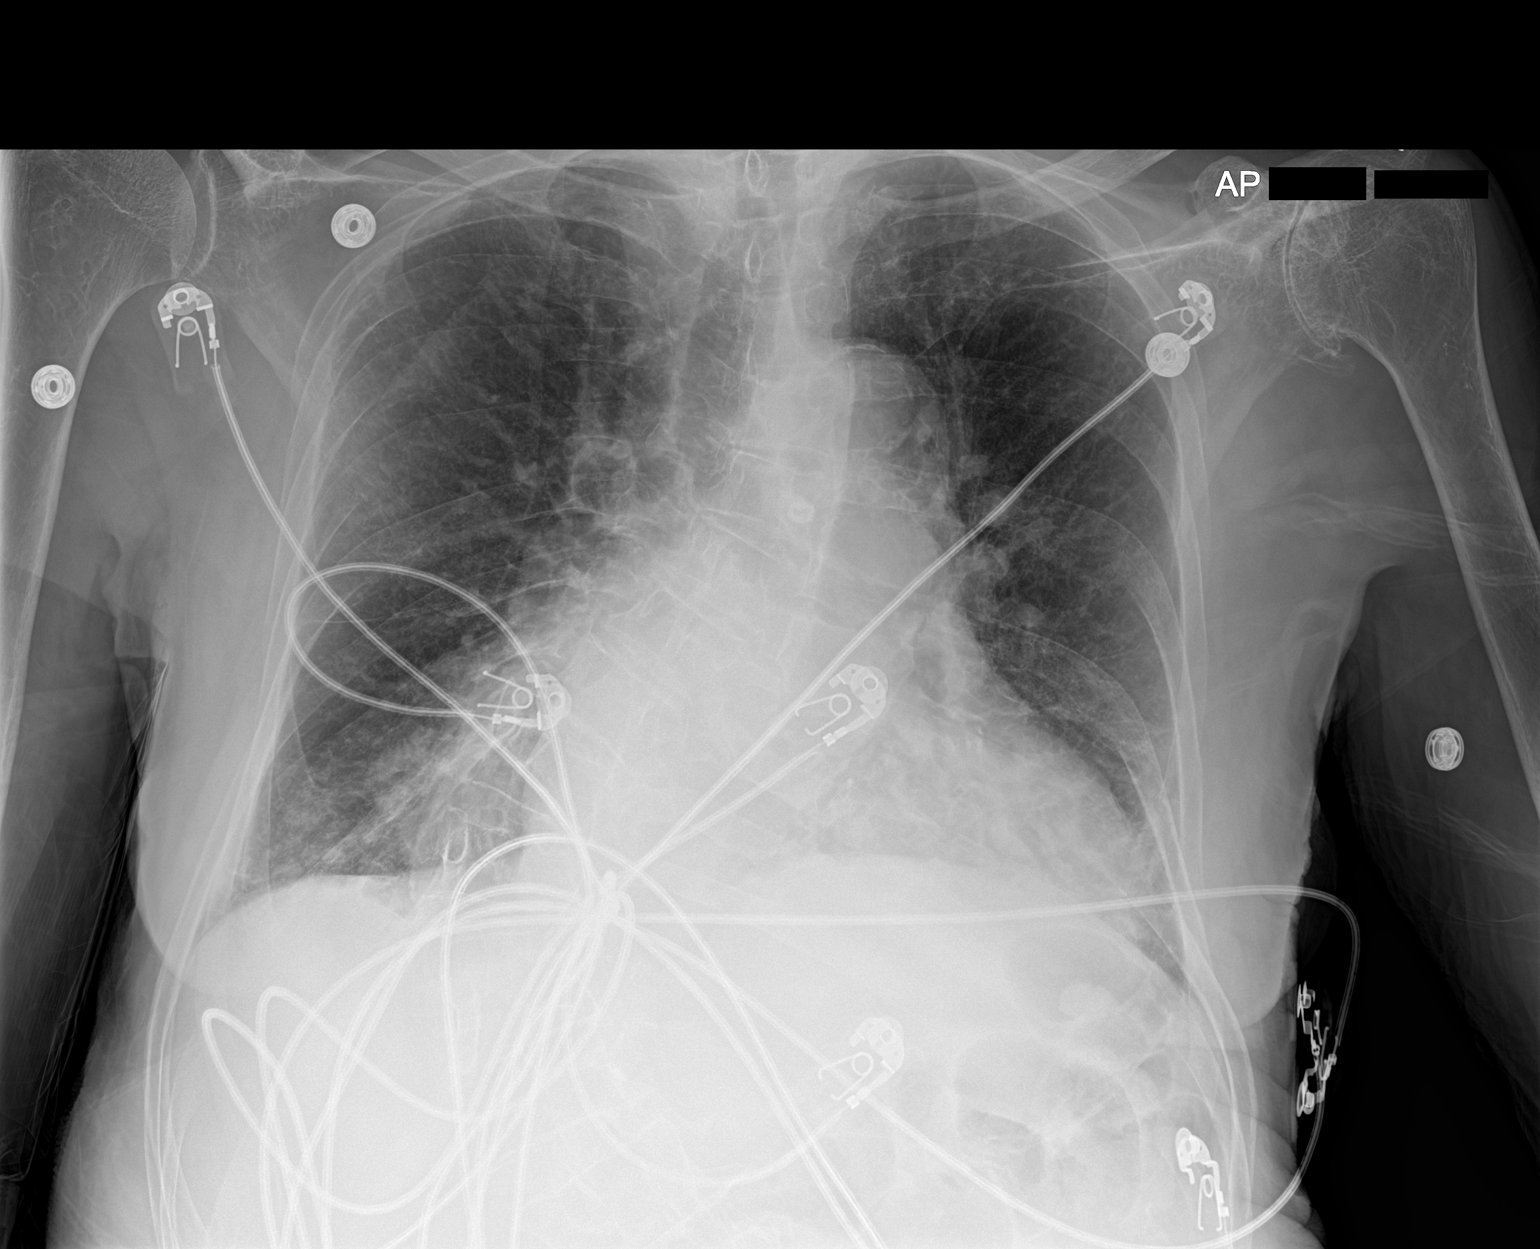

[1 of 1 positions shown; findings below may reference images not displayed]

FINDINGS: Prior interstitial prominence has improved/resolved, suggesting
resolution of interstitial edema.

Mild patchy bilateral lower lobe opacities, likely atelectasis. No
definite pleural effusions. No pneumothorax.

Cardiomegaly.

Midthoracic dextroscoliosis.
IMPRESSION: Prior interstitial edema has resolved.

Mild patchy bilateral lower lobe opacities, likely atelectasis.

## 2018-08-20 IMAGING — DX DG CHEST 1V PORT
1 series · 1 of 1 positions shown · non-contrast
Comparison: 06/15/2017 and prior chest radiograph

CLINICAL DATA: Acute shortness of breath and wheezing.

EXAM:
PORTABLE CHEST 1 VIEW

[chest ap]
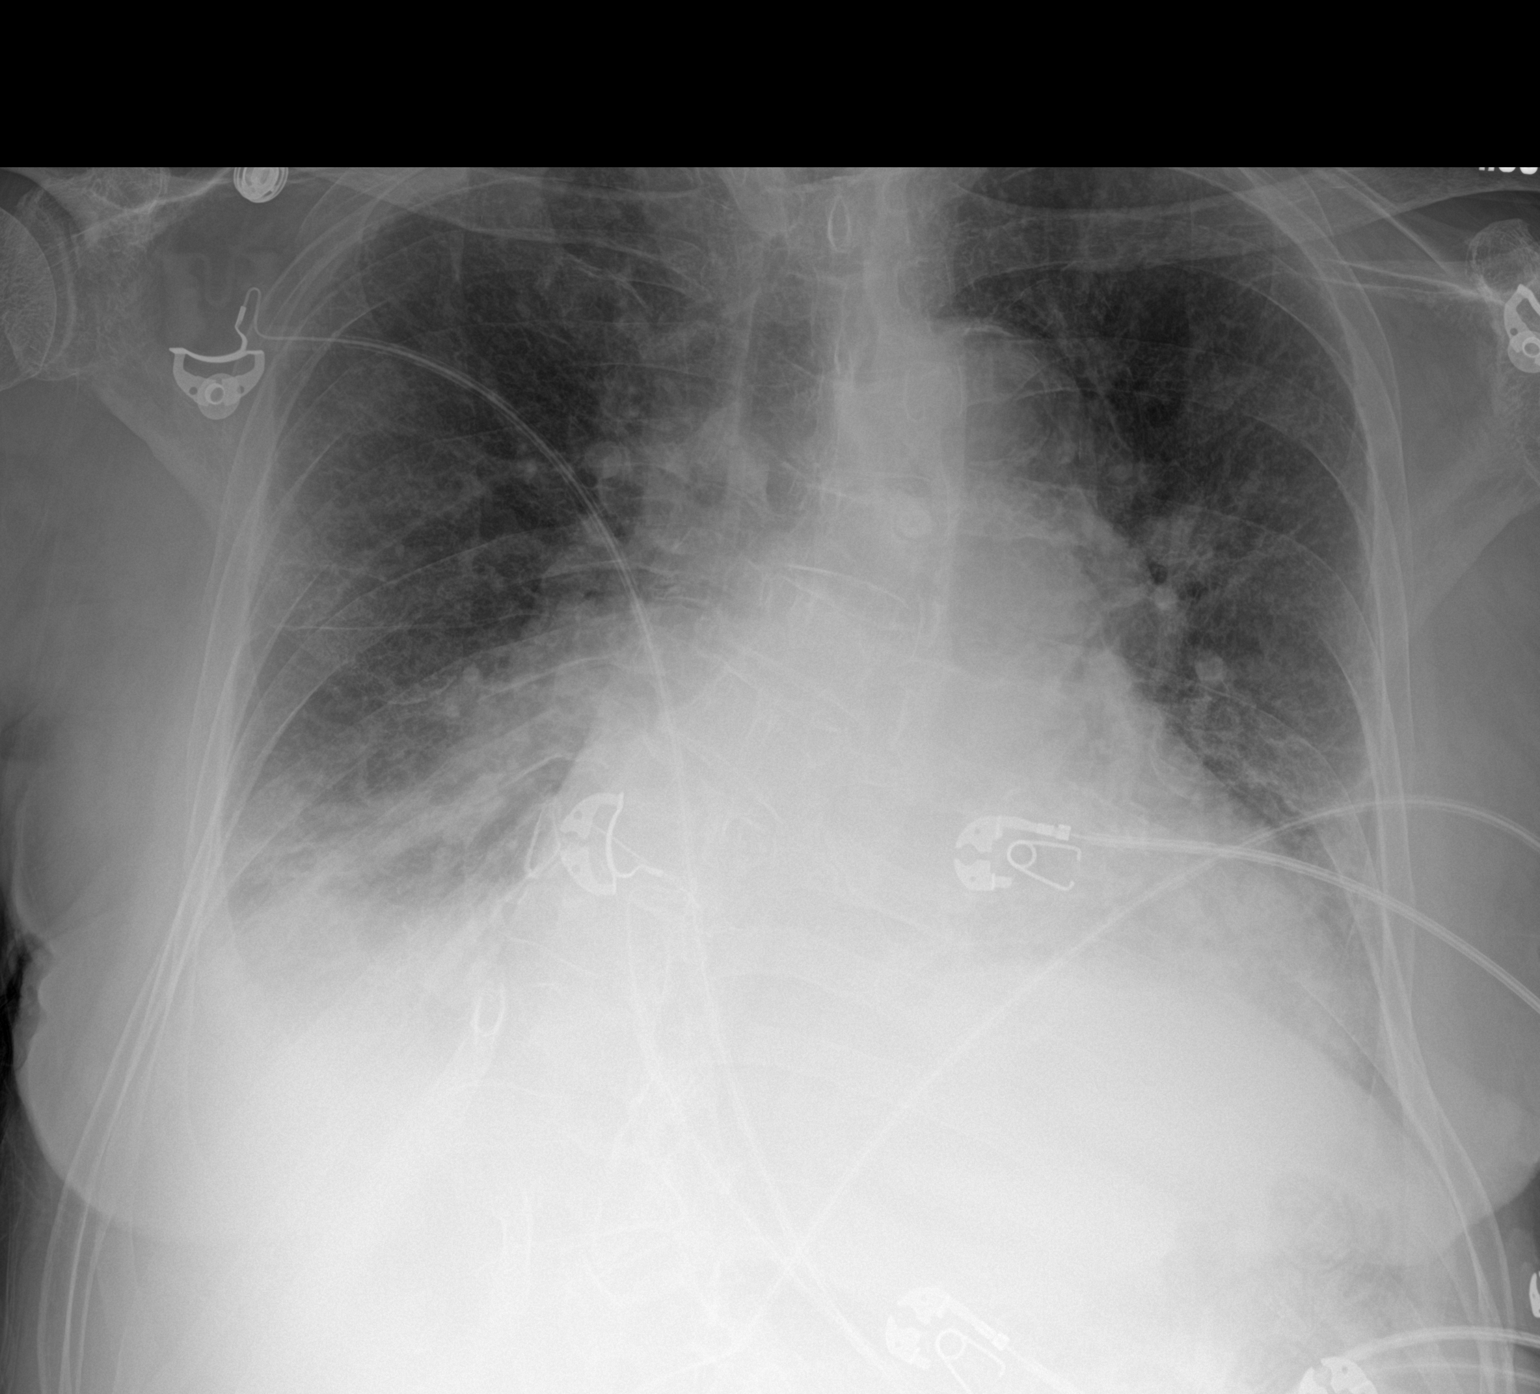

[1 of 1 positions shown; findings below may reference images not displayed]

FINDINGS: Cardiomegaly and pulmonary vascular congestion noted

Increased bilateral lower lung opacities/ airspace disease noted.
Probable trace right pleural effusion noted.

There is no evidence of pneumothorax or acute bony abnormality.
IMPRESSION: Increasing bilateral lower lung opacities -question atelectasis
and/or airspace disease/ pneumonia. Probable trace right pleural
effusion.

Cardiomegaly and pulmonary vascular congestion.

## 2018-08-21 IMAGING — NM NM PULMONARY VENT & PERF
16 series · 16 of 16 positions shown · non-contrast
Comparison: 05/16/2017 and chest x-ray from previous day

CLINICAL DATA: Positive D-dimer

EXAM:
NUCLEAR MEDICINE VENTILATION - PERFUSION LUNG SCAN
TECHNIQUE: Ventilation images were obtained in multiple projections using
inhaled aerosol Sc-BBm DTPA. Perfusion images were obtained in
multiple projections after intravenous injection of Sc-BBm MAA.
RADIOPHARMACEUTICALS:  32 mCi Aechnetium-SSm DTPA aerosol inhalation
and 4.2 mCi Aechnetium-SSm MAA IV

[Series 1: ant/post vent · 4.14mm/px · 1 of 1 slices shown (1 of 2)]
[im 1/1]
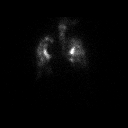

[Series 1: ant/post vent · 4.14mm/px · 1 of 1 slices shown (2 of 2)]
[im 1/1]
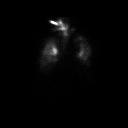

[Series 2: lao/rpo vent · 4.14mm/px · 1 of 1 slices shown (1 of 2)]
[im 1/1]
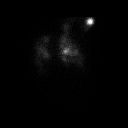

[Series 2: lao/rpo vent · 4.14mm/px · 1 of 1 slices shown (2 of 2)]
[im 1/1]
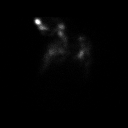

[Series 3: lpo/rao vent · 4.14mm/px · 1 of 1 slices shown (1 of 2)]
[im 1/1]
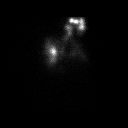

[Series 3: lpo/rao vent · 4.14mm/px · 1 of 1 slices shown (2 of 2)]
[im 1/1]
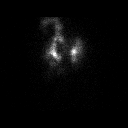

[Series 4: lt lat/rt lat vent · 4.14mm/px · 1 of 1 slices shown (1 of 2)]
[im 1/1]
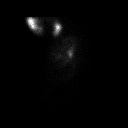

[Series 4: lt lat/rt lat vent · 4.14mm/px · 1 of 1 slices shown (2 of 2)]
[im 1/1]
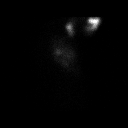

[Series 5: lt lat/rt lat perf · 4.14mm/px · 1 of 1 slices shown (1 of 2)]
[im 1/1]
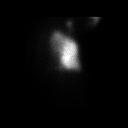

[Series 5: lt lat/rt lat perf · 4.14mm/px · 1 of 1 slices shown (2 of 2)]
[im 1/1]
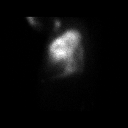

[Series 6: lpo/rao perf · 4.14mm/px · 1 of 1 slices shown (1 of 2)]
[im 1/1]
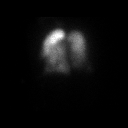

[Series 6: lpo/rao perf · 4.14mm/px · 1 of 1 slices shown (2 of 2)]
[im 1/1]
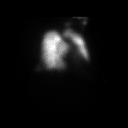

[Series 7: ant/post perf · 4.14mm/px · 1 of 1 slices shown (1 of 2)]
[im 1/1]
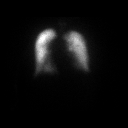

[Series 7: ant/post perf · 4.14mm/px · 1 of 1 slices shown (2 of 2)]
[im 1/1]
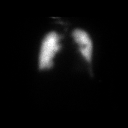

[Series 8: lao/rpo perf · 4.14mm/px · 1 of 1 slices shown (1 of 2)]
[im 1/1]
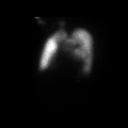

[Series 8: lao/rpo perf · 4.14mm/px · 1 of 1 slices shown (2 of 2)]
[im 1/1]
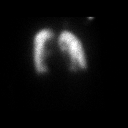

[16 of 16 positions shown; findings below may reference images not displayed]

FINDINGS: Ventilation: The overall ventilation is decrease when compared with
the prior exam although no large defect is seen. Some central
trapping is noted which can be seen with COPD.

Perfusion: Perfusion imaging demonstrates some slight decrease
activity in the bases bilaterally related to infiltrative changes
seen on the recent chest x-ray. No definitive undulation perfusion
mismatch is noted to suggest pulmonary embolism.
IMPRESSION: Slight decreased ventilation and perfusion in the bases bilaterally
related to recent infiltrates on chest x-ray. No definitive
ventilation-perfusion mismatch is noted to suggest pulmonary
embolism.
